# Patient Record
Sex: Female | Born: 1980 | Race: White | Hispanic: No | Marital: Married | Smoking: Former smoker
Health system: Southern US, Community
[De-identification: ages and names within clinical notes are randomized; demographics above are authoritative.]

## PROBLEM LIST (undated history)

## (undated) DIAGNOSIS — F4323 Adjustment disorder with mixed anxiety and depressed mood: Secondary | ICD-10-CM

## (undated) DIAGNOSIS — F32A Depression, unspecified: Secondary | ICD-10-CM

## (undated) DIAGNOSIS — D649 Anemia, unspecified: Secondary | ICD-10-CM

## (undated) DIAGNOSIS — E079 Disorder of thyroid, unspecified: Secondary | ICD-10-CM

## (undated) DIAGNOSIS — J45909 Unspecified asthma, uncomplicated: Secondary | ICD-10-CM

## (undated) DIAGNOSIS — E039 Hypothyroidism, unspecified: Secondary | ICD-10-CM

## (undated) DIAGNOSIS — O99019 Anemia complicating pregnancy, unspecified trimester: Secondary | ICD-10-CM

## (undated) DIAGNOSIS — F329 Major depressive disorder, single episode, unspecified: Secondary | ICD-10-CM

## (undated) DIAGNOSIS — O99283 Endocrine, nutritional and metabolic diseases complicating pregnancy, third trimester: Secondary | ICD-10-CM

## (undated) DIAGNOSIS — Z872 Personal history of diseases of the skin and subcutaneous tissue: Secondary | ICD-10-CM

## (undated) DIAGNOSIS — O411290 Chorioamnionitis, unspecified trimester, not applicable or unspecified: Secondary | ICD-10-CM

## (undated) HISTORY — DX: Anemia, unspecified: D64.9

## (undated) HISTORY — DX: Disorder of thyroid, unspecified: E07.9

## (undated) HISTORY — DX: Endocrine, nutritional and metabolic diseases complicating pregnancy, third trimester: O99.283

---
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
## 2004-03-22 ENCOUNTER — Emergency Department (HOSPITAL_COMMUNITY): Admission: EM | Admit: 2004-03-22 | Discharge: 2004-03-23 | Payer: Self-pay | Admitting: Emergency Medicine

## 2006-02-05 ENCOUNTER — Emergency Department (HOSPITAL_COMMUNITY): Admission: EM | Admit: 2006-02-05 | Discharge: 2006-02-06 | Payer: Self-pay | Admitting: Emergency Medicine

## 2007-06-25 ENCOUNTER — Other Ambulatory Visit: Admission: RE | Admit: 2007-06-25 | Discharge: 2007-06-25 | Payer: Self-pay | Admitting: Obstetrics and Gynecology

## 2012-02-14 ENCOUNTER — Emergency Department (HOSPITAL_COMMUNITY): Payer: 59

## 2012-02-14 ENCOUNTER — Emergency Department (HOSPITAL_COMMUNITY)
Admission: EM | Admit: 2012-02-14 | Discharge: 2012-02-15 | Disposition: A | Discharge status: Home / Self Care | Payer: 59 | Attending: Emergency Medicine | Admitting: Emergency Medicine

## 2012-02-14 ENCOUNTER — Encounter (HOSPITAL_COMMUNITY): Payer: Self-pay | Admitting: Emergency Medicine

## 2012-02-14 DIAGNOSIS — F17200 Nicotine dependence, unspecified, uncomplicated: Secondary | ICD-10-CM | POA: Insufficient documentation

## 2012-02-14 DIAGNOSIS — Z79899 Other long term (current) drug therapy: Secondary | ICD-10-CM | POA: Insufficient documentation

## 2012-02-14 DIAGNOSIS — Z3202 Encounter for pregnancy test, result negative: Secondary | ICD-10-CM | POA: Insufficient documentation

## 2012-02-14 DIAGNOSIS — J45909 Unspecified asthma, uncomplicated: Secondary | ICD-10-CM | POA: Insufficient documentation

## 2012-02-14 DIAGNOSIS — R109 Unspecified abdominal pain: Secondary | ICD-10-CM

## 2012-02-14 DIAGNOSIS — F329 Major depressive disorder, single episode, unspecified: Secondary | ICD-10-CM | POA: Insufficient documentation

## 2012-02-14 DIAGNOSIS — F3289 Other specified depressive episodes: Secondary | ICD-10-CM | POA: Insufficient documentation

## 2012-02-14 DIAGNOSIS — R1031 Right lower quadrant pain: Secondary | ICD-10-CM | POA: Insufficient documentation

## 2012-02-14 HISTORY — DX: Depression, unspecified: F32.A

## 2012-02-14 HISTORY — DX: Unspecified asthma, uncomplicated: J45.909

## 2012-02-14 HISTORY — DX: Major depressive disorder, single episode, unspecified: F32.9

## 2012-02-14 LAB — URINALYSIS, MICROSCOPIC ONLY
Glucose, UA: NEGATIVE mg/dL
Leukocytes, UA: NEGATIVE
Nitrite: NEGATIVE
Specific Gravity, Urine: 1.009 (ref 1.005–1.030)
pH: 6.5 (ref 5.0–8.0)

## 2012-02-14 LAB — CBC WITH DIFFERENTIAL/PLATELET
Eosinophils Relative: 1 % (ref 0–5)
HCT: 39.7 % (ref 36.0–46.0)
MCHC: 33.8 g/dL (ref 30.0–36.0)
Monocytes Absolute: 0.8 10*3/uL (ref 0.1–1.0)
Neutro Abs: 5.5 10*3/uL (ref 1.7–7.7)
RDW: 12.1 % (ref 11.5–15.5)

## 2012-02-14 LAB — BASIC METABOLIC PANEL
BUN: 18 mg/dL (ref 6–23)
Calcium: 9 mg/dL (ref 8.4–10.5)
Creatinine, Ser: 0.64 mg/dL (ref 0.50–1.10)
GFR calc Af Amer: >90 mL/min (ref >90)
GFR calc non Af Amer: >90 mL/min (ref >90)

## 2012-02-14 LAB — WET PREP, GENITAL: Yeast Wet Prep HPF POC: NONE SEEN

## 2012-02-14 LAB — LIPASE, BLOOD: Lipase: 28 U/L (ref 11–59)

## 2012-02-14 LAB — POCT PREGNANCY, URINE: Preg Test, Ur: NEGATIVE

## 2012-02-14 MED ORDER — SODIUM CHLORIDE 0.9 % IV BOLUS (SEPSIS)
500.0000 mL | Freq: Once | INTRAVENOUS | Status: AC
  Administered 2012-02-14: 500 mL via INTRAVENOUS

## 2012-02-14 MED ORDER — SODIUM CHLORIDE 0.9 % IV SOLN
INTRAVENOUS | Status: DC
  Administered 2012-02-15: via INTRAVENOUS

## 2012-02-14 NOTE — ED Provider Notes (Signed)
History     CSN: [id number]  Arrival date & time 02/14/12  2006   First MD Initiated Contact with Patient 02/14/12 2128      Chief Complaint  Patient presents with  . Abdominal Pain    (Consider location/radiation/quality/duration/timing/severity/associated sxs/prior treatment) The history is provided by the patient.   patient presents with abdominal pain that began 2:00 today. It is dull. No dysuria. She was seen in urgent care. She was told that her urine did not show anything. She was not sent with paperwork. She was tender in her right lower abdomen at the time. She still somewhat tender but is improved. No fevers. No change in appetite. No diarrhea constipation. No nausea vomiting. Patient's last period was a week ago. She had not had periods for a while for that because she was on a double shot. She denies pregnancy but also states it is a possibility.   Past Medical History  Diagnosis Date  . Asthma   . Depression     Past Surgical History  Procedure Date  . Hernia repair   . Tonsillectomy     No family history on file.  History  Substance Use Topics  . Smoking status: Current Some Day Smoker  . Smokeless tobacco: Not on file  . Alcohol Use: No    OB History    Grav Para Term Preterm Abortions TAB SAB Ect Mult Living                  Review of Systems  Constitutional: Negative for activity change and appetite change.  HENT: Negative for neck stiffness.   Eyes: Negative for pain.  Respiratory: Negative for chest tightness and shortness of breath.   Cardiovascular: Negative for chest pain and leg swelling.  Gastrointestinal: Positive for abdominal pain. Negative for nausea, vomiting and diarrhea.  Genitourinary: Negative for flank pain and vaginal discharge.  Musculoskeletal: Negative for back pain.  Skin: Negative for rash.  Neurological: Negative for weakness, numbness and headaches.  Psychiatric/Behavioral: Negative for behavioral problems.     Allergies  Review of patient's allergies indicates no known allergies.  Home Medications   Current Outpatient Rx  Name  Route  Sig  Dispense  Refill  . CETIRIZINE HCL 10 MG PO TABS   Oral   Take 10 mg by mouth daily.         [name] Kitchen ESCITALOPRAM OXALATE 20 MG PO TABS   Oral   Take 20 mg by mouth daily.         . IBUPROFEN 200 MG PO TABS   Oral   Take 800 mg by mouth every 6 (six) hours as needed. Pain         . MONTELUKAST SODIUM 10 MG PO TABS   Oral   Take 10 mg by mouth at bedtime.         Azzie Roup ACE-ETH ESTRAD-FE 1-20 MG-MCG PO TABS   Oral   Take 1 tablet by mouth daily.           BP 118/71  Pulse 98  Temp 98 F (36.7 C) (Oral)  Resp 16  Wt 150 lb (68.04 kg)  SpO2 99%  LMP 02/05/2012  Physical Exam  Nursing note and vitals reviewed. Constitutional: She is oriented to person, place, and time. She appears well-developed and well-nourished.  HENT:  Head: Normocephalic and atraumatic.  Eyes: EOM are normal. Pupils are equal, round, and reactive to light.  Neck: Normal range of motion. Neck supple.  Cardiovascular: Normal rate, regular rhythm and normal heart sounds.   No murmur heard. Pulmonary/Chest: Effort normal and breath sounds normal. No respiratory distress. She has no wheezes. She has no rales.  Abdominal: Soft. Bowel sounds are normal. She exhibits no distension. There is tenderness. There is no rebound and no guarding.       Mild lower abdominal tenderness. No rebound or guarding.  Musculoskeletal: Normal range of motion.  Neurological: She is alert and oriented to person, place, and time. No cranial nerve deficit.  Skin: Skin is warm and dry.  Psychiatric: She has a normal mood and affect. Her speech is normal.  pelvic exam showed no CMT. Mild white vaginal discharge.   ED Course  Procedures (including critical care time)  Labs Reviewed  URINALYSIS, MICROSCOPIC ONLY - Abnormal; Notable for the following:    Hgb urine dipstick TRACE  (*)     Ketones, ur TRACE (*)     Squamous Epithelial / LPF FEW (*)     All other components within normal limits  BASIC METABOLIC PANEL - Abnormal; Notable for the following:    Sodium 132 (*)     All other components within normal limits  WET PREP, GENITAL - Abnormal; Notable for the following:    WBC, Wet Prep HPF POC MANY (*)     All other components within normal limits  CBC WITH DIFFERENTIAL  LIPASE, BLOOD  POCT PREGNANCY, URINE  GC/CHLAMYDIA PROBE AMP   No results found.   No diagnosis found.    MDM  She has right lower abdominal pain that started today. Seen in urgent care and sent here to rule out appendicitis. Laboratories overall reassuring. Patient continues tender. CT scan will be done.         Juliet Rude. Rubin Payor, MD 02/14/12 [phone]

## 2012-02-14 NOTE — ED Notes (Signed)
Patient seen at urgent care at @1900 , was sent here by provider. Patient c/o lower back pain, nausea, abd tenderness. Provider expressed concern over appendicitis or ovarian cysts. Patient was given 2 small white pills and was asked to place them under her tongue (Zofran ODT?) for nausea. Patient reports still having intermittent nausea at this time.

## 2012-02-14 NOTE — ED Notes (Signed)
Pelvic is set up 

## 2012-02-14 NOTE — ED Notes (Signed)
Pt alert, arrives from Urgent Care, c/o lower abd pain, ? Appendicitis, states onset today, pt ? UTI s/s,  resp even unlabored, skin pwd

## 2012-02-15 MED ORDER — IBUPROFEN 800 MG PO TABS: 800.0000 mg | ORAL_TABLET | Freq: Three times a day (TID) | ORAL | Status: DC

## 2012-02-15 MED ORDER — IOHEXOL 300 MG/ML  SOLN
100.0000 mL | Freq: Once | INTRAMUSCULAR | Status: AC | PRN
  Administered 2012-02-15: 100 mL via INTRAVENOUS

## 2012-02-15 NOTE — ED Notes (Signed)
Discharge instructions reviewed. Rx given x1. All questions answered. 

## 2012-02-15 NOTE — ED Notes (Signed)
Patient has completed contrast, CT notified.

## 2012-02-20 LAB — HM PAP SMEAR

## 2012-08-07 ENCOUNTER — Emergency Department (HOSPITAL_COMMUNITY)
Admission: EM | Admit: 2012-08-07 | Discharge: 2012-08-08 | Disposition: A | Discharge status: Home / Self Care | Payer: 59 | Attending: Emergency Medicine | Admitting: Emergency Medicine

## 2012-08-07 ENCOUNTER — Encounter (HOSPITAL_COMMUNITY): Payer: Self-pay | Admitting: Emergency Medicine

## 2012-08-07 DIAGNOSIS — S61219A Laceration without foreign body of unspecified finger without damage to nail, initial encounter: Secondary | ICD-10-CM

## 2012-08-07 DIAGNOSIS — S61209A Unspecified open wound of unspecified finger without damage to nail, initial encounter: Secondary | ICD-10-CM | POA: Insufficient documentation

## 2012-08-07 DIAGNOSIS — F3289 Other specified depressive episodes: Secondary | ICD-10-CM | POA: Insufficient documentation

## 2012-08-07 DIAGNOSIS — W260XXA Contact with knife, initial encounter: Secondary | ICD-10-CM | POA: Insufficient documentation

## 2012-08-07 DIAGNOSIS — Z79899 Other long term (current) drug therapy: Secondary | ICD-10-CM | POA: Insufficient documentation

## 2012-08-07 DIAGNOSIS — J45909 Unspecified asthma, uncomplicated: Secondary | ICD-10-CM | POA: Insufficient documentation

## 2012-08-07 DIAGNOSIS — F329 Major depressive disorder, single episode, unspecified: Secondary | ICD-10-CM | POA: Insufficient documentation

## 2012-08-07 DIAGNOSIS — Y990 Civilian activity done for income or pay: Secondary | ICD-10-CM | POA: Insufficient documentation

## 2012-08-07 DIAGNOSIS — Z23 Encounter for immunization: Secondary | ICD-10-CM | POA: Insufficient documentation

## 2012-08-07 DIAGNOSIS — Y9389 Activity, other specified: Secondary | ICD-10-CM | POA: Insufficient documentation

## 2012-08-07 DIAGNOSIS — Y929 Unspecified place or not applicable: Secondary | ICD-10-CM | POA: Insufficient documentation

## 2012-08-07 DIAGNOSIS — W261XXA Contact with sword or dagger, initial encounter: Secondary | ICD-10-CM | POA: Insufficient documentation

## 2012-08-07 DIAGNOSIS — F17200 Nicotine dependence, unspecified, uncomplicated: Secondary | ICD-10-CM | POA: Insufficient documentation

## 2012-08-07 MED ORDER — TETANUS-DIPHTH-ACELL PERTUSSIS 5-2.5-18.5 LF-MCG/0.5 IM SUSP
0.5000 mL | Freq: Once | INTRAMUSCULAR | Status: AC
  Administered 2012-08-08: 0.5 mL via INTRAMUSCULAR
  Filled 2012-08-07: qty 0.5

## 2012-08-07 NOTE — ED Notes (Signed)
Pt c/o laceration to L middle finger, pt states she accidentally cut self with instrument at work @ 1400 today, minimal bleeding noted.

## 2012-08-07 NOTE — ED Provider Notes (Signed)
History    This chart was scribed for a non-physician practitioner, Roxy Horseman, PA-C, working with Olivia Mackie, MD by Frederik Pear, ED Scribe. This patient was seen in room WTR7/WTR7 and the patient's care was started at 2314.   CSN: [id number]  Arrival date & time 08/07/12  2257   First MD Initiated Contact with Patient 08/07/12 2314      Chief Complaint  Patient presents with  . Laceration    (Consider location/radiation/quality/duration/timing/severity/associated sxs/prior treatment) The history is provided by the patient and medical records. No language interpreter was used.    HPI Comments: Megan Lara is a 32 y.o. female who presents to the Emergency Department complaining of a laceration to the left middle finger that occurred at 1400 when she slipped while using a blade for cutting paraffin while at work. In ED, bleeding is controlled. She denies any other associated symptoms. She reports that her last tetanus was more than 5 years ago.  Past Medical History  Diagnosis Date  . Asthma   . Depression     Past Surgical History  Procedure Laterality Date  . Hernia repair    . Tonsillectomy      No family history on file.  History  Substance Use Topics  . Smoking status: Current Some Day Smoker  . Smokeless tobacco: Not on file  . Alcohol Use: Yes     Comment: occ    OB History   Grav Para Term Preterm Abortions TAB SAB Ect Mult Living                  Review of Systems A complete 10 system review of systems was obtained and all systems are negative except as noted in the HPI and PMH.  Allergies  Review of patient's allergies indicates no known allergies.  Home Medications   Current Outpatient Rx  Name  Route  Sig  Dispense  Refill  . cetirizine (ZYRTEC) 10 MG tablet   Oral   Take 10 mg by mouth daily.         [name] Kitchen escitalopram (LEXAPRO) 20 MG tablet   Oral   Take 20 mg by mouth daily.         [name] Kitchen ibuprofen (ADVIL,MOTRIN) 200 MG tablet  Oral   Take 800 mg by mouth every 6 (six) hours as needed. Pain         . ibuprofen (ADVIL,MOTRIN) 800 MG tablet   Oral   Take 1 tablet (800 mg total) by mouth 3 (three) times daily.   21 tablet   0   . montelukast (SINGULAIR) 10 MG tablet   Oral   Take 10 mg by mouth at bedtime.         . norethindrone-ethinyl estradiol (JUNEL FE,GILDESS FE,LOESTRIN FE) 1-20 MG-MCG tablet   Oral   Take 1 tablet by mouth daily.           BP 128/77  Pulse 109  Temp(Src) 98.7 F (37.1 C) (Oral)  Resp 16  Wt 135 lb (61.236 kg)  SpO2 99%  LMP 07/07/2012  Physical Exam  Nursing note and vitals reviewed. Constitutional: She is oriented to person, place, and time. She appears well-developed and well-nourished. No distress.  HENT:  Head: Normocephalic and atraumatic.  Eyes: EOM are normal. Pupils are equal, round, and reactive to light.  Neck: Normal range of motion. Neck supple. No tracheal deviation present.  Cardiovascular: Normal rate.   Pulmonary/Chest: Effort normal. No respiratory distress.  Abdominal: Soft. She  exhibits no distension.  Musculoskeletal: Normal range of motion. She exhibits no edema.  Neurological: She is alert and oriented to person, place, and time.  Skin: Skin is warm and dry. Laceration noted.  0.5 cm very shallow, hemostatic laceration to the distal aspect o the palmar surface of the left third digit. No foreign bodies. No tendon or ligament involvement.  Psychiatric: She has a normal mood and affect. Her behavior is normal.    ED Course  Procedures (including critical care time)  DIAGNOSTIC STUDIES: Oxygen Saturation is 99% on room air, normal by my interpretation.    COORDINATION OF CARE:  23:35- Discussed planned course of treatment with the patient, including updating her tetanus and repairing the laceration with Dermabond, who is agreeable at this time.  23:45- Medication Orders- TDaP (boostrix) injection 0.5 ml- once.  Labs Reviewed - No data to  display No results found. LACERATION REPAIR Performed by: Roxy Horseman Authorized by: Roxy Horseman Consent: Verbal consent obtained. Risks and benefits: risks, benefits and alternatives were discussed Consent given by: patient Patient identity confirmed: provided demographic data Prepped and Draped in normal sterile fashion Wound explored  Laceration Location: Left middle finger  Laceration Length: 0.5 cm  No Foreign Bodies seen or palpated  Anesthesia: None   Local anesthetic: None   Anesthetic total: None   Irrigation method: syringe Amount of cleaning: standard  Skin closure: Dermabond   Number of sutures: Dermabond   Technique: Dermabond   Patient tolerance: Patient tolerated the procedure well with no immediate complications.   1. Finger laceration, initial encounter       MDM  Patient with uncomplicated laceration. Repaired with Dermabond. Tetanus is updated. Patient is stable and ready for discharge.  I personally performed the services described in this documentation, which was scribed in my presence. The recorded information has been reviewed and is accurate.         Roxy Horseman, PA-C 08/08/12 0015

## 2012-08-08 NOTE — ED Provider Notes (Signed)
Medical screening examination/treatment/procedure(s) were performed by non-physician practitioner and as supervising physician I was immediately available for consultation/collaboration.  Olivia Mackie, MD 08/08/12 [phone]

## 2012-09-12 ENCOUNTER — Emergency Department (HOSPITAL_COMMUNITY)
Admission: EM | Admit: 2012-09-12 | Discharge: 2012-09-12 | Disposition: A | Discharge status: Home / Self Care | Payer: 59 | Attending: Emergency Medicine | Admitting: Emergency Medicine

## 2012-09-12 ENCOUNTER — Other Ambulatory Visit: Payer: Self-pay

## 2012-09-12 ENCOUNTER — Encounter (HOSPITAL_COMMUNITY): Payer: Self-pay | Admitting: Emergency Medicine

## 2012-09-12 ENCOUNTER — Emergency Department (HOSPITAL_COMMUNITY): Payer: 59

## 2012-09-12 DIAGNOSIS — F3289 Other specified depressive episodes: Secondary | ICD-10-CM | POA: Insufficient documentation

## 2012-09-12 DIAGNOSIS — Z79899 Other long term (current) drug therapy: Secondary | ICD-10-CM | POA: Insufficient documentation

## 2012-09-12 DIAGNOSIS — F17200 Nicotine dependence, unspecified, uncomplicated: Secondary | ICD-10-CM | POA: Insufficient documentation

## 2012-09-12 DIAGNOSIS — Z3202 Encounter for pregnancy test, result negative: Secondary | ICD-10-CM | POA: Insufficient documentation

## 2012-09-12 DIAGNOSIS — R071 Chest pain on breathing: Secondary | ICD-10-CM | POA: Insufficient documentation

## 2012-09-12 DIAGNOSIS — J45909 Unspecified asthma, uncomplicated: Secondary | ICD-10-CM | POA: Insufficient documentation

## 2012-09-12 DIAGNOSIS — F329 Major depressive disorder, single episode, unspecified: Secondary | ICD-10-CM | POA: Insufficient documentation

## 2012-09-12 DIAGNOSIS — R42 Dizziness and giddiness: Secondary | ICD-10-CM | POA: Insufficient documentation

## 2012-09-12 DIAGNOSIS — R0789 Other chest pain: Secondary | ICD-10-CM

## 2012-09-12 LAB — URINALYSIS, ROUTINE W REFLEX MICROSCOPIC
Ketones, ur: NEGATIVE mg/dL
Leukocytes, UA: NEGATIVE
Nitrite: NEGATIVE
Protein, ur: NEGATIVE mg/dL
Urobilinogen, UA: 0.2 mg/dL (ref 0.0–1.0)

## 2012-09-12 LAB — COMPREHENSIVE METABOLIC PANEL
Albumin: 4 g/dL (ref 3.5–5.2)
Alkaline Phosphatase: 59 U/L (ref 39–117)
BUN: 12 mg/dL (ref 6–23)
Chloride: 101 mEq/L (ref 96–112)
Creatinine, Ser: 0.64 mg/dL (ref 0.50–1.10)
GFR calc Af Amer: >90 mL/min (ref >90)
GFR calc non Af Amer: >90 mL/min (ref >90)
Glucose, Bld: 91 mg/dL (ref 70–99)
Potassium: 3.8 mEq/L (ref 3.5–5.1)
Total Bilirubin: 0.4 mg/dL (ref 0.3–1.2)

## 2012-09-12 LAB — CBC
HCT: 42.5 % (ref 36.0–46.0)
Hemoglobin: 14.2 g/dL (ref 12.0–15.0)
MCV: 95.7 fL (ref 78.0–100.0)
RDW: 12.6 % (ref 11.5–15.5)
WBC: 9.1 10*3/uL (ref 4.0–10.5)

## 2012-09-12 LAB — POCT PREGNANCY, URINE: Preg Test, Ur: NEGATIVE

## 2012-09-12 MED ORDER — OMEPRAZOLE 20 MG PO CPDR: 20.0000 mg | DELAYED_RELEASE_CAPSULE | Freq: Every day | ORAL | Status: DC

## 2012-09-12 MED ORDER — OXYCODONE-ACETAMINOPHEN 5-325 MG PO TABS
2.0000 | ORAL_TABLET | Freq: Once | ORAL | Status: AC
  Administered 2012-09-12: 2 via ORAL
  Filled 2012-09-12: qty 2

## 2012-09-12 MED ORDER — SODIUM CHLORIDE 0.9 % IV SOLN
Freq: Once | INTRAVENOUS | Status: AC
  Administered 2012-09-12: 17:00:00 via INTRAVENOUS

## 2012-09-12 MED ORDER — ALBUTEROL SULFATE (5 MG/ML) 0.5% IN NEBU
5.0000 mg | INHALATION_SOLUTION | Freq: Once | RESPIRATORY_TRACT | Status: AC
  Administered 2012-09-12: 5 mg via RESPIRATORY_TRACT
  Filled 2012-09-12: qty 1

## 2012-09-12 NOTE — ED Notes (Signed)
Patient was educated not to drive, operate heavy machinery, or drink alcohol while taking narcotic medication.  

## 2012-09-12 NOTE — ED Notes (Signed)
Patient presents with left chest pressure pain, dizziness, lightheadedness. Denies N/V, SOB, diaphoresis, blurred vision. Patient smokes cigarettes, no other history.

## 2012-09-12 NOTE — ED Provider Notes (Signed)
History    CSN: [id number] Arrival date & time 09/12/12  1633  First MD Initiated Contact with Patient 09/12/12 1636     Chief Complaint  Patient presents with  . Chest Pain   (Consider location/radiation/quality/duration/timing/severity/associated sxs/prior Treatment) The history is provided by the patient and medical records. No language interpreter was used.    Megan Lara is a 32 y.o. female  with a hx of asthma presents to the Emergency Department complaining of gradual, persistent, progressively worsening chest pain beginning 1 week ago.  Pain is located in the L upper chest and described as pressure, rated 6/10 and does not radiate.  Pt states she saw her PCP yesterday with a normal ECG and was dx with possible costochondritis and given anti-inflammatories. Pt has a hx of anxiety but this pain is different.  Pt states the pain has been intermittent over the last week. Associated symptoms include lightheadedness and dizziness beginning this afternoon.  Nothing makes it better and nothing makes it worse.  Pt denies fever, chills, diaphoresis, headache, neck pain, SOB, abd pain, N/V/D, weakness, dysuria, hematuria.  Pt is a a current everyday smoker at 0.5pack per day.  Pt takes symbicort and albuterol for her asthma and has tried that at the beginning of the week without relief. Pt denies Hx of CAD and does not know about a family hx.  Denies recent surgeries, travel, exogenous estrogen or immobilization.  LMP 08/19/2012   Past Medical History  Diagnosis Date  . Asthma   . Depression    Past Surgical History  Procedure Laterality Date  . Hernia repair    . Tonsillectomy     History reviewed. No pertinent family history. History  Substance Use Topics  . Smoking status: Current Every Day Smoker -- 0.50 packs/day  . Smokeless tobacco: Not on file  . Alcohol Use: Yes     Comment: occ   OB History   Grav Para Term Preterm Abortions TAB SAB Ect Mult Living                  Review of Systems  Constitutional: Negative for fever, diaphoresis, appetite change, fatigue and unexpected weight change.  HENT: Negative for mouth sores and neck stiffness.   Eyes: Negative for visual disturbance.  Respiratory: Negative for cough, chest tightness, shortness of breath and wheezing.   Cardiovascular: Positive for chest pain.  Gastrointestinal: Negative for nausea, vomiting, abdominal pain, diarrhea and constipation.  Endocrine: Negative for polydipsia, polyphagia and polyuria.  Genitourinary: Negative for dysuria, urgency, frequency and hematuria.  Musculoskeletal: Negative for back pain.  Skin: Negative for rash.  Allergic/Immunologic: Negative for immunocompromised state.  Neurological: Negative for syncope, light-headedness and headaches.  Hematological: Does not bruise/bleed easily.  Psychiatric/Behavioral: Negative for sleep disturbance. The patient is not nervous/anxious.     Allergies  Review of patient's allergies indicates no known allergies.  Home Medications   Current Outpatient Rx  Name  Route  Sig  Dispense  Refill  . albuterol (PROVENTIL HFA;VENTOLIN HFA) 108 (90 BASE) MCG/ACT inhaler   Inhalation   Inhale 2 puffs into the lungs every 6 (six) hours as needed for wheezing.         . budesonide-formoterol (SYMBICORT) 80-4.5 MCG/ACT inhaler   Inhalation   Inhale 2 puffs into the lungs 2 (two) times daily.         . cetirizine (ZYRTEC) 10 MG tablet   Oral   Take 10 mg by mouth daily.         [name] Kitchen  FLUoxetine (PROZAC) 40 MG capsule   Oral   Take 40 mg by mouth daily.         . Prenatal Vit-Fe Fumarate-FA (PRENATAL MULTIVITAMIN) TABS   Oral   Take 1 tablet by mouth daily at 12 noon.         [name] Kitchen omeprazole (PRILOSEC) 20 MG capsule   Oral   Take 1 capsule (20 mg total) by mouth daily.   30 capsule   0    BP 120/71  Pulse 84  Temp(Src) 98.6 F (37 C) (Oral)  Resp 12  SpO2 100%  LMP 08/19/2012 Physical Exam  Nursing note and  vitals reviewed. Constitutional: She is oriented to person, place, and time. She appears well-developed and well-nourished. No distress.  Awake, alert, nontoxic appearance  HENT:  Head: Normocephalic and atraumatic.  Right Ear: Tympanic membrane, external ear and ear canal normal.  Left Ear: Tympanic membrane, external ear and ear canal normal.  Nose: Nose normal. No mucosal edema or rhinorrhea.  Mouth/Throat: Uvula is midline, oropharynx is clear and moist and mucous membranes are normal. Mucous membranes are not dry. No dental abscesses or edematous. No oropharyngeal exudate, posterior oropharyngeal edema, posterior oropharyngeal erythema or tonsillar abscesses.  Eyes: Conjunctivae and EOM are normal. Pupils are equal, round, and reactive to light. No scleral icterus.  No nystagmus  Neck: Normal range of motion. Neck supple.  Cardiovascular: Normal rate, regular rhythm, normal heart sounds and intact distal pulses.   No murmur heard. Pulmonary/Chest: Effort normal and breath sounds normal. No respiratory distress. She has no wheezes. She has no rales. She exhibits no tenderness.  Coarse breath sounds throughout  Abdominal: Soft. Bowel sounds are normal. She exhibits no distension and no mass. There is no tenderness. There is no rebound and no guarding.  Musculoskeletal: Normal range of motion. She exhibits no edema.  Lymphadenopathy:    She has no cervical adenopathy.  Neurological: She is alert and oriented to person, place, and time. She exhibits normal muscle tone. Coordination normal.  Speech is clear and goal oriented Moves extremities without ataxia Ambulates without difficulty  Skin: Skin is warm and dry. No rash noted. She is not diaphoretic. No erythema.  Psychiatric: She has a normal mood and affect.    ED Course  Procedures (including critical care time) Labs Reviewed  CBC  COMPREHENSIVE METABOLIC PANEL  URINALYSIS, ROUTINE W REFLEX MICROSCOPIC  TROPONIN I  POCT  PREGNANCY, URINE   Dg Chest 2 View  09/12/2012   *RADIOLOGY REPORT*  Clinical Data: Chest pain  CHEST - 2 VIEW  Comparison: 02/06/2006  Findings: Mild hyperinflation.  Mild prominence of the lung markings raising the possibility of asthma.  Negative for pneumonia or effusion.  Lungs are clear.  IMPRESSION: Findings consistent with asthma.  No acute cardiopulmonary abnormality.   Original Report Authenticated By: Janeece Riggers, M.D.   1. Atypical chest pain   2. Chest wall pain     ECG:  Date: 09/12/2012  Rate: 78  Rhythm: normal sinus rhythm  QRS Axis: normal  Intervals: normal  ST/T Wave abnormalities: normal  Conduction Disutrbances:none  Narrative Interpretation: nonischemic ECG, no old for comparison  Old EKG Reviewed: none available   MDM  Laurence Compton presents with 1 week of atypical chest pain. Will give fluids, check labs, ECG and CXR.  Pt resting comfortably.  Pt with coarse breath sounds without wheezing, but hx of asthma will given albuterol ned as well.    6:30PM Pt given albuterol treatment  without relief of chest pain.  Breath sounds clear and equal afterwards.  Pt ambulates to the bathroom with steady gait, no nystagmus on exam.  7:26 PM  Chest pain is not likely of cardiac or pulmonary etiology d/t presentation, perc negative, VSS, no tracheal deviation, no JVD or new murmur, RRR, breath sounds equal bilaterally, EKG without acute abnormalities, negative troponin, and negative CXR. Pt has been advised start a PPI, continue taking her anti-inflammatory medications as prescribed by the PCP and return to the ED if CP becomes exertional, associated with diaphoresis or nausea, radiates to left jaw/arm, worsens or becomes concerning in any way. Pt appears reliable for follow up and is agreeable to discharge.   Case has been discussed with Dr. Estell Harpin who agrees with the above plan to discharge.    Dierdre Forth, PA-C 09/12/12 1934

## 2012-09-12 NOTE — ED Provider Notes (Signed)
Medical screening examination/treatment/procedure(s) were performed by non-physician practitioner and as supervising physician I was immediately available for consultation/collaboration.    L , MD 09/12/12 2327 

## 2013-03-15 ENCOUNTER — Other Ambulatory Visit: Payer: Self-pay | Admitting: Nurse Practitioner

## 2013-03-15 NOTE — Telephone Encounter (Signed)
Per last ov on 04/14/12 pt to discontinue birth control & try for pregnancy. No aex scheduled. Last aex was 02/20/12. lmtcb to see if pt still taking ocp's & to schedule aex

## 2013-03-16 NOTE — Telephone Encounter (Signed)
Pt states she started her birth contol back. rx sent for 1mth with no refills. aex scheduled for 04-08-13 with PGrubb

## 2013-03-16 NOTE — Telephone Encounter (Signed)
Patient is calling joy back °

## 2013-04-07 ENCOUNTER — Encounter: Payer: Self-pay | Admitting: Nurse Practitioner

## 2013-04-08 ENCOUNTER — Encounter: Payer: Self-pay | Admitting: Nurse Practitioner

## 2013-04-08 ENCOUNTER — Ambulatory Visit (INDEPENDENT_AMBULATORY_CARE_PROVIDER_SITE_OTHER): Payer: 59 | Admitting: Nurse Practitioner

## 2013-04-08 VITALS — BP 108/64 | HR 72 | Ht 64.0 in | Wt 148.0 lb

## 2013-04-08 DIAGNOSIS — Z01419 Encounter for gynecological examination (general) (routine) without abnormal findings: Secondary | ICD-10-CM

## 2013-04-08 DIAGNOSIS — L918 Other hypertrophic disorders of the skin: Secondary | ICD-10-CM

## 2013-04-08 DIAGNOSIS — Z0000 Encounter for general adult medical examination without abnormal findings: Secondary | ICD-10-CM

## 2013-04-08 DIAGNOSIS — L909 Atrophic disorder of skin, unspecified: Secondary | ICD-10-CM

## 2013-04-08 DIAGNOSIS — L919 Hypertrophic disorder of the skin, unspecified: Secondary | ICD-10-CM

## 2013-04-08 LAB — POCT URINALYSIS DIPSTICK
Bilirubin, UA: NEGATIVE
GLUCOSE UA: NEGATIVE
Leukocytes, UA: NEGATIVE
NITRITE UA: NEGATIVE
Protein, UA: NEGATIVE
UROBILINOGEN UA: NEGATIVE
pH, UA: 5

## 2013-04-08 LAB — HEMOGLOBIN, FINGERSTICK: HEMOGLOBIN, FINGERSTICK: 13.8 g/dL (ref 12.0–16.0)

## 2013-04-08 MED ORDER — NORETHIN ACE-ETH ESTRAD-FE 1-20 MG-MCG PO TABS: ORAL_TABLET | ORAL | Status: DC

## 2013-04-08 NOTE — Patient Instructions (Signed)

## 2013-04-08 NOTE — Progress Notes (Addendum)
Patient ID: Megan Lara, female   DOB: 09-18-1980, 33 y.o.   MRN: [id number] 33 y.o. G0P0 Recently Separated X 2 weeks Caucasian Fe here for annual exam. Last year tried for pregnancy for 4 months.  Then went back on OCP in September.  Now menses at  4-5 days.  Moderate to light. She is having some sleep issues and stress with recent separation.  Wants to have STD's.  Patient's last menstrual period was 03/08/2013.          Sexually active: yes  The current method of family planning is OCP (estrogen/progesterone).    Exercising: no  The patient does not participate in regular exercise at present. Smoker:  no  Health Maintenance: Pap:  02/20/12, WNL TDaP:  08/08/12 Gardasil:  completed in 2009 Labs:  HB:  13.8 Urine:  Small RBC, 1+ ketones, pH 5.0 (pt spotting)   reports that she quit smoking about 5 weeks ago. Her smoking use included Cigarettes. She has a 3.5 pack-year smoking history. She has never used smokeless tobacco. She reports that she drinks alcohol. She reports that she does not use illicit drugs.  Past Medical History  Diagnosis Date  . Asthma   . Depression     Past Surgical History  Procedure Laterality Date  . Hernia repair Right 1991  . Tonsillectomy  1997    Current Outpatient Prescriptions  Medication Sig Dispense Refill  . albuterol (PROVENTIL HFA;VENTOLIN HFA) 108 (90 BASE) MCG/ACT inhaler Inhale 2 puffs into the lungs every 6 (six) hours as needed for wheezing.      . cetirizine (ZYRTEC) 10 MG tablet Take 10 mg by mouth daily.      [name] Kitchen. FLUoxetine (PROZAC) 40 MG capsule Take 80 mg by mouth daily.       . norethindrone-ethinyl estradiol (JUNEL FE 1/20) 1-20 MG-MCG tablet Take one tablet by mouth one time daily  3 Package  3  . budesonide-formoterol (SYMBICORT) 80-4.5 MCG/ACT inhaler Inhale 2 puffs into the lungs 2 (two) times daily.       No current facility-administered medications for this visit.    Family History  Problem Relation Age of Onset  .  Adopted: Yes  . Uterine cancer Maternal Grandmother     ROS:  Pertinent items are noted in HPI.  Otherwise, a comprehensive ROS was negative.  Exam:   BP 108/64  Pulse 72  Ht 5\' 4"  (1.626 m)  Wt 148 lb (67.132 kg)  BMI 25.39 kg/m2  LMP 03/08/2013 Height: 5\' 4"  (162.6 cm)  Ht Readings from Last 3 Encounters:  04/08/13 5\' 4"  (1.626 m)    General appearance: alert, cooperative and appears stated age Head: Normocephalic, without obvious abnormality, atraumatic Neck: no adenopathy, supple, symmetrical, trachea midline and thyroid normal to inspection and palpation Lungs: clear to auscultation bilaterally Breasts: normal appearance, no masses or tenderness Heart: regular rate and rhythm Abdomen: soft, non-tender; no masses,  no organomegaly Extremities: extremities normal, atraumatic, no cyanosis or edema Skin: Skin color, texture, turgor normal. No rashes or lesions Lymph nodes: Cervical, supraclavicular, and axillary nodes normal. No abnormal inguinal nodes palpated Neurologic: Grossly normal   Pelvic: External genitalia:  no lesions skin tag 2 inches from right groin that is getting larger and wants removed.              Urethra:  normal appearing urethra with no masses, tenderness or lesions              Bartholin's and Skene's: normal  Vagina: normal appearing vagina with normal color and discharge, no lesions              Cervix: anteverted              Pap taken: yes Bimanual Exam:  Uterus:  normal size, contour, position, consistency, mobility, non-tender              Adnexa: no mass, fullness, tenderness               Rectovaginal: Confirms               Anus:  normal sphincter tone, no lesions  A:  Well Woman with normal exam  OCP for Contraception  R/O STD's  R/O UTI  P:   Pap smear as per guidelines   Follow with STD's and urine C&S  Refill Junel 1/20 for a year  Counseled on breast self exam, adequate intake of calcium and vitamin D, diet and  exercise return annually or prn  An After Visit Summary was printed and given to the patient.

## 2013-04-09 LAB — STD PANEL
HIV: NONREACTIVE
Hepatitis B Surface Ag: NEGATIVE

## 2013-04-09 LAB — URINE CULTURE
Colony Count: NO GROWTH
Organism ID, Bacteria: NO GROWTH

## 2013-04-11 NOTE — Progress Notes (Signed)
Encounter reviewed by Dr.  Silva.  

## 2013-04-12 ENCOUNTER — Telehealth: Payer: Self-pay | Admitting: Obstetrics and Gynecology

## 2013-04-12 NOTE — Telephone Encounter (Signed)
Telephoned patient/ advised of PR: $130.49/ patient has decided to wait a while for the procedure due to the cost.//ssf

## 2013-04-12 NOTE — Telephone Encounter (Signed)
OK if patient declines skin tag removal. Will close encounter.  CC - Shirlyn GoltzPatty Grubb

## 2013-04-13 LAB — IPS N GONORRHOEA AND CHLAMYDIA BY PCR

## 2013-04-13 LAB — IPS PAP TEST WITH HPV

## 2013-06-02 HISTORY — PX: GANGLION CYST EXCISION: SHX1691

## 2013-11-17 ENCOUNTER — Encounter: Payer: Self-pay | Admitting: Certified Nurse Midwife

## 2013-11-17 ENCOUNTER — Telehealth: Payer: Self-pay | Admitting: Nurse Practitioner

## 2013-11-17 ENCOUNTER — Ambulatory Visit (INDEPENDENT_AMBULATORY_CARE_PROVIDER_SITE_OTHER): Payer: 59 | Admitting: Certified Nurse Midwife

## 2013-11-17 VITALS — BP 98/64 | HR 68 | Resp 16 | Ht 64.0 in | Wt 155.0 lb

## 2013-11-17 DIAGNOSIS — N760 Acute vaginitis: Secondary | ICD-10-CM

## 2013-11-17 MED ORDER — METRONIDAZOLE 500 MG PO TABS: 500.0000 mg | ORAL_TABLET | Freq: Two times a day (BID) | ORAL | Status: DC

## 2013-11-17 NOTE — Telephone Encounter (Signed)
Pt is having vaginal discharge/ odor/ and vaginal pain. Said that she tried OTC 3 weeks ago and it seemed to help but now it is back.

## 2013-11-17 NOTE — Telephone Encounter (Signed)
Left message to call Kaitlyn at [phone]. 

## 2013-11-17 NOTE — Telephone Encounter (Signed)
Spoke with patient. Patient states that 3 weeks ago she began to have "thick dark discharge with an odor." Used an OTC homeopathic treatment for yeast and BV. Patient then began to have vaginal itching which she used Vagisil for. Symptoms then went away. Patient's cycle has been "spotting for two weeks. I messed up my birth control pills this last month." States she was having "slight abdominal pain." "I can't tell if it was with my cycle or due to my other symptoms." Patient denies pain now. Vaginal odor has returned this morning. Patient requests appointment today after 1:30pm. Appointment scheduled for today at 2:30pm with Verner Chol CNM as Lauro Franklin, FNP is out of the office. Patient agreable to date and time.   Routing to Verner Chol CNM as covering Cc: Lauro Franklin, FNP  Routing to provider for final review. Patient agreeable to disposition. Will close encounter

## 2013-11-17 NOTE — Patient Instructions (Signed)
Bacterial Vaginosis Bacterial vaginosis is a vaginal infection that occurs when the normal balance of bacteria in the vagina is disrupted. It results from an overgrowth of certain bacteria. This is the most common vaginal infection in women of childbearing age. Treatment is important to prevent complications, especially in pregnant women, as it can cause a premature delivery. CAUSES  Bacterial vaginosis is caused by an increase in harmful bacteria that are normally present in smaller amounts in the vagina. Several different kinds of bacteria can cause bacterial vaginosis. However, the reason that the condition develops is not fully understood. RISK FACTORS Certain activities or behaviors can put you at an increased risk of developing bacterial vaginosis, including:  Having a new sex partner or multiple sex partners.  Douching.  Using an intrauterine device (IUD) for contraception. Women do not get bacterial vaginosis from toilet seats, bedding, swimming pools, or contact with objects around them. SIGNS AND SYMPTOMS  Some women with bacterial vaginosis have no signs or symptoms. Common symptoms include:  Grey vaginal discharge.  A fishlike odor with discharge, especially after sexual intercourse.  Itching or burning of the vagina and vulva.  Burning or pain with urination. DIAGNOSIS  Your health care provider will take a medical history and examine the vagina for signs of bacterial vaginosis. A sample of vaginal fluid may be taken. Your health care provider will look at this sample under a microscope to check for bacteria and abnormal cells. A vaginal pH test may also be done.  TREATMENT  Bacterial vaginosis may be treated with antibiotic medicines. These may be given in the form of a pill or a vaginal cream. A second round of antibiotics may be prescribed if the condition comes back after treatment.  HOME CARE INSTRUCTIONS   Only take over-the-counter or prescription medicines as  directed by your health care provider.  If antibiotic medicine was prescribed, take it as directed. Make sure you finish it even if you start to feel better.  Do not have sex until treatment is completed.  Tell all sexual partners that you have a vaginal infection. They should see their health care provider and be treated if they have problems, such as a mild rash or itching.  Practice safe sex by using condoms and only having one sex partner. SEEK MEDICAL CARE IF:   Your symptoms are not improving after 3 days of treatment.  You have increased discharge or pain.  You have a fever. MAKE SURE YOU:   Understand these instructions.  Will watch your condition.  Will get help right away if you are not doing well or get worse. FOR MORE INFORMATION  Centers for Disease Control and Prevention, Division of STD Prevention: www.cdc.gov/std American Sexual Health Association (ASHA): www.ashastd.org  Document Released: 02/18/2005 Document Revised: 12/09/2012 Document Reviewed: 09/30/2012 ExitCare Patient Information 2015 [employer]. This information is not intended to replace advice given to you by your health care provider. Make sure you discuss any questions you have with your health care provider.  

## 2013-11-17 NOTE — Progress Notes (Signed)
32 y.o.married white female g0p0 here with complaint of vaginal symptoms of itching, burning, and increase discharge. Describes discharge as watery with odor. Onset of symptoms 4-5 days ago. Denies new personal products, except used an OTC homeopathic medicine for vaginal problems,? Name. No STD concerns. Urinary symptoms none. Contraception OCP, no problems.  Megan Lara   O:Healthy female WDWN Affect: normal, orientation x 3  Exam: Abdomen: not tender Lymph node: no enlargement or tenderness Pelvic exam: External genital: normal, no lesions or redness BUS: negative Vagina: watery grey red odorous discharge noted. Ph:5.5   ,Wet prep taken Cervix: normal, non tender Uterus: normal, non tender Adnexa:normal, non tender, no masses or fullness noted   Wet Prep results: Clue cells   A:BV Contraception OCP   P:Discussed findings of BV and etiology. Discussed Aveeno or baking soda sitz bath for comfort. Avoid moist clothes or pads for extended period of time. If working out in gym clothes, change underwear after work out. Rx: Flagyl see order with instructions  Rv prn

## 2013-11-22 NOTE — Progress Notes (Signed)
Reviewed personally.  M. Suzanne , MD.  

## 2013-12-27 ENCOUNTER — Encounter: Payer: Self-pay | Admitting: Certified Nurse Midwife

## 2013-12-27 ENCOUNTER — Ambulatory Visit (INDEPENDENT_AMBULATORY_CARE_PROVIDER_SITE_OTHER): Payer: 59 | Admitting: Certified Nurse Midwife

## 2013-12-27 VITALS — BP 110/60 | HR 72 | Temp 98.1°F | Resp 18 | Wt 152.0 lb

## 2013-12-27 DIAGNOSIS — A499 Bacterial infection, unspecified: Secondary | ICD-10-CM

## 2013-12-27 DIAGNOSIS — B9689 Other specified bacterial agents as the cause of diseases classified elsewhere: Secondary | ICD-10-CM

## 2013-12-27 DIAGNOSIS — N760 Acute vaginitis: Secondary | ICD-10-CM

## 2013-12-27 DIAGNOSIS — N390 Urinary tract infection, site not specified: Secondary | ICD-10-CM

## 2013-12-27 MED ORDER — NITROFURANTOIN MONOHYD MACRO 100 MG PO CAPS: 100.0000 mg | ORAL_CAPSULE | Freq: Two times a day (BID) | ORAL | Status: DC

## 2013-12-27 MED ORDER — METRONIDAZOLE 0.75 % VA GEL: VAGINAL | Status: DC

## 2013-12-27 NOTE — Progress Notes (Signed)
33 y.o. married white g0p0 here with complaint of UTI, with onset  4 days ago. Patient complaining of urinary frequency/urgency/ and pain with urination. Patient denies fever, chills, nausea or back pain. No new personal products. Patient feels not related to sexual activity. Also having vaginal symptoms of vaginal itching and burning and slight increase of discharge. Contraception is OCP.    O: Healthy female WDWN Affect: Normal, orientation x 3 Skin : warm and dry CVAT: negative bilateral Abdomen: positive for suprapubic tenderness  Pelvic exam: External genital area: normal, no lesions Bladder,Urethra, Urethral meatus  All tender Vagina: watery grey pink odorous vaginal discharge, normal appearance  Wet prep taken ph 5.0 Cervix: normal, non tender Uterus:normal,non tender Adnexa: normal non tender, no fullness or masses   A: UTI BV  P: Reviewed findings of UTI and need for treatment.  ZO:XWRUEAVWRx:Macrobid see order. UJW:JXBJYLab:Urine micro, culture Reviewed warning signs and symptoms of UTI and need for increase in water intake. Encouraged to limit soda, tea, and coffee RV 2 week if TOC needed for positive culture  Reviewed findings of BV and appears to be chronic looking at frequency of occurrence. Discussed treatment with Metrogel and then one applicator after next 3 periods only and then advise if of status. Patient agreeable.  RV prn

## 2013-12-27 NOTE — Patient Instructions (Signed)

## 2013-12-28 LAB — URINALYSIS, MICROSCOPIC ONLY
BACTERIA UA: NONE SEEN
CRYSTALS: NONE SEEN
Casts: NONE SEEN
SQUAMOUS EPITHELIAL / LPF: NONE SEEN

## 2013-12-28 NOTE — Progress Notes (Signed)
Reviewed personally.  M. Suzanne , MD.  

## 2014-01-01 LAB — URINE CULTURE

## 2014-01-03 ENCOUNTER — Telehealth: Payer: Self-pay | Admitting: *Deleted

## 2014-01-03 NOTE — Telephone Encounter (Signed)
Left Message To Call Back  

## 2014-01-03 NOTE — Telephone Encounter (Signed)
-----   Message from Lauro FranklinPatricia Rolen-Grubb, FNP sent at 01/02/2014  5:43 PM EST ----- Please let patient know that urine culture was + and she is on correct antibiotic.  She does need TOC in 2 weeks.

## 2014-01-03 NOTE — Telephone Encounter (Signed)
Patient notified see result note 

## 2014-01-17 ENCOUNTER — Ambulatory Visit (INDEPENDENT_AMBULATORY_CARE_PROVIDER_SITE_OTHER): Payer: 59 | Admitting: *Deleted

## 2014-01-17 VITALS — BP 116/60 | HR 72 | Resp 14 | Ht 64.0 in | Wt 152.0 lb

## 2014-01-17 DIAGNOSIS — N390 Urinary tract infection, site not specified: Secondary | ICD-10-CM

## 2014-01-17 NOTE — Progress Notes (Signed)
Patient is here for urine TOC  Patient states she is feeling better, aware that someone will call her when results are in.  Urine culture sent off to the lab.  Routed to provider for review, encounter closed.

## 2014-01-18 LAB — URINE CULTURE
Colony Count: NO GROWTH
ORGANISM ID, BACTERIA: NO GROWTH

## 2014-01-19 ENCOUNTER — Telehealth: Payer: Self-pay

## 2014-01-19 NOTE — Telephone Encounter (Signed)
Patient notified of results. See lab 

## 2014-01-19 NOTE — Telephone Encounter (Signed)
-----   Message from Verner Choleborah S Leonard, CNM sent at 01/18/2014  5:34 PM EST ----- Notify urine culture negative

## 2014-01-19 NOTE — Telephone Encounter (Signed)
Unable to leave message. Try again. 

## 2014-03-17 ENCOUNTER — Other Ambulatory Visit: Payer: Self-pay | Admitting: *Deleted

## 2014-03-17 MED ORDER — NORETHIN ACE-ETH ESTRAD-FE 1-20 MG-MCG PO TABS: ORAL_TABLET | ORAL | Status: DC

## 2014-03-17 NOTE — Telephone Encounter (Signed)
Medication refill request: Junel 1/20  Last AEX: 04/08/13 with Ms. Patty  Next AEX: 04/12/14 with Ms. Patty Last MMG (if hormonal medication request): N/A Refill authorized: #3 packs/0 rfs, please advise.

## 2014-04-12 ENCOUNTER — Ambulatory Visit (INDEPENDENT_AMBULATORY_CARE_PROVIDER_SITE_OTHER): Payer: 59 | Admitting: Nurse Practitioner

## 2014-04-12 ENCOUNTER — Encounter: Payer: Self-pay | Admitting: Nurse Practitioner

## 2014-04-12 VITALS — BP 102/60 | HR 80 | Resp 16 | Ht 64.0 in | Wt 151.0 lb

## 2014-04-12 DIAGNOSIS — Z0000 Encounter for general adult medical examination without abnormal findings: Secondary | ICD-10-CM

## 2014-04-12 DIAGNOSIS — N912 Amenorrhea, unspecified: Secondary | ICD-10-CM

## 2014-04-12 DIAGNOSIS — Z01419 Encounter for gynecological examination (general) (routine) without abnormal findings: Secondary | ICD-10-CM

## 2014-04-12 DIAGNOSIS — R319 Hematuria, unspecified: Secondary | ICD-10-CM

## 2014-04-12 LAB — POCT URINALYSIS DIPSTICK
BILIRUBIN UA: NEGATIVE
GLUCOSE UA: NEGATIVE
NITRITE UA: NEGATIVE
PH UA: 5
Protein, UA: NEGATIVE
Urobilinogen, UA: NEGATIVE

## 2014-04-12 LAB — POCT URINE PREGNANCY: Preg Test, Ur: NEGATIVE

## 2014-04-12 MED ORDER — NORETHIN ACE-ETH ESTRAD-FE 1-20 MG-MCG PO TABS: ORAL_TABLET | ORAL | Status: DC

## 2014-04-12 MED ORDER — NITROFURANTOIN MONOHYD MACRO 100 MG PO CAPS: 100.0000 mg | ORAL_CAPSULE | Freq: Two times a day (BID) | ORAL | Status: DC

## 2014-04-12 NOTE — Patient Instructions (Signed)

## 2014-04-12 NOTE — Progress Notes (Signed)
34 y.o. G0P0 Married Caucasian Fe here for annual exam.  Had a new pack of pills that was started late in 02/14/23, then no menses ? In January.  Today is last day of active pills.  To have menses by the weekend.  Back together with husband since last May 15, 2022.  grandfather died in 14-Feb-2023 as she has felt stressed.   No LMP recorded (lmp unknown).          Sexually active: Yes.    The current method of family planning is OCP (estrogen/progesterone).    Exercising: No.  The patient does not participate in regular exercise at present. Smoker:  yes  Health Maintenance: Pap: 04/08/13 Neg. HR HPV:neg TDaP:  2014 Labs: PCP UA: RBC=Large, WBC=Trace, Ketones=Small, Ph=5.0 UPT: Negative   reports that she has been smoking Cigarettes.  She has a 3.5 pack-year smoking history. She has never used smokeless tobacco. She reports that she drinks about 1.2 oz of alcohol per week. She reports that she does not use illicit drugs.  Past Medical History  Diagnosis Date  . Asthma   . Depression     Past Surgical History  Procedure Laterality Date  . Hernia repair Right 1991  . Tonsillectomy  1997  . Ganglion cyst excision Left 4/15    3 rd toe    Current Outpatient Prescriptions  Medication Sig Dispense Refill  . albuterol (PROVENTIL HFA;VENTOLIN HFA) 108 (90 BASE) MCG/ACT inhaler Inhale 2 puffs into the lungs every 6 (six) hours as needed for wheezing.    [name] Kitchen ALPRAZolam (XANAX) 0.5 MG tablet Take 0.5 mg by mouth at bedtime as needed.     . cetirizine (ZYRTEC) 10 MG tablet Take 10 mg by mouth daily.    [name] Kitchen escitalopram (LEXAPRO) 20 MG tablet Take 20 mg by mouth daily.    . norethindrone-ethinyl estradiol (JUNEL FE 1/20) 1-20 MG-MCG tablet Take one tablet by mouth one time daily 3 Package 3  . Probiotic Product (PROBIOTIC DAILY) CAPS Take by mouth daily.    . nitrofurantoin, macrocrystal-monohydrate, (MACROBID) 100 MG capsule Take 1 capsule (100 mg total) by mouth 2 (two) times daily. 14 capsule 0   No  current facility-administered medications for this visit.    Family History  Problem Relation Age of Onset  . Adopted: Yes  . Uterine cancer Maternal Grandmother     ROS:  Pertinent items are noted in HPI.  Otherwise, a comprehensive ROS was negative.  Exam:   BP 102/60 mmHg  Pulse 80  Resp 16  Ht  (1.626 m)  Wt 151 lb (68.493 kg)  BMI 25.91 kg/m2  LMP  (LMP Unknown) Height:  (162.6 cm) Ht Readings from Last 3 Encounters:  04/12/14  (1.626 m)  01/17/14  (1.626 m)  11/17/13  (1.626 m)    General appearance: alert, cooperative and appears stated age Head: Normocephalic, without obvious abnormality, atraumatic Neck: no adenopathy, supple, symmetrical, trachea midline and thyroid normal to inspection and palpation Lungs: clear to auscultation bilaterally Breasts: normal appearance, no masses or tenderness Heart: regular rate and rhythm Abdomen: soft, non-tender; no masses,  no organomegaly Extremities: extremities normal, atraumatic, no cyanosis or edema Skin: Skin color, texture, turgor normal. No rashes or lesions Lymph nodes: Cervical, supraclavicular, and axillary nodes normal. No abnormal inguinal nodes palpated Neurologic: Grossly normal   Pelvic: External genitalia:  no lesions              Urethra:  normal appearing urethra with no  masses, tenderness or lesions              Bartholin's and Skene's: normal                 Vagina: normal appearing vagina with normal color and discharge, no lesions              Cervix: anteverted              Pap taken: No. Bimanual Exam:  Uterus:  normal size, contour, position, consistency, mobility, non-tender              Adnexa: no mass, fullness, tenderness               Rectovaginal: Confirms               Anus:  normal sphincter tone, no lesions  Chaperone present: Yes  A:  Well Woman with normal exam  OCP for Contraception Currently amenorrhea with recent stressors UPT is  negative R/O UTI  P:   Reviewed health and wellness pertinent to exam  Pap smear taken today  If no menses by first of next week to CB before starting a new pack of OCP  Refill of OCP if normal menses  RX for Macrobid 100 mg BID # 14  Call with culture results  Counseled on breast self exam, use and side effects of OCP's, adequate intake of calcium and vitamin D, diet and exercise return annually or prn  An After Visit Summary was printed and given to the patient.

## 2014-04-13 LAB — URINALYSIS, MICROSCOPIC ONLY
Bacteria, UA: NONE SEEN
Casts: NONE SEEN
Crystals: NONE SEEN
SQUAMOUS EPITHELIAL / LPF: NONE SEEN

## 2014-04-14 LAB — URINE CULTURE
COLONY COUNT: NO GROWTH
ORGANISM ID, BACTERIA: NO GROWTH

## 2014-04-15 NOTE — Progress Notes (Signed)
Reviewed personally.  M. Suzanne , MD.  

## 2014-06-10 ENCOUNTER — Other Ambulatory Visit: Payer: Self-pay | Admitting: Family Medicine

## 2014-06-10 DIAGNOSIS — R899 Unspecified abnormal finding in specimens from other organs, systems and tissues: Secondary | ICD-10-CM

## 2014-06-14 ENCOUNTER — Other Ambulatory Visit: Payer: Self-pay | Admitting: Nurse Practitioner

## 2014-06-14 NOTE — Telephone Encounter (Signed)
Medication refill request: Gildess  Last AEX:  04/12/14 PG Next AEX: 04/17/15 PG Last MMG (if hormonal medication request): none Refill authorized: Junel sent 04/12/14 [facility]3packs/[facility]R [facility] [place]

## 2014-06-16 ENCOUNTER — Ambulatory Visit
Admission: RE | Admit: 2014-06-16 | Discharge: 2014-06-16 | Disposition: A | Discharge status: Home / Self Care | Payer: 59 | Source: Ambulatory Visit | Attending: Family Medicine | Admitting: Family Medicine

## 2014-06-16 DIAGNOSIS — R899 Unspecified abnormal finding in specimens from other organs, systems and tissues: Secondary | ICD-10-CM

## 2014-07-11 ENCOUNTER — Telehealth: Payer: Self-pay | Admitting: Nurse Practitioner

## 2014-07-11 ENCOUNTER — Other Ambulatory Visit: Payer: Self-pay | Admitting: Nurse Practitioner

## 2014-07-11 MED ORDER — NORETHIN ACE-ETH ESTRAD-FE 1-20 MG-MCG PO TABS: ORAL_TABLET | ORAL | Status: DC

## 2014-07-11 NOTE — Telephone Encounter (Signed)
07/11/14 #3 packs/3 rfs was sent to [facility] [place]- Denied.

## 2014-07-11 NOTE — Telephone Encounter (Signed)
Spoke with patient. Patient was seen on 02.09.2016 with Megan FranklinPatricia Rolen-Grubb, FNP and was instructed by the pharmacy that she will need refills on her OCP. Per OV note from Megan FranklinPatricia Rolen-Grubb, FNP patient was to call back to let provider know if her cycle started. "I have been having spotting at the end of the month lately. I took a pregnancy test that was negative. I was recently diagnosed with hypothyroidism. I am on Synthroid 0.6425mcg daily and have a follow up appointment scheduled for next Tuesday." Rx for Junel fe 1/20 #3 3RF sent to pharmacy on file. Patient is agreeable and verbalizes understanding.  Routing to provider for final review. Patient agreeable to disposition. Patient aware provider will review message and nurse will return call with any additional instructions or change of disposition. Will close encounter.

## 2014-07-11 NOTE — Telephone Encounter (Signed)
Left message to call Kaitlyn at [phone]. 

## 2014-07-11 NOTE — Telephone Encounter (Signed)
Pt has questions regarding her rx refill - states pharmacy told her they have no more refills for her. Pt states she saw patty recently and she always gives a year.  [facility] on [address]

## 2015-04-13 ENCOUNTER — Other Ambulatory Visit: Payer: Self-pay | Admitting: Nurse Practitioner

## 2015-04-13 NOTE — Telephone Encounter (Signed)
Medication refill request: OCP Last AEX:  04-12-14 Next AEX: 04-16-15 Last MMG (if hormonal medication request): N/A Refill authorized: please advise

## 2015-04-17 ENCOUNTER — Ambulatory Visit: Payer: 59 | Admitting: Nurse Practitioner

## 2015-07-08 ENCOUNTER — Other Ambulatory Visit: Payer: Self-pay | Admitting: Certified Nurse Midwife

## 2015-07-10 NOTE — Telephone Encounter (Signed)
Medication refill request: Blisovi 1/20  Last AEX:  04/12/2014 with PG  Next AEX: No AEX scheduled for 2017 Last MMG (if hormonal medication request): n/a Refill authorized: #84  Left Message To Call Back to schedule AEX

## 2015-08-13 ENCOUNTER — Other Ambulatory Visit: Payer: Self-pay | Admitting: Nurse Practitioner

## 2015-08-14 NOTE — Telephone Encounter (Signed)
Medication refill request: Blisovi Fe 1/20 Last AEX:  04/12/14 PG Next AEX: Scheduled for 04/17/15. Pt canceled.  Last MMG (if hormonal medication request): Never Refill authorized: 07/10/15 Qty28 0R. Please advise.

## 2015-08-14 NOTE — Telephone Encounter (Signed)
Patient may have 1 refill only and is past due for AEX have her to schedule.

## 2015-08-14 NOTE — Telephone Encounter (Signed)
Pt advised that 1 refil will be called out for her OCP's. Pt must schedule AEX to receive anymore refils. -sco

## 2015-10-10 ENCOUNTER — Ambulatory Visit
Admission: RE | Admit: 2015-10-10 | Discharge: 2015-10-10 | Disposition: A | Discharge status: Home / Self Care | Payer: 59 | Source: Ambulatory Visit | Attending: Family Medicine | Admitting: Family Medicine

## 2015-10-10 ENCOUNTER — Other Ambulatory Visit: Payer: Self-pay | Admitting: Family Medicine

## 2015-10-10 DIAGNOSIS — S0083XA Contusion of other part of head, initial encounter: Secondary | ICD-10-CM

## 2015-10-11 ENCOUNTER — Emergency Department (HOSPITAL_COMMUNITY): Payer: 59

## 2015-10-11 ENCOUNTER — Encounter (HOSPITAL_COMMUNITY): Payer: Self-pay

## 2015-10-11 ENCOUNTER — Emergency Department (HOSPITAL_COMMUNITY)
Admission: EM | Admit: 2015-10-11 | Discharge: 2015-10-11 | Disposition: A | Discharge status: Home / Self Care | Payer: 59 | Attending: Emergency Medicine | Admitting: Emergency Medicine

## 2015-10-11 DIAGNOSIS — R51 Headache: Secondary | ICD-10-CM | POA: Insufficient documentation

## 2015-10-11 DIAGNOSIS — J45909 Unspecified asthma, uncomplicated: Secondary | ICD-10-CM | POA: Diagnosis not present

## 2015-10-11 DIAGNOSIS — Y999 Unspecified external cause status: Secondary | ICD-10-CM | POA: Insufficient documentation

## 2015-10-11 DIAGNOSIS — Y9389 Activity, other specified: Secondary | ICD-10-CM | POA: Insufficient documentation

## 2015-10-11 DIAGNOSIS — F17210 Nicotine dependence, cigarettes, uncomplicated: Secondary | ICD-10-CM | POA: Diagnosis not present

## 2015-10-11 DIAGNOSIS — M795 Residual foreign body in soft tissue: Secondary | ICD-10-CM | POA: Diagnosis not present

## 2015-10-11 DIAGNOSIS — Y939 Activity, unspecified: Secondary | ICD-10-CM | POA: Insufficient documentation

## 2015-10-11 DIAGNOSIS — Y929 Unspecified place or not applicable: Secondary | ICD-10-CM | POA: Diagnosis not present

## 2015-10-11 DIAGNOSIS — W34010A Accidental discharge of airgun, initial encounter: Secondary | ICD-10-CM | POA: Diagnosis not present

## 2015-10-11 DIAGNOSIS — S0993XA Unspecified injury of face, initial encounter: Secondary | ICD-10-CM | POA: Diagnosis not present

## 2015-10-11 MED ORDER — HYDROCODONE-ACETAMINOPHEN 5-325 MG PO TABS
1.0000 | ORAL_TABLET | Freq: Once | ORAL | Status: AC
  Administered 2015-10-11: 1 via ORAL
  Filled 2015-10-11: qty 1

## 2015-10-11 NOTE — ED Notes (Signed)
Patient able to ambulate independently  
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
## 2015-10-11 NOTE — ED Notes (Signed)
PA at bedside updating pt 

## 2015-10-11 NOTE — Discharge Instructions (Signed)
Please call the ENT physician listed first thing in the morning to schedule your follow up appointment for early next week. Return to ER if area has signs of infection such as redness or drainage. You can also return to ER for new or worsening symptoms, any additional concerns.
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
## 2015-10-16 DIAGNOSIS — M795 Residual foreign body in soft tissue: Secondary | ICD-10-CM | POA: Insufficient documentation

## 2015-11-30 ENCOUNTER — Telehealth: Payer: Self-pay | Admitting: Certified Nurse Midwife
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
## 2015-11-30 NOTE — Telephone Encounter (Signed)
Patient called and said, "I have been bleeding off and on for the last month. That's not normal for me."  Last seen: 04/12/14

## 2015-12-01 ENCOUNTER — Encounter: Payer: Self-pay | Admitting: Nurse Practitioner

## 2015-12-01 ENCOUNTER — Ambulatory Visit (INDEPENDENT_AMBULATORY_CARE_PROVIDER_SITE_OTHER): Payer: 59 | Admitting: Nurse Practitioner

## 2015-12-01 VITALS — BP 120/80 | HR 78 | Resp 16 | Ht 64.0 in | Wt 147.6 lb

## 2015-12-01 DIAGNOSIS — N939 Abnormal uterine and vaginal bleeding, unspecified: Secondary | ICD-10-CM | POA: Diagnosis not present

## 2015-12-01 LAB — CBC WITH DIFFERENTIAL/PLATELET
BASOS ABS: 0 {cells}/uL (ref 0–200)
BASOS PCT: 0 %
EOS ABS: 86 {cells}/uL (ref 15–500)
EOS PCT: 1 %
HCT: 43.1 % (ref 35.0–45.0)
HEMOGLOBIN: 14.5 g/dL (ref 11.7–15.5)
LYMPHS ABS: 2580 {cells}/uL (ref 850–3900)
Lymphocytes Relative: 30 %
MCH: 32.6 pg (ref 27.0–33.0)
MCHC: 33.6 g/dL (ref 32.0–36.0)
MCV: 96.9 fL (ref 80.0–100.0)
MONOS PCT: 8 %
MPV: 9.8 fL (ref 7.5–12.5)
Monocytes Absolute: 688 cells/uL (ref 200–950)
Neutro Abs: 5246 cells/uL (ref 1500–7800)
Neutrophils Relative %: 61 %
PLATELETS: 327 10*3/uL (ref 140–400)
RBC: 4.45 MIL/uL (ref 3.80–5.10)
RDW: 12.9 % (ref 11.0–15.0)
WBC: 8.6 10*3/uL (ref 3.8–10.8)

## 2015-12-01 LAB — HCG, SERUM, QUALITATIVE: Preg, Serum: NEGATIVE

## 2015-12-01 LAB — POCT URINE PREGNANCY: PREG TEST UR: NEGATIVE

## 2015-12-01 MED ORDER — MEDROXYPROGESTERONE ACETATE 10 MG PO TABS: 10.0000 mg | ORAL_TABLET | Freq: Every day | ORAL | 0 refills | Status: DC
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
## 2015-12-01 NOTE — Progress Notes (Signed)
Encounter reviewed by Dr.  Amundson C. Silva.  

## 2015-12-01 NOTE — Patient Instructions (Signed)

## 2015-12-05 LAB — HEMOGLOBIN, FINGERSTICK: HEMOGLOBIN, FINGERSTICK: 13.8 g/dL (ref 12.0–16.0)

## 2015-12-05 LAB — IPS N GONORRHOEA AND CHLAMYDIA BY PCR

## 2016-04-10 ENCOUNTER — Ambulatory Visit (INDEPENDENT_AMBULATORY_CARE_PROVIDER_SITE_OTHER): Payer: 59 | Admitting: Certified Nurse Midwife

## 2016-04-10 ENCOUNTER — Encounter: Payer: Self-pay | Admitting: Certified Nurse Midwife

## 2016-04-10 VITALS — BP 104/70 | HR 60 | Resp 16 | Ht 64.0 in | Wt 157.0 lb

## 2016-04-10 DIAGNOSIS — N912 Amenorrhea, unspecified: Secondary | ICD-10-CM | POA: Diagnosis not present

## 2016-04-10 DIAGNOSIS — Z3201 Encounter for pregnancy test, result positive: Secondary | ICD-10-CM | POA: Diagnosis not present

## 2016-04-10 LAB — POCT URINE PREGNANCY: PREG TEST UR: POSITIVE — AB

## 2016-04-10 NOTE — Progress Notes (Signed)
36 y.o.married white female g0p0 presents with amenorrhea with + UPT on 04/10/16. LNMP 03/16/16. Planned pregnancy. Best friend with her and agrees to conversation sharing with friend and any management she may need.  Complaining of breast tenderness, fatigue, nausea. Denies spotting, bleeding or cramping. Had pink faint stain x 1 when she wiped 4-5 days ago, none since. Medications she is taking ZOX:[id number]:Wellbutrin, Lexapro,Synthroid, Zyrtec, Xanax, Patient called Dr.Walters her PCP for medication management and will wean off wellbutrin and Lexapro over 7 day period in the next week. Stopped  Xanax. She will meet with her if problems with adjustment with weaning off. Patient excited but concerned. She also works with [employer] and concerned about exposure. No other concerns today.   . Patient has not consumed alcohol since + UPT. Spouse supportive.   ROS Pertinent to HPI  O: HPI pertinent to above. Healthy WDWN female Affect: normal, orientation x 3  Last Aex:04/12/14 normal Pap smear: 04/08/13            Rubella screen: not done  A: Amenorrhea with positive UPT  3 wk 3 days [facility]  [facility]   P: Reviewed with patient importance of prenatal care during pregnancy. Given OB provider list. Reviewed nutrition importance of pregnancy and selecting from all food groups and making sure to have adequate protein intake daily. Discussed avoiding raw or exotic fish, soft cheeses due to risk of bacteria . Discussed concerns with FAS with alcohol use in pregnancy. Discussed increase of IUGR and SIDS with smoking use or second smoke, patient working to stop now. Reviewed warning signs of early pregnancy and need to advise if occurs. Also given information about [employer] exposure per Up To Date with pregnancy. Patient will speak with employer about limiting exposure. She works with this.  Discussed comfort measures for early pregnancy changes. Offered viability PUS here prior to initiating prenatal  care. Patient plans to have PUS. She will be called with insurance information and scheduled. Questions addressed at length.    Rv prn  36 minutes in time spent with patient in face to face counseling regarding pregnancy and prenatal care

## 2016-04-10 NOTE — Patient Instructions (Signed)

## 2016-04-13 NOTE — Progress Notes (Signed)
Encounter reviewed. I'm not sure, why she is on the lexapro and wellbutrin. But depending on the level of depression/anxiety, the risk of not treating is typically felt to outweigh the risk of treatment. Lexapro, does not appear to have increased risks of congenital malformations, SAB's, or pregnancy complications. Patient's on Lexapro may have a slightly increased risk of post partum hemorrhage. The risks to Wellbutrin may be a little higher. I would recommend discussing her depression with her and considering weather or not she should stay on the Lexapro (maybe see how she does off of the Wellbutrin).  Gertie ExonJill , MD

## 2016-04-15 ENCOUNTER — Telehealth: Payer: Self-pay | Admitting: Certified Nurse Midwife

## 2016-04-15 DIAGNOSIS — N912 Amenorrhea, unspecified: Secondary | ICD-10-CM | POA: Diagnosis not present

## 2016-04-15 NOTE — Telephone Encounter (Signed)
Patient said she is returning a call to Suzy.  °

## 2016-04-16 ENCOUNTER — Other Ambulatory Visit: Payer: Self-pay | Admitting: *Deleted

## 2016-04-16 DIAGNOSIS — N912 Amenorrhea, unspecified: Secondary | ICD-10-CM

## 2016-04-16 DIAGNOSIS — Z3201 Encounter for pregnancy test, result positive: Secondary | ICD-10-CM

## 2016-04-16 NOTE — Telephone Encounter (Signed)
Spoke with patient regarding benefit for ultrasound. Patient understood and agreeable. Patient ready to schedule. Patient scheduled 05/02/16 with Dr Edward JollySilva. Patient  aware of date, arrival time and cancellation policy. No Further questions. Ok to close

## 2016-04-17 ENCOUNTER — Telehealth: Payer: Self-pay | Admitting: Obstetrics and Gynecology

## 2016-04-17 NOTE — Telephone Encounter (Signed)
Patient is pregnant and is having bad night sweats. She wants to know if this is normal.

## 2016-04-17 NOTE — Telephone Encounter (Signed)
Spoke with patient. Patient was seen on 04/10/2016 for positive UPT. LMP 03/16/2016. Patient states that she is having "excessive" sweating at night. "I always sweat at night, but this is bad. Even my legs are sweating and I am keeping my room cold." Patient is drinking a lot of water for rehydration. Reports that she has hypothyroidism and is taking Synthroid 50 mcg daily. Was seen with PCP and TSH level was not checked. States her level has not been checked in over a year. Has also been weaning off Lexapro and Wellbutrin. States last night was her last dose of both medications. Patient is worried about the amount of swearing she is having at night with pregnancy. Advised will review with Ria CommentPatricia Grubb, FNP as her primary provider and return call with further recommendations. Patient is agreeable.

## 2016-04-17 NOTE — Telephone Encounter (Signed)
Spoke with patient. Advised of message as seen below from Ria CommentPatricia Grubb, FNP. Patient verbalizes understanding. Will contact her PCP tp have TSH level checked.  Routing to provider for final review. Patient agreeable to disposition. Will close encounter.

## 2016-04-17 NOTE — Telephone Encounter (Signed)
I am concerned that she is getting this much sweating - the last TSH I see in chart comes from Dr. Paulino RilyWolters from 2016.  She does need a recheck on TSH.  She needs to go to PCP and have this rechecked.  If she has OB apt soon can also get that checked there.

## 2016-04-18 DIAGNOSIS — E039 Hypothyroidism, unspecified: Secondary | ICD-10-CM | POA: Diagnosis not present

## 2016-05-02 ENCOUNTER — Ambulatory Visit (INDEPENDENT_AMBULATORY_CARE_PROVIDER_SITE_OTHER): Payer: 59 | Admitting: Obstetrics and Gynecology

## 2016-05-02 ENCOUNTER — Encounter: Payer: Self-pay | Admitting: Obstetrics and Gynecology

## 2016-05-02 ENCOUNTER — Ambulatory Visit (INDEPENDENT_AMBULATORY_CARE_PROVIDER_SITE_OTHER): Payer: 59

## 2016-05-02 VITALS — BP 118/74 | HR 84 | Ht 64.0 in | Wt 160.0 lb

## 2016-05-02 DIAGNOSIS — Z3490 Encounter for supervision of normal pregnancy, unspecified, unspecified trimester: Secondary | ICD-10-CM | POA: Diagnosis not present

## 2016-05-02 DIAGNOSIS — Z3201 Encounter for pregnancy test, result positive: Secondary | ICD-10-CM | POA: Diagnosis not present

## 2016-05-02 DIAGNOSIS — N912 Amenorrhea, unspecified: Secondary | ICD-10-CM

## 2016-05-02 NOTE — Progress Notes (Signed)
Patient ID: Megan Lara, female   DOB: March 09, 1980, 36 y.o.   MRN: [id number] GYNECOLOGY  VISIT   HPI: 36 y.o.   Married  Caucasian  female   G1P0 with Patient's last menstrual period was 03/16/2016 (exact date).   here for pelvic ultrasound for viability.    Had some anxiety and chest pain last week.  Saw PCP. Was on Lexapro, Wellbutrin, and Xanax but is now on Zoloft.  Some nausea.  No vomiting.  No weight loss. Some urinary frequency, breast tenderness, and some fatigue.   On PNV.   GYNECOLOGIC HISTORY: Patient's last menstrual period was 03/16/2016 (exact date). Contraception:  none Menopausal hormone therapy:  n/a Last mammogram:  n/a Last pap smear:   04-08-13 Neg:Neg HR HPV        OB History    Gravida Para Term Preterm AB Living   1 0           SAB TAB Ectopic Multiple Live Births                     There are no active problems to display for this patient.   Past Medical History:  Diagnosis Date  . Asthma   . Depression     Past Surgical History:  Procedure Laterality Date  . GANGLION CYST EXCISION Left 4/15   3 rd toe  . HERNIA REPAIR Right 1991  . TONSILLECTOMY  1997    Current Outpatient Prescriptions  Medication Sig Dispense Refill  . albuterol (PROVENTIL HFA;VENTOLIN HFA) 108 (90 BASE) MCG/ACT inhaler Inhale 2 puffs into the lungs every 6 (six) hours as needed for wheezing.    . cetirizine (ZYRTEC) 10 MG tablet Take 10 mg by mouth daily.    [name] Kitchen. levothyroxine (SYNTHROID, LEVOTHROID) 50 MCG tablet 1 TABLET ONCE A DAY,TAKE IN AN EMPTY STOMACH/1ST THING IN AM ORALLY 30    . levothyroxine (SYNTHROID, LEVOTHROID) 75 MCG tablet Take 75 mcg by mouth daily before breakfast. Takes every other day--alternates with 50mcg tablets    . sertraline (ZOLOFT) 50 MG tablet Take 1 tablet by mouth daily.     No current facility-administered medications for this visit.      ALLERGIES: Patient has no known allergies.  Family History  Problem Relation Age of Onset   . Adopted: Yes  . Uterine cancer Maternal Grandmother     Social History   Social History  . Marital status: Married    Spouse name: N/A  . Number of children: 0  . Years of education: N/A   Occupational History  . Not on file.   Social History Main Topics  . Smoking status: Former Smoker    Packs/day: 0.50    Years: 7.00    Types: Cigarettes    Quit date: 03/04/2013  . Smokeless tobacco: Never Used     Comment: does some vape  . Alcohol use 0.0 oz/week     Comment: 9 a week  . Drug use: No  . Sexual activity: Yes    Partners: Male    Birth control/ protection: None   Other Topics Concern  . Not on file   Social History Narrative  . No narrative on file    ROS:  Pertinent items are noted in HPI.  PHYSICAL EXAMINATION:    BP 118/74 (BP Location: Left Arm, Patient Position: Sitting, Cuff Size: Normal)   Pulse 84   Ht 5\' 4"  (1.626 m)   Wt 160 lb (72.6 kg)   LMP  03/16/2016 (Exact Date)   BMI 27.46 kg/m     General appearance: alert, cooperative and appears stated age  Pelvic ultrasound:  Viable IUP.  Size equal to dates.  Cervix closed.  Small left CL cyst.   ASSESSMENT  Early viable intrauterine pregnancy. Anxiety and depression.  Currently on Zoloft. Hypothyroid.  On Synthroid.  PLAN  Discussion of early pregnancy care.  Avoidance of exposures discussed.  Xanax use not recommended in pregnancy.  Patient states she is already aware.  We reviewed Uptodate literature about Zoloft use in pregnancy.  Potential increased risk of postpartum hemorrhage discussed.  Her current medication list reviewed.  Reading material suggested.  List of obstetricians to patient.  Our congratulations and plan for follow up in our office after delivery!   An After Visit Summary was printed and given to the patient.  ___15___ minutes face to face time of which over 50% was spent in counseling.

## 2016-05-02 NOTE — Progress Notes (Signed)
Encounter reviewed by Dr. Brook Amundson C. Silva.  

## 2016-05-04 ENCOUNTER — Encounter: Payer: Self-pay | Admitting: Obstetrics and Gynecology

## 2016-05-27 LAB — OB RESULTS CONSOLE GC/CHLAMYDIA
CHLAMYDIA, DNA PROBE: NEGATIVE
GC PROBE AMP, GENITAL: NEGATIVE

## 2016-05-27 LAB — OB RESULTS CONSOLE HIV ANTIBODY (ROUTINE TESTING): HIV: NONREACTIVE

## 2016-05-27 LAB — OB RESULTS CONSOLE ABO/RH: RH TYPE: POSITIVE

## 2016-05-27 LAB — OB RESULTS CONSOLE HEPATITIS B SURFACE ANTIGEN: Hepatitis B Surface Ag: NEGATIVE

## 2016-05-27 LAB — OB RESULTS CONSOLE RPR: RPR: NONREACTIVE

## 2016-05-27 LAB — OB RESULTS CONSOLE ANTIBODY SCREEN: Antibody Screen: NEGATIVE

## 2016-05-27 LAB — OB RESULTS CONSOLE RUBELLA ANTIBODY, IGM: RUBELLA: IMMUNE

## 2016-10-08 DIAGNOSIS — R300 Dysuria: Secondary | ICD-10-CM | POA: Diagnosis not present

## 2016-10-14 ENCOUNTER — Inpatient Hospital Stay (HOSPITAL_COMMUNITY): Admission: AD | Admit: 2016-10-14 | Payer: 59 | Source: Ambulatory Visit | Admitting: Obstetrics & Gynecology

## 2016-10-22 DIAGNOSIS — M9902 Segmental and somatic dysfunction of thoracic region: Secondary | ICD-10-CM | POA: Diagnosis not present

## 2016-10-22 DIAGNOSIS — M9903 Segmental and somatic dysfunction of lumbar region: Secondary | ICD-10-CM | POA: Diagnosis not present

## 2016-10-22 DIAGNOSIS — M9901 Segmental and somatic dysfunction of cervical region: Secondary | ICD-10-CM | POA: Diagnosis not present

## 2016-10-25 DIAGNOSIS — M9901 Segmental and somatic dysfunction of cervical region: Secondary | ICD-10-CM | POA: Diagnosis not present

## 2016-10-25 DIAGNOSIS — M9902 Segmental and somatic dysfunction of thoracic region: Secondary | ICD-10-CM | POA: Diagnosis not present

## 2016-10-25 DIAGNOSIS — M9903 Segmental and somatic dysfunction of lumbar region: Secondary | ICD-10-CM | POA: Diagnosis not present

## 2016-10-31 DIAGNOSIS — M9901 Segmental and somatic dysfunction of cervical region: Secondary | ICD-10-CM | POA: Diagnosis not present

## 2016-10-31 DIAGNOSIS — M9902 Segmental and somatic dysfunction of thoracic region: Secondary | ICD-10-CM | POA: Diagnosis not present

## 2016-10-31 DIAGNOSIS — M9903 Segmental and somatic dysfunction of lumbar region: Secondary | ICD-10-CM | POA: Diagnosis not present

## 2016-11-05 DIAGNOSIS — M9901 Segmental and somatic dysfunction of cervical region: Secondary | ICD-10-CM | POA: Diagnosis not present

## 2016-11-05 DIAGNOSIS — M9902 Segmental and somatic dysfunction of thoracic region: Secondary | ICD-10-CM | POA: Diagnosis not present

## 2016-11-05 DIAGNOSIS — M9903 Segmental and somatic dysfunction of lumbar region: Secondary | ICD-10-CM | POA: Diagnosis not present

## 2016-11-07 DIAGNOSIS — M9902 Segmental and somatic dysfunction of thoracic region: Secondary | ICD-10-CM | POA: Diagnosis not present

## 2016-11-07 DIAGNOSIS — M9903 Segmental and somatic dysfunction of lumbar region: Secondary | ICD-10-CM | POA: Diagnosis not present

## 2016-11-07 DIAGNOSIS — M9901 Segmental and somatic dysfunction of cervical region: Secondary | ICD-10-CM | POA: Diagnosis not present

## 2016-11-13 LAB — OB RESULTS CONSOLE GBS: STREP GROUP B AG: NEGATIVE

## 2016-12-03 DIAGNOSIS — Z23 Encounter for immunization: Secondary | ICD-10-CM | POA: Diagnosis not present

## 2016-12-06 ENCOUNTER — Other Ambulatory Visit: Payer: Self-pay | Admitting: Obstetrics & Gynecology

## 2016-12-09 ENCOUNTER — Telehealth (HOSPITAL_COMMUNITY): Payer: Self-pay | Admitting: *Deleted

## 2016-12-09 ENCOUNTER — Encounter (HOSPITAL_COMMUNITY): Payer: Self-pay | Admitting: *Deleted

## 2016-12-10 NOTE — Telephone Encounter (Signed)
Preadmission screen  

## 2016-12-16 ENCOUNTER — Inpatient Hospital Stay (HOSPITAL_COMMUNITY): Payer: 59 | Admitting: Anesthesiology

## 2016-12-16 ENCOUNTER — Inpatient Hospital Stay (HOSPITAL_COMMUNITY)
Admission: RE | Admit: 2016-12-16 | Discharge: 2016-12-19 | DRG: 786 | Disposition: A | Discharge status: Home / Self Care | Payer: 59 | Source: Ambulatory Visit | Attending: Obstetrics & Gynecology | Admitting: Obstetrics & Gynecology

## 2016-12-16 ENCOUNTER — Inpatient Hospital Stay (HOSPITAL_COMMUNITY): Admission: RE | Admit: 2016-12-16 | Payer: 59 | Source: Ambulatory Visit

## 2016-12-16 ENCOUNTER — Encounter (HOSPITAL_COMMUNITY): Payer: Self-pay

## 2016-12-16 ENCOUNTER — Encounter (HOSPITAL_COMMUNITY)
Admission: RE | Disposition: A | Discharge status: Home / Self Care | Payer: Self-pay | Source: Ambulatory Visit | Attending: Obstetrics & Gynecology

## 2016-12-16 DIAGNOSIS — K219 Gastro-esophageal reflux disease without esophagitis: Secondary | ICD-10-CM | POA: Diagnosis present

## 2016-12-16 DIAGNOSIS — F419 Anxiety disorder, unspecified: Secondary | ICD-10-CM | POA: Diagnosis present

## 2016-12-16 DIAGNOSIS — F329 Major depressive disorder, single episode, unspecified: Secondary | ICD-10-CM | POA: Diagnosis present

## 2016-12-16 DIAGNOSIS — D62 Acute posthemorrhagic anemia: Secondary | ICD-10-CM | POA: Diagnosis not present

## 2016-12-16 DIAGNOSIS — O411230 Chorioamnionitis, third trimester, not applicable or unspecified: Secondary | ICD-10-CM | POA: Diagnosis present

## 2016-12-16 DIAGNOSIS — O99284 Endocrine, nutritional and metabolic diseases complicating childbirth: Secondary | ICD-10-CM | POA: Diagnosis present

## 2016-12-16 DIAGNOSIS — O480 Post-term pregnancy: Principal | ICD-10-CM | POA: Diagnosis present

## 2016-12-16 DIAGNOSIS — Z87891 Personal history of nicotine dependence: Secondary | ICD-10-CM

## 2016-12-16 DIAGNOSIS — O26893 Other specified pregnancy related conditions, third trimester: Secondary | ICD-10-CM | POA: Diagnosis not present

## 2016-12-16 DIAGNOSIS — O9081 Anemia of the puerperium: Secondary | ICD-10-CM | POA: Diagnosis not present

## 2016-12-16 DIAGNOSIS — O99344 Other mental disorders complicating childbirth: Secondary | ICD-10-CM | POA: Diagnosis present

## 2016-12-16 DIAGNOSIS — Z3490 Encounter for supervision of normal pregnancy, unspecified, unspecified trimester: Secondary | ICD-10-CM | POA: Diagnosis present

## 2016-12-16 DIAGNOSIS — O9962 Diseases of the digestive system complicating childbirth: Secondary | ICD-10-CM | POA: Diagnosis present

## 2016-12-16 DIAGNOSIS — O99019 Anemia complicating pregnancy, unspecified trimester: Secondary | ICD-10-CM | POA: Diagnosis present

## 2016-12-16 DIAGNOSIS — E039 Hypothyroidism, unspecified: Secondary | ICD-10-CM | POA: Diagnosis present

## 2016-12-16 DIAGNOSIS — Z3A40 40 weeks gestation of pregnancy: Secondary | ICD-10-CM

## 2016-12-16 DIAGNOSIS — O411290 Chorioamnionitis, unspecified trimester, not applicable or unspecified: Secondary | ICD-10-CM | POA: Diagnosis not present

## 2016-12-16 DIAGNOSIS — F4323 Adjustment disorder with mixed anxiety and depressed mood: Secondary | ICD-10-CM | POA: Diagnosis present

## 2016-12-16 HISTORY — DX: Chorioamnionitis, unspecified trimester, not applicable or unspecified: O41.1290

## 2016-12-16 HISTORY — DX: Adjustment disorder with mixed anxiety and depressed mood: F43.23

## 2016-12-16 HISTORY — DX: Hypothyroidism, unspecified: E03.9

## 2016-12-16 HISTORY — DX: Anemia complicating pregnancy, unspecified trimester: O99.019

## 2016-12-16 LAB — CBC
HEMATOCRIT: 32.6 % — AB (ref 36.0–46.0)
Hemoglobin: 10.8 g/dL — ABNORMAL LOW (ref 12.0–15.0)
MCH: 30.8 pg (ref 26.0–34.0)
MCHC: 33.1 g/dL (ref 30.0–36.0)
MCV: 92.9 fL (ref 78.0–100.0)
PLATELETS: 312 10*3/uL (ref 150–400)
RBC: 3.51 MIL/uL — ABNORMAL LOW (ref 3.87–5.11)
RDW: 13.6 % (ref 11.5–15.5)
WBC: 16.6 10*3/uL — AB (ref 4.0–10.5)

## 2016-12-16 LAB — TYPE AND SCREEN
ABO/RH(D): A POS
ANTIBODY SCREEN: NEGATIVE

## 2016-12-16 LAB — ABO/RH: ABO/RH(D): A POS

## 2016-12-16 LAB — RPR: RPR Ser Ql: NONREACTIVE

## 2016-12-16 SURGERY — Surgical Case
Anesthesia: Epidural

## 2016-12-16 MED ORDER — CHLOROPROCAINE HCL (PF) 3 % IJ SOLN
INTRAMUSCULAR | Status: AC
  Filled 2016-12-16: qty 20

## 2016-12-16 MED ORDER — DEXAMETHASONE SODIUM PHOSPHATE 10 MG/ML IJ SOLN
INTRAMUSCULAR | Status: AC
  Filled 2016-12-16: qty 1

## 2016-12-16 MED ORDER — LACTATED RINGERS IV SOLN: 500.0000 mL | INTRAVENOUS | Status: DC | PRN

## 2016-12-16 MED ORDER — MORPHINE SULFATE (PF) 0.5 MG/ML IJ SOLN
INTRAMUSCULAR | Status: DC | PRN
  Administered 2016-12-16: 1 mg via INTRAVENOUS
  Administered 2016-12-16: 4 mg via EPIDURAL

## 2016-12-16 MED ORDER — HYDROMORPHONE HCL 1 MG/ML IJ SOLN: 0.2500 mg | INTRAMUSCULAR | Status: DC | PRN

## 2016-12-16 MED ORDER — SCOPOLAMINE 1 MG/3DAYS TD PT72
MEDICATED_PATCH | TRANSDERMAL | Status: DC | PRN
  Administered 2016-12-16: 1 via TRANSDERMAL

## 2016-12-16 MED ORDER — FENTANYL 2.5 MCG/ML BUPIVACAINE 1/10 % EPIDURAL INFUSION (WH - ANES)
14.0000 mL/h | INTRAMUSCULAR | Status: DC | PRN
  Administered 2016-12-16 (×2): 14 mL/h via EPIDURAL
  Filled 2016-12-16: qty 100

## 2016-12-16 MED ORDER — MORPHINE SULFATE (PF) 0.5 MG/ML IJ SOLN
INTRAMUSCULAR | Status: AC
  Filled 2016-12-16: qty 10

## 2016-12-16 MED ORDER — ACETAMINOPHEN 500 MG PO TABS
1000.0000 mg | ORAL_TABLET | Freq: Four times a day (QID) | ORAL | Status: DC | PRN
  Administered 2016-12-16: 1000 mg via ORAL
  Filled 2016-12-16: qty 2

## 2016-12-16 MED ORDER — MEPERIDINE HCL 25 MG/ML IJ SOLN
25.0000 mg | Freq: Once | INTRAMUSCULAR | Status: AC
  Administered 2016-12-16: 25 mg via INTRAVENOUS

## 2016-12-16 MED ORDER — ONDANSETRON HCL 4 MG/2ML IJ SOLN
INTRAMUSCULAR | Status: DC | PRN
  Administered 2016-12-16: 4 mg via INTRAVENOUS

## 2016-12-16 MED ORDER — ONDANSETRON HCL 4 MG/2ML IJ SOLN
INTRAMUSCULAR | Status: AC
  Filled 2016-12-16: qty 2

## 2016-12-16 MED ORDER — DEXTROSE 5 % IV SOLN
2.0000 g | Freq: Four times a day (QID) | INTRAVENOUS | Status: DC
  Administered 2016-12-16 – 2016-12-17 (×2): 2 g via INTRAVENOUS
  Filled 2016-12-16 (×4): qty 2

## 2016-12-16 MED ORDER — DEXAMETHASONE SODIUM PHOSPHATE 10 MG/ML IJ SOLN
INTRAMUSCULAR | Status: DC | PRN
  Administered 2016-12-16: 10 mg via INTRAVENOUS

## 2016-12-16 MED ORDER — EPHEDRINE 5 MG/ML INJ: 10.0000 mg | INTRAVENOUS | Status: DC | PRN

## 2016-12-16 MED ORDER — ACETAMINOPHEN 325 MG PO TABS
650.0000 mg | ORAL_TABLET | ORAL | Status: DC | PRN
  Filled 2016-12-16: qty 2

## 2016-12-16 MED ORDER — SODIUM CHLORIDE 0.9 % IR SOLN
Status: DC | PRN
  Administered 2016-12-16: 1000 mL

## 2016-12-16 MED ORDER — OXYTOCIN 10 UNIT/ML IJ SOLN
INTRAMUSCULAR | Status: AC
Start: 2016-12-16 — End: 2016-12-16
  Filled 2016-12-16: qty 4

## 2016-12-16 MED ORDER — FENTANYL 2.5 MCG/ML BUPIVACAINE 1/10 % EPIDURAL INFUSION (WH - ANES)
INTRAMUSCULAR | Status: AC
  Filled 2016-12-16: qty 100

## 2016-12-16 MED ORDER — LIDOCAINE HCL (PF) 1 % IJ SOLN
INTRAMUSCULAR | Status: DC | PRN
  Administered 2016-12-16: 2 mL
  Administered 2016-12-16: 5 mL
  Administered 2016-12-16: 3 mL

## 2016-12-16 MED ORDER — PHENYLEPHRINE 40 MCG/ML (10ML) SYRINGE FOR IV PUSH (FOR BLOOD PRESSURE SUPPORT)
PREFILLED_SYRINGE | INTRAVENOUS | Status: AC
  Filled 2016-12-16: qty 10

## 2016-12-16 MED ORDER — MEPERIDINE HCL 25 MG/ML IJ SOLN
INTRAMUSCULAR | Status: AC
  Administered 2016-12-16: 25 mg via INTRAVENOUS
  Filled 2016-12-16: qty 1

## 2016-12-16 MED ORDER — LACTATED RINGERS IV SOLN: 500.0000 mL | Freq: Once | INTRAVENOUS | Status: DC

## 2016-12-16 MED ORDER — MISOPROSTOL 25 MCG QUARTER TABLET: 25.0000 ug | ORAL_TABLET | ORAL | Status: DC | PRN

## 2016-12-16 MED ORDER — PHENYLEPHRINE 40 MCG/ML (10ML) SYRINGE FOR IV PUSH (FOR BLOOD PRESSURE SUPPORT): 80.0000 ug | PREFILLED_SYRINGE | INTRAVENOUS | Status: DC | PRN

## 2016-12-16 MED ORDER — TERBUTALINE SULFATE 1 MG/ML IJ SOLN
0.2500 mg | Freq: Once | INTRAMUSCULAR | Status: DC | PRN
Start: 2016-12-16 — End: 2016-12-17

## 2016-12-16 MED ORDER — PROMETHAZINE HCL 25 MG/ML IJ SOLN: 6.2500 mg | INTRAMUSCULAR | Status: DC | PRN

## 2016-12-16 MED ORDER — BUTORPHANOL TARTRATE 1 MG/ML IJ SOLN
1.0000 mg | INTRAMUSCULAR | Status: DC | PRN
  Administered 2016-12-16: 1 mg via INTRAVENOUS
  Filled 2016-12-16: qty 1

## 2016-12-16 MED ORDER — PHENYLEPHRINE 40 MCG/ML (10ML) SYRINGE FOR IV PUSH (FOR BLOOD PRESSURE SUPPORT)
PREFILLED_SYRINGE | INTRAVENOUS | Status: AC
  Filled 2016-12-16: qty 20

## 2016-12-16 MED ORDER — SCOPOLAMINE 1 MG/3DAYS TD PT72
MEDICATED_PATCH | TRANSDERMAL | Status: AC
  Filled 2016-12-16: qty 1

## 2016-12-16 MED ORDER — CHLOROPROCAINE HCL (PF) 3 % IJ SOLN
INTRAMUSCULAR | Status: DC | PRN
  Administered 2016-12-16: 19 mL

## 2016-12-16 MED ORDER — FAMOTIDINE IN NACL 20-0.9 MG/50ML-% IV SOLN
20.0000 mg | Freq: Two times a day (BID) | INTRAVENOUS | Status: DC
  Administered 2016-12-16: 20 mg via INTRAVENOUS
  Filled 2016-12-16: qty 50

## 2016-12-16 MED ORDER — PHENYLEPHRINE HCL 10 MG/ML IJ SOLN
INTRAMUSCULAR | Status: DC | PRN
  Administered 2016-12-16 (×2): .08 mg via INTRAVENOUS

## 2016-12-16 MED ORDER — SOD CITRATE-CITRIC ACID 500-334 MG/5ML PO SOLN
30.0000 mL | ORAL | Status: DC | PRN
  Administered 2016-12-16 (×2): 30 mL via ORAL
  Filled 2016-12-16 (×2): qty 15

## 2016-12-16 MED ORDER — OXYTOCIN 40 UNITS IN LACTATED RINGERS INFUSION - SIMPLE MED
INTRAVENOUS | Status: AC
  Administered 2016-12-16
  Filled 2016-12-16: qty 1000

## 2016-12-16 MED ORDER — OXYTOCIN 10 UNIT/ML IJ SOLN
INTRAVENOUS | Status: DC | PRN
  Administered 2016-12-16: 40 [IU] via INTRAVENOUS

## 2016-12-16 MED ORDER — DIPHENHYDRAMINE HCL 50 MG/ML IJ SOLN: 12.5000 mg | INTRAMUSCULAR | Status: DC | PRN

## 2016-12-16 MED ORDER — OXYCODONE HCL 5 MG PO TABS: 5.0000 mg | ORAL_TABLET | Freq: Once | ORAL | Status: DC | PRN

## 2016-12-16 MED ORDER — LACTATED RINGERS IV SOLN
500.0000 mL | Freq: Once | INTRAVENOUS | Status: AC
  Administered 2016-12-16 (×2): via INTRAVENOUS

## 2016-12-16 MED ORDER — LACTATED RINGERS IV SOLN
INTRAVENOUS | Status: DC | PRN
  Administered 2016-12-16: 22:00:00 via INTRAVENOUS

## 2016-12-16 MED ORDER — CEFAZOLIN SODIUM-DEXTROSE 2-3 GM-%(50ML) IV SOLR
INTRAVENOUS | Status: DC | PRN
  Administered 2016-12-16: 2 g via INTRAVENOUS

## 2016-12-16 MED ORDER — ONDANSETRON HCL 4 MG/2ML IJ SOLN: 4.0000 mg | Freq: Four times a day (QID) | INTRAMUSCULAR | Status: DC | PRN

## 2016-12-16 MED ORDER — SODIUM BICARBONATE 8.4 % IV SOLN
INTRAVENOUS | Status: DC | PRN
  Administered 2016-12-16 (×3): 5 mL via EPIDURAL

## 2016-12-16 MED ORDER — LACTATED RINGERS IV SOLN
INTRAVENOUS | Status: DC
  Administered 2016-12-16 (×3): via INTRAVENOUS

## 2016-12-16 MED ORDER — FAMOTIDINE IN NACL 20-0.9 MG/50ML-% IV SOLN
INTRAVENOUS | Status: AC
  Filled 2016-12-16: qty 50

## 2016-12-16 MED ORDER — OXYCODONE HCL 5 MG/5ML PO SOLN: 5.0000 mg | Freq: Once | ORAL | Status: DC | PRN

## 2016-12-16 SURGICAL SUPPLY — 40 items
APL SKNCLS STERI-STRIP NONHPOA (GAUZE/BANDAGES/DRESSINGS) ×1
BENZOIN TINCTURE PRP APPL 2/3 (GAUZE/BANDAGES/DRESSINGS) ×2 IMPLANT
CHLORAPREP W/TINT 26ML (MISCELLANEOUS) ×3 IMPLANT
CLAMP CORD UMBIL (MISCELLANEOUS) ×2 IMPLANT
CLOSURE WOUND 1/2 X4 (GAUZE/BANDAGES/DRESSINGS) ×1
CLOTH BEACON ORANGE TIMEOUT ST (SAFETY) ×3 IMPLANT
CONTAINER PREFILL 10% NBF 15ML (MISCELLANEOUS) IMPLANT
DRSG OPSITE POSTOP 4X10 (GAUZE/BANDAGES/DRESSINGS) ×3 IMPLANT
ELECT REM PT RETURN 9FT ADLT (ELECTROSURGICAL) ×3
ELECTRODE REM PT RTRN 9FT ADLT (ELECTROSURGICAL) ×1 IMPLANT
EXTRACTOR VACUUM KIWI (MISCELLANEOUS) IMPLANT
EXTRACTOR VACUUM M CUP 4 TUBE (SUCTIONS) IMPLANT
EXTRACTOR VACUUM M CUP 4' TUBE (SUCTIONS)
GLOVE BIO SURGEON STRL SZ7 (GLOVE) ×3 IMPLANT
GLOVE BIOGEL PI IND STRL 7.0 (GLOVE) ×2 IMPLANT
GLOVE BIOGEL PI INDICATOR 7.0 (GLOVE) ×4
GOWN STRL REUS W/TWL LRG LVL3 (GOWN DISPOSABLE) ×6 IMPLANT
KIT ABG SYR 3ML LUER SLIP (SYRINGE) IMPLANT
NDL HYPO 25X5/8 SAFETYGLIDE (NEEDLE) IMPLANT
NEEDLE HYPO 25X5/8 SAFETYGLIDE (NEEDLE) IMPLANT
NS IRRIG 1000ML POUR BTL (IV SOLUTION) ×3 IMPLANT
PACK C SECTION WH (CUSTOM PROCEDURE TRAY) ×3 IMPLANT
PAD OB MATERNITY 4.3X12.25 (PERSONAL CARE ITEMS) ×3 IMPLANT
RTRCTR C-SECT PINK 25CM LRG (MISCELLANEOUS) ×2 IMPLANT
STRIP CLOSURE SKIN 1/2X4 (GAUZE/BANDAGES/DRESSINGS) ×1 IMPLANT
SUT MON AB-0 CT1 36 (SUTURE) ×6 IMPLANT
SUT PLAIN 0 NONE (SUTURE) IMPLANT
SUT PLAIN 2 0 (SUTURE)
SUT PLAIN ABS 2-0 CT1 27XMFL (SUTURE) IMPLANT
SUT PROLENE 1 CT (SUTURE) ×2 IMPLANT
SUT PROLENE 4 0 KS NDL (SUTURE) IMPLANT
SUT PROLENE 4 0 KS NEEDLE (SUTURE) ×3 IMPLANT
SUT VIC AB 0 CT1 27 (SUTURE) ×6
SUT VIC AB 0 CT1 27XBRD ANBCTR (SUTURE) ×2 IMPLANT
SUT VIC AB 2-0 CT1 27 (SUTURE) ×3
SUT VIC AB 2-0 CT1 TAPERPNT 27 (SUTURE) ×1 IMPLANT
SUT VIC AB 4-0 KS 27 (SUTURE) ×3 IMPLANT
SUT VICRYL 0 TIES 12 18 (SUTURE) IMPLANT
TOWEL OR 17X24 6PK STRL BLUE (TOWEL DISPOSABLE) ×3 IMPLANT
TRAY FOLEY BAG SILVER LF 14FR (SET/KITS/TRAYS/PACK) IMPLANT

## 2016-12-16 NOTE — Anesthesia Preprocedure Evaluation (Signed)
Anesthesia Evaluation  Patient identified by MRN, date of birth, ID band Patient awake    Reviewed: Allergy & Precautions, Patient's Chart, lab work & pertinent test results  Airway Mallampati: II  TM Distance: >3 FB     Dental   Pulmonary asthma , former smoker,    Pulmonary exam normal        Cardiovascular negative cardio ROS Normal cardiovascular exam     Neuro/Psych negative neurological ROS     GI/Hepatic negative GI ROS, Neg liver ROS,   Endo/Other  Hypothyroidism   Renal/GU negative Renal ROS     Musculoskeletal   Abdominal   Peds  Hematology  (+) anemia ,   Anesthesia Other Findings   Reproductive/Obstetrics (+) Pregnancy                             Lab Results  Component Value Date   WBC 16.6 (H) 12/16/2016   HGB 10.8 (L) 12/16/2016   HCT 32.6 (L) 12/16/2016   MCV 92.9 12/16/2016   PLT 312 12/16/2016   Lab Results  Component Value Date   CREATININE 0.64 09/12/2012   BUN 12 09/12/2012   NA 136 09/12/2012   K 3.8 09/12/2012   CL 101 09/12/2012   CO2 25 09/12/2012    Anesthesia Physical Anesthesia Plan  ASA: II  Anesthesia Plan: Epidural   Post-op Pain Management:    Induction:   PONV Risk Score and Plan: Treatment may vary due to age or medical condition  Airway Management Planned: Natural Airway  Additional Equipment:   Intra-op Plan:   Post-operative Plan:   Informed Consent: I have reviewed the patients History and Physical, chart, labs and discussed the procedure including the risks, benefits and alternatives for the proposed anesthesia with the patient or authorized representative who has indicated his/her understanding and acceptance.     Plan Discussed with:   Anesthesia Plan Comments:         Anesthesia Quick Evaluation

## 2016-12-16 NOTE — Progress Notes (Signed)
Since water is broken, plan is to discontinue cytotec admission orders and allow pt to labor on her own.  Order for epidural upon pt request received.

## 2016-12-16 NOTE — Transfer of Care (Signed)
Immediate Anesthesia Transfer of Care Note  Patient: Megan Lara  Procedure(s) Performed: CESAREAN SECTION (N/A )  Patient Location: PACU  Anesthesia Type:Epidural  Level of Consciousness: awake and alert   Airway & Oxygen Therapy: Patient Spontanous Breathing  Post-op Assessment: Report given to RN and Post -op Vital signs reviewed and stable  Post vital signs: Reviewed and stable  Last Vitals:  Vitals:   12/16/16 1928 12/16/16 2107  BP:    Pulse:    Resp:    Temp: (!) 38.2 C 36.9 C  SpO2: 100%     Last Pain:  Vitals:   12/16/16 2107  TempSrc: Axillary  PainSc:          Complications: No apparent anesthesia complications

## 2016-12-16 NOTE — H&P (Addendum)
Megan Lara is a 36 y.o. female presenting for IOL but then upon arrival to hospital had SROM with clear fluid.  +contractions since last night, no vaginal bleeding.  +FM.  Antepartum course complicated by anxiety - stable with zoloft, chronic anemia on iron, and hypothyroid, stable.  Antepartum testing has been wnl, last growth u/s 3 wks ago and efw 6lb 12 oz, nml afi. PNCare at [facility] since 10 wks. History         OB History    Gravida Para Term Preterm AB Living   1 0           SAB TAB Ectopic Multiple Live Births                     Past Medical History:  Diagnosis Date  . Anemia   . Asthma   . Depression   . Thyroid dysfunction in pregnancy in third trimester         Past Surgical History:  Procedure Laterality Date  . GANGLION CYST EXCISION Left 4/15   3 rd toe  . HERNIA REPAIR Right 1991  . TONSILLECTOMY  1997   Family History: family history includes Uterine cancer in her maternal grandmother. She was adopted. Social History:  reports that she quit smoking about 3 years ago. Her smoking use included Cigarettes. She has a 3.50 pack-year smoking history. She has never used smokeless tobacco. She reports that she drinks alcohol. She reports that she does not use drugs.  ROS  See HPI, otherwise neg   Dilation: 4 Effacement (%): 90 Station: -2, -3 Exam by:: Dr Amado Nash Blood pressure 124/74, pulse 90, temperature 98.6 F (37 C), temperature source Oral, resp. rate 16, height  (1.626 m), weight 188 lb (85.3 kg), last menstrual period 03/16/2016.   Temp:  [98.6 F (37 C)-98.9 F (37.2 C)] 98.6 F (37 C) (10/15 1010) Pulse Rate:  [60-112] 60 (10/15 1130) Resp:  [16-18] 16 (10/15 1010) BP: (104-130)/(34-90) 130/79 (10/15 1130) Weight:  [188 lb (85.3 kg)] 188 lb (85.3 kg) (10/15 0726)  Exam Physical Exam  Prenatal labs: ABO, Rh: --/--/A POS (10/15 0981) Antibody: NEG (10/15 0724) Rubella: Immune (03/26 0000) RPR: Nonreactive  (03/26 0000)  HBsAg: Negative (03/26 0000)  HIV: Non-reactive (03/26 0000)  GBS: Negative (09/12 0000)  1 hr Glucola - normal Genetic screening - neg Anatomy US - normal  Physical Exam:  A&O x 3 HEENT - grossly wnl Lungs : ctab CV : rrr Abdo : soft, nt, gravid; Extr : tr edema, nt bilat LE Pelvic : 4/90/-3; cephalic; iupc placed  FHT: 130s, periods of normal variability then decreased variability; +accels noted and no decels; + accels with scalp stim TOCO: difficult to trace, iupc placed, irreg q 2  Assessment/Plan: iup at 40.2 wga 1. srom - admit; plan svd; follow contractions and will begin pitocin for argumentation if needed; pt now with epidural; s/p stadol x1 2. gbs neg 3. Fetal status overall reassuring - contin efm and watch closely 4. Hypothyroid - stable, contin synthroid 5. Chronic anemia - monitor closely 6. Anxiety - stable, plan contin zoloft pp and follow closely in house and as outpt  Vick Frees 12/16/2016, 11:57 AM

## 2016-12-16 NOTE — Anesthesia Pain Management Evaluation Note (Signed)
  CRNA Pain Management Visit Note  Patient: Megan Lara, 36 y.o., female  "Hello I am a member of the anesthesia team at [facility]. We have an anesthesia team available at all times to provide care throughout the hospital, including epidural management and anesthesia for C-section. I don't know your plan for the delivery whether it a natural birth, water birth, IV sedation, nitrous supplementation, doula or epidural, but we want to meet your pain goals."   1.Was your pain managed to your expectations on prior hospitalizations?   No prior hospitalizations  2.What is your expectation for pain management during this hospitalization?     Epidural and IV pain meds  3.How can we help you reach that goal? IV pain meds, epidural possibly.  Record the patient's initial score and the patient's pain goal.   Pain: 4  Pain Goal: 6 [facility] wants you to be able to say your pain was always managed very well.  , L 12/16/2016

## 2016-12-16 NOTE — Progress Notes (Signed)
MVUs 90, however, pt is making cervical change and contractions are palpating strong.  Will continue to monitor.

## 2016-12-16 NOTE — Op Note (Signed)
Cesarean Section Procedure Note   Megan Lara 12/16/2016  Procedure: Primary Low Transverse Cesarean section  Indications:  Spontaneous labor at 40.2 wks, with chorioamnionitis and arrest of dilation at 8 cm  Pre-operative Diagnosis: primary cesarean section for arrest of dilation.   Post-operative Diagnosis: Same   Surgeon: Shea Evans, MD - Primary   Assistants: Carlean Jews, CNM  Anesthesia: epidural   Procedure Details:  The patient was seen in the Labor Room and arrest of dilation discussed and C-section delivery was recommended. The risks, benefits, complications, treatment options, and expected outcomes were discussed with the patient. The patient concurred with the proposed plan, giving informed consent. identified as Megan Lara and the procedure verified as C-Section Delivery. A Time Out was held and the above information confirmed. 2 gm Ancef given.  After epidural anesthesia was noted to be adequate, the patient was draped and prepped in the usual sterile manner. A pfannenstiel incision was made and carried down through the subcutaneous tissue to the fascia. Fascial incision was made and extended transversely. The fascia was separated from the underlying rectus tissue superiorly and inferiorly. The peritoneum was identified and entered. Peritoneal incision was extended longitudinally. Alexis retractor placed. The utero-vesical peritoneal reflection was incised transversely and the bladder flap was bluntly freed from the lower uterine segment. A low transverse uterine incision was made. Copious slightly turbid amniotic fluid drained.  Baby was in direct OP position and delivered cephalic by rotation and flexion at with vigorous cry at 22.26 hours. At one minute cord clamped and cut and baby BOY handed off to NICU team in attendance. Apgar scores of 8 at one minute and 9 at five minutes. Cord ph was not sent.  Cord blood was obtained for evaluation. The placenta was  removed Intact and appeared normal. The uterine outline, tubes and ovaries appeared normal. The uterine incision was closed with running locked sutures of followed by a second imbricating layer. Hemostasis was observed. 10 cc 1% Nesacaine instilled in the peritoneal cavity, Alexis retractor removed. Peritoneal closure done with 2-0 vicryl. The fascia was then reapproximated with running sutures of 0Vicryl. The subcuticular closure was performed using 2-0plain gut. The skin was closed with 4-0Vicryl. Steristrips applied and honeycomb dressing placed.   Instrument, sponge, and needle counts were correct prior the abdominal closure and were correct at the conclusion of the case.   Findings: BOY, direct OP, delivered cephalic. Apgars 8 and 9. Normal tubes and ovaries. Placenta, cord normal.    Estimated Blood Loss: 700 mL   Total IV Fluids: 1100 ml LR   Urine Output: 100CC OF bloody urine cleared up at the end of surgery  Specimens: cord blood  Complications: no complications  Disposition: PACU - hemodynamically stable.   Maternal Condition: stable   Baby condition / location:  Couplet care / Skin to Skin  Attending Attestation: I performed the procedure.   Signed: Surgeon(s): Shea Evans, MD

## 2016-12-16 NOTE — Progress Notes (Signed)
Megan Lara is a 36 y.o. female presenting for IOL at 40.2 wks but was noted to be SROM and in early labor at admission.  Maternal fever now. Axillary is 100.5 and oral 101.4. Pt now thinks possible SROM yesterday at 1 pm, few trickles but big gush this AM when arrived.  S/p epidural, comfortable.  anxiety - stable with zoloft,  chronic anemia on iron hypothyroid, stable.  Antepartum testing has been wnl, last growth u/s 3 wks ago and efw 6lb 12 oz, nml afi.  So EFW 8 lbs today  History OB History    Gravida Para Term Preterm AB Living   1 0           SAB TAB Ectopic Multiple Live Births                 Past Medical History:  Diagnosis Date  . Anemia   . Asthma   . Depression   . Thyroid dysfunction in pregnancy in third trimester    Past Surgical History:  Procedure Laterality Date  . GANGLION CYST EXCISION Left 4/15   3 rd toe  . HERNIA REPAIR Right 1991  . TONSILLECTOMY  1997   Family History: family history includes Uterine cancer in her maternal grandmother. She was adopted. Social History:  reports that she quit smoking about 3 years ago. Her smoking use included Cigarettes. She has a 3.50 pack-year smoking history. She has never used smokeless tobacco. She reports that she drinks alcohol. She reports that she does not use drugs.  ROS  Neg   Exam Physical Exam  BP 121/78   Pulse 99   Temp (!) 101.4 F (38.6 C) (Oral)   Resp 16   Ht  (1.626 m)   Wt 188 lb (85.3 kg)   LMP 03/16/2016 (Exact Date)   BMI 32.27 kg/m   A&O x 3 HEENT - grossly wnl Lungs : ctab CV : rrr Abdo : soft, nt, gravid; efw 7lbs Extr : tr edema, nt bilat LE Pelvic : 6-7/90/-3; cephalic; iupc replaced. Caput ++. High station.   FHT: 145 (slight increased baseline), periods of normal variability then decreased variability; +accels noted and no decels TOCO: difficult to trace, new iupc placed. Palpably strong UCs.   Assessment/Plan: iup at 40.2 wga 1. srom -?? >24 hrs vs this  AM. Protracted active labor, reassess in 1 hr. Risk of c/section d/w pt. 2. Fever- possible chorioamnionitis. Start Cefoxitin, add Tylenol prn 3. GERD -Pepcid 4. Fetal status overall reassuring - contin efm and watch closely 5. Hypothyroid - stable, contin synthroid 6. Chronic anemia - monitor closely 7. Anxiety - stable, plan contin zoloft pp and follow closely in house and as outpt  , R 12/16/2016, 4:44 PM

## 2016-12-16 NOTE — Anesthesia Procedure Notes (Signed)
Epidural Patient location during procedure: OB Start time: 12/16/2016 9:55 AM End time: 12/16/2016 10:00 AM  Staffing Anesthesiologist: Marcene Duos Performed: anesthesiologist   Preanesthetic Checklist Completed: patient identified, site marked, surgical consent, pre-op evaluation, timeout performed, IV checked, risks and benefits discussed and monitors and equipment checked  Epidural Patient position: sitting Prep: site prepped and draped and DuraPrep Patient monitoring: continuous pulse ox and blood pressure Approach: midline Location: L4-L5 Injection technique: LOR air  Needle:  Needle type: Tuohy  Needle gauge: 17 G Needle length: 9 cm and 9 Needle insertion depth: 7 cm Catheter type: closed end flexible Catheter size: 19 Gauge Catheter at skin depth: 12 cm Test dose: negative  Assessment Events: blood not aspirated, injection not painful, no injection resistance, negative IV test and no paresthesia

## 2016-12-16 NOTE — Progress Notes (Signed)
Megan Lara is a 36 y.o. female presenting for IOL but then upon arrival to hospital had SROM with clear fluid.  +contractions since last night, no vaginal bleeding.  +FM.  Antepartum course complicated by anxiety - stable with zoloft, chronic anemia on iron, and hypothyroid, stable.  Antepartum testing has been wnl, last growth u/s 3 wks ago and efw 6lb 12 oz, nml afi. PNCare at [facility] since 10 wks. History OB History    Gravida Para Term Preterm AB Living   1 0           SAB TAB Ectopic Multiple Live Births                 Past Medical History:  Diagnosis Date  . Anemia   . Asthma   . Depression   . Thyroid dysfunction in pregnancy in third trimester    Past Surgical History:  Procedure Laterality Date  . GANGLION CYST EXCISION Left 4/15   3 rd toe  . HERNIA REPAIR Right 1991  . TONSILLECTOMY  1997   Family History: family history includes Uterine cancer in her maternal grandmother. She was adopted. Social History:  reports that she quit smoking about 3 years ago. Her smoking use included Cigarettes. She has a 3.50 pack-year smoking history. She has never used smokeless tobacco. She reports that she drinks alcohol. She reports that she does not use drugs.  ROS  See HPI, otherwise neg   Dilation: 4 Effacement (%): 90 Station: -2, -3 Exam by:: Dr Amado Nash Blood pressure 124/74, pulse 90, temperature 98.6 F (37 C), temperature source Oral, resp. rate 16, height  (1.626 m), weight 188 lb (85.3 kg), last menstrual period 03/16/2016.   Temp:  [98.6 F (37 C)-98.9 F (37.2 C)] 98.6 F (37 C) (10/15 1010) Pulse Rate:  [60-112] 60 (10/15 1130) Resp:  [16-18] 16 (10/15 1010) BP: (104-130)/(34-90) 130/79 (10/15 1130) Weight:  [188 lb (85.3 kg)] 188 lb (85.3 kg) (10/15 0726)  Exam Physical Exam  Prenatal labs: ABO, Rh: --/--/A POS (10/15 1610) Antibody: NEG (10/15 0724) Rubella: Immune (03/26 0000) RPR: Nonreactive (03/26 0000)  HBsAg: Negative (03/26  0000)  HIV: Non-reactive (03/26 0000)  GBS: Negative (09/12 0000)  1 hr Glucola - normal Genetic screening - neg Anatomy US - normal  Physical Exam:  A&O x 3 HEENT - grossly wnl Lungs : ctab CV : rrr Abdo : soft, nt, gravid; efw 7lbs Extr : tr edema, nt bilat LE Pelvic : 4/90/-3; cephalic; iupc placed  FHT: 130s, periods of normal variability then decreased variability; +accels noted and no decels; + accels with scalp stim TOCO: difficult to trace, iupc placed, irreg q 2  Assessment/Plan: iup at 40.2 wga 1. srom - admit; plan svd; follow contractions and will begin pitocin for argumentation if needed; pt now with epidural; s/p stadol x1 2. gbs neg 3. Fetal status overall reassuring - contin efm and watch closely 4. Hypothyroid - stable, contin synthroid 5. Chronic anemia - monitor closely 6. Anxiety - stable, plan contin zoloft pp and follow closely in house and as outpt  Vick Frees 12/16/2016, 11:57 AM

## 2016-12-16 NOTE — Progress Notes (Signed)
Abraham Margulies is a 36 y.o. G1P0 at [redacted]w[redacted]d labor admit after SROM and progressed well.   Subjective: none  Objective: BP 113/64   Pulse (!) 114   Temp 98.4 F (36.9 C) (Axillary)   Resp 18   Ht  (1.626 m)   Wt 188 lb (85.3 kg)   LMP 03/16/2016 (Exact Date)   SpO2 100%   BMI 32.27 kg/m  I/O last 3 completed shifts: In: -  Out: 250 [Urine:250] No intake/output data recorded.  FHT:  FHR: 150 bpm, variability: minimal ,  accelerations:  Present,  decelerations:  Absent UC:   regular, every 2-3 minutes SVE: 8/100%/ -1/ Arrest of dilatation Hematuria noted.    Assessment / Plan: Arrest in active phase of labor, chorioamnionitis Well controlled hypothyroidism Anxiety/ depression.   FHT category I  Recc C/section. Risks/complications of surgery reviewed incl infection, bleeding, damage to internal organs including bladder, bowels, ureters, blood vessels, other risks from anesthesia, VTE and delayed complications of any surgery, complications in future surgery reviewed. Also discussed neonatal complications incl difficult delivery, laceration, vacuum assistance, TTN etc. Pt understands and agrees, all concerns addressed.    , R 12/16/2016, 9:38 PM

## 2016-12-17 ENCOUNTER — Encounter (HOSPITAL_COMMUNITY): Payer: Self-pay

## 2016-12-17 DIAGNOSIS — O411290 Chorioamnionitis, unspecified trimester, not applicable or unspecified: Secondary | ICD-10-CM

## 2016-12-17 DIAGNOSIS — F4323 Adjustment disorder with mixed anxiety and depressed mood: Secondary | ICD-10-CM

## 2016-12-17 DIAGNOSIS — E039 Hypothyroidism, unspecified: Secondary | ICD-10-CM

## 2016-12-17 DIAGNOSIS — O99019 Anemia complicating pregnancy, unspecified trimester: Secondary | ICD-10-CM

## 2016-12-17 HISTORY — DX: Hypothyroidism, unspecified: E03.9

## 2016-12-17 HISTORY — DX: Adjustment disorder with mixed anxiety and depressed mood: F43.23

## 2016-12-17 HISTORY — DX: Chorioamnionitis, unspecified trimester, not applicable or unspecified: O41.1290

## 2016-12-17 HISTORY — DX: Anemia complicating pregnancy, unspecified trimester: O99.019

## 2016-12-17 LAB — CBC
HEMATOCRIT: 29.2 % — AB (ref 36.0–46.0)
Hemoglobin: 9.8 g/dL — ABNORMAL LOW (ref 12.0–15.0)
MCH: 31.3 pg (ref 26.0–34.0)
MCHC: 33.6 g/dL (ref 30.0–36.0)
MCV: 93.3 fL (ref 78.0–100.0)
PLATELETS: 280 10*3/uL (ref 150–400)
RBC: 3.13 MIL/uL — AB (ref 3.87–5.11)
RDW: 13.8 % (ref 11.5–15.5)
WBC: 28.2 10*3/uL — AB (ref 4.0–10.5)

## 2016-12-17 MED ORDER — ALBUTEROL SULFATE (2.5 MG/3ML) 0.083% IN NEBU: 3.0000 mL | INHALATION_SOLUTION | Freq: Four times a day (QID) | RESPIRATORY_TRACT | Status: DC | PRN

## 2016-12-17 MED ORDER — LEVOTHYROXINE SODIUM 75 MCG PO TABS
75.0000 ug | ORAL_TABLET | Freq: Every day | ORAL | Status: DC
  Filled 2016-12-17: qty 1

## 2016-12-17 MED ORDER — DIPHENHYDRAMINE HCL 25 MG PO CAPS: 25.0000 mg | ORAL_CAPSULE | Freq: Four times a day (QID) | ORAL | Status: DC | PRN

## 2016-12-17 MED ORDER — LACTATED RINGERS IV BOLUS (SEPSIS)
300.0000 mL | Freq: Once | INTRAVENOUS | Status: AC
  Administered 2016-12-17: 300 mL via INTRAVENOUS

## 2016-12-17 MED ORDER — LEVOTHYROXINE SODIUM 50 MCG PO TABS
50.0000 ug | ORAL_TABLET | ORAL | Status: DC
  Administered 2016-12-18: 50 ug via ORAL
  Filled 2016-12-17 (×2): qty 1

## 2016-12-17 MED ORDER — SIMETHICONE 80 MG PO CHEW
80.0000 mg | CHEWABLE_TABLET | ORAL | Status: DC
  Administered 2016-12-18 (×2): 80 mg via ORAL
  Filled 2016-12-17 (×2): qty 1

## 2016-12-17 MED ORDER — SERTRALINE HCL 50 MG PO TABS
50.0000 mg | ORAL_TABLET | Freq: Every day | ORAL | Status: DC
  Filled 2016-12-17: qty 1

## 2016-12-17 MED ORDER — MENTHOL 3 MG MT LOZG: 1.0000 | LOZENGE | OROMUCOSAL | Status: DC | PRN

## 2016-12-17 MED ORDER — ZOLPIDEM TARTRATE 5 MG PO TABS: 5.0000 mg | ORAL_TABLET | Freq: Every evening | ORAL | Status: DC | PRN

## 2016-12-17 MED ORDER — SIMETHICONE 80 MG PO CHEW
80.0000 mg | CHEWABLE_TABLET | Freq: Three times a day (TID) | ORAL | Status: DC
  Administered 2016-12-17 – 2016-12-19 (×6): 80 mg via ORAL
  Filled 2016-12-17 (×6): qty 1

## 2016-12-17 MED ORDER — SIMETHICONE 80 MG PO CHEW: 80.0000 mg | CHEWABLE_TABLET | ORAL | Status: DC | PRN

## 2016-12-17 MED ORDER — COCONUT OIL OIL
1.0000 "application " | TOPICAL_OIL | Status: DC | PRN
  Administered 2016-12-17: 1 via TOPICAL
  Filled 2016-12-17: qty 120

## 2016-12-17 MED ORDER — OXYCODONE-ACETAMINOPHEN 5-325 MG PO TABS
2.0000 | ORAL_TABLET | ORAL | Status: DC | PRN
  Administered 2016-12-18 – 2016-12-19 (×2): 2 via ORAL
  Filled 2016-12-17 (×2): qty 2

## 2016-12-17 MED ORDER — TETANUS-DIPHTH-ACELL PERTUSSIS 5-2.5-18.5 LF-MCG/0.5 IM SUSP
0.5000 mL | Freq: Once | INTRAMUSCULAR | Status: DC
Start: 2016-12-17 — End: 2016-12-18

## 2016-12-17 MED ORDER — DEXTROSE 5 % IV SOLN
2.0000 g | Freq: Four times a day (QID) | INTRAVENOUS | Status: AC
  Administered 2016-12-17 – 2016-12-18 (×5): 2 g via INTRAVENOUS
  Filled 2016-12-17 (×5): qty 2

## 2016-12-17 MED ORDER — ACETAMINOPHEN 325 MG PO TABS
650.0000 mg | ORAL_TABLET | ORAL | Status: DC | PRN
  Administered 2016-12-17: 650 mg via ORAL
  Filled 2016-12-17: qty 2

## 2016-12-17 MED ORDER — PNEUMOCOCCAL VAC POLYVALENT 25 MCG/0.5ML IJ INJ
0.5000 mL | INJECTION | INTRAMUSCULAR | Status: DC
  Filled 2016-12-17: qty 0.5

## 2016-12-17 MED ORDER — WITCH HAZEL-GLYCERIN EX PADS: 1.0000 "application " | MEDICATED_PAD | CUTANEOUS | Status: DC | PRN

## 2016-12-17 MED ORDER — CETIRIZINE HCL 10 MG PO TABS: 10.0000 mg | ORAL_TABLET | Freq: Every day | ORAL | Status: DC

## 2016-12-17 MED ORDER — OXYCODONE-ACETAMINOPHEN 5-325 MG PO TABS
1.0000 | ORAL_TABLET | ORAL | Status: DC | PRN
  Administered 2016-12-17 – 2016-12-19 (×4): 1 via ORAL
  Filled 2016-12-17 (×4): qty 1

## 2016-12-17 MED ORDER — NALBUPHINE HCL 10 MG/ML IJ SOLN
5.0000 mg | INTRAMUSCULAR | Status: DC | PRN
  Administered 2016-12-17 (×2): 5 mg via INTRAVENOUS
  Filled 2016-12-17 (×2): qty 1

## 2016-12-17 MED ORDER — LEVOTHYROXINE SODIUM 50 MCG PO TABS
50.0000 ug | ORAL_TABLET | Freq: Every day | ORAL | Status: DC
  Filled 2016-12-17: qty 1

## 2016-12-17 MED ORDER — SERTRALINE HCL 50 MG PO TABS
50.0000 mg | ORAL_TABLET | Freq: Every day | ORAL | Status: DC
  Administered 2016-12-17 – 2016-12-18 (×2): 50 mg via ORAL
  Filled 2016-12-17 (×3): qty 1

## 2016-12-17 MED ORDER — DIBUCAINE 1 % RE OINT: 1.0000 "application " | TOPICAL_OINTMENT | RECTAL | Status: DC | PRN

## 2016-12-17 MED ORDER — OXYTOCIN 40 UNITS IN LACTATED RINGERS INFUSION - SIMPLE MED: 2.5000 [IU]/h | INTRAVENOUS | Status: AC

## 2016-12-17 MED ORDER — LEVOTHYROXINE SODIUM 75 MCG PO TABS
75.0000 ug | ORAL_TABLET | ORAL | Status: DC
  Administered 2016-12-17 – 2016-12-19 (×2): 75 ug via ORAL
  Filled 2016-12-17 (×2): qty 1

## 2016-12-17 MED ORDER — LACTATED RINGERS IV SOLN
INTRAVENOUS | Status: DC
  Administered 2016-12-17 (×2): via INTRAVENOUS

## 2016-12-17 MED ORDER — SENNOSIDES-DOCUSATE SODIUM 8.6-50 MG PO TABS
2.0000 | ORAL_TABLET | ORAL | Status: DC
  Administered 2016-12-18 (×2): 2 via ORAL
  Filled 2016-12-17 (×2): qty 2

## 2016-12-17 MED ORDER — ONDANSETRON HCL 4 MG/2ML IJ SOLN
4.0000 mg | Freq: Four times a day (QID) | INTRAMUSCULAR | Status: DC
  Administered 2016-12-17: 4 mg via INTRAVENOUS

## 2016-12-17 MED ORDER — CEFAZOLIN SODIUM-DEXTROSE 2-4 GM/100ML-% IV SOLN: 2.0000 g | INTRAVENOUS | Status: DC

## 2016-12-17 MED ORDER — LORATADINE 10 MG PO TABS: 10.0000 mg | ORAL_TABLET | Freq: Every day | ORAL | Status: DC

## 2016-12-17 MED ORDER — FAMOTIDINE 20 MG PO TABS: 20.0000 mg | ORAL_TABLET | Freq: Two times a day (BID) | ORAL | Status: DC | PRN

## 2016-12-17 MED ORDER — PRENATAL MULTIVITAMIN CH
1.0000 | ORAL_TABLET | Freq: Every day | ORAL | Status: DC
  Administered 2016-12-17 – 2016-12-19 (×3): 1 via ORAL
  Filled 2016-12-17 (×3): qty 1

## 2016-12-17 MED ORDER — IBUPROFEN 600 MG PO TABS
600.0000 mg | ORAL_TABLET | Freq: Four times a day (QID) | ORAL | Status: DC
  Administered 2016-12-17 – 2016-12-19 (×10): 600 mg via ORAL
  Filled 2016-12-17 (×10): qty 1

## 2016-12-17 MED ORDER — NON FORMULARY: 10.0000 mg | Freq: Every day | Status: DC

## 2016-12-17 NOTE — Addendum Note (Signed)
Addendum  created 12/17/16 1029 by Lowella Curb, MD   Anesthesia Event edited, Anesthesia Intra Meds edited

## 2016-12-17 NOTE — Progress Notes (Signed)
Patient ID: Megan Lara, female   DOB: 10-Jul-1980, 36 y.o.   MRN: [id number] Pt stable, VS reviewed. Labs reviewed. Fever resolved, continue Cefoxitin 48 hrs postpartum for chorioamnionitis with C-section . Repeat CBC tomorrow since WBC at 28. Continue Zoloft for anxiety/depression disorder, continue Synthroid.  Baby stable, tachypnea per nursery, delay circumcision.   V. MD

## 2016-12-17 NOTE — Anesthesia Postprocedure Evaluation (Signed)
Anesthesia Post Note  Patient: Megan Lara  Procedure(s) Performed: CESAREAN SECTION (N/A )     Patient location during evaluation: Mother Baby Anesthesia Type: Epidural Level of consciousness: awake and alert and oriented Pain management: pain level controlled Vital Signs Assessment: post-procedure vital signs reviewed and stable Respiratory status: spontaneous breathing and nonlabored ventilation Cardiovascular status: stable Postop Assessment: no headache, patient able to bend at knees, no backache, no apparent nausea or vomiting, epidural receding and adequate PO intake Anesthetic complications: no    Last Vitals:  Vitals:   12/17/16 0528 12/17/16 0531  BP: 125/70 97/61  Pulse: 98 96  Resp: 18 20  Temp:    SpO2:      Last Pain:  Vitals:   12/17/16 0546  TempSrc:   PainSc: 5    Pain Goal:                 Laban Emperor

## 2016-12-17 NOTE — Progress Notes (Signed)
MOB was referred for history of depression/anxiety. * Referral screened out by Clinical Social Worker because none of the following criteria appear to apply: ~ History of anxiety/depression during this pregnancy, or of post-partum depression.MOB currently taking Zoloft; no concerns noted in OB record. ~ Diagnosis of anxiety and/or depression within last 3 years OR * MOB's symptoms currently being treated with medication and/or therapy.  Please contact the Clinical Social Worker if needs arise, or if MOB requests.   Boyd-Gilyard, MSW, LCSW Clinical Social Work [phone] 

## 2016-12-17 NOTE — Lactation Note (Signed)
This note was copied from a baby's chart. Lactation Consultation Note  Patient Name: Megan Lara [id number] Date: 12/17/2016 Reason for consult: Initial assessment (LC encouraged mom/ call for feeding cues/ feeding assessment)  Baby is 13 hours, as LC entered the room the midwife finishing up her assessment. Mom getting ready to eat her lunch.  Baby sleeping in bedside crib.  Baby has been to the breast x4 - 10 -30 mins , and mom reports swallows when latched.  Voids and stools QS for age ( 5 voids , 3 stools ) and per dad the last stool was changing colors with some brown )  LC discussed with mom and dad feeding behaviors of a newborn, also often having nights and days switched around,  importance of STS feedings and in between. LC encouraged mom to call on the nurses light for a feeding assessment with feeding cues.  Mother informed of post-discharge support and given phone number to the lactation department, including services for phone call assistance; out-patient appointments; and breastfeeding support group. List of other breastfeeding resources in the community given in the handout. Encouraged mother to call for problems or concerns related to breastfeeding.  Maternal Data    Feeding Feeding Type:  (last fed at 0930 ) Length of feed: 15 min (per mom )  LATCH Score Latch: Repeated attempts needed to sustain latch, nipple held in mouth throughout feeding, stimulation needed to elicit sucking reflex.  Audible Swallowing: A few with stimulation  Type of Nipple: Everted at rest and after stimulation (short shaft with firm tissue)  Comfort (Breast/Nipple): Soft / non-tender  Hold (Positioning): Assistance needed to correctly position infant at breast and maintain latch.  LATCH Score: 7  Interventions Interventions: Breast feeding basics reviewed  Lactation Tools Discussed/Used     Consult Status Consult Status: Follow-up Date: 12/18/16 Follow-up type:  In-patient    [name]  12/17/2016, 11:51 AM

## 2016-12-17 NOTE — Progress Notes (Signed)
POSTOPERATIVE DAY # 1 S/P Primary LTCS for arrest of dilation and chorioamnionitis    S:         Reports feeling okay, "feels like I'm in a fog"             Tolerating po intake / + nausea and vomiting this morning, attempting to eat now / no flatus / no BM / reports itching from anesthesia  Denies dizziness, SOB, or CP             Bleeding is light             Pain controlled withMotrin and Tylenol             Foley in place, OOB with assistance   Newborn breast feeding - needs assistance with positioning and latching / Circumcision - yes, planning   O:  VS: BP 97/61   Pulse 96   Temp 99 F (37.2 C) (Oral)   Resp 20   Ht  (1.626 m)   Wt 85.3 kg (188 lb)   LMP 03/16/2016 (Exact Date)   SpO2 97%   Breastfeeding? Unknown   BMI 32.27 kg/m    LABS:               Recent Labs  12/16/16 0724 12/17/16 0500  WBC 16.6* 28.2*  HGB 10.8* 9.8*  PLT 312 280               Bloodtype: --/--/A POS, A POS (10/15 0724)  Rubella: Immune (03/26 0000)                                             I&O: Intake/Output      10/15 0701 - 10/16 0700 10/16 0701 - 10/17 0700   P.O. 480    I.V. (mL/kg) 1100 (12.9)    Total Intake(mL/kg) 1580 (18.5)    Urine (mL/kg/hr) 2075 (1)    Blood 700    Total Output 2775     Net -1195                       Physical Exam:             Alert and Oriented X3  Lungs: Clear and unlabored  Heart: regular rate and rhythm / no murmurs  Abdomen: soft, non-tender, non-distended, hypoactive bowel sounds, erythema noted where adhesive from drape was placed             Fundus: firm, non-tender, U-1             Dressing: honeycomb dsgw with steri-strips c/d/i              Incision:  approximated with sutures / no erythema / no ecchymosis / no drainage  Perineum: intact  Lochia: appropriate, no clots   Extremities: +1 BLE edema, no calf pain or tenderness, SCDs on  A/P:     POD # 1 S/P Primary LTCS for arrest of dilation and chorioamnionitis   ABL Anemia  compounding maternal IDA            Leukocytosis r/t Chorioamnionitis    - Continue Cefoxitin for 48 hours after c/s    - Repeat CBC in AM  Hx. Of anxiety   - CSW consult for anxiety   - On Zoloft  daily  Hx. Of Hypothyroidism    -  On Synthroid daily Q48hrs   - On Synthroid  daily Q48 hrs  Seasonal allergies   - Zyrtec  daily at bedtime - pt. Brought her own supply and declined Claritin alternative here   - order placed for pharmacy to dispense   Borderline oliguria / dark amber urine   - LR bolus x 1   - Push PO fluids            Routine postoperative care             See lactation today    Continue current care   Consult for plan of care: Dr. Corky Crafts, MSN, CNM [facility]

## 2016-12-17 NOTE — Addendum Note (Signed)
Addendum  created 12/17/16 0750 by Elgie Congo, CRNA   Charge Capture section accepted, Sign clinical note

## 2016-12-17 NOTE — Lactation Note (Addendum)
This note was copied from a baby's chart. Lactation Consultation Note  Patient Name: Megan Lara'X Date: 12/17/2016 Reason for consult: Follow-up assessment;Difficult latch;Other (Comment);Primapara;1st time breastfeeding;Infant weight loss;Nipple pain/trauma (excesssive edema )  Baby is 20 hours old , and mom has excessive areola edema causing a difficult latch.  LC noted a short lingual frenulum, and baby only extends tongue very short distance over the gum line.  Was able to spoon feed fairly well.  Noted both nipples to be pinky red with areolas bruised, mom ok with latching.  @ this consult attempted to latch 1st and abby was unable to stay latched - on and off .  LC showed mom how to hand express, pre-pump, and reverse pressure, latched with depth with #24 NS.  Baby at 1st was on and off and settled down and fed 10 mins, few swallows noted. While mom was feeding on the right, LC set up the DEBP and had mom pumped the other breast to enhance let down.  After baby finished on the right , spoon fed baby.5 ml and  Then latched on the left breast with the #24 NS and baby fed for  15 mins with multiple swallows and fell asleep.  After [facility] showed mom how to release suction, LC laid baby on moms chest and baby fell asleep.   LC reviewed the [facility] plan with mom and dad: also wrote it on her board Shells between feedings except when sleeping  Prior to latch - breast massage , hand express, pre- pump , and apply #24 NS , latch  Feed for 10 -20 mins or as long as baby desires . Offer 2nd breast , and post pump both breast for 15 - , save milk for next feeding.     Maternal Data Has patient been taught Hand Expression?: Yes Does the patient have breastfeeding experience prior to this delivery?: No  Feeding Feeding Type: Breast Fed (left breast / football ) Length of feed: 15 min (multiple swallows >8 )  LATCH Score Latch: Grasps breast easily, tongue down, lips flanged,  rhythmical sucking.  Audible Swallowing: Spontaneous and intermittent  Type of Nipple: Everted at rest and after stimulation (areola edema, NS for latch )  Comfort (Breast/Nipple): Soft / non-tender  Hold (Positioning): Assistance needed to correctly position infant at breast and maintain latch.  LATCH Score: 9  Interventions Interventions: Breast feeding basics reviewed;Assisted with latch;Skin to skin;Breast massage;Hand express;Pre-pump if needed;Reverse pressure;Breast compression;Adjust position;Support pillows;Position options;Shells;Hand pump;DEBP;Coconut oil  Lactation Tools Discussed/Used Tools: Shells;Pump;Flanges;Coconut oil;Nipple Shields Nipple shield size: 24;Other (comment) (LC sized and a good fit ) Flange Size: 24;Other (comment) (sized and fit well ) Shell Type: Inverted Breast pump type: Double-Electric Breast Pump Pump Review: Setup, frequency, and cleaning;Milk Storage Initiated by:: Megan Lara  Date initiated:: 12/17/16   Consult Status Consult Status: Follow-up Date: 12/18/16 Follow-up type: In-patient    Megan Lara  12/17/2016, 6:50 PM

## 2016-12-17 NOTE — Anesthesia Postprocedure Evaluation (Signed)
Anesthesia Post Note  Patient: [name]  Procedure(s) Performed: CESAREAN SECTION (N/A )     Patient location during evaluation: PACU Anesthesia Type: MAC Level of consciousness: awake and alert Pain management: pain level controlled Vital Signs Assessment: post-procedure vital signs reviewed and stable Respiratory status: spontaneous breathing, nonlabored ventilation and respiratory function stable Cardiovascular status: stable and blood pressure returned to baseline Postop Assessment: no apparent nausea or vomiting Anesthetic complications: no    Last Vitals:  Vitals:   12/16/16 2345 12/17/16 0003  BP: (!) 136/99 117/70  Pulse: (!) 111 (!) 110  Resp: 18 (!) 21  Temp:  37.8 C  SpO2: 99% 99%    Last Pain:  Vitals:   12/17/16 0003  TempSrc:   PainSc: 1    Pain Goal:                 Megan Lara

## 2016-12-18 LAB — CBC
HEMATOCRIT: 25.8 % — AB (ref 36.0–46.0)
HEMOGLOBIN: 8.7 g/dL — AB (ref 12.0–15.0)
MCH: 31.4 pg (ref 26.0–34.0)
MCHC: 33.7 g/dL (ref 30.0–36.0)
MCV: 93.1 fL (ref 78.0–100.0)
Platelets: 280 10*3/uL (ref 150–400)
RBC: 2.77 MIL/uL — AB (ref 3.87–5.11)
RDW: 13.8 % (ref 11.5–15.5)
WBC: 16.5 10*3/uL — ABNORMAL HIGH (ref 4.0–10.5)

## 2016-12-18 NOTE — Lactation Note (Addendum)
This note was copied from a baby's chart. Lactation Consultation Note  Patient Name: Megan Lara WUJWJ'XToday's Date: 12/18/2016 Reason for consult: Nipple pain/trauma  Mom's nipples are reddened, but Mom has no complaints (she was even comfortable with the aforementioned pumping). Mom's nipples were flushed w/saline to assist in removing blood residue & then the Comfort Gels were reapplied. Basic first aid for nipples discussed.  Mom feeling overwhelmed w/breastfeeding. Mom knows lactation is available on 3rd shift, also. I told Mom that we would do what we could to simplify things.   Dad inquired about pumping frequency; pumping parameters to promote a good milk supply discussed.   Mom is hypothyroid & is on Synthroid (L1) and Zoloft (L2).   [name],  [facility] 12/18/2016, 9:02 PM

## 2016-12-18 NOTE — Progress Notes (Signed)
POSTOPERATIVE DAY # 2 S/P CS with chorioamnionitis  S:         Reports feeling ok - really tired today             Tolerating po intake / no nausea / no vomiting / some flatus / no BM             Bleeding is light             Pain controlled with motrin and oxycodone             Up ad lib / ambulatory/ voiding QS  Newborn breas feeding  / Circumcision done  O:  VS: BP (!) 111/58   Pulse 86   Temp 99 F (37.2 C)   Resp 16   Ht 5\' 4"  (1.626 m)   Wt 85.3 kg (188 lb)   LMP 03/16/2016 (Exact Date)   SpO2 98%   Breastfeeding? Unknown   BMI 32.27 kg/m    LABS:               Recent Labs  12/17/16 0500 12/18/16 0522  WBC 28.2* 16.5*  HGB 9.8* 8.7*  PLT 280 280               Bloodtype: --/--/A POS, A POS (10/15 0724)  Rubella: Immune (03/26 0000)               tdap and flu vaccines given 2018                                                      I&O: net + 204               Physical Exam:             Alert and Oriented X3  Lungs: Clear and unlabored  Heart: regular rate and rhythm / no mumurs  Abdomen: soft, non-tender, mildly distended, hypoactive BS             Fundus: firm, non-tender, Ueven             Dressing intact              Incision:  approximated with suture / no erythema / no ecchymosis / no drainage  Perineum: intact  Lochia: light  Extremities: 2+ pedal and ankle edema, no calf pain or tenderness  A:        POD # 2 S/P CS            Chorioamnionitis - afebrile > 24hr / last dose abx late this PM            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

## 2016-12-18 NOTE — Lactation Note (Signed)
This note was copied from a baby's chart. Lactation Consultation Note  Patient Name: Boy Laurence Comptonlaina Casique OZDGU'YToday's Date: 12/18/2016    F/u visit attempted, but Mom has multiple visitors in room. Mom has my # to call when ready for consult.  Mom noted to have only bottle-fed today.   [name],  [facility] 12/18/2016, 5:03 PM

## 2016-12-18 NOTE — Lactation Note (Signed)
This note was copied from a baby's chart. Lactation Consultation Note  Patient Name: Megan Lara Phagan WUJWJ'XToday's Date: 12/18/2016 Reason for consult: Nipple pain/trauma  I had received call from RN that Mom's nipples were bleeding. When I entered room, Mom was pumping & the EBM was blood-tinged. Mom has not put baby to breast since yesterday. Mom was using size 24 flanges, which appeared tight. I gave her size 27 flanges instead & lubricated the flanges with coconut oil. Care was taken so that the nipple surfaces were not rubbing against the inside of the flange tunnel during pumping.   On the tip of Mom's L nipple, there is a very small area that was abraded. On Mom's R nipple, there was a very small area (about 7 o'clock) that is abraded. Comfort Gels provided w/instructions for use.   [name],  [facility] 12/18/2016, 7:44 PM

## 2016-12-19 MED ORDER — HYDROCHLOROTHIAZIDE 12.5 MG PO CAPS
12.5000 mg | ORAL_CAPSULE | Freq: Every day | ORAL | Status: DC
  Administered 2016-12-19: 12.5 mg via ORAL
  Filled 2016-12-19 (×2): qty 1

## 2016-12-19 MED ORDER — OXYCODONE-ACETAMINOPHEN 5-325 MG PO TABS: 1.0000 | ORAL_TABLET | ORAL | 0 refills | Status: DC | PRN

## 2016-12-19 MED ORDER — HYDROCHLOROTHIAZIDE 12.5 MG PO CAPS: 12.5000 mg | ORAL_CAPSULE | Freq: Every day | ORAL | 0 refills | Status: DC

## 2016-12-19 MED ORDER — IBUPROFEN 600 MG PO TABS
600.0000 mg | ORAL_TABLET | Freq: Four times a day (QID) | ORAL | 0 refills | Status: DC
Start: 2016-12-19 — End: 2018-05-21

## 2016-12-19 NOTE — Progress Notes (Signed)
POSTOPERATIVE DAY # 3 S/P CS with chorioamnioinitis  S:         Reports feeling sore and really swollen today             Tolerating po intake / no nausea / no vomiting / + flatus / no BM             Bleeding is light             Pain controlled with motrin and oxycodone             Up ad lib / ambulatory/ voiding QS  Newborn breast feeding / unsure newborn DC today - pending labs and chest x-ray  O:  VS: BP 119/68 (BP Location: Left Arm)   Pulse 96   Temp 98.2 F (36.8 C) (Oral)   Resp 18   Ht 5\' 4"  (1.626 m)   Wt 85.3 kg (188 lb)   LMP 03/16/2016 (Exact Date)   SpO2 98%   Breastfeeding? Unknown   BMI 32.27 kg/m                   Physical Exam:             Alert and Oriented X3  Lungs: Clear and unlabored  Heart: regular rate and rhythm / no mumurs  Abdomen: soft, non-tender, mildly distended             Fundus: firm, non-tender, U-1             Dressing intact              Incision:  approximated with suture / no erythema / no ecchymosis / no drainage  Perineum: intact  Lochia: light  Extremities: 3+ edema, no calf pain or tenderness, negative Homans  A:        POD # 3 S/P CS            Chorioamnionitis            Iron deficiency of pregnancy with compounded ABL anemia            Dependent edema  P:        Routine postoperative care              Start low dose HCTZ x 5 days - increase water to avoid dizziness              consider DC this pm   Marlinda MikeBAILEY, TANYA CNM, MSN, [facility] 12/19/2016, 8:50 AM

## 2016-12-19 NOTE — Lactation Note (Addendum)
This note was copied from a baby's chart. Lactation Consultation Note;  Mother pumping when I arrived in room. Mother advised to change flange to #27, #24 tight and rubbing sides of flange.  mother denies discomfort when pumping. Nipples are slightly pink. Mother pumped 33 ml.   Assist mother with application of #24 nipple shield. Reviewed proper application of shield.   When placing infant onto breast, observed that infant has a short anterior frenula that extends almost to tip of his tongue.  Infant has lateral movement and can elevate his tongue.    Assist mother with latching infant and positioning infant in football hold on the left breast. Infant latched on with wide open mouth. Mother taught breast compression. Infant sustained latch for 25 mins. Observed ebm in the nipple shield and on areola.  Infant still cuing when released the breast and self latched on the bare breast. Infant sustained latch for several mins . Observed mothers nipple as well rounded when infant released the breast.   Mother to change infants diaper and then offer the alternate breast. Mother plans to bottle feed ebm after changing  diaper. Discussed paced bottle feeding.   Advised mother to continue to cue base feed and feed infant at least 8-12 times in 24 hours.  Advised mother to continue to post pump and supplement infant with ebm as needed.  Mother receptive to plan of care. Mother denies having any concerns.    Patient Name: Megan Lara Reason for consult: Follow-up assessment   Maternal Data    Feeding Feeding Type: Breast Fed Length of feed: 25 min  LATCH Score Latch: Grasps breast easily, tongue down, lips flanged, rhythmical sucking.  Audible Swallowing: Spontaneous and intermittent  Type of Nipple: Everted at rest and after stimulation  Comfort (Breast/Nipple): Filling, red/small blisters or bruises, mild/mod discomfort  Hold (Positioning): Assistance  needed to correctly position infant at breast and maintain latch.  LATCH Score: 8  Interventions    Lactation Tools Discussed/Used Tools: Nipple Shields Nipple shield size: 24 Flange Size: 24 Breast pump type: Double-Electric Breast Pump   Consult Status Consult Status: Follow-up Date: 12/20/16 Follow-up type: Out-patient    Megan Lara,  Megan Lara, 1:33 PM

## 2016-12-19 NOTE — Discharge Summary (Signed)
OB Discharge Summary  Patient Name: Megan Lara DOB: 06-12-1980 MRN: [id number]  Date of admission: 12/16/2016  Admitting diagnosis:Labor onset Intrauterine pregnancy: 6395w2d      Date of discharge: 12/19/2016    Discharge diagnosis: Term Pregnancy Delivered, Anemia and POD 3 s/p CS with chorioamnionitis      Prenatal history: G1P1000   EDC : 12/14/2016, by Other Basis  [facility]  Primary provider : Mody Prenatal course complicated by postdates  Prenatal Labs: ABO, Rh: --/--/A POS, A POS (10/15 0724) Antibody: NEG (10/15 0724) Rubella: Immune (03/26 0000)  RPR: Non Reactive (10/15 0724)  HBsAg: Negative (03/26 0000)  HIV: Non-reactive (03/26 0000)  GBS: Negative (09/12 0000)                                    Hospital course:  Onset of Labor With Unplanned C/S  36 y.o. yo G1P1000 at 6995w2d was admitted in Latent Labor on 12/16/2016. Patient had a labor course significant for Arrest of dilation at 8cm with chorioamnionitis. Membrane Rupture Time/Date: 6:00 AM ,12/16/2016   The patient went for cesarean section due to Arrest of Dilation despite pitocin augmentation, and delivered a Viable infant,12/16/2016. Details of operation can be found in separate operative note.   Patient had an uncomplicated postpartum course with 48 hrs of antibiotic therapy for chorioamnionitis.  She is ambulating,tolerating a regular diet, passing flatus, and urinating well.  Patient is discharged home in stable condition 12/19/16.  Delivering PROVIDER: MODY, VAISHALI                                                            Complications: None  Newborn Data: Live born female  Birth Weight: 8 lb 13.8 oz (4020 g) APGAR: 8, 9  Newborn Delivery   Birth date/time:  12/16/2016 22:26:00 Delivery type:  C-Section, Low Transverse  C-section categorization:  Primary     Baby Feeding: Breast Disposition:home with mother  Post partum  procedures:none  Labs: Lab Results  Component Value Date   WBC 16.5 (H) 12/18/2016   HGB 8.7 (L) 12/18/2016   HCT 25.8 (L) 12/18/2016   MCV 93.1 12/18/2016   PLT 280 12/18/2016   CMP Latest Ref Rng & Units 09/12/2012  Glucose 70 - 99 mg/dL 91  BUN 6 - 23 mg/dL 12  Creatinine 4.090.50 - 8.111.10 mg/dL 9.140.64  Sodium 782135 - 956145 mEq/L 136  Potassium 3.5 - 5.1 mEq/L 3.8  Chloride 96 - 112 mEq/L 101  CO2 19 - 32 mEq/L 25  Calcium 8.4 - 10.5 mg/dL 9.6  Total Protein 6.0 - 8.3 g/dL 7.6  Total Bilirubin 0.3 - 1.2 mg/dL 0.4  Alkaline Phos 39 - 117 U/L 59  AST 0 - 37 U/L 17  ALT 0 - 35 U/L 15      Physical Exam @ time of discharge:  Vitals:   12/17/16 1600 12/18/16 0642 12/18/16 1803 12/19/16 0637  BP:  (!) 111/58 130/62 119/68  Pulse:  86 (!) 102 96  Resp: 16 16 17 18   Temp: 98.1 F (36.7 C) 99 F (37.2 C) 98.7 F (37.1 C) 98.2 F (36.8 C)  TempSrc: Oral  Oral  Oral  SpO2: 98%     Weight:      Height:        General: alert, cooperative and no distress Lochia: appropriate Uterine Fundus: firm Perineum: intact Incision: Healing well with no significant drainage Extremities: DVT Evaluation: No evidence of DVT seen on physical exam.   Discharge instructions:  "Baby and Me Booklet" and [facility] Booklet  Discharge Medications:  Allergies as of 12/19/2016   No Known Allergies     Medication List    TAKE these medications   acetaminophen 325 MG tablet Commonly known as:  TYLENOL Take 650 mg by mouth every 6 (six) hours as needed for mild pain or headache.   albuterol 108 (90 Base) MCG/ACT inhaler Commonly known as:  PROVENTIL HFA;VENTOLIN HFA Inhale 2 puffs into the lungs every 6 (six) hours as needed for wheezing.   cetirizine 10 MG tablet Commonly known as:  ZYRTEC Take 10 mg by mouth daily as needed for allergies.   famotidine 20 MG tablet Commonly known as:  PEPCID Take 20 mg by mouth 2 (two) times daily as needed for heartburn or indigestion.   FERRAPLUS 90  90-1 MG Tabs Take 1 tablet by mouth daily.   hydrochlorothiazide 12.5 MG capsule Commonly known as:  MICROZIDE Take 1 capsule (12.5 mg total) by mouth daily.   ibuprofen 600 MG tablet Commonly known as:  ADVIL,MOTRIN Take 1 tablet (600 mg total) by mouth every 6 (six) hours.   levothyroxine 75 MCG tablet Commonly known as:  SYNTHROID, LEVOTHROID Take 75 mcg by mouth daily before breakfast. Takes every other day--alternates with tablets   levothyroxine 50 MCG tablet Commonly known as:  SYNTHROID, LEVOTHROID 1 TABLET ONCE A DAY,TAKE IN AN EMPTY STOMACH/1ST THING IN AM ORALLY 30   oxyCODONE-acetaminophen 5-325 MG tablet Commonly known as:  PERCOCET/ROXICET Take 1 tablet by mouth every 4 (four) hours as needed (pain scale 4-7).   prenatal multivitamin Tabs tablet Take 1 tablet by mouth daily at 12 noon.   sertraline 50 MG tablet Commonly known as:  ZOLOFT Take 1 tablet by mouth daily.            Discharge Care Instructions        Start     Ordered   12/19/16 0000  Discharge wound care:    Comments:  Keep incision clean and dry   12/19/16 1324      Diet: routine diet  Activity: Advance as tolerated. Pelvic rest x 6 weeks.   Follow up:6 weeks    Signed: Marlinda Mike CNM, MSN, [facility] 12/19/2016, 1:28 PM

## 2016-12-20 ENCOUNTER — Ambulatory Visit: Payer: Self-pay

## 2016-12-20 NOTE — Lactation Note (Addendum)
This note was copied from a baby's chart. Lactation Consultation Note  Patient Name: Megan Lara ZDGUY'Q Date: 12/20/2016 Reason for consult: Follow-up assessment;Difficult latch;Nipple pain/trauma;1st time breastfeeding;Term   Follow up with first time mom of 70 hour old infant. Infant with 4 BF for 10-60 minutes with # 24 NS, EBM x 4 10-30 cc via bottle, formula x 1 via bottle 25 cc, 3 voids and 3 stools in last 24 hours. Infant weight 8 lb 6.4 oz with 5% weight loss since birth.   Mom reports infant does not always latch well and mom reports infant is often sleepy at breast. Mom is engorged this morning, she has used ice some and pumped before today. She is getting small amounts of colostrum that she is feeding infant. Engorgement treatment Handout given and explained to mom. Enc mom to ice breasts before feeding for 10-20 minutes, and feed infant at the breast for as long as he wants and follow with pumping for 15-20 minutes. Reviewed that resolution of engorgement is important to protect milk supply. Mom voiced understanding. Mom has a Medela PIS at home for use. Enc mom to take pump parts home with her.  Mom is engorged this morning, ice packs were given and applied. Mom iced for 10 minutes and then latched infant to the left breast. Mom used good pillow support. Mom applied # 24 NS independently.  Infant latched very easily with the # 24 NS and fed with frequent swallows. Infant did tire easily and did require awakening techniques to stay awake. Mom also massaged breast with feeding and it did help with keeping infant awake. Assisted mom with positioning and keeping latch deep. Infant was still feeding when LC left room. We discussed pre pumping the breasts as needed to latch infant and to soften one breast before offering the second.   Mom with positional stripe to left breast, she is using comfort gels to nipples post BF. Enc mom to ice breasts every 2-3 hours for 10-20 minutes, feed  infant at the breast keeping him awake as needed, pump for 15-20 minutes to soften breasts. Enc mom to supplement with EBM or Formula as needed. Enc mom to use Ibuprofen as needed to assist with inflammation.   Offered mom OP appt, she wants to call back and schedule. Reviewed Lactation Brochure, mom aware of OP services, BF Support Groups and LC phone #. Reviewed I/O, signs of dehydration in the infant and breast milk handling and storage. Mom reports she has no further questions/concerns at this time. Enc mom to call with questions/concerns.      Maternal Data Formula Feeding for Exclusion: No Has patient been taught Hand Expression?: Yes Does the patient have breastfeeding experience prior to this delivery?: No  Feeding Feeding Type: Breast Fed Length of feed: 15 min  LATCH Score Latch: Repeated attempts needed to sustain latch, nipple held in mouth throughout feeding, stimulation needed to elicit sucking reflex.  Audible Swallowing: Spontaneous and intermittent  Type of Nipple: Flat  Comfort (Breast/Nipple): Engorged, cracked, bleeding, large blisters, severe discomfort  Hold (Positioning): Assistance needed to correctly position infant at breast and maintain latch.  LATCH Score: 5  Interventions Interventions: Breast feeding basics reviewed;Support pillows;Assisted with latch;Position options;Skin to skin;Expressed milk;Coconut oil;Comfort gels;Pre-pump if needed;Reverse pressure;Breast compression;DEBP;Ice  Lactation Tools Discussed/Used Tools: Comfort gels;Pump Nipple shield size: 24 Flange Size: 24 Breast pump type: Double-Electric Breast Pump WIC Program: No Pump Review: Setup, frequency, and cleaning;Milk Storage Initiated by:: Reviewed and encouraged every 2-3  hours after ice application   Consult Status Consult Status: Complete Follow-up type: Call as needed    Ed BlalockSharon S  12/20/2016, 10:02 AM

## 2016-12-24 DIAGNOSIS — L7682 Other postprocedural complications of skin and subcutaneous tissue: Secondary | ICD-10-CM | POA: Diagnosis not present

## 2016-12-31 DIAGNOSIS — L7682 Other postprocedural complications of skin and subcutaneous tissue: Secondary | ICD-10-CM | POA: Diagnosis not present

## 2017-01-13 ENCOUNTER — Ambulatory Visit (INDEPENDENT_AMBULATORY_CARE_PROVIDER_SITE_OTHER): Payer: 59 | Admitting: Orthopaedic Surgery

## 2017-01-13 ENCOUNTER — Encounter (INDEPENDENT_AMBULATORY_CARE_PROVIDER_SITE_OTHER): Payer: Self-pay | Admitting: Orthopaedic Surgery

## 2017-01-13 DIAGNOSIS — G5602 Carpal tunnel syndrome, left upper limb: Secondary | ICD-10-CM | POA: Diagnosis not present

## 2017-01-13 DIAGNOSIS — G5601 Carpal tunnel syndrome, right upper limb: Secondary | ICD-10-CM | POA: Diagnosis not present

## 2017-01-13 NOTE — Progress Notes (Signed)
Office Visit Note   Patient: Megan Lara           Date of Birth: December 02, 1980           MRN: [id number] Visit Date: 01/13/2017              Requested by: Megan PalmerWolters, Sharon, MD [address] [place], [place] [address] PCP: Megan PalmerWolters, Sharon, MD   Assessment & Plan: Visit Diagnoses:  1. Carpal tunnel syndrome, left upper limb   2. Carpal tunnel syndrome, right upper limb     Plan: Given the severity of the numbness and tingling in both of her hands and based on my clinical exam I do feel that nerve conduction studies are necessary of the bilateral upper extremities to assess for carpal tunnel syndrome.  She has tried a splint at night and still uses it.  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  We will see her back once these studies have been obtained.  Questions and concerns were answered and addressed.  Follow-Up Instructions: Return in about 3 weeks (around 02/03/2017).   Orders:  No orders of the defined types were placed in this encounter.  No orders of the defined types were placed in this encounter.     Procedures: No procedures performed   Clinical Data: No additional findings.   Subjective: No chief complaint on file. The patient is a very pleasant 36 year old who is 4 weeks into having her first child.  She has had numbness and tingling in both of her hands that is gotten severe.  This was going on during pregnancy and actually on her right dominant side was going on before her pregnancy.  She points to the first 3 fingers as a source of her numbness and tingling.  It does wake her up at night and she is tried night splints as well.  She continues to use these.  Her numbness and tingling worsened during her pregnancy as well and it persists past her pregnancy.  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  She denies any neck  pain  HPI  Review of Systems She currently denies any headache, chest pain, shortness of breath, fever, chills, nausea, line.  Objective: Vital Signs: There were no vitals taken for this visit.  Physical Exam She is alert and oriented x3 and in no acute distress Ortho Exam Examination both hands shows no muscle atrophy.  She does have significant numbness in both hands especially with compression over the transverse carpal ligament.  I do not feel that there was any strength deficits in her hands but the numbness does seem consistent in the median nerve distribution bilaterally. Specialty Comments:  No specialty comments available.  Imaging: No results found.   PMFS History: Patient Active Problem List   Diagnosis Date Noted  . Carpal tunnel syndrome, left upper limb 01/13/2017  . Carpal tunnel syndrome, right upper limb 01/13/2017  . Cesarean delivery delivered: indication arrest of dilation; chorioamnionitis 12/17/2016  . Postpartum care following cesarean delivery (10/15) 12/17/2016  . Chorioamnionitis 12/17/2016  . Chorioamnionitis, delivered, current hospitalization 12/17/2016  . Anemia affecting pregnancy, IDA of pregnancy 12/17/2016  . Hypothyroidism 12/17/2016  . Adjustment disorder with mixed anxiety and depressed mood 12/17/2016   Past Medical History:  Diagnosis Date  . Adjustment disorder with mixed anxiety and depressed mood 12/17/2016  . Anemia   . Anemia affecting pregnancy, antepartum  12/17/2016  . Asthma   . Chorioamnionitis, delivered, current hospitalization 12/17/2016  . Depression   . Hypothyroidism 12/17/2016  . Thyroid dysfunction in pregnancy in third trimester     Family History  Adopted: Yes  Problem Relation Age of Onset  . Uterine cancer Maternal Grandmother     Past Surgical History:  Procedure Laterality Date  . GANGLION CYST EXCISION Left 4/15   3 rd toe  . HERNIA REPAIR Right 1991  . TONSILLECTOMY  1997   Social History    Occupational History  . Not on file  Tobacco Use  . Smoking status: Former Smoker    Packs/day: 0.50    Years: 7.00    Pack years: 3.50    Types: Cigarettes    Last attempt to quit: 03/04/2013    Years since quitting: 3.8  . Smokeless tobacco: Never Used  . Tobacco comment: does some vape  Substance and Sexual Activity  . Alcohol use: Yes    Alcohol/week: 0.0 oz    Comment: 9 a week  . Drug use: No  . Sexual activity: Yes    Partners: Male    Birth control/protection: None

## 2017-01-14 ENCOUNTER — Other Ambulatory Visit (INDEPENDENT_AMBULATORY_CARE_PROVIDER_SITE_OTHER): Payer: Self-pay

## 2017-01-14 DIAGNOSIS — M79641 Pain in right hand: Secondary | ICD-10-CM

## 2017-01-14 DIAGNOSIS — R200 Anesthesia of skin: Secondary | ICD-10-CM

## 2017-01-14 DIAGNOSIS — M79642 Pain in left hand: Principal | ICD-10-CM

## 2017-01-30 ENCOUNTER — Ambulatory Visit (INDEPENDENT_AMBULATORY_CARE_PROVIDER_SITE_OTHER): Payer: 59 | Admitting: Physical Medicine and Rehabilitation

## 2017-01-30 ENCOUNTER — Encounter (INDEPENDENT_AMBULATORY_CARE_PROVIDER_SITE_OTHER): Payer: Self-pay | Admitting: Physical Medicine and Rehabilitation

## 2017-01-30 DIAGNOSIS — R202 Paresthesia of skin: Secondary | ICD-10-CM | POA: Diagnosis not present

## 2017-01-30 NOTE — Progress Notes (Signed)
Megan Lara - 36 y.o. female MRN [id number]  Date of birth: June 17, 1980  Office Visit Note: Visit Date: 01/30/2017 PCP: Mila PalmerWolters, Sharon, MD Referred by: Mila PalmerWolters, Sharon, MD  Subjective: Chief Complaint  Patient presents with  . Right Hand - Numbness, Pain  . Left Hand - Numbness, Pain   HPI: Megan Lara is a 36 year old right-hand dominant female who comes in today at the request of Dr. Magnus IvanBlackman for electrodiagnostic studies of both hands.  She reports worsening numbness and tingling in the radial 3 digits of actually the left hand worse than the right at this point.  She is on maternity leave after having a cesarean section in October.  Since that time she had worsening numbness and tingling particularly in the left hand.  She was get worsening nocturnal complaints.  She reports the right hand actually started first and was present before the pregnancy.  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  She reports that since she has been wearing braces at night pretty consistently the symptoms have decreased some.  She has not had prior electrodiagnostic studies.  She denies any frank radicular symptoms.  She has had no specific trauma.    ROS Otherwise per HPI.  Assessment & Plan: Visit Diagnoses:  1. Paresthesia of skin     Plan: No additional findings.  mpression: The above electrodiagnostic study is ABNORMAL and reveals evidence of:  1.  A moderate right median nerve entrapment at the wrist (carpal tunnel syndrome) affecting sensory and motor components.   2.  A mild left median nerve entrapment at the wrist (carpal tunnel syndrome) affecting sensory components.  There is no significant electrodiagnostic evidence of any other focal nerve entrapment, brachial plexopathy or generalized peripheral neuropathy.   Recommendations: 1.  Follow-up with referring physician. 2.  Continue current management of symptoms. 3.  Continue  use of resting splint at night-time and as needed during the day. 4.  Suggest surgical evaluation.    Meds & Orders: No orders of the defined types were placed in this encounter.   Orders Placed This Encounter  Procedures  . NCV with EMG (electromyography)    Follow-up: Return for Dr. Magnus IvanBlackman as scheduled.   Procedures: No procedures performed  EMG & NCV Findings: Evaluation of the left median motor nerve showed prolonged distal onset latency (4.3 ms).  The right median motor nerve showed prolonged distal onset latency (5.6 ms), reduced amplitude (4.5 mV), and decreased conduction velocity (Elbow-Wrist, 45 m/s).  The left median (across palm) sensory and the right median (across palm) sensory nerves showed prolonged distal peak latency (Wrist, L4.5, R4.2 ms).  All remaining nerves (as indicated in the following tables) were within normal limits.  Left vs. Right side comparison data for the median motor nerve indicates abnormal L-R latency difference (1.3 ms).  All remaining left vs. right side differences were within normal limits.    All examined muscles (as indicated in the following table) showed no evidence of electrical instability.    Impression: The above electrodiagnostic study is ABNORMAL and reveals evidence of:  1.  A moderate right median nerve entrapment at the wrist (carpal tunnel syndrome) affecting sensory and motor components.   2.  A mild left median nerve entrapment at the wrist (carpal tunnel syndrome) affecting sensory components.  There is no significant electrodiagnostic evidence of any other focal nerve entrapment, brachial plexopathy or generalized peripheral neuropathy.   Recommendations: 1.  Follow-up with referring physician. 2.  Continue current management of symptoms. 3.  Continue use of resting splint at night-time and as needed during the day. 4.  Suggest surgical evaluation.      Nerve Conduction Studies Anti Sensory Summary Table   Stim  Site NR Peak (ms) Norm Peak (ms) P-T Amp (V) Norm P-T Amp Site1 Site2 Delta-P (ms) Dist (cm) Vel (m/s) Norm Vel (m/s)  Left Median Acr Palm Anti Sensory (2nd Digit)  33.1C  Wrist    *4.5 <3.6 18.2 >10 Wrist Palm 2.8 0.0    Palm    1.7 <2.0 27.9         Right Median Acr Palm Anti Sensory (2nd Digit)  32.9C  Wrist    *4.2 <3.6 19.1 >10 Wrist Palm 2.2 0.0    Palm    2.0 <2.0 22.7         Left Radial Anti Sensory (Base 1st Digit)  33.2C  Wrist    1.8 <3.1 46.4  Wrist Base 1st Digit 1.8 0.0    Right Radial Anti Sensory (Base 1st Digit)  33.2C  Wrist    1.9 <3.1 38.3  Wrist Base 1st Digit 1.9 0.0    Left Ulnar Anti Sensory (5th Digit)  33.4C  Wrist    2.9 <3.7 31.3 >15.0 Wrist 5th Digit 2.9 14.0 48 >38  Right Ulnar Anti Sensory (5th Digit)  33.2C  Wrist    3.0 <3.7 21.0 >15.0 Wrist 5th Digit 3.0 14.0 47 >38   Motor Summary Table   Stim Site NR Onset (ms) Norm Onset (ms) O-P Amp (mV) Norm O-P Amp Site1 Site2 Delta-0 (ms) Dist (cm) Vel (m/s) Norm Vel (m/s)  Left Median Motor (Abd Poll Brev)  33.2C  Wrist    *4.3 <4.2 7.2 >5 Elbow Wrist 3.9 20.5 53 >50  Elbow    8.2  6.9         Right Median Motor (Abd Poll Brev)  33.2C  Wrist    *5.6 <4.2 *4.5 >5 Elbow Wrist 4.6 20.5 *45 >50  Elbow    10.2  4.1         Left Ulnar Motor (Abd Dig Min)  33.1C  Wrist    2.5 <4.2 6.7 >3 B Elbow Wrist 2.8 19.5 70 >53  B Elbow    5.3  7.1  A Elbow B Elbow 1.2 9.5 79 >53  A Elbow    6.5  7.2         Right Ulnar Motor (Abd Dig Min)  33.3C  Wrist    2.7 <4.2 7.5 >3 B Elbow Wrist 2.8 19.5 70 >53  B Elbow    5.5  7.8  A Elbow B Elbow 1.1 9.0 82 >53  A Elbow    6.6  7.9          EMG   Side Muscle Nerve Root Ins Act Fibs Psw Amp Dur Poly Recrt Int Dennie Bible Comment  Right Abd Poll Brev Median C8-T1 Nml Nml Nml Nml Nml 0 Nml Nml   Right 1stDorInt Ulnar C8-T1 Nml Nml Nml Nml Nml 0 Nml Nml   Right PronatorTeres Median C6-7 Nml Nml Nml Nml Nml 0 Nml Nml   Right Biceps Musculocut C5-6 Nml Nml Nml Nml Nml 0 Nml Nml    Right Deltoid Axillary C5-6 Nml Nml Nml Nml Nml 0 Nml Nml     Nerve Conduction Studies Anti Sensory Left/Right Comparison   Stim Site L Lat (ms) R Lat (ms) L-R Lat (  ms) L Amp (V) R Amp (V) L-R Amp (%) Site1 Site2 L Vel (m/s) R Vel (m/s) L-R Vel (m/s)  Median Acr Palm Anti Sensory (2nd Digit)  33.1C  Wrist *4.5 *4.2 0.3 18.2 19.1 4.7 Wrist Palm     Palm 1.7 2.0 0.3 27.9 22.7 18.6       Radial Anti Sensory (Base 1st Digit)  33.2C  Wrist 1.8 1.9 0.1 46.4 38.3 17.5 Wrist Base 1st Digit     Ulnar Anti Sensory (5th Digit)  33.4C  Wrist 2.9 3.0 0.1 31.3 21.0 32.9 Wrist 5th Digit 48 47 1   Motor Left/Right Comparison   Stim Site L Lat (ms) R Lat (ms) L-R Lat (ms) L Amp (mV) R Amp (mV) L-R Amp (%) Site1 Site2 L Vel (m/s) R Vel (m/s) L-R Vel (m/s)  Median Motor (Abd Poll Brev)  33.2C  Wrist *4.3 *5.6 *1.3 7.2 *4.5 37.5 Elbow Wrist 53 *45 8  Elbow 8.2 10.2 2.0 6.9 4.1 40.6       Ulnar Motor (Abd Dig Min)  33.1C  Wrist 2.5 2.7 0.2 6.7 7.5 10.7 B Elbow Wrist 70 70 0  B Elbow 5.3 5.5 0.2 7.1 7.8 9.0 A Elbow B Elbow 79 82 3  A Elbow 6.5 6.6 0.1 7.2 7.9 8.9          Waveforms:                     Clinical History: No specialty comments available.  She reports that she quit smoking about 3 years ago. Her smoking use included cigarettes. She has a 3.50 pack-year smoking history. she has never used smokeless tobacco. No results for input(s): HGBA1C, LABURIC in the last 8760 hours.  Objective:  VS:  HT:    WT:   BMI:     BP:   HR: bpm  TEMP: ( )  RESP:  Physical Exam  Musculoskeletal:  Inspection reveals no atrophy of the bilateral APB or FDI or hand intrinsics. There is no swelling, color changes, allodynia or dystrophic changes. There is 5 out of 5 strength in the bilateral wrist extension, finger abduction and long finger flexion.  There is some impaired sensation in the median nerve distribution on the right compared to left.  She has a positive Tinel's on the left wrist  compared to right.Gaylord Shih.    Ortho Exam Imaging: No results found.  Past Medical/Family/Surgical/Social History: Medications & Allergies reviewed per EMR Patient Active Problem List   Diagnosis Date Noted  . Carpal tunnel syndrome, left upper limb 01/13/2017  . Carpal tunnel syndrome, right upper limb 01/13/2017  . Cesarean delivery delivered: indication arrest of dilation; chorioamnionitis 12/17/2016  . Postpartum care following cesarean delivery (10/15) 12/17/2016  . Chorioamnionitis 12/17/2016  . Chorioamnionitis, delivered, current hospitalization 12/17/2016  . Anemia affecting pregnancy, IDA of pregnancy 12/17/2016  . Hypothyroidism 12/17/2016  . Adjustment disorder with mixed anxiety and depressed mood 12/17/2016   Past Medical History:  Diagnosis Date  . Adjustment disorder with mixed anxiety and depressed mood 12/17/2016  . Anemia   . Anemia affecting pregnancy, antepartum 12/17/2016  . Asthma   . Chorioamnionitis, delivered, current hospitalization 12/17/2016  . Depression   . Hypothyroidism 12/17/2016  . Thyroid dysfunction in pregnancy in third trimester    Family History  Adopted: Yes  Problem Relation Age of Onset  . Uterine cancer Maternal Grandmother    Past Surgical History:  Procedure Laterality Date  . CESAREAN SECTION N/A 12/16/2016  Procedure: CESAREAN SECTION;  Surgeon: Shea Evans, MD;  Location: [facility] BIRTHING SUITES;  Service: Obstetrics;  Laterality: N/A;  . GANGLION CYST EXCISION Left 4/15   3 rd toe  . HERNIA REPAIR Right 1991  . TONSILLECTOMY  1997   Social History   Occupational History  . Not on file  Tobacco Use  . Smoking status: Former Smoker    Packs/day: 0.50    Years: 7.00    Pack years: 3.50    Types: Cigarettes    Last attempt to quit: 03/04/2013    Years since quitting: 3.9  . Smokeless tobacco: Never Used  . Tobacco comment: does some vape  Substance and Sexual Activity  . Alcohol use: Yes    Alcohol/week: 0.0 oz     Comment: 9 a week  . Drug use: No  . Sexual activity: Yes    Partners: Male    Birth control/protection: None

## 2017-01-30 NOTE — Progress Notes (Deleted)
Numbness, tingling, and pain in fingers. Left hand is worse; second and third fingers are worse. Feels like symptoms have decreased since wearing splints at night. Symptoms were getting worse at night. Right hand dominant.

## 2017-01-31 NOTE — Procedures (Signed)
EMG & NCV Findings: Evaluation of the left median motor nerve showed prolonged distal onset latency (4.3 ms).  The right median motor nerve showed prolonged distal onset latency (5.6 ms), reduced amplitude (4.5 mV), and decreased conduction velocity (Elbow-Wrist, 45 m/s).  The left median (across palm) sensory and the right median (across palm) sensory nerves showed prolonged distal peak latency (Wrist, L4.5, R4.2 ms).  All remaining nerves (as indicated in the following tables) were within normal limits.  Left vs. Right side comparison data for the median motor nerve indicates abnormal L-R latency difference (1.3 ms).  All remaining left vs. right side differences were within normal limits.    All examined muscles (as indicated in the following table) showed no evidence of electrical instability.    Impression: The above electrodiagnostic study is ABNORMAL and reveals evidence of:  1.  A moderate right median nerve entrapment at the wrist (carpal tunnel syndrome) affecting sensory and motor components.   2.  A mild left median nerve entrapment at the wrist (carpal tunnel syndrome) affecting sensory components.  There is no significant electrodiagnostic evidence of any other focal nerve entrapment, brachial plexopathy or generalized peripheral neuropathy.   Recommendations: 1.  Follow-up with referring physician. 2.  Continue current management of symptoms. 3.  Continue use of resting splint at night-time and as needed during the day. 4.  Suggest surgical evaluation.      Nerve Conduction Studies Anti Sensory Summary Table   Stim Site NR Peak (ms) Norm Peak (ms) P-T Amp (V) Norm P-T Amp Site1 Site2 Delta-P (ms) Dist (cm) Vel (m/s) Norm Vel (m/s)  Left Median Acr Palm Anti Sensory (2nd Digit)  33.1C  Wrist    *4.5 <3.6 18.2 >10 Wrist Palm 2.8 0.0    Palm    1.7 <2.0 27.9         Right Median Acr Palm Anti Sensory (2nd Digit)  32.9C  Wrist    *4.2 <3.6 19.1 >10 Wrist Palm 2.2 0.0      Palm    2.0 <2.0 22.7         Left Radial Anti Sensory (Base 1st Digit)  33.2C  Wrist    1.8 <3.1 46.4  Wrist Base 1st Digit 1.8 0.0    Right Radial Anti Sensory (Base 1st Digit)  33.2C  Wrist    1.9 <3.1 38.3  Wrist Base 1st Digit 1.9 0.0    Left Ulnar Anti Sensory (5th Digit)  33.4C  Wrist    2.9 <3.7 31.3 >15.0 Wrist 5th Digit 2.9 14.0 48 >38  Right Ulnar Anti Sensory (5th Digit)  33.2C  Wrist    3.0 <3.7 21.0 >15.0 Wrist 5th Digit 3.0 14.0 47 >38   Motor Summary Table   Stim Site NR Onset (ms) Norm Onset (ms) O-P Amp (mV) Norm O-P Amp Site1 Site2 Delta-0 (ms) Dist (cm) Vel (m/s) Norm Vel (m/s)  Left Median Motor (Abd Poll Brev)  33.2C  Wrist    *4.3 <4.2 7.2 >5 Elbow Wrist 3.9 20.5 53 >50  Elbow    8.2  6.9         Right Median Motor (Abd Poll Brev)  33.2C  Wrist    *5.6 <4.2 *4.5 >5 Elbow Wrist 4.6 20.5 *45 >50  Elbow    10.2  4.1         Left Ulnar Motor (Abd Dig Min)  33.1C  Wrist    2.5 <4.2 6.7 >3 B Elbow Wrist 2.8 19.5 70 >53  B Elbow    5.3  7.1  A Elbow B Elbow 1.2 9.5 79 >53  A Elbow    6.5  7.2         Right Ulnar Motor (Abd Dig Min)  33.3C  Wrist    2.7 <4.2 7.5 >3 B Elbow Wrist 2.8 19.5 70 >53  B Elbow    5.5  7.8  A Elbow B Elbow 1.1 9.0 82 >53  A Elbow    6.6  7.9          EMG   Side Muscle Nerve Root Ins Act Fibs Psw Amp Dur Poly Recrt Int Dennie BiblePat Comment  Right Abd Poll Brev Median C8-T1 Nml Nml Nml Nml Nml 0 Nml Nml   Right 1stDorInt Ulnar C8-T1 Nml Nml Nml Nml Nml 0 Nml Nml   Right PronatorTeres Median C6-7 Nml Nml Nml Nml Nml 0 Nml Nml   Right Biceps Musculocut C5-6 Nml Nml Nml Nml Nml 0 Nml Nml   Right Deltoid Axillary C5-6 Nml Nml Nml Nml Nml 0 Nml Nml     Nerve Conduction Studies Anti Sensory Left/Right Comparison   Stim Site L Lat (ms) R Lat (ms) L-R Lat (ms) L Amp (V) R Amp (V) L-R Amp (%) Site1 Site2 L Vel (m/s) R Vel (m/s) L-R Vel (m/s)  Median Acr Palm Anti Sensory (2nd Digit)  33.1C  Wrist *4.5 *4.2 0.3 18.2 19.1 4.7 Wrist Palm      Palm 1.7 2.0 0.3 27.9 22.7 18.6       Radial Anti Sensory (Base 1st Digit)  33.2C  Wrist 1.8 1.9 0.1 46.4 38.3 17.5 Wrist Base 1st Digit     Ulnar Anti Sensory (5th Digit)  33.4C  Wrist 2.9 3.0 0.1 31.3 21.0 32.9 Wrist 5th Digit 48 47 1   Motor Left/Right Comparison   Stim Site L Lat (ms) R Lat (ms) L-R Lat (ms) L Amp (mV) R Amp (mV) L-R Amp (%) Site1 Site2 L Vel (m/s) R Vel (m/s) L-R Vel (m/s)  Median Motor (Abd Poll Brev)  33.2C  Wrist *4.3 *5.6 *1.3 7.2 *4.5 37.5 Elbow Wrist 53 *45 8  Elbow 8.2 10.2 2.0 6.9 4.1 40.6       Ulnar Motor (Abd Dig Min)  33.1C  Wrist 2.5 2.7 0.2 6.7 7.5 10.7 B Elbow Wrist 70 70 0  B Elbow 5.3 5.5 0.2 7.1 7.8 9.0 A Elbow B Elbow 79 82 3  A Elbow 6.5 6.6 0.1 7.2 7.9 8.9          Waveforms:

## 2017-02-03 ENCOUNTER — Encounter (INDEPENDENT_AMBULATORY_CARE_PROVIDER_SITE_OTHER): Payer: Self-pay | Admitting: Orthopaedic Surgery

## 2017-02-03 ENCOUNTER — Ambulatory Visit (INDEPENDENT_AMBULATORY_CARE_PROVIDER_SITE_OTHER): Payer: 59 | Admitting: Physician Assistant

## 2017-02-03 DIAGNOSIS — G5603 Carpal tunnel syndrome, bilateral upper limbs: Secondary | ICD-10-CM | POA: Diagnosis not present

## 2017-02-03 MED ORDER — METHYLPREDNISOLONE ACETATE 40 MG/ML IJ SUSP
40.0000 mg | INTRAMUSCULAR | Status: AC | PRN
  Administered 2017-02-03: 40 mg

## 2017-02-03 MED ORDER — LIDOCAINE HCL 1 % IJ SOLN
1.0000 mL | INTRAMUSCULAR | Status: AC | PRN
  Administered 2017-02-03: 1 mL

## 2017-02-03 NOTE — Progress Notes (Signed)
Megan Lara returns today status post upper extremities nerve conduction studies.  She underwent nerve conduction studies of the upper extremities by Dr. Alvester Lara on 01/13/2017.  This showed moderate carpal tunnel syndrome on the right and mild carpal tunnel syndrome on the left.  She states both hands bother her.  She is having waking numbness tingling in the hands despite wearing carpal tunnel splints.  She is also 7 weeks postpartum and is breast-feeding.  Review of systems: Please see HPI otherwise negative  Physical exam: Bilateral hands no muscle atrophy.  Subjective numbness in the median distribution of both hands.  Impression: Bilateral carpal tunnel syndrome right moderate left mild  Plan: She will continue to use her night splints.  Discussed right carpal tunnel release with her versus cortisone injection.  She like to try cortisone injection on the right.  Did discuss with her avoid breast-feeding for the next 8-12 hours.  We will see her back in 8 weeks to check her progress lack of.  If she continues to have numbness tingling symptoms particularly on the right would recommend right carpal tunnel release.

## 2017-02-03 NOTE — Progress Notes (Signed)
   Procedure Note  Patient: Megan Lara             Date of Birth: 10/20/1980           MRN: [id number]             Visit Date: 02/03/2017  Procedures: Visit Diagnoses: Carpal tunnel syndrome, bilateral  Hand/UE Inj: R carpal tunnel for carpal tunnel syndrome on 02/03/2017 5:40 PM Indications: therapeutic Details: 25 G needle, volar approach Medications: 1 mL lidocaine 1 %; 40 mg methylPREDNISolone acetate 40 MG/ML Consent was given by the patient. Patient was prepped and draped in the usual sterile fashion.

## 2017-02-05 DIAGNOSIS — E039 Hypothyroidism, unspecified: Secondary | ICD-10-CM | POA: Diagnosis not present

## 2017-03-31 ENCOUNTER — Encounter (INDEPENDENT_AMBULATORY_CARE_PROVIDER_SITE_OTHER): Payer: Self-pay | Admitting: Orthopaedic Surgery

## 2017-03-31 ENCOUNTER — Ambulatory Visit (INDEPENDENT_AMBULATORY_CARE_PROVIDER_SITE_OTHER): Payer: 59 | Admitting: Orthopaedic Surgery

## 2017-03-31 DIAGNOSIS — G5603 Carpal tunnel syndrome, bilateral upper limbs: Secondary | ICD-10-CM

## 2017-03-31 NOTE — Progress Notes (Signed)
The patient comes in for follow-up after having a steroid injection in transverse carpal ligament on the right side due to moderate carpal tunnel syndrome confirmed with nerve conduction studies.  We decided to go the conservative route for the fact that this had come on after a recent pregnancy.  She said since the injection she is doing great and does not have any numbness and tingling anymore and is doing much better overall to the point that she is not using a splint at night.  On exam today her exam is basically normal with excellent pinch and grip strength and no numbness and tingling bilaterally.  We a long discussion about her going back to a night splint if she starts developing numbness and tingling again in at least considering one more injection down the road only if needed.  All questions concerns were answered and addressed.  She will follow-up as needed.

## 2017-05-12 DIAGNOSIS — E039 Hypothyroidism, unspecified: Secondary | ICD-10-CM | POA: Diagnosis not present

## 2017-05-12 DIAGNOSIS — M542 Cervicalgia: Secondary | ICD-10-CM | POA: Diagnosis not present

## 2017-07-15 DIAGNOSIS — R070 Pain in throat: Secondary | ICD-10-CM | POA: Diagnosis not present

## 2017-07-15 DIAGNOSIS — J0100 Acute maxillary sinusitis, unspecified: Secondary | ICD-10-CM | POA: Diagnosis not present

## 2017-07-15 DIAGNOSIS — R062 Wheezing: Secondary | ICD-10-CM | POA: Diagnosis not present

## 2017-07-15 DIAGNOSIS — F419 Anxiety disorder, unspecified: Secondary | ICD-10-CM | POA: Insufficient documentation

## 2017-07-15 DIAGNOSIS — F32A Depression, unspecified: Secondary | ICD-10-CM | POA: Insufficient documentation

## 2017-07-20 DIAGNOSIS — J45909 Unspecified asthma, uncomplicated: Secondary | ICD-10-CM | POA: Insufficient documentation

## 2017-09-20 ENCOUNTER — Ambulatory Visit (HOSPITAL_COMMUNITY)
Admission: EM | Admit: 2017-09-20 | Discharge: 2017-09-20 | Disposition: A | Discharge status: Home / Self Care | Payer: 59 | Attending: Family Medicine | Admitting: Family Medicine

## 2017-09-20 ENCOUNTER — Encounter (HOSPITAL_COMMUNITY): Payer: Self-pay | Admitting: *Deleted

## 2017-09-20 DIAGNOSIS — Z23 Encounter for immunization: Secondary | ICD-10-CM | POA: Diagnosis not present

## 2017-09-20 DIAGNOSIS — W260XXA Contact with knife, initial encounter: Secondary | ICD-10-CM | POA: Diagnosis not present

## 2017-09-20 DIAGNOSIS — S61412A Laceration without foreign body of left hand, initial encounter: Secondary | ICD-10-CM | POA: Diagnosis not present

## 2017-09-20 MED ORDER — TETANUS-DIPHTH-ACELL PERTUSSIS 5-2.5-18.5 LF-MCG/0.5 IM SUSP
INTRAMUSCULAR | Status: AC
  Filled 2017-09-20: qty 0.5

## 2017-09-20 MED ORDER — TETANUS-DIPHTH-ACELL PERTUSSIS 5-2.5-18.5 LF-MCG/0.5 IM SUSP
0.5000 mL | Freq: Once | INTRAMUSCULAR | Status: AC
  Administered 2017-09-20: 0.5 mL via INTRAMUSCULAR

## 2017-09-20 MED ORDER — LIDOCAINE HCL (PF) 2 % IJ SOLN
INTRAMUSCULAR | Status: AC
  Filled 2017-09-20: qty 2

## 2017-09-20 NOTE — Discharge Instructions (Addendum)
Tetanus updated.  2 sutures placed.  You can remove current dressing in 24 hours.  Afterwards, you can clean gently with soap and water.  Do not soak wound in water.  Ibuprofen for pain.  Monitor for spreading redness, increased warmth, fever, follow-up for reevaluation.  Otherwise follow-up here with PCP for suture removal in 7 days.

## 2017-09-20 NOTE — ED Triage Notes (Signed)
Pt states she cut her hand with a pocket knife at about 5pm today.

## 2017-09-20 NOTE — ED Provider Notes (Signed)
[facility]    CSN: [id number] Arrival date & time: 09/20/17  1748     History   Chief Complaint Chief Complaint  Patient presents with  . Extremity Laceration    left hand    HPI Megan Lara is a 37 y.o. female.   37 year old female comes in for laceration of the left palm around the first MCP area.  Bleeding controlled.  Patient states she was trying to fix that washer, when a box cutter caused a laceration.  She has numbness tingling that is normal for her carpal tunnel, no changes from baseline.  She is able to move all her fingers without difficulty.  Unknown last tetanus.     Past Medical History:  Diagnosis Date  . Adjustment disorder with mixed anxiety and depressed mood 12/17/2016  . Anemia   . Anemia affecting pregnancy, antepartum 12/17/2016  . Asthma   . Chorioamnionitis, delivered, current hospitalization 12/17/2016  . Depression   . Hypothyroidism 12/17/2016  . Thyroid dysfunction in pregnancy in third trimester     Patient Active Problem List   Diagnosis Date Noted  . Carpal tunnel syndrome, left upper limb 01/13/2017  . Carpal tunnel syndrome, right upper limb 01/13/2017  . Cesarean delivery delivered: indication arrest of dilation; chorioamnionitis 12/17/2016  . Postpartum care following cesarean delivery (10/15) 12/17/2016  . Chorioamnionitis 12/17/2016  . Chorioamnionitis, delivered, current hospitalization 12/17/2016  . Anemia affecting pregnancy, IDA of pregnancy 12/17/2016  . Hypothyroidism 12/17/2016  . Adjustment disorder with mixed anxiety and depressed mood 12/17/2016    Past Surgical History:  Procedure Laterality Date  . CESAREAN SECTION N/A 12/16/2016   Procedure: CESAREAN SECTION;  Surgeon: Shea Evans, MD;  Location: [facility] BIRTHING SUITES;  Service: Obstetrics;  Laterality: N/A;  . GANGLION CYST EXCISION Left 4/15   3 rd toe  . HERNIA REPAIR Right 1991  . TONSILLECTOMY  1997    OB History    Gravida  1   Para  1   Term  1   Preterm      AB      Living        SAB      TAB      Ectopic      Multiple  0   Live Births               Home Medications    Prior to Admission medications   Medication Sig Start Date End Date Taking? Authorizing Provider  busPIRone (BUSPAR) 5 MG tablet Take 5 mg by mouth 2 (two) times daily.   Yes Emergency, Nurse, RN  cetirizine (ZYRTEC) 10 MG tablet Take 10 mg by mouth daily as needed for allergies.    Yes [provider]  ibuprofen (ADVIL,MOTRIN) 600 MG tablet Take 1 tablet (600 mg total) by mouth every 6 (six) hours. 12/19/16  Yes Marlinda Mike, CNM  levothyroxine (SYNTHROID, LEVOTHROID) 50 MCG tablet 1 TABLET ONCE A DAY,TAKE IN AN EMPTY STOMACH/1ST THING IN AM ORALLY 30 10/05/15  Yes [provider]  levothyroxine (SYNTHROID, LEVOTHROID) 75 MCG tablet Take 75 mcg by mouth daily before breakfast. Takes every other day--alternates with tablets   Yes [provider]  sertraline (ZOLOFT) 50 MG tablet Take 1 tablet by mouth daily. 04/23/16  Yes [provider]  acetaminophen (TYLENOL) 325 MG tablet Take 650 mg by mouth every 6 (six) hours as needed for mild pain or headache.    [provider]  albuterol (PROVENTIL HFA;VENTOLIN HFA)  108 (90 BASE) MCG/ACT inhaler Inhale 2 puffs into the lungs every 6 (six) hours as needed for wheezing.    [provider]  famotidine (PEPCID) 20 MG tablet Take 20 mg by mouth 2 (two) times daily as needed for heartburn or indigestion.    [provider]    Family History Family History  Adopted: Yes  Problem Relation Age of Onset  . Uterine cancer Maternal Grandmother     Social History Social History   Tobacco Use  . Smoking status: Former Smoker    Packs/day: 0.50    Years: 7.00    Pack years: 3.50    Types: Cigarettes    Last attempt to quit: 03/04/2013    Years since quitting: 4.5  . Smokeless tobacco: Never Used  . Tobacco comment: does  some vape  Substance Use Topics  . Alcohol use: Yes    Alcohol/week: 0.0 oz    Comment: 9 a week  . Drug use: No     Allergies   Patient has no known allergies.   Review of Systems Review of Systems  Reason unable to perform ROS: See HPI as above.     Physical Exam Triage Vital Signs ED Triage Vitals  Enc Vitals Group     BP 09/20/17 1843 115/66     Pulse Rate 09/20/17 1843 70     Resp 09/20/17 1843 18     Temp 09/20/17 1843 98.1 F (36.7 C)     Temp Source 09/20/17 1843 Oral     SpO2 09/20/17 1843 100 %     Weight 09/20/17 1845 160 lb (72.6 kg)     Height --      Head Circumference --      Peak Flow --      Pain Score 09/20/17 1845 5     Pain Loc --      Pain Edu? --      Excl. in GC? --    No data found.  Updated Vital Signs BP 115/66   Pulse 70   Temp 98.1 F (36.7 C) (Oral)   Resp 18   Wt 160 lb (72.6 kg)   LMP 08/08/2017   SpO2 100%   BMI 27.46 kg/m   Physical Exam  Constitutional: She is oriented to person, place, and time. She appears well-developed and well-nourished. No distress.  HENT:  Head: Normocephalic and atraumatic.  Eyes: Pupils are equal, round, and reactive to light. Conjunctivae are normal.  Musculoskeletal:  1 cm oblique laceration to the left palm mid MCP area.  Bleeding controlled.  Tenderness to palpation around the area.  Full range of motion of fingers.  Strength deferred.  Sensation intact.  Radial pulse 2+ and equal bilaterally.  Cap refill less than 2 seconds.  Neurological: She is alert and oriented to person, place, and time.  Skin: She is not diaphoretic.     UC Treatments / Results  Labs (all labs ordered are listed, but only abnormal results are displayed) Labs Reviewed - No data to display  EKG None  Radiology No results found.  Procedures Laceration Repair Date/Time: 09/20/2017 7:34 PM Performed by: Belinda FisherYu, Amy V, PA-C Authorized by: Doreene ElandEniola, Kehinde T, MD   Consent:    Consent obtained:  Verbal    Consent given by:  Patient   Risks discussed:  Infection, pain, poor cosmetic result and poor wound healing   Alternatives discussed:  No treatment Anesthesia (see MAR for exact dosages):    Anesthesia method:  Local infiltration   Local anesthetic:  Lidocaine 2% w/o epi Laceration details:    Location:  Hand   Hand location:  L palm   Length (cm):  1   Depth (mm):  2 Repair type:    Repair type:  Simple Pre-procedure details:    Preparation:  Patient was prepped and draped in usual sterile fashion Exploration:    Hemostasis achieved with:  Direct pressure   Wound exploration: wound explored through full range of motion and entire depth of wound probed and visualized   Treatment:    Area cleansed with:  Hibiclens   Amount of cleaning:  Standard   Irrigation solution:  Sterile saline   Irrigation method:  Syringe and tap   Visualized foreign bodies/material removed: no   Mucous membrane repair:    Wound mucous membrane closure material used: prolene. Skin repair:    Repair method:  Sutures   Suture size:  6-0   Suture material:  Prolene   Suture technique:  Simple interrupted   Number of sutures:  2 Approximation:    Approximation:  Close Post-procedure details:    Dressing:  Non-adherent dressing   Patient tolerance of procedure:  Tolerated well, no immediate complications   (including critical care time)  Medications Ordered in UC Medications  Tdap (BOOSTRIX) injection 0.5 mL (0.5 mLs Intramuscular Given 09/20/17 1939)    Initial Impression / Assessment and Plan / UC Course  I have reviewed the triage vital signs and the nursing notes.  Pertinent labs & imaging results that were available during my care of the patient were reviewed by me and considered in my medical decision making (see chart for details).    Patient tolerated procedure well.  Tdap updated.  2 sutures applied.  Wound care instructions given.  Return precautions given.  Otherwise follow-up in 7 days  for suture removal.  Patient expresses understanding and agrees to plan.  Final Clinical Impressions(s) / UC Diagnoses   Final diagnoses:  Laceration of left hand without foreign body, initial encounter    ED Prescriptions    None        Belinda Fisher, PA-C 09/20/17 1949

## 2017-09-30 DIAGNOSIS — Z789 Other specified health status: Secondary | ICD-10-CM | POA: Diagnosis not present

## 2017-09-30 DIAGNOSIS — Z3201 Encounter for pregnancy test, result positive: Secondary | ICD-10-CM | POA: Diagnosis not present

## 2017-10-03 DIAGNOSIS — Z3201 Encounter for pregnancy test, result positive: Secondary | ICD-10-CM | POA: Diagnosis not present

## 2017-10-24 DIAGNOSIS — Z118 Encounter for screening for other infectious and parasitic diseases: Secondary | ICD-10-CM | POA: Diagnosis not present

## 2017-10-24 DIAGNOSIS — O09521 Supervision of elderly multigravida, first trimester: Secondary | ICD-10-CM | POA: Diagnosis not present

## 2017-10-24 LAB — OB RESULTS CONSOLE ANTIBODY SCREEN: ANTIBODY SCREEN: NEGATIVE

## 2017-10-24 LAB — OB RESULTS CONSOLE RPR: RPR: NONREACTIVE

## 2017-10-24 LAB — OB RESULTS CONSOLE HIV ANTIBODY (ROUTINE TESTING): HIV: NONREACTIVE

## 2017-10-24 LAB — OB RESULTS CONSOLE GC/CHLAMYDIA
Chlamydia: NEGATIVE
GC PROBE AMP, GENITAL: NEGATIVE

## 2017-10-24 LAB — OB RESULTS CONSOLE ABO/RH: RH Type: POSITIVE

## 2017-10-24 LAB — OB RESULTS CONSOLE HEPATITIS B SURFACE ANTIGEN: Hepatitis B Surface Ag: NEGATIVE

## 2017-10-24 LAB — OB RESULTS CONSOLE RUBELLA ANTIBODY, IGM: RUBELLA: IMMUNE

## 2017-11-24 DIAGNOSIS — R070 Pain in throat: Secondary | ICD-10-CM | POA: Diagnosis not present

## 2017-11-24 DIAGNOSIS — R05 Cough: Secondary | ICD-10-CM | POA: Diagnosis not present

## 2017-11-24 DIAGNOSIS — R0981 Nasal congestion: Secondary | ICD-10-CM | POA: Diagnosis not present

## 2017-11-27 DIAGNOSIS — J45909 Unspecified asthma, uncomplicated: Secondary | ICD-10-CM | POA: Diagnosis not present

## 2017-12-23 DIAGNOSIS — Z23 Encounter for immunization: Secondary | ICD-10-CM | POA: Diagnosis not present

## 2017-12-23 DIAGNOSIS — E039 Hypothyroidism, unspecified: Secondary | ICD-10-CM | POA: Diagnosis not present

## 2017-12-23 DIAGNOSIS — O09522 Supervision of elderly multigravida, second trimester: Secondary | ICD-10-CM | POA: Diagnosis not present

## 2018-04-28 LAB — OB RESULTS CONSOLE GBS: GBS: NEGATIVE

## 2018-05-20 ENCOUNTER — Other Ambulatory Visit: Payer: Self-pay | Admitting: Obstetrics & Gynecology

## 2018-05-21 ENCOUNTER — Encounter (HOSPITAL_COMMUNITY): Payer: Self-pay | Admitting: *Deleted

## 2018-05-22 ENCOUNTER — Encounter (HOSPITAL_COMMUNITY)
Admission: RE | Admit: 2018-05-22 | Discharge: 2018-05-22 | Disposition: A | Discharge status: Home / Self Care | Payer: 59 | Source: Ambulatory Visit | Attending: Obstetrics & Gynecology | Admitting: Obstetrics & Gynecology

## 2018-05-22 ENCOUNTER — Other Ambulatory Visit: Payer: Self-pay

## 2018-05-22 DIAGNOSIS — Z01812 Encounter for preprocedural laboratory examination: Secondary | ICD-10-CM

## 2018-05-22 HISTORY — DX: Personal history of diseases of the skin and subcutaneous tissue: Z87.2

## 2018-05-22 LAB — ABO/RH: ABO/RH(D): A POS

## 2018-05-22 LAB — TYPE AND SCREEN
ABO/RH(D): A POS
ANTIBODY SCREEN: NEGATIVE

## 2018-05-22 LAB — CBC
HCT: 35.9 % — ABNORMAL LOW (ref 36.0–46.0)
Hemoglobin: 11.2 g/dL — ABNORMAL LOW (ref 12.0–15.0)
MCH: 31.1 pg (ref 26.0–34.0)
MCHC: 31.2 g/dL (ref 30.0–36.0)
MCV: 99.7 fL (ref 80.0–100.0)
NRBC: 0 % (ref 0.0–0.2)
Platelets: 248 10*3/uL (ref 150–400)
RBC: 3.6 MIL/uL — ABNORMAL LOW (ref 3.87–5.11)
RDW: 13.2 % (ref 11.5–15.5)
WBC: 11.9 10*3/uL — AB (ref 4.0–10.5)

## 2018-05-22 NOTE — Patient Instructions (Signed)
Megan Lara  05/22/2018   Your procedure is scheduled on:  05/25/2018  Arrive at 0800 at Entrance C on [employer] at [facility] and [employer]. You are invited to use the FREE valet parking or use the Visitor's parking deck.  Pick up the phone at the desk and dial [phone].  Call this number if you have problems the morning of surgery: [phone]  Remember:   Do not eat food:(After Midnight) Desps de medianoche.  Do not drink clear liquids: (After Midnight) Desps de medianoche.  Take these medicines the morning of surgery with A SIP OF WATER:  Bring your inhaler.  Take zoloft, buspar synthroid and prevacid as prescribed   Do not wear jewelry, make-up or nail polish.  Do not wear lotions, powders, or perfumes. Do not wear deodorant.  Do not shave 48 hours prior to surgery.  Do not bring valuables to the hospital.  [facility] is not   responsible for any belongings or valuables brought to the hospital.  Contacts, dentures or bridgework may not be worn into surgery.  Leave suitcase in the car. After surgery it may be brought to your room.  For patients admitted to the hospital, checkout time is 11:00 AM the day of              discharge.      Please read over the following fact sheets that you were given:     Preparing for Surgery

## 2018-05-23 LAB — RPR: RPR Ser Ql: NONREACTIVE

## 2018-05-24 NOTE — H&P (Signed)
Megan Lara is a 38 y.o. female presenting for repeat C-section at wants tubal ligation. She is 40.3 wks, with no start of labor, was hoping for TOLAC but didn't go into labor and now ready for repeat C-section, she desires permanent sterilization as well.  AMA, Asthma, Anxiety/ depression, Hypothyroidism on Synthroid. Prior smoker.  Pregnancy overall uncomplicated, took Synthroid, Zoloft. Normal growth sono in 3rd trim. AFI was 27 cm at 36 wks but reduced at 20 cm at 40 wks. NST wkly from 36 wks for hypothyroidism - reactive.   OB History    Gravida  2   Para  1   Term  1   Preterm      AB      Living        SAB      TAB      Ectopic      Multiple  0   Live Births             Past Medical History:  Diagnosis Date  . Adjustment disorder with mixed anxiety and depressed mood 12/17/2016  . Anemia   . Anemia affecting pregnancy, antepartum 12/17/2016  . Asthma   . Chorioamnionitis, delivered, current hospitalization 12/17/2016  . Depression   . Hx of cellulitis of skin with lymphangitis    from last CS  . Hypothyroidism 12/17/2016  . Thyroid dysfunction in pregnancy in third trimester    Past Surgical History:  Procedure Laterality Date  . CESAREAN SECTION N/A 12/16/2016   Procedure: CESAREAN SECTION;  Surgeon: Shea Evans, MD;  Location: [facility] BIRTHING SUITES;  Service: Obstetrics;  Laterality: N/A;  . GANGLION CYST EXCISION Left 4/15   3 rd toe  . HERNIA REPAIR Right 1991  . TONSILLECTOMY  1997   Family History: family history includes Uterine cancer in her maternal grandmother. She was adopted. Social History:  reports that she quit smoking about 5 years ago. Her smoking use included cigarettes. She has a 3.50 pack-year smoking history. She has never used smokeless tobacco. She reports current alcohol use. She reports that she does not use drugs.     Maternal Diabetes: No Genetic Screening: Normal Maternal Ultrasounds/Referrals: Normal Fetal  Ultrasounds or other Referrals:  None Maternal Substance Abuse:  No Significant Maternal Medications:  Meds include: Zoloft Syntroid Significant Maternal Lab Results:  Lab values include: Group B Strep negative Other Comments:  None  ROS neg  History   Last menstrual period 08/08/2017, unknown if currently breastfeeding. Exam Physical Exam  BP 127/89   Pulse 92   Temp 98.9 F (37.2 C)   Resp 18   Ht 5\' 4"  (1.626 m)   Wt 78 kg   LMP 08/08/2017   SpO2 100%   BMI 29.52 kg/m   A&O x 3, no acute distress. Pleasant HEENT neg, no thyromegaly Lungs CTA bilat CV RRR, S1S2 normal Abdo soft, non tender, non acute Extr no edema/ tenderness Pelvic deferred FHT  130s Toco  none  Prenatal labs: ABO, Rh: --/--/A POS, A POS Performed at [facility], [address], [place], [place] [address]  [phone]) Antibody: NEG (03/20 1037) Rubella: Immune (08/23 0000) RPR: Non Reactive (03/20 1050)  HBsAg: Negative (08/23 0000)  HIV: Non-reactive (08/23 0000)  GBS: Negative (02/25 0000)   Assessment/Plan: 38 yo G2P1001, 40.3 wks, here for repeat C-section since no onset of labor. Wants permanent sterilization with tubal ligation/ salpingectomy.  Risks/complications of surgery reviewed incl infection, bleeding, damage to internal organs  including bladder, bowels, ureters, blood vessels, other risks from anesthesia, VTE and delayed complications of any surgery, complications in future surgery reviewed. Also discussed neonatal complications incl difficult delivery, laceration, vacuum assistance, TTN etc. Pt understands and agrees, all concerns addressed.     Robley Fries 05/24/2018, 9:49 PM

## 2018-05-25 ENCOUNTER — Inpatient Hospital Stay (HOSPITAL_COMMUNITY)
Admission: RE | Admit: 2018-05-25 | Discharge: 2018-05-27 | DRG: 784 | Disposition: A | Discharge status: Home / Self Care | Payer: 59 | Attending: Obstetrics & Gynecology | Admitting: Obstetrics & Gynecology

## 2018-05-25 ENCOUNTER — Inpatient Hospital Stay (HOSPITAL_COMMUNITY): Payer: 59

## 2018-05-25 ENCOUNTER — Other Ambulatory Visit: Payer: Self-pay

## 2018-05-25 ENCOUNTER — Encounter (HOSPITAL_COMMUNITY)
Admission: RE | Disposition: A | Discharge status: Home / Self Care | Payer: Self-pay | Source: Home / Self Care | Attending: Obstetrics & Gynecology

## 2018-05-25 ENCOUNTER — Encounter (HOSPITAL_COMMUNITY): Payer: Self-pay | Admitting: *Deleted

## 2018-05-25 DIAGNOSIS — O34211 Maternal care for low transverse scar from previous cesarean delivery: Secondary | ICD-10-CM | POA: Diagnosis present

## 2018-05-25 DIAGNOSIS — O99284 Endocrine, nutritional and metabolic diseases complicating childbirth: Secondary | ICD-10-CM | POA: Diagnosis present

## 2018-05-25 DIAGNOSIS — Z98891 History of uterine scar from previous surgery: Secondary | ICD-10-CM

## 2018-05-25 DIAGNOSIS — D62 Acute posthemorrhagic anemia: Secondary | ICD-10-CM | POA: Diagnosis not present

## 2018-05-25 DIAGNOSIS — Z302 Encounter for sterilization: Secondary | ICD-10-CM

## 2018-05-25 DIAGNOSIS — O9081 Anemia of the puerperium: Secondary | ICD-10-CM | POA: Diagnosis not present

## 2018-05-25 DIAGNOSIS — J45909 Unspecified asthma, uncomplicated: Secondary | ICD-10-CM | POA: Diagnosis present

## 2018-05-25 DIAGNOSIS — E039 Hypothyroidism, unspecified: Secondary | ICD-10-CM | POA: Diagnosis present

## 2018-05-25 DIAGNOSIS — F419 Anxiety disorder, unspecified: Secondary | ICD-10-CM | POA: Diagnosis present

## 2018-05-25 DIAGNOSIS — F329 Major depressive disorder, single episode, unspecified: Secondary | ICD-10-CM | POA: Diagnosis present

## 2018-05-25 DIAGNOSIS — Z3A40 40 weeks gestation of pregnancy: Secondary | ICD-10-CM | POA: Diagnosis not present

## 2018-05-25 DIAGNOSIS — Z87891 Personal history of nicotine dependence: Secondary | ICD-10-CM

## 2018-05-25 DIAGNOSIS — O99344 Other mental disorders complicating childbirth: Secondary | ICD-10-CM | POA: Diagnosis present

## 2018-05-25 DIAGNOSIS — O9952 Diseases of the respiratory system complicating childbirth: Secondary | ICD-10-CM | POA: Diagnosis present

## 2018-05-25 SURGERY — Surgical Case
Anesthesia: Spinal | Laterality: Bilateral

## 2018-05-25 MED ORDER — SERTRALINE HCL 100 MG PO TABS
100.0000 mg | ORAL_TABLET | Freq: Every day | ORAL | Status: DC
  Administered 2018-05-25 – 2018-05-26 (×2): 100 mg via ORAL
  Filled 2018-05-25 (×2): qty 1

## 2018-05-25 MED ORDER — LORATADINE 10 MG PO TABS
10.0000 mg | ORAL_TABLET | Freq: Every day | ORAL | Status: DC
  Administered 2018-05-25 – 2018-05-27 (×3): 10 mg via ORAL
  Filled 2018-05-25 (×3): qty 1

## 2018-05-25 MED ORDER — DIPHENHYDRAMINE HCL 25 MG PO CAPS: 25.0000 mg | ORAL_CAPSULE | Freq: Four times a day (QID) | ORAL | Status: DC | PRN

## 2018-05-25 MED ORDER — KETOROLAC TROMETHAMINE 30 MG/ML IJ SOLN
INTRAMUSCULAR | Status: AC
  Filled 2018-05-25: qty 1

## 2018-05-25 MED ORDER — LACTATED RINGERS IV SOLN
INTRAVENOUS | Status: DC
  Administered 2018-05-25: 16:00:00 via INTRAVENOUS

## 2018-05-25 MED ORDER — FENTANYL CITRATE (PF) 100 MCG/2ML IJ SOLN
INTRAMUSCULAR | Status: AC
  Filled 2018-05-25: qty 2

## 2018-05-25 MED ORDER — FENTANYL CITRATE (PF) 100 MCG/2ML IJ SOLN
INTRAMUSCULAR | Status: DC | PRN
  Administered 2018-05-25: 15 ug via INTRATHECAL

## 2018-05-25 MED ORDER — ACETAMINOPHEN 10 MG/ML IV SOLN: 1000.0000 mg | Freq: Once | INTRAVENOUS | Status: DC | PRN

## 2018-05-25 MED ORDER — CEFAZOLIN SODIUM-DEXTROSE 2-4 GM/100ML-% IV SOLN
INTRAVENOUS | Status: AC
  Filled 2018-05-25: qty 100

## 2018-05-25 MED ORDER — ONDANSETRON HCL 4 MG/2ML IJ SOLN
INTRAMUSCULAR | Status: AC
  Filled 2018-05-25: qty 2

## 2018-05-25 MED ORDER — LACTATED RINGERS IV SOLN
INTRAVENOUS | Status: DC | PRN
  Administered 2018-05-25: 10:00:00 via INTRAVENOUS

## 2018-05-25 MED ORDER — LACTATED RINGERS IV SOLN
INTRAVENOUS | Status: DC
  Administered 2018-05-25 (×3): via INTRAVENOUS

## 2018-05-25 MED ORDER — BUSPIRONE HCL 5 MG PO TABS
5.0000 mg | ORAL_TABLET | Freq: Two times a day (BID) | ORAL | Status: DC
  Administered 2018-05-25 – 2018-05-27 (×4): 5 mg via ORAL
  Filled 2018-05-25 (×4): qty 1

## 2018-05-25 MED ORDER — SODIUM CHLORIDE 0.9% FLUSH: 3.0000 mL | INTRAVENOUS | Status: DC | PRN

## 2018-05-25 MED ORDER — DIPHENHYDRAMINE HCL 25 MG PO CAPS
25.0000 mg | ORAL_CAPSULE | ORAL | Status: DC | PRN
  Administered 2018-05-25: 25 mg via ORAL
  Filled 2018-05-25: qty 1

## 2018-05-25 MED ORDER — OXYTOCIN 40 UNITS IN NORMAL SALINE INFUSION - SIMPLE MED: 2.5000 [IU]/h | INTRAVENOUS | Status: AC

## 2018-05-25 MED ORDER — NALBUPHINE HCL 10 MG/ML IJ SOLN: 5.0000 mg | INTRAMUSCULAR | Status: DC | PRN

## 2018-05-25 MED ORDER — ONDANSETRON HCL 4 MG/2ML IJ SOLN
INTRAMUSCULAR | Status: DC | PRN
  Administered 2018-05-25: 4 mg via INTRAVENOUS

## 2018-05-25 MED ORDER — LEVOTHYROXINE SODIUM 50 MCG PO TABS
50.0000 ug | ORAL_TABLET | ORAL | Status: DC
  Administered 2018-05-27: 50 ug via ORAL
  Filled 2018-05-25: qty 1

## 2018-05-25 MED ORDER — BUPIVACAINE IN DEXTROSE 0.75-8.25 % IT SOLN
INTRATHECAL | Status: DC | PRN
  Administered 2018-05-25: 1.8 mg via INTRATHECAL

## 2018-05-25 MED ORDER — DIBUCAINE 1 % RE OINT: 1.0000 "application " | TOPICAL_OINTMENT | RECTAL | Status: DC | PRN

## 2018-05-25 MED ORDER — SCOPOLAMINE 1 MG/3DAYS TD PT72
1.0000 | MEDICATED_PATCH | Freq: Once | TRANSDERMAL | Status: DC
  Administered 2018-05-25: 1.5 mg via TRANSDERMAL

## 2018-05-25 MED ORDER — PANTOPRAZOLE SODIUM 20 MG PO TBEC
20.0000 mg | DELAYED_RELEASE_TABLET | Freq: Every day | ORAL | Status: DC
  Administered 2018-05-25 – 2018-05-27 (×3): 20 mg via ORAL
  Filled 2018-05-25 (×3): qty 1

## 2018-05-25 MED ORDER — COCONUT OIL OIL: 1.0000 "application " | TOPICAL_OIL | Status: DC | PRN

## 2018-05-25 MED ORDER — ACETAMINOPHEN 325 MG PO TABS
650.0000 mg | ORAL_TABLET | ORAL | Status: DC | PRN
  Administered 2018-05-25 (×2): 650 mg via ORAL
  Filled 2018-05-25 (×2): qty 2

## 2018-05-25 MED ORDER — SENNOSIDES-DOCUSATE SODIUM 8.6-50 MG PO TABS
2.0000 | ORAL_TABLET | ORAL | Status: DC
  Administered 2018-05-25 – 2018-05-26 (×2): 2 via ORAL
  Filled 2018-05-25 (×2): qty 2

## 2018-05-25 MED ORDER — NALBUPHINE HCL 10 MG/ML IJ SOLN: 5.0000 mg | Freq: Once | INTRAMUSCULAR | Status: DC | PRN

## 2018-05-25 MED ORDER — WITCH HAZEL-GLYCERIN EX PADS: 1.0000 "application " | MEDICATED_PAD | CUTANEOUS | Status: DC | PRN

## 2018-05-25 MED ORDER — ALBUTEROL SULFATE (2.5 MG/3ML) 0.083% IN NEBU: 2.5000 mg | INHALATION_SOLUTION | Freq: Four times a day (QID) | RESPIRATORY_TRACT | Status: DC | PRN

## 2018-05-25 MED ORDER — NALOXONE HCL 0.4 MG/ML IJ SOLN: 0.4000 mg | INTRAMUSCULAR | Status: DC | PRN

## 2018-05-25 MED ORDER — SCOPOLAMINE 1 MG/3DAYS TD PT72
MEDICATED_PATCH | TRANSDERMAL | Status: AC
  Filled 2018-05-25: qty 1

## 2018-05-25 MED ORDER — NALOXONE HCL 4 MG/10ML IJ SOLN
1.0000 ug/kg/h | INTRAVENOUS | Status: DC | PRN
  Filled 2018-05-25: qty 5

## 2018-05-25 MED ORDER — PHENYLEPHRINE HCL-NACL 20-0.9 MG/250ML-% IV SOLN
INTRAVENOUS | Status: AC
  Filled 2018-05-25: qty 250

## 2018-05-25 MED ORDER — SODIUM CHLORIDE 0.9 % IV SOLN
INTRAVENOUS | Status: DC | PRN
  Administered 2018-05-25: 60 ug/min via INTRAVENOUS

## 2018-05-25 MED ORDER — SIMETHICONE 80 MG PO CHEW
80.0000 mg | CHEWABLE_TABLET | Freq: Three times a day (TID) | ORAL | Status: DC
  Administered 2018-05-25 – 2018-05-27 (×4): 80 mg via ORAL
  Filled 2018-05-25 (×4): qty 1

## 2018-05-25 MED ORDER — MENTHOL 3 MG MT LOZG: 1.0000 | LOZENGE | OROMUCOSAL | Status: DC | PRN

## 2018-05-25 MED ORDER — OXYTOCIN 40 UNITS IN NORMAL SALINE INFUSION - SIMPLE MED
INTRAVENOUS | Status: AC
  Filled 2018-05-25: qty 1000

## 2018-05-25 MED ORDER — ONDANSETRON HCL 4 MG/2ML IJ SOLN: 4.0000 mg | Freq: Three times a day (TID) | INTRAMUSCULAR | Status: DC | PRN

## 2018-05-25 MED ORDER — KETOROLAC TROMETHAMINE 30 MG/ML IJ SOLN
30.0000 mg | Freq: Once | INTRAMUSCULAR | Status: AC | PRN
  Administered 2018-05-25: 30 mg via INTRAVENOUS

## 2018-05-25 MED ORDER — SIMETHICONE 80 MG PO CHEW
80.0000 mg | CHEWABLE_TABLET | ORAL | Status: DC
  Administered 2018-05-25 – 2018-05-26 (×2): 80 mg via ORAL
  Filled 2018-05-25 (×2): qty 1

## 2018-05-25 MED ORDER — IBUPROFEN 800 MG PO TABS
800.0000 mg | ORAL_TABLET | Freq: Three times a day (TID) | ORAL | Status: DC
  Administered 2018-05-25 – 2018-05-27 (×6): 800 mg via ORAL
  Filled 2018-05-25 (×6): qty 1

## 2018-05-25 MED ORDER — SIMETHICONE 80 MG PO CHEW: 80.0000 mg | CHEWABLE_TABLET | ORAL | Status: DC | PRN

## 2018-05-25 MED ORDER — PRENATAL MULTIVITAMIN CH
1.0000 | ORAL_TABLET | Freq: Every day | ORAL | Status: DC
  Administered 2018-05-26: 1 via ORAL
  Filled 2018-05-25: qty 1

## 2018-05-25 MED ORDER — DIPHENHYDRAMINE HCL 50 MG/ML IJ SOLN
12.5000 mg | INTRAMUSCULAR | Status: DC | PRN
  Administered 2018-05-25: 12.5 mg via INTRAVENOUS
  Filled 2018-05-25: qty 1

## 2018-05-25 MED ORDER — TETANUS-DIPHTH-ACELL PERTUSSIS 5-2.5-18.5 LF-MCG/0.5 IM SUSP: 0.5000 mL | Freq: Once | INTRAMUSCULAR | Status: DC

## 2018-05-25 MED ORDER — MORPHINE SULFATE (PF) 0.5 MG/ML IJ SOLN
INTRAMUSCULAR | Status: AC
  Filled 2018-05-25: qty 10

## 2018-05-25 MED ORDER — LEVOTHYROXINE SODIUM 75 MCG PO TABS
75.0000 ug | ORAL_TABLET | ORAL | Status: DC
  Administered 2018-05-26: 75 ug via ORAL
  Filled 2018-05-25: qty 1

## 2018-05-25 MED ORDER — ZOLPIDEM TARTRATE 5 MG PO TABS: 5.0000 mg | ORAL_TABLET | Freq: Every evening | ORAL | Status: DC | PRN

## 2018-05-25 MED ORDER — MORPHINE SULFATE (PF) 0.5 MG/ML IJ SOLN
INTRAMUSCULAR | Status: DC | PRN
  Administered 2018-05-25: .15 mg via INTRATHECAL

## 2018-05-25 MED ORDER — OXYCODONE HCL 5 MG PO TABS
5.0000 mg | ORAL_TABLET | ORAL | Status: DC | PRN
  Administered 2018-05-25: 10 mg via ORAL
  Administered 2018-05-26: 5 mg via ORAL
  Administered 2018-05-26 (×2): 10 mg via ORAL
  Administered 2018-05-26: 5 mg via ORAL
  Administered 2018-05-27 (×2): 10 mg via ORAL
  Filled 2018-05-25: qty 1
  Filled 2018-05-25 (×4): qty 2
  Filled 2018-05-25: qty 1
  Filled 2018-05-25: qty 2

## 2018-05-25 MED ORDER — CEFAZOLIN SODIUM-DEXTROSE 2-4 GM/100ML-% IV SOLN
2.0000 g | INTRAVENOUS | Status: AC
  Administered 2018-05-25: 2 g via INTRAVENOUS

## 2018-05-25 MED ORDER — OXYTOCIN 10 UNIT/ML IJ SOLN
INTRAVENOUS | Status: DC | PRN
  Administered 2018-05-25: 40 [IU] via INTRAVENOUS

## 2018-05-25 SURGICAL SUPPLY — 38 items
APL SKNCLS STERI-STRIP NONHPOA (GAUZE/BANDAGES/DRESSINGS) ×1
BENZOIN TINCTURE PRP APPL 2/3 (GAUZE/BANDAGES/DRESSINGS) ×2 IMPLANT
CHLORAPREP W/TINT 26ML (MISCELLANEOUS) ×3 IMPLANT
CLAMP CORD UMBIL (MISCELLANEOUS) IMPLANT
CLOSURE WOUND 1/2 X4 (GAUZE/BANDAGES/DRESSINGS) ×1
CLOTH BEACON ORANGE TIMEOUT ST (SAFETY) ×3 IMPLANT
DRSG OPSITE POSTOP 4X10 (GAUZE/BANDAGES/DRESSINGS) ×3 IMPLANT
ELECT REM PT RETURN 9FT ADLT (ELECTROSURGICAL) ×3
ELECTRODE REM PT RTRN 9FT ADLT (ELECTROSURGICAL) ×1 IMPLANT
EXTRACTOR VACUUM KIWI (MISCELLANEOUS) IMPLANT
EXTRACTOR VACUUM M CUP 4 TUBE (SUCTIONS) IMPLANT
EXTRACTOR VACUUM M CUP 4' TUBE (SUCTIONS)
GLOVE BIO SURGEON STRL SZ7 (GLOVE) ×3 IMPLANT
GLOVE BIOGEL PI IND STRL 7.0 (GLOVE) ×3 IMPLANT
GLOVE BIOGEL PI INDICATOR 7.0 (GLOVE) ×6
GOWN STRL REUS W/TWL LRG LVL3 (GOWN DISPOSABLE) ×9 IMPLANT
KIT ABG SYR 3ML LUER SLIP (SYRINGE) IMPLANT
NDL HYPO 25X5/8 SAFETYGLIDE (NEEDLE) IMPLANT
NEEDLE HYPO 25X5/8 SAFETYGLIDE (NEEDLE) IMPLANT
NS IRRIG 1000ML POUR BTL (IV SOLUTION) ×3 IMPLANT
PACK C SECTION WH (CUSTOM PROCEDURE TRAY) ×3 IMPLANT
PAD ABD DERMACEA PRESS 5X9 (GAUZE/BANDAGES/DRESSINGS) ×2 IMPLANT
PAD OB MATERNITY 4.3X12.25 (PERSONAL CARE ITEMS) ×3 IMPLANT
RTRCTR C-SECT PINK 25CM LRG (MISCELLANEOUS) IMPLANT
STRIP CLOSURE SKIN 1/2X4 (GAUZE/BANDAGES/DRESSINGS) ×1 IMPLANT
SUT MNCRL 0 VIOLET CTX 36 (SUTURE) ×2 IMPLANT
SUT MONOCRYL 0 CTX 36 (SUTURE) ×4
SUT PLAIN 0 NONE (SUTURE) IMPLANT
SUT PLAIN 2 0 (SUTURE)
SUT PLAIN ABS 2-0 CT1 27XMFL (SUTURE) IMPLANT
SUT VIC AB 0 CT1 27 (SUTURE) ×6
SUT VIC AB 0 CT1 27XBRD ANBCTR (SUTURE) ×2 IMPLANT
SUT VIC AB 2-0 CT1 27 (SUTURE) ×3
SUT VIC AB 2-0 CT1 TAPERPNT 27 (SUTURE) ×1 IMPLANT
SUT VIC AB 4-0 KS 27 (SUTURE) ×3 IMPLANT
TOWEL OR 17X24 6PK STRL BLUE (TOWEL DISPOSABLE) ×3 IMPLANT
TRAY FOLEY W/BAG SLVR 14FR LF (SET/KITS/TRAYS/PACK) IMPLANT
WATER STERILE IRR 1000ML POUR (IV SOLUTION) ×3 IMPLANT

## 2018-05-25 NOTE — Progress Notes (Signed)
Pt OOB to bathroom. Ambulating without difficulty. Foley was d/c'd per order. Peri care performed. Pt tolerating PO fluids. IV saline locked also per order. OB assessment WNL. Carmelina Dane, RN

## 2018-05-25 NOTE — Anesthesia Preprocedure Evaluation (Signed)
Anesthesia Evaluation  Patient identified by MRN, date of birth, ID band Patient awake    Reviewed: Allergy & Precautions, NPO status , Patient's Chart, lab work & pertinent test results  Airway Mallampati: II  TM Distance: >3 FB Neck ROM: Full    Dental no notable dental hx.    Pulmonary asthma , former smoker,    Pulmonary exam normal breath sounds clear to auscultation       Cardiovascular negative cardio ROS Normal cardiovascular exam Rhythm:Regular Rate:Normal     Neuro/Psych PSYCHIATRIC DISORDERS Depression negative neurological ROS     GI/Hepatic negative GI ROS, Neg liver ROS,   Endo/Other  Hypothyroidism   Renal/GU negative Renal ROS  negative genitourinary   Musculoskeletal negative musculoskeletal ROS (+)   Abdominal   Peds  Hematology negative hematology ROS (+)   Anesthesia Other Findings Repeat C/S  Reproductive/Obstetrics (+) Pregnancy                             Anesthesia Physical Anesthesia Plan  ASA: II  Anesthesia Plan: Spinal   Post-op Pain Management:    Induction:   PONV Risk Score and Plan: Treatment may vary due to age or medical condition  Airway Management Planned: Natural Airway  Additional Equipment:   Intra-op Plan:   Post-operative Plan:   Informed Consent: I have reviewed the patients History and Physical, chart, labs and discussed the procedure including the risks, benefits and alternatives for the proposed anesthesia with the patient or authorized representative who has indicated his/her understanding and acceptance.     Dental advisory given  Plan Discussed with: CRNA  Anesthesia Plan Comments:         Anesthesia Quick Evaluation

## 2018-05-25 NOTE — Progress Notes (Addendum)
MOB was referred for history of depression/anxiety. * Referral screened out by Clinical Social Worker because none of the following criteria appear to apply: ~ History of anxiety/depression during this pregnancy, or of post-partum depression following prior delivery. ~ Diagnosis of anxiety and/or depression within last 3 years OR * MOB's symptoms currently being treated with medication and/or therapy. Per chart review and PNC records-pt MOB on 100mg of Zoloft and 5mg Buspar.  Please contact the Clinical Social Worker if needs arise, by MOB request, or if MOB scores greater than 9/yes to question 10 on Edinburgh Postpartum Depression Screen.     S. , MSW, LCSW-[facility] [place]  [phone] 

## 2018-05-25 NOTE — Anesthesia Postprocedure Evaluation (Signed)
Anesthesia Post Note  Patient: Megan Lara  Procedure(s) Performed: Repeat CESAREAN SECTION WITH BILATERAL TUBAL LIGATION (Bilateral )     Patient location during evaluation: PACU Anesthesia Type: Spinal Level of consciousness: oriented and awake and alert Pain management: pain level controlled Vital Signs Assessment: post-procedure vital signs reviewed and stable Respiratory status: spontaneous breathing, respiratory function stable and patient connected to nasal cannula oxygen Cardiovascular status: blood pressure returned to baseline and stable Postop Assessment: no headache, no backache and no apparent nausea or vomiting Anesthetic complications: no    Last Vitals:  Vitals:   05/25/18 1130 05/25/18 1140  BP: 109/61   Pulse: 84   Resp: 19   Temp:  (!) 36.2 C  SpO2: 100%     Last Pain:  Vitals:   05/25/18 1140  TempSrc: Oral   Pain Goal:    LLE Motor Response: Purposeful movement (05/25/18 1130)   RLE Motor Response: Purposeful movement (05/25/18 1130)       Epidural/Spinal Function Cutaneous sensation: Tingles (05/25/18 1130), Patient able to flex knees: Yes (05/25/18 1130), Patient able to lift hips off bed: No (05/25/18 1130), Back pain beyond tenderness at insertion site: No (05/25/18 1130), Progressively worsening motor and/or sensory loss: No (05/25/18 1130), Bowel and/or bladder incontinence post epidural: No (05/25/18 1130)   L 

## 2018-05-25 NOTE — Transfer of Care (Signed)
Immediate Anesthesia Transfer of Care Note  Patient: Megan Lara  Procedure(s) Performed: Repeat CESAREAN SECTION WITH BILATERAL TUBAL LIGATION (Bilateral )  Patient Location: PACU  Anesthesia Type:Spinal  Level of Consciousness: awake, alert  and oriented  Airway & Oxygen Therapy: Patient Spontanous Breathing  Post-op Assessment: Report given to RN and Post -op Vital signs reviewed and stable  Post vital signs: Reviewed and stable  Last Vitals:  Vitals Value Taken Time  BP 110/91 05/25/2018 10:31 AM  Temp    Pulse 93 05/25/2018 10:34 AM  Resp 19 05/25/2018 10:34 AM  SpO2 100 % 05/25/2018 10:34 AM  Vitals shown include unvalidated device data.  Last Pain:  Vitals:   05/25/18 1030  TempSrc: (P) Oral         Complications: No apparent anesthesia complications

## 2018-05-25 NOTE — Op Note (Signed)
Cesarean Section Procedure Note   Megan Lara  05/25/2018  Indications: Scheduled Proceedure/Maternal Request, 40.3 weeks. Desiring permanent sterilization   Procedure: Repeat Low transverse C-section and bilateral tubal sterilization by salpingectomy  Pre-operative Diagnosis: Previous Cesarean Section, Desires Sterilization.   Post-operative Diagnosis: Same   Surgeon: Shea Evans, MD - Primary   Assistants: Marlinda Mike, CNM  Anesthesia: spinal   Procedure Details:  The patient was seen in the Holding Room. The risks, benefits, complications, treatment options, and expected outcomes were discussed with the patient. The patient concurred with the proposed plan, giving informed consent. identified as [name] and the procedure verified as C-Section Delivery. A Time Out was held and the above information confirmed.  After induction of anesthesia, the patient was draped and prepped in the usual sterile manner, foley was draining urine well.  A pfannenstiel incision was made and carried down through the subcutaneous tissue to the fascia. Fascial incision was made and extended transversely. The fascia was separated from the underlying rectus tissue superiorly and inferiorly. The peritoneum was identified and entered. Peritoneal incision was extended longitudinally. Alexis-O retractor placed. The utero-vesical peritoneal reflection was incised transversely and the bladder flap was bluntly freed from the lower uterine segment. A low transverse uterine incision was made. Delivered from cephalic presentation was a Female infant with vigorous cry. Apgar scores of 9 at one minute and 9 at five minutes. Delayed cord clamping done at 1 minute and baby handed to NICU team in attendance. Cord ph was not sent. Cord blood was obtained for evaluation. The placenta was removed Intact and appeared normal. The uterine outline, tubes and ovaries appeared normal}. The uterine incision was  closed with running locked sutures of . A second imbricating layer sutured.   Hemostasis was observed.  Sterilization done by bilateral salpingectomy using 2-0 Plain gut ties, created window in mesosalpinx and tied around the tube's proximal end and around tubo-ovarian area. Tubes sent to pathology. Hemostasis excellent.  Alexis retractor removed. Peritoneal closure done with 2-0 Vicryl.  The fascia was then reapproximated with running sutures of 0Vicryl. The subcuticular closure was performed using 2-0plain gut. The skin was closed with 4-0Vicryl.   Instrument, sponge, and needle counts were correct prior the abdominal closure and were correct at the conclusion of the case.    Findings: Clear amniotic fluid. Kerr hysterotomy for cephalic delivery of female infant. Vigorous cry. Normal cord, placenta, tubes and ovaries    Estimated Blood Loss: 166 mL   Total IV Fluids: 2100 ml LR  Urine Output: 200CC OF clear urine  Specimens: bilateral fallopian tubes  Complications: no complications  Disposition: PACU - hemodynamically stable.   Maternal Condition: stable   Baby condition / location:  Couplet care / Skin to Skin  Attending Attestation: I performed the procedure.   Signed: Surgeon(s): Shea Evans, MD

## 2018-05-25 NOTE — Anesthesia Procedure Notes (Signed)
Spinal  Patient location during procedure: OR Start time: 05/25/2018 9:00 AM End time: 05/25/2018 9:10 AM Staffing Anesthesiologist: Elmer Picker, MD Performed: anesthesiologist  Preanesthetic Checklist Completed: patient identified, surgical consent, pre-op evaluation, timeout performed, IV checked, risks and benefits discussed and monitors and equipment checked Spinal Block Patient position: sitting Prep: site prepped and draped and DuraPrep Patient monitoring: cardiac monitor, continuous pulse ox and blood pressure Approach: midline Location: L3-4 Injection technique: single-shot Needle Needle type: Pencan  Needle gauge: 24 G Needle length: 9 cm Assessment Sensory level: T6 Additional Notes Functioning IV was confirmed and monitors were applied. Sterile prep and drape, including hand hygiene and sterile gloves were used. The patient was positioned and the spine was prepped. The skin was anesthetized with lidocaine.  Free flow of clear CSF was obtained prior to injecting local anesthetic into the CSF.  The spinal needle aspirated freely following injection.  The needle was carefully withdrawn.  The patient tolerated the procedure well.

## 2018-05-26 LAB — CBC
HEMATOCRIT: 31.4 % — AB (ref 36.0–46.0)
Hemoglobin: 10.1 g/dL — ABNORMAL LOW (ref 12.0–15.0)
MCH: 31.5 pg (ref 26.0–34.0)
MCHC: 32.2 g/dL (ref 30.0–36.0)
MCV: 97.8 fL (ref 80.0–100.0)
NRBC: 0 % (ref 0.0–0.2)
Platelets: 221 10*3/uL (ref 150–400)
RBC: 3.21 MIL/uL — ABNORMAL LOW (ref 3.87–5.11)
RDW: 13.2 % (ref 11.5–15.5)
WBC: 12.3 10*3/uL — AB (ref 4.0–10.5)

## 2018-05-26 LAB — BIRTH TISSUE RECOVERY COLLECTION (PLACENTA DONATION)

## 2018-05-26 NOTE — Progress Notes (Signed)
Patient ID: Megan Lara, female   DOB: 09-20-1980, 38 y.o.   MRN: [id number] Subjective: POD# 1 Live born female  Birth Weight: 8 lb 6.2 oz (3805 g) APGAR: 8, 8  Newborn Delivery   Birth date/time:  05/25/2018 09:36:00 Delivery type:  C-Section, Low Transverse Trial of labor:  Yes C-section categorization:  Repeat    Baby name: Leah Delivering provider: MODY, VAISHALI   Feeding: breast  Pain control at delivery: Spinal   Reports feeling well, sleepy.  Patient reports tolerating PO.   Breast symptoms: none Pain controlled with PO meds Denies HA/SOB/C/P/N/V/dizziness. Flatus present. She reports vaginal bleeding as normal, without clots.  She is ambulating, urinating without difficulty.     Objective:   VS:    Vitals:   05/25/18 1700 05/25/18 1920 05/25/18 2326 05/26/18 0334  BP:  (!) 104/58 111/75 115/75  Pulse:  65 63 68  Resp:  16 18 16   Temp:  98.6 F (37 C) 99.3 F (37.4 C) 99.2 F (37.3 C)  TempSrc:  Oral Oral Oral  SpO2: 98% 100% 98% 99%  Weight:      Height:          Intake/Output Summary (Last 24 hours) at 05/26/2018 0934 Last data filed at 05/26/2018 1194 Gross per 24 hour  Intake 3141.25 ml  Output 1766 ml  Net 1375.25 ml        Recent Labs    05/26/18 0532  WBC 12.3*  HGB 10.1*  HCT 31.4*  PLT 221     Blood type: --/--/A POS (03/23 0820)  Rubella: Immune (08/23 0000)  Vaccines: TDaP UTD         Flu    UTD   Physical Exam:  General: alert, cooperative and no distress CV: Regular rate and rhythm Resp: clear Abdomen: soft, nontender, normal bowel sounds Incision: pressure dressing removed, intact and serous and 1/2 of HC dressing drainage present Uterine Fundus: firm, below umbilicus, nontender Lochia: minimal Ext: no edema, redness or tenderness in the calves or thighs      Assessment/Plan: 38 y.o.   POD# 1. [id number]                  Principal Problem:   Postpartum care following cesarean delivery (3/23) Active  Problems:   Status post repeat low transverse cesarean section and BTL   Doing well, stable.     Dressing change after shower          Advance diet as tolerated Encourage rest when baby rests Breastfeeding support Encourage to ambulate Routine post-op care  Neta Mends, CNM, MSN 05/26/2018, 9:34 AM

## 2018-05-26 NOTE — Lactation Note (Addendum)
This note was copied from a baby's chart. Lactation Consultation Note  Patient Name: Megan Lara [id number] Date: 05/26/2018   g2p2 csection delivery. Maternal request to see lactation. Mom reports her first baby was tongue tied and never breastfed well.  Mom reports she pumped for three months.  Did not have tongue clipped/just stretched itself. Mom with hx anxiety and depression.  Mom reports she would like to make sure she is latched on correctly.  Offered to observe a feeding.  Infant in crib cuing sucking on her hands.  Mom reports she is quiet now so does not want to feed her.  Mom reports just ate not to long ago.reviewed hunger cues with mom and he reports she will feed her later.  Urged her to call lactation as needed.  Maternal Data    Feeding Feeding Type: Breast Fed  [facility] Score                   Interventions    Lactation Tools Discussed/Used     Consult Status       Michaelle Copas 05/26/2018, 12:13 AM

## 2018-05-27 MED ORDER — FUSION PLUS PO CAPS: 1.0000 | ORAL_CAPSULE | Freq: Every day | ORAL | 3 refills | Status: DC

## 2018-05-27 MED ORDER — IBUPROFEN 800 MG PO TABS: 800.0000 mg | ORAL_TABLET | Freq: Three times a day (TID) | ORAL | 0 refills | Status: DC

## 2018-05-27 MED ORDER — OXYCODONE HCL 5 MG PO TABS: 5.0000 mg | ORAL_TABLET | ORAL | 0 refills | Status: DC | PRN

## 2018-05-27 NOTE — Progress Notes (Signed)
POSTOPERATIVE DAY # 2 S/P Repeat LTCS and bilateral tubal sterilization by salpingectomy, baby girl "Megan Lara"   S:         Reports feeling better today; desires early d/c home today   Reports some anxiety about incision due to hx of cellulitis with last c/s             Tolerating po intake / no nausea / no vomiting / + flatus / no BM  Denies dizziness, SOB, or CP             Bleeding is light             Pain controlled with Motrin, Tylenol, Oxycodone IR             Up ad lib / ambulatory/ voiding QS  Newborn breast feeding - going well per patient    O:  VS: BP 123/75 (BP Location: Right Arm)   Pulse 75   Temp 98 F (36.7 C) (Oral)   Resp 18   Ht 5\' 4"  (1.626 m)   Wt 78 kg   LMP 08/08/2017   SpO2 100%   Breastfeeding Unknown   BMI 29.52 kg/m    LABS:               Recent Labs    05/26/18 0532  WBC 12.3*  HGB 10.1*  PLT 221               Bloodtype: --/--/A POS (03/23 0820)  Rubella: Immune (08/23 0000)                                             I&O: Intake/Output      03/24 0701 - 03/25 0700 03/25 0701 - 03/26 0700   P.O.     I.V. (mL/kg)     Total Intake(mL/kg)     Urine (mL/kg/hr)     Blood     Total Output     Net                       Physical Exam:             Alert and Oriented X3  Lungs: Clear and unlabored  Heart: regular rate and rhythm / no murmurs  Abdomen: soft, non-tender, non-distended, abdominal binder on removed for assessment; active bowel sounds in all quadrants              Fundus: firm, non-tender, U-2             Dressing: honeycomb with steristrips c/d/i              Incision:  approximated with sutures / no erythema / no ecchymosis / no drainage  Perineum: intact  Lochia: scant, no clots   Extremities: no edema, no calf pain or tenderness,   A:        POD # 2 S/P Repeat LTCS            bilateral tubal sterilization by salpingectomy  ABL Anemia  Hx. Of Hypothyroidism - on Synthroid 50mg  and 75mg  every other day  Hx. Of Anxiety and  Depression - on Zoloft 100mg  and Buspar 5mg  BID  P:        Routine postoperative care              Discharge home today  [facility] discharge book given, instructions and warning s/s reviewed   PPD/anxiety warning s/s reviewed - will plan to f/u with PCP   Infection s/s discussed   F/u with Dr. Juliene Pina in 6 weeks    Carlean Jews, MSN, CNM [facility]

## 2018-05-27 NOTE — Discharge Summary (Signed)
Obstetric Discharge Summary   Patient Name: Megan Lara DOB: 1980-11-05 MRN: [id number]  Date of Admission: 05/25/2018 Date of Discharge: 05/27/2018 Date of Delivery: 05/25/2018 Gestational Age at Delivery: [redacted]w[redacted]d  Primary OB: [facility] - Dr. Juliene Pina  Antepartum complications:  - Prior LTCS wants tubal ligation  - AMA, Asthma, Anxiety/ depression on Zoloft and Buspar, Hypothyroidism on Synthroid. Prior smoker.  Prenatal Labs:  ABO, Rh: --/--/A POS, A POS Performed at [facility], [address], [place], [place] [address]  [phone]) Antibody: NEG (03/20 1037) Rubella: Immune (08/23 0000) RPR: Non Reactive (03/20 1050)  HBsAg: Negative (08/23 0000)  HIV: Non-reactive (08/23 0000)  GBS: Negative (02/25 0000)  Admitting Diagnosis: Repeat LTCS with permanent sterilization at 40+3 weeks   Secondary Diagnoses: Patient Active Problem List   Diagnosis Date Noted  . Status post repeat low transverse cesarean section and BTL 05/25/2018  . Postpartum care following cesarean delivery (3/23) 05/25/2018  . Carpal tunnel syndrome, left upper limb 01/13/2017  . Carpal tunnel syndrome, right upper limb 01/13/2017  . Hypothyroidism 12/17/2016  . Adjustment disorder with mixed anxiety and depressed mood 12/17/2016   Date of Delivery: 05/25/2018 Delivered By: Dr. Juliene Pina Delivery Type: repeat cesarean section, low transverse incision  Newborn Data: Live born female  Birth Weight: 8 lb 6.2 oz (3805 g) APGAR: 8, 8  Newborn Delivery   Birth date/time:  05/25/2018 09:36:00 Delivery type:  C-Section, Low Transverse Trial of labor:  Yes C-section categorization:  Repeat        Hospital/Postpartum Course  (Cesarean Section):  Pt. Admitted for repeat LTCS and desired permanent sterilization. See notes for further details. Patient had an uncomplicated postpartum course.  By time of discharge on POD#2 , her pain was controlled on oral pain medications; she had appropriate  lochia and was ambulating, voiding without difficulty, tolerating regular diet and passing flatus.   She was deemed stable for discharge to home.     Labs: CBC Latest Ref Rng & Units 05/26/2018 05/22/2018 12/18/2016  WBC 4.0 - 10.5 K/uL 12.3(H) 11.9(H) 16.5(H)  Hemoglobin 12.0 - 15.0 g/dL 10.1(L) 11.2(L) 8.7(L)  Hematocrit 36.0 - 46.0 % 31.4(L) 35.9(L) 25.8(L)  Platelets 150 - 400 K/uL 221 248 280   A POS  Physical exam:  BP 123/75 (BP Location: Right Arm)   Pulse 75   Temp 98 F (36.7 C) (Oral)   Resp 18   Ht 5\' 4"  (1.626 m)   Wt 78 kg   LMP 08/08/2017   SpO2 100%   Breastfeeding Unknown   BMI 29.52 kg/m  General: alert and no distress Pulm: normal respiratory effort Lochia: appropriate Abdomen: soft, NT Uterine Fundus: firm, below umbilicus Perineum: healing well, no significant erythema, no significant edema Incision: c/d/i, healing well, no significant drainage, no dehiscence, no significant erythema Extremities: No evidence of DVT seen on physical exam. No lower extremity edema.   Disposition: stable, discharge to home Baby Feeding: breast milk  Baby Disposition: home with mom  Contraception: s/p bilateral salpingectomy  Rh Immune globulin given: N/A Rubella vaccine given: N/A Tdap vaccine given in AP or PP setting: UTD Flu vaccine given in AP or PP setting: UTD   Plan:  Megan Lara was discharged to home in good condition. Follow-up appointment at [facility] in 6 weeks.  Discharge Instructions: Per After Visit Summary. Refer to After Visit Summary and [facility] discharge booklet  Activity: Advance as tolerated. Pelvic rest for 6 weeks.   Diet: Regular,  Heart Healthy Discharge Medications: Allergies as of 05/27/2018   No Known Allergies     Medication List    TAKE these medications   acetaminophen 325 MG tablet Commonly known as:  TYLENOL Take 650 mg by mouth every 6 (six) hours as needed for mild pain or headache.   albuterol  108 (90 Base) MCG/ACT inhaler Commonly known as:  PROVENTIL HFA;VENTOLIN HFA Inhale 2 puffs into the lungs every 6 (six) hours as needed for wheezing.   busPIRone 5 MG tablet Commonly known as:  BUSPAR Take 5 mg by mouth 2 (two) times daily.   cetirizine 10 MG tablet Commonly known as:  ZYRTEC Take 10 mg by mouth at bedtime.   Fusion Plus Caps Take 1 capsule by mouth at bedtime.   ibuprofen 800 MG tablet Commonly known as:  ADVIL,MOTRIN Take 1 tablet (800 mg total) by mouth every 8 (eight) hours.   lansoprazole 15 MG capsule Commonly known as:  PREVACID Take 15 mg by mouth at bedtime.   levothyroxine 75 MCG tablet Commonly known as:  SYNTHROID, LEVOTHROID Take 75 mcg by mouth every other day.   levothyroxine 50 MCG tablet Commonly known as:  SYNTHROID, LEVOTHROID Take 50 mcg by mouth every other day.   multivitamin-prenatal 27-0.8 MG Tabs tablet Take 1 tablet by mouth at bedtime.   oxyCODONE 5 MG immediate release tablet Commonly known as:  Oxy IR/ROXICODONE Take 1-2 tablets (5-10 mg total) by mouth every 4 (four) hours as needed for moderate pain.   sertraline 100 MG tablet Commonly known as:  ZOLOFT Take 100 mg by mouth at bedtime.      Outpatient follow up:  Follow-up Information    Shea Evans, MD. Schedule an appointment as soon as possible for a visit in 6 week(s).   Specialty:  Obstetrics and Gynecology Why:  Postpartum visit  Contact information: [address] [place] [address] [phone]           Signed:  Carlean Jews, MSN, CNM [facility]

## 2018-05-28 ENCOUNTER — Inpatient Hospital Stay (HOSPITAL_COMMUNITY): Payer: 59

## 2018-05-28 ENCOUNTER — Observation Stay (HOSPITAL_COMMUNITY)
Admission: AD | Admit: 2018-05-28 | Discharge: 2018-05-29 | Disposition: A | Discharge status: Home / Self Care | Payer: 59 | Attending: Obstetrics | Admitting: Obstetrics

## 2018-05-28 ENCOUNTER — Other Ambulatory Visit: Payer: Self-pay

## 2018-05-28 ENCOUNTER — Encounter (HOSPITAL_COMMUNITY): Payer: Self-pay

## 2018-05-28 DIAGNOSIS — X58XXXA Exposure to other specified factors, initial encounter: Secondary | ICD-10-CM | POA: Diagnosis not present

## 2018-05-28 DIAGNOSIS — F329 Major depressive disorder, single episode, unspecified: Secondary | ICD-10-CM | POA: Diagnosis not present

## 2018-05-28 DIAGNOSIS — S301XXA Contusion of abdominal wall, initial encounter: Secondary | ICD-10-CM | POA: Diagnosis not present

## 2018-05-28 DIAGNOSIS — R109 Unspecified abdominal pain: Secondary | ICD-10-CM

## 2018-05-28 DIAGNOSIS — N200 Calculus of kidney: Secondary | ICD-10-CM | POA: Insufficient documentation

## 2018-05-28 DIAGNOSIS — Z79899 Other long term (current) drug therapy: Secondary | ICD-10-CM | POA: Insufficient documentation

## 2018-05-28 DIAGNOSIS — Z79890 Hormone replacement therapy (postmenopausal): Secondary | ICD-10-CM | POA: Diagnosis not present

## 2018-05-28 DIAGNOSIS — E039 Hypothyroidism, unspecified: Secondary | ICD-10-CM | POA: Insufficient documentation

## 2018-05-28 DIAGNOSIS — R58 Hemorrhage, not elsewhere classified: Secondary | ICD-10-CM

## 2018-05-28 DIAGNOSIS — Z87891 Personal history of nicotine dependence: Secondary | ICD-10-CM | POA: Diagnosis not present

## 2018-05-28 DIAGNOSIS — F419 Anxiety disorder, unspecified: Secondary | ICD-10-CM | POA: Diagnosis not present

## 2018-05-28 DIAGNOSIS — O9081 Anemia of the puerperium: Secondary | ICD-10-CM | POA: Insufficient documentation

## 2018-05-28 DIAGNOSIS — Z791 Long term (current) use of non-steroidal anti-inflammatories (NSAID): Secondary | ICD-10-CM | POA: Insufficient documentation

## 2018-05-28 DIAGNOSIS — O9989 Other specified diseases and conditions complicating pregnancy, childbirth and the puerperium: Principal | ICD-10-CM | POA: Insufficient documentation

## 2018-05-28 HISTORY — PX: IR ANGIOGRAM PELVIS SELECTIVE OR SUPRASELECTIVE: IMG661

## 2018-05-28 HISTORY — PX: IR EMBO ART  VEN HEMORR LYMPH EXTRAV  INC GUIDE ROADMAPPING: IMG5450

## 2018-05-28 HISTORY — PX: IR ANGIOGRAM SELECTIVE EACH ADDITIONAL VESSEL: IMG667

## 2018-05-28 HISTORY — PX: IR US GUIDE VASC ACCESS RIGHT: IMG2390

## 2018-05-28 LAB — COMPREHENSIVE METABOLIC PANEL
ALBUMIN: 2.4 g/dL — AB (ref 3.5–5.0)
ALT: 15 U/L (ref 0–44)
ALT: 14 U/L (ref 0–44)
AST: 22 U/L (ref 15–41)
AST: 19 U/L (ref 15–41)
Albumin: 2.3 g/dL — ABNORMAL LOW (ref 3.5–5.0)
Alkaline Phosphatase: 94 U/L (ref 38–126)
Alkaline Phosphatase: 83 U/L (ref 38–126)
Anion gap: 10 (ref 5–15)
Anion gap: 8 (ref 5–15)
BILIRUBIN TOTAL: 0.6 mg/dL (ref 0.3–1.2)
BUN: 7 mg/dL (ref 6–20)
BUN: 7 mg/dL (ref 6–20)
CALCIUM: 8.2 mg/dL — AB (ref 8.9–10.3)
CO2: 22 mmol/L (ref 22–32)
CO2: 24 mmol/L (ref 22–32)
Calcium: 8.5 mg/dL — ABNORMAL LOW (ref 8.9–10.3)
Chloride: 105 mmol/L (ref 98–111)
Chloride: 106 mmol/L (ref 98–111)
Creatinine, Ser: 0.56 mg/dL (ref 0.44–1.00)
Creatinine, Ser: 0.52 mg/dL (ref 0.44–1.00)
GFR calc Af Amer: >60 mL/min (ref >60)
GFR calc Af Amer: >60 mL/min (ref >60)
Glucose, Bld: 112 mg/dL — ABNORMAL HIGH (ref 70–99)
Glucose, Bld: 119 mg/dL — ABNORMAL HIGH (ref 70–99)
Potassium: 3.6 mmol/L (ref 3.5–5.1)
Potassium: 3.7 mmol/L (ref 3.5–5.1)
Sodium: 137 mmol/L (ref 135–145)
Sodium: 138 mmol/L (ref 135–145)
TOTAL PROTEIN: 5.3 g/dL — AB (ref 6.5–8.1)
Total Bilirubin: 0.5 mg/dL (ref 0.3–1.2)
Total Protein: 5.2 g/dL — ABNORMAL LOW (ref 6.5–8.1)

## 2018-05-28 LAB — PROTIME-INR
INR: 1 (ref 0.8–1.2)
Prothrombin Time: 12.8 seconds (ref 11.4–15.2)

## 2018-05-28 LAB — CBC WITH DIFFERENTIAL/PLATELET
Abs Immature Granulocytes: 0.2 10*3/uL — ABNORMAL HIGH (ref 0.00–0.07)
Basophils Absolute: 0 10*3/uL (ref 0.0–0.1)
Basophils Relative: 0 %
EOS ABS: 0.3 10*3/uL (ref 0.0–0.5)
Eosinophils Relative: 2 %
HCT: 30.3 % — ABNORMAL LOW (ref 36.0–46.0)
Hemoglobin: 9.7 g/dL — ABNORMAL LOW (ref 12.0–15.0)
IMMATURE GRANULOCYTES: 1 %
LYMPHS ABS: 2 10*3/uL (ref 0.7–4.0)
Lymphocytes Relative: 13 %
MCH: 31.5 pg (ref 26.0–34.0)
MCHC: 32 g/dL (ref 30.0–36.0)
MCV: 98.4 fL (ref 80.0–100.0)
Monocytes Absolute: 0.8 10*3/uL (ref 0.1–1.0)
Monocytes Relative: 6 %
NEUTROS PCT: 78 %
NRBC: 0 % (ref 0.0–0.2)
Neutro Abs: 11.4 10*3/uL — ABNORMAL HIGH (ref 1.7–7.7)
Platelets: 262 10*3/uL (ref 150–400)
RBC: 3.08 MIL/uL — ABNORMAL LOW (ref 3.87–5.11)
RDW: 13.2 % (ref 11.5–15.5)
WBC: 14.7 10*3/uL — ABNORMAL HIGH (ref 4.0–10.5)

## 2018-05-28 LAB — TYPE AND SCREEN
ABO/RH(D): A POS
ABO/RH(D): A POS
Antibody Screen: NEGATIVE
Antibody Screen: NEGATIVE

## 2018-05-28 LAB — CBC
HCT: 25.4 % — ABNORMAL LOW (ref 36.0–46.0)
HCT: 24.2 % — ABNORMAL LOW (ref 36.0–46.0)
Hemoglobin: 8.5 g/dL — ABNORMAL LOW (ref 12.0–15.0)
Hemoglobin: 7.9 g/dL — ABNORMAL LOW (ref 12.0–15.0)
MCH: 32.6 pg (ref 26.0–34.0)
MCH: 32.1 pg (ref 26.0–34.0)
MCHC: 33.5 g/dL (ref 30.0–36.0)
MCHC: 32.6 g/dL (ref 30.0–36.0)
MCV: 97.3 fL (ref 80.0–100.0)
MCV: 98.4 fL (ref 80.0–100.0)
NRBC: 0 % (ref 0.0–0.2)
NRBC: 0 % (ref 0.0–0.2)
PLATELETS: 215 10*3/uL (ref 150–400)
Platelets: 257 10*3/uL (ref 150–400)
RBC: 2.61 MIL/uL — ABNORMAL LOW (ref 3.87–5.11)
RBC: 2.46 MIL/uL — AB (ref 3.87–5.11)
RDW: 13.4 % (ref 11.5–15.5)
RDW: 13.2 % (ref 11.5–15.5)
WBC: 14.6 10*3/uL — ABNORMAL HIGH (ref 4.0–10.5)
WBC: 7.4 10*3/uL (ref 4.0–10.5)

## 2018-05-28 LAB — SAVE SMEAR(SSMR), FOR PROVIDER SLIDE REVIEW

## 2018-05-28 LAB — FIBRINOGEN: FIBRINOGEN: 588 mg/dL — AB (ref 210–475)

## 2018-05-28 LAB — APTT: APTT: 31 s (ref 24–36)

## 2018-05-28 MED ORDER — HYDROMORPHONE HCL 1 MG/ML IJ SOLN: 2.0000 mg | Freq: Once | INTRAMUSCULAR | Status: DC

## 2018-05-28 MED ORDER — MIDAZOLAM HCL 2 MG/2ML IJ SOLN
INTRAMUSCULAR | Status: AC | PRN
  Administered 2018-05-28: 1 mg via INTRAVENOUS
  Administered 2018-05-28 (×2): 0.5 mg via INTRAVENOUS

## 2018-05-28 MED ORDER — SERTRALINE HCL 50 MG PO TABS
100.0000 mg | ORAL_TABLET | Freq: Every day | ORAL | Status: DC
  Administered 2018-05-28 – 2018-05-29 (×2): 100 mg via ORAL
  Filled 2018-05-28 (×2): qty 2

## 2018-05-28 MED ORDER — HYDROMORPHONE HCL 1 MG/ML IJ SOLN
1.0000 mg | Freq: Once | INTRAMUSCULAR | Status: AC
  Administered 2018-05-28: 1 mg via INTRAVENOUS
  Filled 2018-05-28: qty 1

## 2018-05-28 MED ORDER — IOHEXOL 300 MG/ML  SOLN
250.0000 mL | Freq: Once | INTRAMUSCULAR | Status: AC | PRN
  Administered 2018-05-28: 75 mL via INTRAVENOUS

## 2018-05-28 MED ORDER — KETOROLAC TROMETHAMINE 30 MG/ML IJ SOLN
30.0000 mg | Freq: Once | INTRAMUSCULAR | Status: AC
  Administered 2018-05-28: 30 mg via INTRAVENOUS
  Filled 2018-05-28: qty 1

## 2018-05-28 MED ORDER — LEVOTHYROXINE SODIUM 50 MCG PO TABS
50.0000 ug | ORAL_TABLET | ORAL | Status: DC
  Administered 2018-05-29: 50 ug via ORAL
  Filled 2018-05-28 (×2): qty 1

## 2018-05-28 MED ORDER — LIDOCAINE HCL (PF) 1 % IJ SOLN
INTRAMUSCULAR | Status: AC | PRN
  Administered 2018-05-28: 10 mL

## 2018-05-28 MED ORDER — ONDANSETRON HCL 4 MG PO TABS: 4.0000 mg | ORAL_TABLET | Freq: Four times a day (QID) | ORAL | Status: DC | PRN

## 2018-05-28 MED ORDER — LEVOTHYROXINE SODIUM 50 MCG PO TABS: 50.0000 ug | ORAL_TABLET | Freq: Every day | ORAL | Status: DC

## 2018-05-28 MED ORDER — GUAIFENESIN 100 MG/5ML PO SOLN: 15.0000 mL | ORAL | Status: DC | PRN

## 2018-05-28 MED ORDER — FENTANYL CITRATE (PF) 100 MCG/2ML IJ SOLN
INTRAMUSCULAR | Status: AC | PRN
  Administered 2018-05-28: 25 ug via INTRAVENOUS
  Administered 2018-05-28: 50 ug via INTRAVENOUS

## 2018-05-28 MED ORDER — SODIUM CHLORIDE 0.9 % IV SOLN
510.0000 mg | Freq: Once | INTRAVENOUS | Status: AC
  Administered 2018-05-28: 510 mg via INTRAVENOUS
  Filled 2018-05-28: qty 17

## 2018-05-28 MED ORDER — IOHEXOL 300 MG/ML  SOLN
100.0000 mL | Freq: Once | INTRAMUSCULAR | Status: AC | PRN
  Administered 2018-05-28: 100 mL via INTRAVENOUS

## 2018-05-28 MED ORDER — SODIUM CHLORIDE 0.9 % IV SOLN: INTRAVENOUS | Status: DC

## 2018-05-28 MED ORDER — MIDAZOLAM HCL 2 MG/2ML IJ SOLN
INTRAMUSCULAR | Status: AC
  Filled 2018-05-28: qty 2

## 2018-05-28 MED ORDER — OXYCODONE-ACETAMINOPHEN 5-325 MG PO TABS
1.0000 | ORAL_TABLET | Freq: Four times a day (QID) | ORAL | Status: DC | PRN
  Administered 2018-05-28 – 2018-05-29 (×3): 2 via ORAL
  Filled 2018-05-28 (×3): qty 2

## 2018-05-28 MED ORDER — HYDROMORPHONE HCL 1 MG/ML IJ SOLN
1.0000 mg | INTRAMUSCULAR | Status: DC | PRN
  Administered 2018-05-29: 1 mg via INTRAVENOUS
  Filled 2018-05-28: qty 1

## 2018-05-28 MED ORDER — HYDROMORPHONE HCL 1 MG/ML IJ SOLN
2.0000 mg | Freq: Once | INTRAMUSCULAR | Status: AC
  Administered 2018-05-28: 2 mg via INTRAVENOUS
  Filled 2018-05-28: qty 2

## 2018-05-28 MED ORDER — LEVOTHYROXINE SODIUM 75 MCG PO TABS: 75.0000 ug | ORAL_TABLET | ORAL | Status: DC

## 2018-05-28 MED ORDER — FENTANYL CITRATE (PF) 100 MCG/2ML IJ SOLN
INTRAMUSCULAR | Status: AC
  Filled 2018-05-28: qty 2

## 2018-05-28 MED ORDER — MENTHOL 3 MG MT LOZG
1.0000 | LOZENGE | OROMUCOSAL | Status: DC | PRN
  Filled 2018-05-28: qty 9

## 2018-05-28 MED ORDER — ONDANSETRON HCL 4 MG/2ML IJ SOLN: 4.0000 mg | Freq: Four times a day (QID) | INTRAMUSCULAR | Status: DC | PRN

## 2018-05-28 MED ORDER — HYDROMORPHONE HCL 2 MG PO TABS
1.0000 mg | ORAL_TABLET | ORAL | Status: DC | PRN
  Administered 2018-05-29 (×2): 1 mg via ORAL
  Filled 2018-05-28 (×4): qty 1

## 2018-05-28 MED ORDER — BUSPIRONE HCL 5 MG PO TABS
5.0000 mg | ORAL_TABLET | Freq: Two times a day (BID) | ORAL | Status: DC
  Administered 2018-05-28 – 2018-05-29 (×2): 5 mg via ORAL
  Filled 2018-05-28 (×3): qty 1

## 2018-05-28 MED ORDER — LIDOCAINE HCL 1 % IJ SOLN
INTRAMUSCULAR | Status: AC
  Filled 2018-05-28: qty 20

## 2018-05-28 NOTE — Consult Note (Signed)
Chief Complaint: Patient was seen in consultation today for  Chief Complaint  Patient presents with  . Abdominal Pain   at the request of * No referring provider recorded for this case *  Referring Physician(s): * No referring provider recorded for this case *  Supervising Physician: Jolaine Click  Patient Status: [facility] - In-pt  History of Present Illness: Megan Lara is a 38 y.o. female who is 3 days post C-section who presented in the middle of the night with increasing abdominal pain.  She was on seen at the maternal admissions unit of [facility] and underwent a CT scan demonstrating a large rectus abdominis hematoma with some extension into the anterior retroperitoneal space.  She is not on anticoagulation.  She denies syncope.  Her blood pressure in the ED department was 110/60 with a pulse of 100.  The C-section was uneventful.  Past Medical History:  Diagnosis Date  . Adjustment disorder with mixed anxiety and depressed mood 12/17/2016  . Anemia   . Anemia affecting pregnancy, antepartum 12/17/2016  . Asthma   . Chorioamnionitis, delivered, current hospitalization 12/17/2016  . Depression   . Hx of cellulitis of skin with lymphangitis    from last CS  . Hypothyroidism 12/17/2016  . Thyroid dysfunction in pregnancy in third trimester     Past Surgical History:  Procedure Laterality Date  . CESAREAN SECTION N/A 12/16/2016   Procedure: CESAREAN SECTION;  Surgeon: Shea Evans, MD;  Location: [facility] BIRTHING SUITES;  Service: Obstetrics;  Laterality: N/A;  . GANGLION CYST EXCISION Left 4/15   3 rd toe  . HERNIA REPAIR Right 1991  . TONSILLECTOMY  1997    Allergies: Patient has no known allergies.  Medications: Prior to Admission medications   Medication Sig Start Date End Date Taking? Authorizing Provider  acetaminophen (TYLENOL) 325 MG tablet Take 650 mg by mouth every 6 (six) hours as needed for mild pain or headache.   Yes [provider]  busPIRone (BUSPAR) 5 MG tablet Take 5 mg by mouth 2 (two) times daily.   Yes Emergency, Nurse, RN  cetirizine (ZYRTEC) 10 MG tablet Take 10 mg by mouth at bedtime.    Yes [provider]  ibuprofen (ADVIL,MOTRIN) 800 MG tablet Take 1 tablet (800 mg total) by mouth every 8 (eight) hours. 05/27/18  Yes Sigmon, Meredith C, CNM  Iron-FA-B Cmp-C-Biot-Probiotic (FUSION PLUS) CAPS Take 1 capsule by mouth at bedtime. 05/27/18  Yes Sigmon, Scarlette Slice, CNM  lansoprazole (PREVACID) 15 MG capsule Take 15 mg by mouth at bedtime.   Yes [provider]  levothyroxine (SYNTHROID, LEVOTHROID) 50 MCG tablet Take 50 mcg by mouth every other day.  10/05/15  Yes [provider]  oxyCODONE (OXY IR/ROXICODONE) 5 MG immediate release tablet Take 1-2 tablets (5-10 mg total) by mouth every 4 (four) hours as needed for moderate pain. 05/27/18  Yes Sigmon, Scarlette Slice, CNM  sertraline (ZOLOFT) 100 MG tablet Take 100 mg by mouth at bedtime.   Yes [provider]  albuterol (PROVENTIL HFA;VENTOLIN HFA) 108 (90 BASE) MCG/ACT inhaler Inhale 2 puffs into the lungs every 6 (six) hours as needed for wheezing.    [provider]  levothyroxine (SYNTHROID, LEVOTHROID) 75 MCG tablet Take 75 mcg by mouth every other day.     [provider]  Prenatal Vit-Fe Fumarate-FA (MULTIVITAMIN-PRENATAL) 27-0.8 MG TABS tablet Take 1 tablet by mouth at bedtime.    [provider]     Family History  Adopted: Yes  Problem Relation Age of Onset  . Uterine cancer Maternal Grandmother     Social History   Socioeconomic History  . Marital status: Married    Spouse name: Not on file  . Number of children: 0  . Years of education: Not on file  . Highest education level: Not on file  Occupational History  . Not on file  Social Needs  . Financial resource strain: Not hard at all  . Food insecurity:    Worry: Never true    Inability: Never true  . Transportation needs:    Medical:  No    Non-medical: Not on file  Tobacco Use  . Smoking status: Former Smoker    Packs/day: 0.50    Years: 7.00    Pack years: 3.50    Types: Cigarettes    Last attempt to quit: 03/04/2013    Years since quitting: 5.2  . Smokeless tobacco: Never Used  . Tobacco comment: does some vape  Substance and Sexual Activity  . Alcohol use: Not Currently    Alcohol/week: 0.0 standard drinks    Comment: 9 a week  . Drug use: No  . Sexual activity: Not Currently    Partners: Male    Birth control/protection: None  Lifestyle  . Physical activity:    Days per week: Not on file    Minutes per session: Not on file  . Stress: Only a little  Relationships  . Social connections:    Talks on phone: Not on file    Gets together: Not on file    Attends religious service: Not on file    Active member of club or organization: Not on file    Attends meetings of clubs or organizations: Not on file    Relationship status: Not on file  Other Topics Concern  . Not on file  Social History Narrative  . Not on file     Review of Systems: A 12 point ROS discussed and pertinent positives are indicated in the HPI above.  All other systems are negative.  Review of Systems  Vital Signs: BP 122/76 (BP Location: Left Arm)   Pulse 99   Temp (!) 97.4 F (36.3 C) (Axillary)   Resp 20   Ht 5\' 4"  (1.626 m)   SpO2 98%   BMI 29.52 kg/m   Physical Exam Constitutional:      Appearance: She is well-developed.  Cardiovascular:     Rate and Rhythm: Tachycardia present.  Pulmonary:     Effort: Pulmonary effort is normal.  Abdominal:     General: Abdomen is protuberant. There is distension.     Tenderness: There is abdominal tenderness.  Skin:    General: Skin is warm and dry.     Coloration: Skin is pale.  Neurological:     General: No focal deficit present.     Mental Status: She is alert and oriented to person, place, and time.     Imaging: Ct Abdomen Pelvis W Contrast  Result Date: 05/28/2018  CLINICAL DATA:  Acute generalized abdominal pain. Cesarean and tubal ligation 05/24/2017 EXAM: CT ABDOMEN AND PELVIS WITH CONTRAST TECHNIQUE: Multidetector CT imaging of the abdomen and pelvis was performed using the standard protocol following bolus administration of intravenous contrast. CONTRAST:  OMNIPAQUE IOHEXOL 300 MG/ML  SOLN COMPARISON:  02/15/2012 FINDINGS: Lower chest:  No contributory findings. Hepatobiliary: No focal liver abnormality.No evidence of biliary obstruction or stone. Pancreas: Unremarkable. Spleen: Unremarkable. Adrenals/Urinary Tract: Negative adrenals. No  hydronephrosis or ureteral stone. 5 mm right upper pole renal calculus, incidental in this setting and present since at least 2013. Unremarkable bladder. Stomach/Bowel: No obstruction. No evidence of bowel inflammation. Generalized stool retention Vascular/Lymphatic: No acute vascular abnormality. No mass or adenopathy. Reproductive:Recently gravid uterus. Gas in the endometrial cavity after recent cesarean section. There is a sizable hematoma in the lower rectus abdominus musculature and in the pre vesicular extraperitoneal space, 13 x 9 by 17 cm. Wispy contrast in the lower left rectus abdominus, likely inferior epigastric branch. Other: No ascites or pneumoperitoneum. Musculoskeletal: No acute abnormalities. Critical Value/emergent results were called by telephone at the time of interpretation on 05/28/2018 at 5:26 am to Midwife [facility] , who verbally acknowledged these results. IMPRESSION: 1. Large rectus sheath and prevesicular hematoma (13 x 9 x 17 cm) with active hemorrhage in the lower left rectus sheath. 2. Right nephrolithiasis. Electronically Signed   By: Marnee Spring M.D.   On: 05/28/2018 05:27    Labs:  CBC: Recent Labs    05/22/18 1050 05/26/18 0532 05/28/18 0252 05/28/18 0552  WBC 11.9* 12.3* 14.7* 14.6*  HGB 11.2* 10.1* 9.7* 8.5*  HCT 35.9* 31.4* 30.3* 25.4*  PLT 248 221 262 257    COAGS:  Recent Labs    05/28/18 0552  INR 1.0  APTT 31    BMP: Recent Labs    05/28/18 0252  NA 137  K 3.6  CL 105  CO2 22  GLUCOSE 112*  BUN 7  CALCIUM 8.5*  CREATININE 0.56  GFRNONAA >60  GFRAA >60    LIVER FUNCTION TESTS: Recent Labs    05/28/18 0252  BILITOT 0.6  AST 22  ALT 15  ALKPHOS 94  PROT 5.2*  ALBUMIN 2.4*    TUMOR MARKERS: No results for input(s): AFPTM, CEA, CA199, CHROMGRNA in the last 8760 hours.  Assessment and Plan:  Ms. Lame underwent a C-section 3 days ago and now has a large rectus abdominis muscle hematoma with some retroperitoneal extension and active extravasation on the CT scan.  She is somewhat hypotensive relative to what her expected blood pressure would be and she does have some tachycardia.  Her hemoglobin has dropped 1.5 points.  Typically, rectus abdominis muscle hematomas should tamponade.  There is some retroperitoneal extension which is worrisome.  Catheter directed embolization would be difficult given the anatomy.  The risks regarding vessel thrombosis were explained to the patient.  Recommendations are close clinical watching over the next hour, hoping that her blood pressure and pulse will stabilize.  If she remains labile or becomes hypotensive, embolization will be recommended.  Thank you for this interesting consult.  I greatly enjoyed meeting Deniqua Marketia Tenbroeck and look forward to participating in their care.  A copy of this report was sent to the requesting provider on this date.  Electronically Signed: Art A , MD 05/28/2018, 7:03 AM   I spent a total of 40 Minutes  in face to face in clinical consultation, greater than 50% of which was counseling/coordinating care for catheter directed embolization.

## 2018-05-28 NOTE — Procedures (Signed)
Pre-procedure Diagnosis: Rectus sheath hematoma Post-procedure Diagnosis: Same  Post pelvic arteriogram and percutaneous coil embolization of 3 separate muscular branches of the medial apsect of the left inferior epigastric artery.    Complications: None Immediate  EBL: None  Keep right leg straight for 4 hrs (until 1400).    Signed: Simonne Come Pager: [phone] 05/28/2018, 10:06 AM

## 2018-05-28 NOTE — Progress Notes (Signed)
This note also relates to the following rows which could not be included: ECG Heart Rate - Cannot attach notes to unvalidated device data  Verdell Face with lactation to come see patient.

## 2018-05-28 NOTE — H&P (Signed)
Chief complaint: Abdominal pain  History of present illness:38 year old G2, P2 3 days postpartum from repeat cesarean section who presents with persistent acute onset abdominal pain. old G2, P2 3 days postpartum from repeat cesarean section who presents with persistent acute onset abdominal pain.  Patient had early discharge from her cesarean section.  Patient notes last night she tried to get up from sitting in a recliner chair at 10:30 PM felt a pop in her mid right upper abdomen and had subsequent pain and burning in her mid abdomen that started tracking into her pelvis.  Given the degree of pain patient presented to MAU  Prior to this her pain had overall been well controlled.  She is breast-feeding.  She reports minimal vaginal bleeding.  Patient notes prenatal course is significant for anxiety and depression for which she has been on Zoloft and BuSpar.  Otherwise her pregnancy history has been uncomplicated.  Past Medical History:  Diagnosis Date  . Adjustment disorder with mixed anxiety and depressed mood 12/17/2016  . Anemia   . Anemia affecting pregnancy, antepartum 12/17/2016  . Asthma   . Chorioamnionitis, delivered, current hospitalization 12/17/2016  . Depression   . Hx of cellulitis of skin with lymphangitis    from last CS  . Hypothyroidism 12/17/2016  . Thyroid dysfunction in pregnancy in third trimester     Past Surgical History:  Procedure Laterality Date  . CESAREAN SECTION N/A 12/16/2016   Procedure: CESAREAN SECTION;  Surgeon: Shea Evans, MD;  Location: [facility] BIRTHING SUITES;  Service: Obstetrics;  Laterality: N/A;  . GANGLION CYST EXCISION Left 4/15   3 rd toe  . HERNIA REPAIR Right 1991  . TONSILLECTOMY  1997     Objective: Vitals:   05/28/18 0652 05/28/18 0655 05/28/18 0701 05/28/18 0730  BP: 121/67  122/76 121/81  Pulse: (!) 102  99 (!) 106  Resp:      Temp:      TempSrc:      SpO2:  99% 98% 99%  Height:       General: Anxious, uncomfortable with movements Skin: Warm and dry but pale Abdomen: Marketed tenderness present. GU: Deferred Lower extremity: No  edema  CBC Latest Ref Rng & Units 05/28/2018 05/28/2018 05/26/2018  WBC 4.0 - 10.5 K/uL 14.6(H) 14.7(H) 12.3(H)  Hemoglobin 12.0 - 15.0 g/dL 7.3(Z) 3.2(D) 10.1(L)  Hematocrit 36.0 - 46.0 % 25.4(L) 30.3(L) 31.4(L)  Platelets 150 - 400 K/uL 257 262 221    CMP     Component Value Date/Time   NA 138 05/28/2018 0729   K 3.7 05/28/2018 0729   CL 106 05/28/2018 0729   CO2 24 05/28/2018 0729   GLUCOSE 119 (H) 05/28/2018 0729   BUN 7 05/28/2018 0729   CREATININE 0.52 05/28/2018 0729   CALCIUM 8.2 (L) 05/28/2018 0729   PROT 5.3 (L) 05/28/2018 0729   ALBUMIN 2.3 (L) 05/28/2018 0729   AST 19 05/28/2018 0729   ALT 14 05/28/2018 0729   ALKPHOS 83 05/28/2018 0729   BILITOT 0.5 05/28/2018 0729   GFRNONAA >60 05/28/2018 0729   GFRAA >60 05/28/2018 0729   INR 1.0, fibrinogen 588  CT scan: Rectus sheath hematoma 13 x 9 x 17 cm with active hemorrhage in the left lower rectus sheath likely stemming from the inferior epigastric artery.  Assessment and plan: 38 year old G2, P2 3 days postop from repeat cesarean section with new active bleeding from the inferior epigastric artery causing a large rectus sheath hematoma. G2, P2 3 days postop from repeat cesarean section with new active bleeding from the inferior epigastric artery causing a large rectus sheath hematoma.  Multiple discussions were had with the patient and the care team to include radiology, vascular interventional radiology, patient's primary OB/GYN and options included expectant  management with serial CBCs and intervention with hemodynamic instability (with hopes of self tamponade)  versus VIR attempt at arteriogram and embolization versus repeat ex lap for evacuation of hematoma and identification and suturing of bleeding vessel.  Risks and benefits of each discussed including failed VIR procedure, thrombosis with VIR procedure, other complications of VIR procedure; risks of exploratory laparotomy include unable to identify the bleeding vessel, introduction of infection.  Benefits of exploratory laparotomy include removal of painful hematoma.  Patient understands she will likely drop her  hemoglobin further in any event and accepts blood transfusion if needed.  IV fluids begun, will place Foley in VIR.  Will follow serial CBCs.  Shared decision making led to decision to proceed with attempt at embolization via VIR . await results of the IR procedure.  2-hour spent on the unit with the patient and care team developing a plan.  Lendon Colonel 05/28/2018 8:31 AM

## 2018-05-28 NOTE — Progress Notes (Signed)
Patient ID: Megan Lara, female   DOB: [birth year], 38 y.o.   MRN: [id number]   Risks and benefits of pelvic arteriogram with possible embolization were discussed with the patient including, but not limited to bleeding, infection, vascular injury or contrast induced renal failure.  This interventional procedure involves the use of X-rays and because of the nature of the planned procedure, it is possible that we will have prolonged use of X-ray fluoroscopy.  Potential radiation risks to you include (but are not limited to) the following: - A slightly elevated risk for cancer  several years later in life. This risk is typically less than 0.5% percent. This risk is low in comparison to the normal incidence of human cancer, which is 33% for women and 50% for men according to the [employer]. - Radiation induced injury can include skin redness, resembling a rash, tissue breakdown / ulcers and hair loss (which can be temporary or permanent).   The likelihood of either of these occurring depends on the difficulty of the procedure and whether you are sensitive to radiation due to previous procedures, disease, or genetic conditions.   IF your procedure requires a prolonged use of radiation, you will be notified and given written instructions for further action.  It is your responsibility to monitor the irradiated area for the 2 weeks following the procedure and to notify your physician if you are concerned that you have suffered a radiation induced injury.    All of the patient's questions were answered, patient is agreeable to proceed.  Consent signed and in chart.

## 2018-05-28 NOTE — MAU Note (Signed)
Tech offered pt bedpan to urinate, pt declined.  RN made aware.

## 2018-05-28 NOTE — MAU Note (Signed)
Pt here with c/o abdominal pain at incision site. Had C/S on Monday, was discharged today.

## 2018-05-28 NOTE — MAU Provider Note (Addendum)
History     CSN: [id number]  Arrival date and time: 05/28/18 0137   First Provider Initiated Contact with Patient 05/28/18 0215      Chief Complaint  Patient presents with  . Abdominal Pain   Megan Lara is a 38 y.o. G2P2001 at 3 days PP s/p Repeat C/S with BTL who presents for Abdominal Pain.  She states that around 11pm she stood up and felt a "pop."  She states she started to experience a burning sensation that has been constant.  She reports taking ibuprofen and 2 oxycodone at midnight without any relief of her symptoms.  She states she has been using the binder, prior to the onset of pain, but she states it now hurts to have it on.  She rates the pain at a 10/10.  Patient reports that she is breastfeeding.  She denies bowel movements, but endorses flatus.       OB History    Gravida  2   Para  2   Term  2   Preterm      AB      Living  1     SAB      TAB      Ectopic      Multiple  0   Live Births  1           Past Medical History:  Diagnosis Date  . Adjustment disorder with mixed anxiety and depressed mood 12/17/2016  . Anemia   . Anemia affecting pregnancy, antepartum 12/17/2016  . Asthma   . Chorioamnionitis, delivered, current hospitalization 12/17/2016  . Depression   . Hx of cellulitis of skin with lymphangitis    from last CS  . Hypothyroidism 12/17/2016  . Thyroid dysfunction in pregnancy in third trimester     Past Surgical History:  Procedure Laterality Date  . CESAREAN SECTION N/A 12/16/2016   Procedure: CESAREAN SECTION;  Surgeon: Shea EvansMody, Vaishali, MD;  [facility];  Service: Obstetrics;  Laterality: N/A;  . GANGLION CYST EXCISION Left 4/15   3 rd toe  . HERNIA REPAIR Right 1991  . TONSILLECTOMY  1997    Family History  Adopted: Yes  Problem Relation Age of Onset  . Uterine cancer Maternal Grandmother     Social History   Tobacco Use  . Smoking status: Former Smoker    Packs/day: 0.50     Years: 7.00    Pack years: 3.50    Types: Cigarettes    Last attempt to quit: 03/04/2013    Years since quitting: 5.2  . Smokeless tobacco: Never Used  . Tobacco comment: does some vape  Substance Use Topics  . Alcohol use: Not Currently    Alcohol/week: 0.0 standard drinks    Comment: 9 a week  . Drug use: No    Allergies: No Known Allergies  Medications Prior to Admission  Medication Sig Dispense Refill Last Dose  . acetaminophen (TYLENOL) 325 MG tablet Take 650 mg by mouth every 6 (six) hours as needed for mild pain or headache.   Past Week at Unknown time  . busPIRone (BUSPAR) 5 MG tablet Take 5 mg by mouth 2 (two) times daily.   05/27/2018 at Unknown time  . cetirizine (ZYRTEC) 10 MG tablet Take 10 mg by mouth at bedtime.    05/27/2018 at Unknown time  . ibuprofen (ADVIL,MOTRIN) 800 MG tablet Take 1 tablet (800 mg total) by mouth every 8 (eight) hours. 30 tablet 0 05/27/2018 at Unknown  time  . Iron-FA-B Cmp-C-Biot-Probiotic (FUSION PLUS) CAPS Take 1 capsule by mouth at bedtime. 30 capsule 3 Past Week at Unknown time  . lansoprazole (PREVACID) 15 MG capsule Take 15 mg by mouth at bedtime.   05/27/2018 at Unknown time  . levothyroxine (SYNTHROID, LEVOTHROID) 50 MCG tablet Take 50 mcg by mouth every other day.    05/27/2018 at Unknown time  . oxyCODONE (OXY IR/ROXICODONE) 5 MG immediate release tablet Take 1-2 tablets (5-10 mg total) by mouth every 4 (four) hours as needed for moderate pain. 30 tablet 0 05/28/2018 at Unknown time  . sertraline (ZOLOFT) 100 MG tablet Take 100 mg by mouth at bedtime.   05/27/2018 at Unknown time  . albuterol (PROVENTIL HFA;VENTOLIN HFA) 108 (90 BASE) MCG/ACT inhaler Inhale 2 puffs into the lungs every 6 (six) hours as needed for wheezing.   More than a month at Unknown time  . levothyroxine (SYNTHROID, LEVOTHROID) 75 MCG tablet Take 75 mcg by mouth every other day.    09/20/2017 at Unknown time  . Prenatal Vit-Fe Fumarate-FA (MULTIVITAMIN-PRENATAL) 27-0.8 MG TABS  tablet Take 1 tablet by mouth at bedtime.   05/21/2018 at Unknown time    Review of Systems  Constitutional: Negative for chills and fever.  Gastrointestinal: Positive for abdominal pain and nausea. Negative for vomiting.  Genitourinary: Negative for dysuria.  Musculoskeletal: Positive for back pain.  Neurological: Negative for dizziness, light-headedness and headaches.   Physical Exam   Blood pressure 112/62, pulse 98, temperature 98.6 F (37 C), temperature source Oral, resp. rate 20, height 5\' 4"  (1.626 m), SpO2 100 %, unknown if currently breastfeeding.  Physical Exam  Constitutional: She is oriented to person, place, and time. She appears well-developed and well-nourished. She appears distressed.  HENT:  Head: Normocephalic and atraumatic.  Eyes: Conjunctivae are normal.  Neck: Normal range of motion.  Cardiovascular: Normal rate.  Respiratory: Effort normal.  GI: She exhibits distension and mass (Area of notable distention in RUQ-Appears circular, but no distinctive mass palpated). There is abdominal tenderness in the right upper quadrant, right lower quadrant and suprapubic area. There is guarding.  Musculoskeletal: Normal range of motion.  Neurological: She is alert and oriented to person, place, and time.  Skin: Skin is dry.  Psychiatric: She has a normal mood and affect. Her behavior is normal.    MAU Course  Procedures   Hemoglobin & Hematocrit  CBC Latest Ref Rng & Units 05/28/2018 05/28/2018 05/26/2018  WBC 4.0 - 10.5 K/uL 14.6(H) 14.7(H) 12.3(H)  Hemoglobin 12.0 - 15.0 g/dL 0.8(Q) 7.6(P) 10.1(L)  Hematocrit 36.0 - 46.0 % 25.4(L) 30.3(L) 31.4(L)  Platelets 150 - 400 K/uL 257 262 221    Results for orders placed or performed during the hospital encounter of 05/28/18 (from the past 24 hour(s))  CBC with Differential/Platelet     Status: Abnormal   Collection Time: 05/28/18  2:52 AM  Result Value Ref Range   WBC 14.7 (H) 4.0 - 10.5 K/uL   RBC 3.08 (L) 3.87 - 5.11  MIL/uL   Hemoglobin 9.7 (L) 12.0 - 15.0 g/dL   HCT 95.0 (L) 93.2 - 67.1 %   MCV 98.4 80.0 - 100.0 fL   MCH 31.5 26.0 - 34.0 pg   MCHC 32.0 30.0 - 36.0 g/dL   RDW 24.5 80.9 - 98.3 %   Platelets 262 150 - 400 K/uL   nRBC 0.0 0.0 - 0.2 %   Neutrophils Relative % 78 %   Neutro Abs 11.4 (H) 1.7 - 7.7  K/uL   Lymphocytes Relative 13 %   Lymphs Abs 2.0 0.7 - 4.0 K/uL   Monocytes Relative 6 %   Monocytes Absolute 0.8 0.1 - 1.0 K/uL   Eosinophils Relative 2 %   Eosinophils Absolute 0.3 0.0 - 0.5 K/uL   Basophils Relative 0 %   Basophils Absolute 0.0 0.0 - 0.1 K/uL   Immature Granulocytes 1 %   Abs Immature Granulocytes 0.20 (H) 0.00 - 0.07 K/uL  Comprehensive metabolic panel     Status: Abnormal   Collection Time: 05/28/18  2:52 AM  Result Value Ref Range   Sodium 137 135 - 145 mmol/L   Potassium 3.6 3.5 - 5.1 mmol/L   Chloride 105 98 - 111 mmol/L   CO2 22 22 - 32 mmol/L   Glucose, Bld 112 (H) 70 - 99 mg/dL   BUN 7 6 - 20 mg/dL   Creatinine, Ser 0.45 0.44 - 1.00 mg/dL   Calcium 8.5 (L) 8.9 - 10.3 mg/dL   Total Protein 5.2 (L) 6.5 - 8.1 g/dL   Albumin 2.4 (L) 3.5 - 5.0 g/dL   AST 22 15 - 41 U/L   ALT 15 0 - 44 U/L   Alkaline Phosphatase 94 38 - 126 U/L   Total Bilirubin 0.6 0.3 - 1.2 mg/dL   GFR calc non Af Amer >60 >60 mL/min   GFR calc Af Amer >60 >60 mL/min   Anion gap 10 5 - 15  CBC     Status: Abnormal   Collection Time: 05/28/18  5:52 AM  Result Value Ref Range   WBC 14.6 (H) 4.0 - 10.5 K/uL   RBC 2.61 (L) 3.87 - 5.11 MIL/uL   Hemoglobin 8.5 (L) 12.0 - 15.0 g/dL   HCT 40.9 (L) 81.1 - 91.4 %   MCV 97.3 80.0 - 100.0 fL   MCH 32.6 26.0 - 34.0 pg   MCHC 33.5 30.0 - 36.0 g/dL   RDW 78.2 95.6 - 21.3 %   Platelets 257 150 - 400 K/uL   nRBC 0.0 0.0 - 0.2 %  Protime-INR     Status: None   Collection Time: 05/28/18  5:52 AM  Result Value Ref Range   Prothrombin Time 12.8 11.4 - 15.2 seconds   INR 1.0 0.8 - 1.2  APTT     Status: None   Collection Time: 05/28/18  5:52 AM   Result Value Ref Range   aPTT 31 24 - 36 seconds  Fibrinogen     Status: Abnormal   Collection Time: 05/28/18  5:52 AM  Result Value Ref Range   Fibrinogen 588 (H) 210 - 475 mg/dL  Save Smear     Status: None   Collection Time: 05/28/18  5:52 AM  Result Value Ref Range   Smear Review SMEAR STAINED AND AVAILABLE FOR REVIEW    Ct Abdomen Pelvis W Contrast  Result Date: 05/28/2018 CLINICAL DATA:  Acute generalized abdominal pain. Cesarean and tubal ligation 05/24/2017 EXAM: CT ABDOMEN AND PELVIS WITH CONTRAST TECHNIQUE: Multidetector CT imaging of the abdomen and pelvis was performed using the standard protocol following bolus administration of intravenous contrast. CONTRAST:  OMNIPAQUE IOHEXOL 300 MG/ML  SOLN COMPARISON:  02/15/2012 FINDINGS: Lower chest:  No contributory findings. Hepatobiliary: No focal liver abnormality.No evidence of biliary obstruction or stone. Pancreas: Unremarkable. Spleen: Unremarkable. Adrenals/Urinary Tract: Negative adrenals. No hydronephrosis or ureteral stone. 5 mm right upper pole renal calculus, incidental in this setting and present since at least 2013. Unremarkable bladder. Stomach/Bowel: No obstruction.  No evidence of bowel inflammation. Generalized stool retention Vascular/Lymphatic: No acute vascular abnormality. No mass or adenopathy. Reproductive:Recently gravid uterus. Gas in the endometrial cavity after recent cesarean section. There is a sizable hematoma in the lower rectus abdominus musculature and in the pre vesicular extraperitoneal space, 13 x 9 by 17 cm. Wispy contrast in the lower left rectus abdominus, likely inferior epigastric branch. Other: No ascites or pneumoperitoneum. Musculoskeletal: No acute abnormalities. Critical Value/emergent results were called by telephone at the time of interpretation on 05/28/2018 at 5:26 am to Midwife [facility] , who verbally acknowledged these results. IMPRESSION: 1. Large rectus sheath and prevesicular  hematoma (13 x 9 x 17 cm) with active hemorrhage in the lower left rectus sheath. 2. Right nephrolithiasis. Electronically Signed   By: Marnee Spring M.D.   On: 05/28/2018 05:27    MDM PE Labs: CBC w/Diff, CMP, UA Pain medication CT Scan Assessment and Plan  38 year old female S/P Repeat C/S and BTL Abdominal Pain  -Exam findings discussed -Start IV -Give Dilaudid 1mg  IV -Abdominal/Pelvic CT with contrast -CBC with Diff/CMP -Will await results  Follow Up (3:49 AM) -Nurse reports increased area if distention in patient's abdomen. -Provider in room to assess and area noted in original assessment, with no apparent changes. -Patient reports pain continues to be 10/10. -Nurse reports CT ready for patient. -Will send immediately.  Follow Up (4:36 AM) -Returns from CT-results pending -Will give Toradol 30mg  IV now for pain. -Discussed pain medication and awaiting of CT results. -Patient and husband without questions or concerns.   Follow Up (5:26 AM) -Dr. Odis Luster calls and reports results for CT Scan stating: *Large Bilateral Hematoma that goes into the peritoneal space *Active bleeding noted -Dr. Ernestina Penna contacted and informed of patient status, results, and advised: *Repeat CBC and will reassess after. *Requests Coagulation (DIC) Work Up -Lab notified and at bedside to complete at 0544  Follow Up (6:42 AM)  -CBC results return and decrease in Hgb noted -Fibrogen returns elevated -Dr. Ernestina Penna contacted, already aware of results, and is on site. -Care assumed  Cherre Robins MSN, CNM 05/28/2018, 2:15 AM

## 2018-05-28 NOTE — Progress Notes (Signed)
S:  Pt returned from IR. Pain is okay. She received Versed, Fentanyl for IR procedure. Laying flat in bed for 4 hrs post-procedure. Procedure was successful with identifying and embolizing active bleeding from branches of left inferior epigastric artery.   Objective: Vital signs in last 24 hours: Temp:  [97.4 F (36.3 C)-98.6 F (37 C)] 98.5 F (36.9 C) (03/26 1517) Pulse Rate:  [87-112] 104 (03/26 1517) Resp:  [15-25] 16 (03/26 1517) BP: (101-162)/(53-110) 123/73 (03/26 1517) SpO2:  [93 %-100 %] 100 % (03/26 1517) Weight change:  Last BM Date: 05/24/18 Physical exam:  A&O x 3, minimal distress if lays still. Pleasant HEENT neg, no thyromegaly Lungs CTA bilat CV mild tachycardia  Abdo distended with large abdominal wall hematoma Extr no edema/ tenderness   Intake/Output from previous day: No intake/output data recorded. Intake/Output this shift: Total I/O In: -  Out: 700 [Urine:700]   Lab Results: CBC Latest Ref Rng & Units 05/28/2018 05/28/2018 05/28/2018  WBC 4.0 - 10.5 K/uL 7.4 14.6(H) 14.7(H)  Hemoglobin 12.0 - 15.0 g/dL 7.9(L) 8.5(L) 9.7(L)  Hematocrit 36.0 - 46.0 % 24.2(L) 25.4(L) 30.3(L)  Platelets 150 - 400 K/uL 215 257 262   CMP Latest Ref Rng & Units 05/28/2018 05/28/2018  Glucose 70 - 99 mg/dL 119(H) 112(H)  BUN 6 - 20 mg/dL 7 7  Creatinine 0.44 - 1.00 mg/dL 0.52 0.56  Sodium 135 - 145 mmol/L 138 137  Potassium 3.5 - 5.1 mmol/L 3.7 3.6  Chloride 98 - 111 mmol/L 106 105  CO2 22 - 32 mmol/L 24 22  Calcium 8.9 - 10.3 mg/dL 8.2(L) 8.5(L)  Total Protein 6.5 - 8.1 g/dL 5.3(L) 5.2(L)  Total Bilirubin 0.3 - 1.2 mg/dL 0.5 0.6  Alkaline Phos 38 - 126 U/L 83 94  AST 15 - 41 U/L 19 22  ALT 0 - 44 U/L 14 15    Medications: Dilaudid for IV use as needed, then PO Percocet once able to eat.                         Continuing Zoloft, Buspar, Levothyroxine   Assessment/Plan: Readmission LOS 0 days, post-op RC/s and TL(salpingectomy) on 3/23, POD #3, was discharged on  3/25 AM.  Readmission for acute abdominal pain after feeling a "pop" when trying to get out of a recliner, with diagnosis if large subfascial hematoma 17x13x9 cm with active bleeding on CT, with anemia but hemodynamically stable Now s/p IR guided embolization of active bleeding from branches of left Inferior epigastric artery. Follow CBC closely, IV iron x1 dose, If stable defer blood transfusion  Ok to move after 4 hrs from IR procedure after groin pressure dressing removed. PO pain meds after able to eat and d/c foley after able to ambulate.  Resume home meds for anxiety/ depression, hypothyroidism, asthma/ Allergy  Pt counseled on what IR recommended - allow clot to dissolve with time, expect pain for some time / attempt drain after 5-6 days with risk of infection only if pain not better/ reexploration with possible risk of infection, poor would healing. Etc,  Will assess CBC in AM again and if remains stable consider D/c home tomorrow  Elveria Royals 05/28/2018, 4:52 PM   Studies/Results: Ct Abdomen Pelvis W Contrast  Result Date: 05/28/2018 CLINICAL DATA:  Acute generalized abdominal pain. Cesarean and tubal ligation 05/24/2017 EXAM: CT ABDOMEN AND PELVIS WITH CONTRAST TECHNIQUE: Multidetector CT imaging of the abdomen and pelvis was performed using the standard protocol following  bolus administration of intravenous contrast. CONTRAST:  169m OMNIPAQUE IOHEXOL 300 MG/ML  SOLN COMPARISON:  02/15/2012 FINDINGS: Lower chest:  No contributory findings. Hepatobiliary: No focal liver abnormality.No evidence of biliary obstruction or stone. Pancreas: Unremarkable. Spleen: Unremarkable. Adrenals/Urinary Tract: Negative adrenals. No hydronephrosis or ureteral stone. 5 mm right upper pole renal calculus, incidental in this setting and present since at least 2013. Unremarkable bladder. Stomach/Bowel: No obstruction. No evidence of bowel inflammation. Generalized stool retention Vascular/Lymphatic: No  acute vascular abnormality. No mass or adenopathy. Reproductive:Recently gravid uterus. Gas in the endometrial cavity after recent cesarean section. There is a sizable hematoma in the lower rectus abdominus musculature and in the pre vesicular extraperitoneal space, 13 x 9 by 17 cm. Wispy contrast in the lower left rectus abdominus, likely inferior epigastric branch. Other: No ascites or pneumoperitoneum. Musculoskeletal: No acute abnormalities. Critical Value/emergent results were called by telephone at the time of interpretation on 05/28/2018 at 5:26 am to MW[place], who verbally acknowledged these results. IMPRESSION: 1. Large rectus sheath and prevesicular hematoma (13 x 9 x 17 cm) with active hemorrhage in the lower left rectus sheath. 2. Right nephrolithiasis. Electronically Signed   By: JMonte FantasiaM.D.   On: 05/28/2018 05:27   Ir Angiogram Pelvis Selective Or Supraselective  Result Date: 05/28/2018 INDICATION: History of recent C-section, now with enlarging rectus sheath hematoma and active extravasation on preceding CT scan of the abdomen and pelvis. Please perform pelvic arteriogram potential embolization. EXAM: 1. ULTRASOUND GUIDANCE FOR ARTERIAL ACCESS. 2. FLUSH PELVIC ARTERIOGRAM 3. SELECTIVE LEFT INFERIOR EPIGASTRIC ARTERIOGRAM. 4. SUB SELECTIVE LEFT RECTUS MUSCULAR BRANCH OF THE LEFT INFERIOR EPIGASTRIC ARTERY AND PERCUTANEOUS COIL EMBOLIZATION X3 MEDICATIONS: None ANESTHESIA/SEDATION: Moderate (conscious) sedation was employed during this procedure. A total of Versed 2 mg and Fentanyl 75 mcg was administered intravenously. Moderate Sedation Time: 48 minutes. The patient's level of consciousness and vital signs were monitored continuously by radiology nursing throughout the procedure under my direct supervision. CONTRAST:  750mOMNIPAQUE IOHEXOL 300 MG/ML  SOLN FLUOROSCOPY TIME:  9 minutes, 54 seconds COMPLICATIONS: None immediate. PROCEDURE: Informed consent was obtained from the  patient following explanation of the procedure, risks, benefits and alternatives. The patient understands, agrees and consents for the procedure. All questions were addressed. A time out was performed prior to the initiation of the procedure. Maximal barrier sterile technique utilized including caps, mask, sterile gowns, sterile gloves, large sterile drape, hand hygiene, and Betadine prep. The right femoral head was marked fluoroscopically. Under sterile conditions and local anesthesia, the right common femoral artery access was performed with a micropuncture needle. Under direct ultrasound guidance, the right common femoral was accessed with a micropuncture kit. An ultrasound image was saved for documentation purposes. This allowed for placement of a 5-French vascular sheath. A limited arteriogram was performed through the side arm of the sheath confirming appropriate access within the right common femoral artery. Next, a Omni Flush catheter was advanced to the level of the aortic bifurcation where was back bled and flushed. Omni Flush catheter was then utilized to perform a flush pelvic arteriogram. With the use of a stiff Glidewire, the Omni Flush catheter was advanced into the contralateral left common femoral artery. Next, over a Bentson wire, the Omni Flush catheter was exchanged for a C2 catheter which was utilized to select the origin of the left inferior epigastric artery and a selective left inferior epigastric arteriogram was performed. With the use of a fathom 14 microwire, a regular Renegade microcatheter was utilized  to select a mid rectus muscular branch of the left inferior epigastric artery and a sub selective arteriogram was performed. Next, the vessel was percutaneous coil embolized with overlapping a total of three 2 mm diameter soft interlock coils. Next, the microcatheter was advanced to select a more superior rectus muscular branch of the left inferior epigastric artery and a selective  arteriogram was performed. The vessel with than percutaneously coil embolized with a 3 mm diameter soft interlock coil. Microcatheter was retracted inferiorly and utilized to select an inferior rectus muscular branch of the left inferior epigastric artery and a selective arteriogram was performed. The vessel was then percutaneously coil embolized with a 3 mm diameter soft interlock coil. The microcatheter was retracted into the more proximal aspect of the left inferior epigastric artery and a completion left inferior epigastric arteriogram was performed. The microcatheter and the C2 catheter were both removed and a right external iliac arteriogram was performed via the side arm of the access sheath in various obliquities allowing adequate visualization of the ipsilateral right inferior epigastric artery and its muscular branches. Images were reviewed and the procedure was terminated. All wires, catheters and sheaths were removed from the patient. Hemostasis was achieved at the right groin access site with deployment of an ExoSeal closure device and manual compression. A dressing was placed. The patient tolerated the procedure well and remained hemodynamically stable throughout the procedure. 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 Selective left inferior epigastric arteriogram demonstrates an ill-defined area of active extravasation predominantly supplied via a mid rectus muscular branch arising from the medial aspect of the left inferior epigastric artery. Sub selective arteriogram confirms this finding, and as such, this vessel was percutaneously coil embolized to near it's origin. Sub selective arteriograms of adjacent superior and inferior rectus muscular branches demonstrates potential collateral supply to the ill-defined area of active extravasation and as such both of these  vessels were also percutaneously coil embolized. Completion arteriogram demonstrates a technically excellent result with lack of any significant arterial supply to the expected location of the enlarging rectus sheath hematoma. Note, a portion of the embolization coil from the most inferior left rectus branch extends into the left inferior epigastric artery however does not resulting in a hemodynamically significant stenosis and is of doubtful clinical significance. Right common iliac arteriogram adequately demonstrates the origin of the right inferior epigastric artery as well as its muscular branches and is negative for a definitive area of contrast extravasation or vessel dissection. IMPRESSION: 1. Technically successful percutaneous coil embolization of three separate rectus muscular branches arising from the left inferior epigastric artery for active extravasation and enlarging/dissection rectus sheath hematoma. 2. No definitive areas of right-sided active extravasation or vessel irregularity with special attention paid to the right inferior epigastric artery and its muscular branches. PLAN: - The patient is to lie flat with right leg straight for 4 hours (until 2 p.m.). - Continued conservative management. Above discussed with Dr. Pamala Hurry at the time of procedure completion. Electronically Signed   By: Sandi Mariscal M.D.   On: 05/28/2018 11:24   Ir Angiogram Selective Each Additional Vessel  Result Date: 05/28/2018 INDICATION: History of recent C-section, now with enlarging rectus sheath hematoma and active extravasation on preceding CT scan of the abdomen and pelvis. Please perform pelvic arteriogram potential embolization. EXAM: 1. ULTRASOUND GUIDANCE FOR ARTERIAL ACCESS. 2. FLUSH PELVIC ARTERIOGRAM 3. SELECTIVE LEFT INFERIOR EPIGASTRIC ARTERIOGRAM. 4. SUB SELECTIVE  LEFT RECTUS MUSCULAR BRANCH OF THE LEFT INFERIOR EPIGASTRIC ARTERY AND PERCUTANEOUS COIL EMBOLIZATION X3 MEDICATIONS: None  ANESTHESIA/SEDATION: Moderate (conscious) sedation was employed during this procedure. A total of Versed 2 mg and Fentanyl 75 mcg was administered intravenously. Moderate Sedation Time: 48 minutes. The patient's level of consciousness and vital signs were monitored continuously by radiology nursing throughout the procedure under my direct supervision. CONTRAST:  44m OMNIPAQUE IOHEXOL 300 MG/ML  SOLN FLUOROSCOPY TIME:  9 minutes, 54 seconds COMPLICATIONS: None immediate. PROCEDURE: Informed consent was obtained from the patient following explanation of the procedure, risks, benefits and alternatives. The patient understands, agrees and consents for the procedure. All questions were addressed. A time out was performed prior to the initiation of the procedure. Maximal barrier sterile technique utilized including caps, mask, sterile gowns, sterile gloves, large sterile drape, hand hygiene, and Betadine prep. The right femoral head was marked fluoroscopically. Under sterile conditions and local anesthesia, the right common femoral artery access was performed with a micropuncture needle. Under direct ultrasound guidance, the right common femoral was accessed with a micropuncture kit. An ultrasound image was saved for documentation purposes. This allowed for placement of a 5-French vascular sheath. A limited arteriogram was performed through the side arm of the sheath confirming appropriate access within the right common femoral artery. Next, a Omni Flush catheter was advanced to the level of the aortic bifurcation where was back bled and flushed. Omni Flush catheter was then utilized to perform a flush pelvic arteriogram. With the use of a stiff Glidewire, the Omni Flush catheter was advanced into the contralateral left common femoral artery. Next, over a Bentson wire, the Omni Flush catheter was exchanged for a C2 catheter which was utilized to select the origin of the left inferior epigastric artery and a selective  left inferior epigastric arteriogram was performed. With the use of a fathom 14 microwire, a regular Renegade microcatheter was utilized to select a mid rectus muscular branch of the left inferior epigastric artery and a sub selective arteriogram was performed. Next, the vessel was percutaneous coil embolized with overlapping a total of three 2 mm diameter soft interlock coils. Next, the microcatheter was advanced to select a more superior rectus muscular branch of the left inferior epigastric artery and a selective arteriogram was performed. The vessel with than percutaneously coil embolized with a 3 mm diameter soft interlock coil. Microcatheter was retracted inferiorly and utilized to select an inferior rectus muscular branch of the left inferior epigastric artery and a selective arteriogram was performed. The vessel was then percutaneously coil embolized with a 3 mm diameter soft interlock coil. The microcatheter was retracted into the more proximal aspect of the left inferior epigastric artery and a completion left inferior epigastric arteriogram was performed. The microcatheter and the C2 catheter were both removed and a right external iliac arteriogram was performed via the side arm of the access sheath in various obliquities allowing adequate visualization of the ipsilateral right inferior epigastric artery and its muscular branches. Images were reviewed and the procedure was terminated. All wires, catheters and sheaths were removed from the patient. Hemostasis was achieved at the right groin access site with deployment of an ExoSeal closure device and manual compression. A dressing was placed. The patient tolerated the procedure well and remained hemodynamically stable throughout the procedure. 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 Selective left inferior epigastric  arteriogram demonstrates an ill-defined area of active  extravasation predominantly supplied via a mid rectus muscular branch arising from the medial aspect of the left inferior epigastric artery. Sub selective arteriogram confirms this finding, and as such, this vessel was percutaneously coil embolized to near it's origin. Sub selective arteriograms of adjacent superior and inferior rectus muscular branches demonstrates potential collateral supply to the ill-defined area of active extravasation and as such both of these vessels were also percutaneously coil embolized. Completion arteriogram demonstrates a technically excellent result with lack of any significant arterial supply to the expected location of the enlarging rectus sheath hematoma. Note, a portion of the embolization coil from the most inferior left rectus branch extends into the left inferior epigastric artery however does not resulting in a hemodynamically significant stenosis and is of doubtful clinical significance. Right common iliac arteriogram adequately demonstrates the origin of the right inferior epigastric artery as well as its muscular branches and is negative for a definitive area of contrast extravasation or vessel dissection. IMPRESSION: 1. Technically successful percutaneous coil embolization of three separate rectus muscular branches arising from the left inferior epigastric artery for active extravasation and enlarging/dissection rectus sheath hematoma. 2. No definitive areas of right-sided active extravasation or vessel irregularity with special attention paid to the right inferior epigastric artery and its muscular branches. PLAN: - The patient is to lie flat with right leg straight for 4 hours (until 2 p.m.). - Continued conservative management. Above discussed with Dr. Pamala Hurry at the time of procedure completion. Electronically Signed   By: Sandi Mariscal M.D.   On: 05/28/2018 11:24   Ir Angiogram Selective Each Additional  Vessel  Result Date: 05/28/2018 INDICATION: History of recent C-section, now with enlarging rectus sheath hematoma and active extravasation on preceding CT scan of the abdomen and pelvis. Please perform pelvic arteriogram potential embolization. EXAM: 1. ULTRASOUND GUIDANCE FOR ARTERIAL ACCESS. 2. FLUSH PELVIC ARTERIOGRAM 3. SELECTIVE LEFT INFERIOR EPIGASTRIC ARTERIOGRAM. 4. SUB SELECTIVE LEFT RECTUS MUSCULAR BRANCH OF THE LEFT INFERIOR EPIGASTRIC ARTERY AND PERCUTANEOUS COIL EMBOLIZATION X3 MEDICATIONS: None ANESTHESIA/SEDATION: Moderate (conscious) sedation was employed during this procedure. A total of Versed 2 mg and Fentanyl 75 mcg was administered intravenously. Moderate Sedation Time: 48 minutes. The patient's level of consciousness and vital signs were monitored continuously by radiology nursing throughout the procedure under my direct supervision. CONTRAST:  71m OMNIPAQUE IOHEXOL 300 MG/ML  SOLN FLUOROSCOPY TIME:  9 minutes, 54 seconds COMPLICATIONS: None immediate. PROCEDURE: Informed consent was obtained from the patient following explanation of the procedure, risks, benefits and alternatives. The patient understands, agrees and consents for the procedure. All questions were addressed. A time out was performed prior to the initiation of the procedure. Maximal barrier sterile technique utilized including caps, mask, sterile gowns, sterile gloves, large sterile drape, hand hygiene, and Betadine prep. The right femoral head was marked fluoroscopically. Under sterile conditions and local anesthesia, the right common femoral artery access was performed with a micropuncture needle. Under direct ultrasound guidance, the right common femoral was accessed with a micropuncture kit. An ultrasound image was saved for documentation purposes. This allowed for placement of a 5-French vascular sheath. A limited arteriogram was performed through the side arm of the sheath confirming appropriate access within the right  common femoral artery. Next, a Omni Flush catheter was advanced to the level of the aortic bifurcation where was back bled and flushed. Omni Flush catheter was then utilized to perform a flush pelvic arteriogram. With the use of a stiff Glidewire, the Omni Flush catheter was advanced into the contralateral  left common femoral artery. Next, over a Bentson wire, the Omni Flush catheter was exchanged for a C2 catheter which was utilized to select the origin of the left inferior epigastric artery and a selective left inferior epigastric arteriogram was performed. With the use of a fathom 14 microwire, a regular Renegade microcatheter was utilized to select a mid rectus muscular branch of the left inferior epigastric artery and a sub selective arteriogram was performed. Next, the vessel was percutaneous coil embolized with overlapping a total of three 2 mm diameter soft interlock coils. Next, the microcatheter was advanced to select a more superior rectus muscular branch of the left inferior epigastric artery and a selective arteriogram was performed. The vessel with than percutaneously coil embolized with a 3 mm diameter soft interlock coil. Microcatheter was retracted inferiorly and utilized to select an inferior rectus muscular branch of the left inferior epigastric artery and a selective arteriogram was performed. The vessel was then percutaneously coil embolized with a 3 mm diameter soft interlock coil. The microcatheter was retracted into the more proximal aspect of the left inferior epigastric artery and a completion left inferior epigastric arteriogram was performed. The microcatheter and the C2 catheter were both removed and a right external iliac arteriogram was performed via the side arm of the access sheath in various obliquities allowing adequate visualization of the ipsilateral right inferior epigastric artery and its muscular branches. Images were reviewed and the procedure was terminated. All wires,  catheters and sheaths were removed from the patient. Hemostasis was achieved at the right groin access site with deployment of an ExoSeal closure device and manual compression. A dressing was placed. The patient tolerated the procedure well and remained hemodynamically stable throughout the procedure. 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 Selective left inferior epigastric arteriogram demonstrates an ill-defined area of active extravasation predominantly supplied via a mid rectus muscular branch arising from the medial aspect of the left inferior epigastric artery. Sub selective arteriogram confirms this finding, and as such, this vessel was percutaneously coil embolized to near it's origin. Sub selective arteriograms of adjacent superior and inferior rectus muscular branches demonstrates potential collateral supply to the ill-defined area of active extravasation and as such both of these vessels were also percutaneously coil embolized. Completion arteriogram demonstrates a technically excellent result with lack of any significant arterial supply to the expected location of the enlarging rectus sheath hematoma. Note, a portion of the embolization coil from the most inferior left rectus branch extends into the left inferior epigastric artery however does not resulting in a hemodynamically significant stenosis and is of doubtful clinical significance. Right common iliac arteriogram adequately demonstrates the origin of the right inferior epigastric artery as well as its muscular branches and is negative for a definitive area of contrast extravasation or vessel dissection. IMPRESSION: 1. Technically successful percutaneous coil embolization of three separate rectus muscular branches arising from the left inferior epigastric artery for active extravasation and enlarging/dissection  rectus sheath hematoma. 2. No definitive areas of right-sided active extravasation or vessel irregularity with special attention paid to the right inferior epigastric artery and its muscular branches. PLAN: - The patient is to lie flat with right leg straight for 4 hours (until 2 p.m.). - Continued conservative management. Above discussed with Dr. Pamala Hurry at the time of procedure completion. Electronically Signed   By: Sandi Mariscal M.D.   On: 05/28/2018 11:24   Ir Angiogram Selective Each  Additional Vessel  Result Date: 05/28/2018 INDICATION: History of recent C-section, now with enlarging rectus sheath hematoma and active extravasation on preceding CT scan of the abdomen and pelvis. Please perform pelvic arteriogram potential embolization. EXAM: 1. ULTRASOUND GUIDANCE FOR ARTERIAL ACCESS. 2. FLUSH PELVIC ARTERIOGRAM 3. SELECTIVE LEFT INFERIOR EPIGASTRIC ARTERIOGRAM. 4. SUB SELECTIVE LEFT RECTUS MUSCULAR BRANCH OF THE LEFT INFERIOR EPIGASTRIC ARTERY AND PERCUTANEOUS COIL EMBOLIZATION X3 MEDICATIONS: None ANESTHESIA/SEDATION: Moderate (conscious) sedation was employed during this procedure. A total of Versed 2 mg and Fentanyl 75 mcg was administered intravenously. Moderate Sedation Time: 48 minutes. The patient's level of consciousness and vital signs were monitored continuously by radiology nursing throughout the procedure under my direct supervision. CONTRAST:  82m OMNIPAQUE IOHEXOL 300 MG/ML  SOLN FLUOROSCOPY TIME:  9 minutes, 54 seconds COMPLICATIONS: None immediate. PROCEDURE: Informed consent was obtained from the patient following explanation of the procedure, risks, benefits and alternatives. The patient understands, agrees and consents for the procedure. All questions were addressed. A time out was performed prior to the initiation of the procedure. Maximal barrier sterile technique utilized including caps, mask, sterile gowns, sterile gloves, large sterile drape, hand hygiene, and Betadine prep. The  right femoral head was marked fluoroscopically. Under sterile conditions and local anesthesia, the right common femoral artery access was performed with a micropuncture needle. Under direct ultrasound guidance, the right common femoral was accessed with a micropuncture kit. An ultrasound image was saved for documentation purposes. This allowed for placement of a 5-French vascular sheath. A limited arteriogram was performed through the side arm of the sheath confirming appropriate access within the right common femoral artery. Next, a Omni Flush catheter was advanced to the level of the aortic bifurcation where was back bled and flushed. Omni Flush catheter was then utilized to perform a flush pelvic arteriogram. With the use of a stiff Glidewire, the Omni Flush catheter was advanced into the contralateral left common femoral artery. Next, over a Bentson wire, the Omni Flush catheter was exchanged for a C2 catheter which was utilized to select the origin of the left inferior epigastric artery and a selective left inferior epigastric arteriogram was performed. With the use of a fathom 14 microwire, a regular Renegade microcatheter was utilized to select a mid rectus muscular branch of the left inferior epigastric artery and a sub selective arteriogram was performed. Next, the vessel was percutaneous coil embolized with overlapping a total of three 2 mm diameter soft interlock coils. Next, the microcatheter was advanced to select a more superior rectus muscular branch of the left inferior epigastric artery and a selective arteriogram was performed. The vessel with than percutaneously coil embolized with a 3 mm diameter soft interlock coil. Microcatheter was retracted inferiorly and utilized to select an inferior rectus muscular branch of the left inferior epigastric artery and a selective arteriogram was performed. The vessel was then percutaneously coil embolized with a 3 mm diameter soft interlock coil. The  microcatheter was retracted into the more proximal aspect of the left inferior epigastric artery and a completion left inferior epigastric arteriogram was performed. The microcatheter and the C2 catheter were both removed and a right external iliac arteriogram was performed via the side arm of the access sheath in various obliquities allowing adequate visualization of the ipsilateral right inferior epigastric artery and its muscular branches. Images were reviewed and the procedure was terminated. All wires, catheters and sheaths were removed from the patient. Hemostasis was achieved at the right groin access site with deployment of an ExoSeal  closure device and manual compression. A dressing was placed. The patient tolerated the procedure well and remained hemodynamically stable throughout the procedure. 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 Selective left inferior epigastric arteriogram demonstrates an ill-defined area of active extravasation predominantly supplied via a mid rectus muscular branch arising from the medial aspect of the left inferior epigastric artery. Sub selective arteriogram confirms this finding, and as such, this vessel was percutaneously coil embolized to near it's origin. Sub selective arteriograms of adjacent superior and inferior rectus muscular branches demonstrates potential collateral supply to the ill-defined area of active extravasation and as such both of these vessels were also percutaneously coil embolized. Completion arteriogram demonstrates a technically excellent result with lack of any significant arterial supply to the expected location of the enlarging rectus sheath hematoma. Note, a portion of the embolization coil from the most inferior left rectus branch extends into the left inferior epigastric artery however does not resulting in a  hemodynamically significant stenosis and is of doubtful clinical significance. Right common iliac arteriogram adequately demonstrates the origin of the right inferior epigastric artery as well as its muscular branches and is negative for a definitive area of contrast extravasation or vessel dissection. IMPRESSION: 1. Technically successful percutaneous coil embolization of three separate rectus muscular branches arising from the left inferior epigastric artery for active extravasation and enlarging/dissection rectus sheath hematoma. 2. No definitive areas of right-sided active extravasation or vessel irregularity with special attention paid to the right inferior epigastric artery and its muscular branches. PLAN: - The patient is to lie flat with right leg straight for 4 hours (until 2 p.m.). - Continued conservative management. Above discussed with Dr. Pamala Hurry at the time of procedure completion. Electronically Signed   By: Sandi Mariscal M.D.   On: 05/28/2018 11:24   Ir US Guide Vasc Access Right  Result Date: 05/28/2018 INDICATION: History of recent C-section, now with enlarging rectus sheath hematoma and active extravasation on preceding CT scan of the abdomen and pelvis. Please perform pelvic arteriogram potential embolization. EXAM: 1. ULTRASOUND GUIDANCE FOR ARTERIAL ACCESS. 2. FLUSH PELVIC ARTERIOGRAM 3. SELECTIVE LEFT INFERIOR EPIGASTRIC ARTERIOGRAM. 4. SUB SELECTIVE LEFT RECTUS MUSCULAR BRANCH OF THE LEFT INFERIOR EPIGASTRIC ARTERY AND PERCUTANEOUS COIL EMBOLIZATION X3 MEDICATIONS: None ANESTHESIA/SEDATION: Moderate (conscious) sedation was employed during this procedure. A total of Versed 2 mg and Fentanyl 75 mcg was administered intravenously. Moderate Sedation Time: 48 minutes. The patient's level of consciousness and vital signs were monitored continuously by radiology nursing throughout the procedure under my direct supervision. CONTRAST:  75m OMNIPAQUE IOHEXOL 300 MG/ML  SOLN FLUOROSCOPY TIME:  9  minutes, 54 seconds COMPLICATIONS: None immediate. PROCEDURE: Informed consent was obtained from the patient following explanation of the procedure, risks, benefits and alternatives. The patient understands, agrees and consents for the procedure. All questions were addressed. A time out was performed prior to the initiation of the procedure. Maximal barrier sterile technique utilized including caps, mask, sterile gowns, sterile gloves, large sterile drape, hand hygiene, and Betadine prep. The right femoral head was marked fluoroscopically. Under sterile conditions and local anesthesia, the right common femoral artery access was performed with a micropuncture needle. Under direct ultrasound guidance, the right common femoral was accessed with a micropuncture kit. An ultrasound image was saved for documentation purposes. This allowed for placement of a 5-French vascular sheath. A limited arteriogram was performed through the side arm of the sheath confirming appropriate access  within the right common femoral artery. Next, a Omni Flush catheter was advanced to the level of the aortic bifurcation where was back bled and flushed. Omni Flush catheter was then utilized to perform a flush pelvic arteriogram. With the use of a stiff Glidewire, the Omni Flush catheter was advanced into the contralateral left common femoral artery. Next, over a Bentson wire, the Omni Flush catheter was exchanged for a C2 catheter which was utilized to select the origin of the left inferior epigastric artery and a selective left inferior epigastric arteriogram was performed. With the use of a fathom 14 microwire, a regular Renegade microcatheter was utilized to select a mid rectus muscular branch of the left inferior epigastric artery and a sub selective arteriogram was performed. Next, the vessel was percutaneous coil embolized with overlapping a total of three 2 mm diameter soft interlock coils. Next, the microcatheter was advanced to select  a more superior rectus muscular branch of the left inferior epigastric artery and a selective arteriogram was performed. The vessel with than percutaneously coil embolized with a 3 mm diameter soft interlock coil. Microcatheter was retracted inferiorly and utilized to select an inferior rectus muscular branch of the left inferior epigastric artery and a selective arteriogram was performed. The vessel was then percutaneously coil embolized with a 3 mm diameter soft interlock coil. The microcatheter was retracted into the more proximal aspect of the left inferior epigastric artery and a completion left inferior epigastric arteriogram was performed. The microcatheter and the C2 catheter were both removed and a right external iliac arteriogram was performed via the side arm of the access sheath in various obliquities allowing adequate visualization of the ipsilateral right inferior epigastric artery and its muscular branches. Images were reviewed and the procedure was terminated. All wires, catheters and sheaths were removed from the patient. Hemostasis was achieved at the right groin access site with deployment of an ExoSeal closure device and manual compression. A dressing was placed. The patient tolerated the procedure well and remained hemodynamically stable throughout the procedure. 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 Selective left inferior epigastric arteriogram demonstrates an ill-defined area of active extravasation predominantly supplied via a mid rectus muscular branch arising from the medial aspect of the left inferior epigastric artery. Sub selective arteriogram confirms this finding, and as such, this vessel was percutaneously coil embolized to near it's origin. Sub selective arteriograms of adjacent superior and inferior rectus muscular branches demonstrates potential  collateral supply to the ill-defined area of active extravasation and as such both of these vessels were also percutaneously coil embolized. Completion arteriogram demonstrates a technically excellent result with lack of any significant arterial supply to the expected location of the enlarging rectus sheath hematoma. Note, a portion of the embolization coil from the most inferior left rectus branch extends into the left inferior epigastric artery however does not resulting in a hemodynamically significant stenosis and is of doubtful clinical significance. Right common iliac arteriogram adequately demonstrates the origin of the right inferior epigastric artery as well as its muscular branches and is negative for a definitive area of contrast extravasation or vessel dissection. IMPRESSION: 1. Technically successful percutaneous coil embolization of three separate rectus muscular branches arising from the left inferior epigastric artery for active extravasation and enlarging/dissection rectus sheath hematoma. 2. No definitive areas of right-sided active extravasation or vessel irregularity with special attention paid to the right inferior epigastric artery and its muscular  branches. PLAN: - The patient is to lie flat with right leg straight for 4 hours (until 2 p.m.). - Continued conservative management. Above discussed with Dr. Pamala Hurry at the time of procedure completion. Electronically Signed   By: Sandi Mariscal M.D.   On: 05/28/2018 11:24   [place] Guide Roadmapping  Result Date: 05/28/2018 INDICATION: History of recent C-section, now with enlarging rectus sheath hematoma and active extravasation on preceding CT scan of the abdomen and pelvis. Please perform pelvic arteriogram potential embolization. EXAM: 1. ULTRASOUND GUIDANCE FOR ARTERIAL ACCESS. 2. FLUSH PELVIC ARTERIOGRAM 3. SELECTIVE LEFT INFERIOR EPIGASTRIC ARTERIOGRAM. 4. SUB SELECTIVE LEFT RECTUS MUSCULAR BRANCH OF THE  LEFT INFERIOR EPIGASTRIC ARTERY AND PERCUTANEOUS COIL EMBOLIZATION X3 MEDICATIONS: None ANESTHESIA/SEDATION: Moderate (conscious) sedation was employed during this procedure. A total of Versed 2 mg and Fentanyl 75 mcg was administered intravenously. Moderate Sedation Time: 48 minutes. The patient's level of consciousness and vital signs were monitored continuously by radiology nursing throughout the procedure under my direct supervision. CONTRAST:  38m OMNIPAQUE IOHEXOL 300 MG/ML  SOLN FLUOROSCOPY TIME:  9 minutes, 54 seconds COMPLICATIONS: None immediate. PROCEDURE: Informed consent was obtained from the patient following explanation of the procedure, risks, benefits and alternatives. The patient understands, agrees and consents for the procedure. All questions were addressed. A time out was performed prior to the initiation of the procedure. Maximal barrier sterile technique utilized including caps, mask, sterile gowns, sterile gloves, large sterile drape, hand hygiene, and Betadine prep. The right femoral head was marked fluoroscopically. Under sterile conditions and local anesthesia, the right common femoral artery access was performed with a micropuncture needle. Under direct ultrasound guidance, the right common femoral was accessed with a micropuncture kit. An ultrasound image was saved for documentation purposes. This allowed for placement of a 5-French vascular sheath. A limited arteriogram was performed through the side arm of the sheath confirming appropriate access within the right common femoral artery. Next, a Omni Flush catheter was advanced to the level of the aortic bifurcation where was back bled and flushed. Omni Flush catheter was then utilized to perform a flush pelvic arteriogram. With the use of a stiff Glidewire, the Omni Flush catheter was advanced into the contralateral left common femoral artery. Next, over a Bentson wire, the Omni Flush catheter was exchanged for a C2 catheter which was  utilized to select the origin of the left inferior epigastric artery and a selective left inferior epigastric arteriogram was performed. With the use of a fathom 14 microwire, a regular Renegade microcatheter was utilized to select a mid rectus muscular branch of the left inferior epigastric artery and a sub selective arteriogram was performed. Next, the vessel was percutaneous coil embolized with overlapping a total of three 2 mm diameter soft interlock coils. Next, the microcatheter was advanced to select a more superior rectus muscular branch of the left inferior epigastric artery and a selective arteriogram was performed. The vessel with than percutaneously coil embolized with a 3 mm diameter soft interlock coil. Microcatheter was retracted inferiorly and utilized to select an inferior rectus muscular branch of the left inferior epigastric artery and a selective arteriogram was performed. The vessel was then percutaneously coil embolized with a 3 mm diameter soft interlock coil. The microcatheter was retracted into the more proximal aspect of the left inferior epigastric artery and a completion left inferior epigastric arteriogram was performed. The microcatheter and the C2 catheter were both removed and a right external iliac  arteriogram was performed via the side arm of the access sheath in various obliquities allowing adequate visualization of the ipsilateral right inferior epigastric artery and its muscular branches. Images were reviewed and the procedure was terminated. All wires, catheters and sheaths were removed from the patient. Hemostasis was achieved at the right groin access site with deployment of an ExoSeal closure device and manual compression. A dressing was placed. The patient tolerated the procedure well and remained hemodynamically stable throughout the procedure. FINDINGS: Flush pelvic arteriogram demonstrates conventional branching pattern with expected hypertrophy of the bilateral uterine  arteries given patient's post gravid state. Selective left inferior epigastric arteriogram demonstrates an ill-defined area of active extravasation predominantly supplied via a mid rectus muscular branch arising from the medial aspect of the left inferior epigastric artery. Sub selective arteriogram confirms this finding, and as such, this vessel was percutaneously coil embolized to near it's origin. Sub selective arteriograms of adjacent superior and inferior rectus muscular branches demonstrates potential collateral supply to the ill-defined area of active extravasation and as such both of these vessels were also percutaneously coil embolized. Completion arteriogram demonstrates a technically excellent result with lack of any significant arterial supply to the expected location of the enlarging rectus sheath hematoma. Note, a portion of the embolization coil from the most inferior left rectus branch extends into the left inferior epigastric artery however does not resulting in a hemodynamically significant stenosis and is of doubtful clinical significance. Right common iliac arteriogram adequately demonstrates the origin of the right inferior epigastric artery as well as its muscular branches and is negative for a definitive area of contrast extravasation or vessel dissection. IMPRESSION: 1. Technically successful percutaneous coil embolization of three separate rectus muscular branches arising from the left inferior epigastric artery for active extravasation and enlarging/dissection rectus sheath hematoma. 2. No definitive areas of right-sided active extravasation or vessel irregularity with special attention paid to the right inferior epigastric artery and its muscular branches. PLAN: - The patient is to lie flat with right leg straight for 4 hours (until 2 p.m.). - Continued conservative management. Above discussed with Dr. Pamala Hurry at the time of procedure completion. Electronically Signed   By: Sandi Mariscal  M.D.   On: 05/28/2018 11:24   Patient ID: Megan Lara, female   DOB: 1981-02-11, 38 y.o.   MRN: [id number]

## 2018-05-28 NOTE — Lactation Note (Signed)
Lactation Consultation Note  Patient Name: Megan Lara [id number] Date: 05/28/2018 Reason for consult: Follow-up assessment   Readmit for hematoma..  Mother was discharged yesterday and was breastfeeding 3 day old infant. Baby is not with mother at this time but FOB states he will be bring baby to the hospital today.  Mother states she had some cracks/bleeding with her nipples prior to original discharge. She states cracks/ bleeding became more prominent while using hand pump in [facility]. When lactation entered room mother was pumping with DEBP using #27 size flanges. Noted bilateral cracks/bleeding and exudate on nipples. Refitted mother to #30 flange which mother stated felt more comfortable. She had a procedure earlier today and only pumped with manual pump x 1 today. Mother had questions regarding safety of medication during procedure. Per Dwain Sarna Medications and Mother's Milk -Versed L2  and Fentanyl L2 one time dose is safe for breastfeeding mothers.   Recommend mother pump again with DEBP in 2 hours and then q 3 hours. Provided mother with comfort gels and suggest talking to her OB/GYN regarding APNO. Will be sure that lactation to see mother latch tonight. First child had tongue tie but parents state that is not the case with this child.     Maternal Data    Feeding    LATCH Score                   Interventions Interventions: DEBP;Comfort gels;Coconut oil  Lactation Tools Discussed/Used Pump Review: Setup, frequency, and cleaning;Milk Storage Initiated by:: Donzetta Sprung RN Date initiated:: 05/28/18   Consult Status Consult Status: Follow-up Date: 05/28/18 Follow-up type: In-patient    Dahlia Byes [facility] 05/28/2018, 3:21 PM

## 2018-05-29 ENCOUNTER — Encounter (HOSPITAL_COMMUNITY): Payer: Self-pay | Admitting: Obstetrics & Gynecology

## 2018-05-29 LAB — CBC
HCT: 22.4 % — ABNORMAL LOW (ref 36.0–46.0)
Hemoglobin: 7.3 g/dL — ABNORMAL LOW (ref 12.0–15.0)
MCH: 32.4 pg (ref 26.0–34.0)
MCHC: 32.6 g/dL (ref 30.0–36.0)
MCV: 99.6 fL (ref 80.0–100.0)
PLATELETS: 241 10*3/uL (ref 150–400)
RBC: 2.25 MIL/uL — ABNORMAL LOW (ref 3.87–5.11)
RDW: 13.3 % (ref 11.5–15.5)
WBC: 8.2 10*3/uL (ref 4.0–10.5)
nRBC: 0 % (ref 0.0–0.2)

## 2018-05-29 MED ORDER — OXYCODONE HCL 5 MG PO TABS
5.0000 mg | ORAL_TABLET | ORAL | Status: DC | PRN
  Administered 2018-05-29: 5 mg via ORAL
  Filled 2018-05-29: qty 1

## 2018-05-29 MED ORDER — IBUPROFEN 600 MG PO TABS
600.0000 mg | ORAL_TABLET | Freq: Four times a day (QID) | ORAL | Status: DC | PRN
  Administered 2018-05-29: 600 mg via ORAL
  Filled 2018-05-29: qty 1

## 2018-05-29 NOTE — Lactation Note (Addendum)
Lactation Consultant Note: Patient Name: Megan Lara [id number] Date: 05/29/2018 Reason for consult: Follow up assessment. Readmit for hematoma and mom exclusively breastfeeding 4 day old female infant. LC observed trauma on both breast mom's  nipple tissue had nipple stripe on left breast, redness with abrasions.  Mom has been using hand pump with wrong flange size and was fitted with 30 mm breast flange. Mom is using DEBP while in [facility] and latching infant to breast. Mom latched infant on left breast using football hold, LC asked mom to wait until infant mouth is wide with nose to breast, audible swallowing heard and observed. Per mom, she felt a tug with latch but not pain, and felt infant had deeper latch. Latch score of 8 Infant was still breastfeeding after LC left room had been breastfeeding for 15 minutes. See flow sheet for latch score.  Mom has small lumps under armpit  LC ask mom to discussed it with her physician. Mom breast are becoming much fuller.   Mom is currently pumping 60 ml with DEBP. Mom breast are becoming much fuller and LC discussed frequent feeding nursing every 2 to 3 hours wake baby as need, breastfeed with cues. Per mom, she has been wearing comfort gels and applies comfort gels to help alleviate breast soreness and trauma. Mom has been given comfort gels and coconut oil by other LC yesterday.  Reviewed engorgement prevention and treatment.  Mom will cal [facility] services, if she has any more questions, concerns or need assistance with latching infant to breast.

## 2018-05-29 NOTE — Progress Notes (Signed)
Patient ID: Megan Lara, female   DOB: [birth year], 38 y.o.   MRN: [id number] Subjective: Percocet gave HA, PO Dilaudid didn't help, IV Dilaudid helped. Pt took oxycodone IR after c-section and didn't get HA from it. Will try that and assess if pain control better, also adding Motrin since active bleeding controlled by IR  Objective: BP 114/68 (BP Location: Left Arm)   Pulse (!) 113   Temp 98.8 F (37.1 C) (Oral)   Resp 16   Ht 5\' 4"  (1.626 m)   SpO2 96%   BMI 29.52 kg/m   Vital signs in last 24 hours: Temp:  [97.4 F (36.3 C)-99.5 F (37.5 C)] 98.8 F (37.1 C) (03/27 1235) Pulse Rate:  [87-114] 113 (03/27 1235) Resp:  [15-25] 16 (03/27 1235) BP: (105-162)/(62-110) 114/68 (03/27 1235) SpO2:  [96 %-100 %] 96 % (03/27 1235) Weight change:  Last BM Date: 05/24/18  Intake/Output from previous day: 03/26 0701 - 03/27 0700 In: 480 [P.O.:480] Out: 3150 [Urine:3150] Intake/Output this shift: Total I/O In: 120 [P.O.:120] Out: 975 [Urine:975]  General appearance: alert and cooperative Resp: clear to auscultation bilaterally Breasts: normal appearance, no masses or tenderness Cardio: regular rate and rhythm GI: soft, non-tender; bowel sounds normal; no masses,  no organomegaly Skin: Skin color, texture, turgor normal. No rashes or lesions LNs- bilateral single axillary LN in each axilla   Lab Results: CBC Latest Ref Rng & Units 05/29/2018 05/28/2018 05/28/2018  WBC 4.0 - 10.5 K/uL 8.2 7.4 14.6(H)  Hemoglobin 12.0 - 15.0 g/dL 7.3(L) 7.9(L) 8.5(L)  Hematocrit 36.0 - 46.0 % 22.4(L) 24.2(L) 25.4(L)  Platelets 150 - 400 K/uL 241 215 257   BMET Recent Labs    05/28/18 0252 05/28/18 0729  NA 137 138  K 3.6 3.7  CL 105 106  CO2 22 24  GLUCOSE 112* 119*  BUN 7 7  CREATININE 0.56 0.52  CALCIUM 8.5* 8.2*    Assessment/Plan: HD#2, Rectus sheath hematoma from left inferior epigastric artery bleed on POD # 2 night, readmission CT reports 17x13x9 cm abdominal wall  hematoma under rectus sheath.  S/p IR guided Lt inferior epigastric artery branches blockage with coil. Groin site appears wnl, no hematoma. F/up CT in 5-6 days to assess if clot liquifed and if she is a candidate for IR guided drainage.  C-section incision closed, will remove protective dressing Pain control- percocet not tolerated (headache), switch to Oxycodone IR and Motrin and assess if pain controlled well before discharge  ABL- s/p IV iron 3/26, will take PO iron after bowel function return to nl   Hypothyroid- Synthroid Anxiety/Depression- Buspar, Zoloft   Postpartum- breast feeding, lactation consult, nipples pain, chafing- All purpose nipple cream ordered at [facility] compounding pharmacy Breasts- bilateral engorgement, bilateral axillary single LNs noted. Breasts are normal, nipple cracked.  PLAN- if pain controlled well with oral agents, okay to discharge tonight or tomorrow as needed            Office RN to f/up with pt re-pain management and plan repeat CT in 5-6 days to assess if she is candidate for IR guided drainage of hematoma   Warning s/s reviewed incl fever/ chills/ worse pain/ n/v / wound drainage    Robley Fries 05/29/2018, 1:52 PM

## 2018-05-29 NOTE — Lactation Note (Signed)
Lactation Consultation Note  Patient Name: Megan Lara RXYVO'P Date: 05/29/2018  Baby girl Herschberger breastfeeding on arrival.  Infant positioned well and mom reports comfort.  Mom reports breast softening during the feeding. Mom reports lumps under each arm.  Urged mom to discuss with OB.  Urged mom to feel prior to breastfeeding and if they softened during the feed the tissue was connected and she could massage during feed.Urged mom to also try some ice on them if they were extremely uncomfortable. Mom reports nipples are feeling better.  Mom showed left nipple.  Minimal damage now on left but redness remained.  Urged mom to hand express prior to breastfeeding and after breastfeeding and rub expressed mothers milk on nipples and air dry.Moms hgb has dropped to 7.3 gms/dl today.  Discussed staying hydrated and possibly doing some pumping with DEBP if mom feeling up to past breastfeeding(post pumping).  Infant falling asleep at breast.  Showed mom how to do some massage and hand expression while feeding to help soften breast and get baby more milk and keep her awake.  Urged mom to call lactation as needed.   Maternal Data    Feeding    LATCH Score                   Interventions    Lactation Tools Discussed/Used     Consult Status       Megan Lara 05/29/2018, 9:48 AM

## 2018-05-29 NOTE — Progress Notes (Addendum)
Patient ID: Megan Lara, female   DOB: Apr 08, 1980, 38 y.o.   MRN: [id number]   IR round note via phone  -- new regulations  3/26 procedure:  pelvic arteriogram and percutaneous coil embolization of 3 separate muscular branches of the medial apsect of the left inferior epigastric artery.    Pt resting well per RN abd pain sl better today Stable hg No transfusions  Groin site clen and dry NT no bleeding No hematoma er RN  2+ pulses Rt foot  Call if need [place]

## 2018-05-29 NOTE — Progress Notes (Signed)
Patient ID: Megan Lara, female   DOB: 24-Apr-1980, 38 y.o.   MRN: [id number]   Hgb at arrival to MAU 9.7 now at 7.3

## 2018-05-29 NOTE — Discharge Summary (Signed)
Physician Discharge Summary   Patient ID: Megan Lara [id number] 38 y.o. [birth year]  Admit date: 05/28/2018  Discharge date and time: 05/29/2018    Admitting Physician: Noland Fordyce, MD   Discharge Physician: Shea Evans, MD  Admission Diagnoses:  Large Rectus sheath hematoma, left inferior epigastric artery bleeding   Discharge Diagnoses: Same, Interventional radiology guided left inferior epigastric branches active bleeding controlled with coils, stable patient  Admission Condition: poor due to pain and emerging hematoma  Discharged Condition: good, bleeding controlled, large hematoma but pain controlled with Motrin/ Oxycodone IR, stable hemoglobin, ambulating, tolerating regular diet, no nausea/ vomiting.   Indication for Admission/ Hospital course: Patient presented on the night after discharge on post-op day 2 of repeat c-section and tubal ligation with c/o sudden "pop in abdomen" followed by pain and burning after getting out of her recliner at home. She had not used much analgesic in the hospital post-op and was doing well and was discharged on POD #2. She reported pain in lower abdomen and swelling acutely and presented back to maternity triage.  She had labs and abdominal CT that noted a large 17x13x9 cm abdominal wall subfascial hematoma. Dr Ernestina Penna, attending MD, called Interventional radiologist to assess if she was a candidate for IR guided control of bleeding and after counseling, it was planned with incoming team in the morning who agreed to proceed.  Procedure to embolize active bleeding branches of left inferior epigastric artery went well. Patient was kept supine with groin pressure dressing for 4 hrs as advised, then ambulated a bit, so foley removed and pain management planned. She had serial CBCs, vitals, I/O, exam all stable. She didn't tolerate Percocet or oral Dilaudid due to headache but did well with Oxycodone-immediate release and Motrin well. So she  was discharged on hospital day 2. Plan for follow up with IR for possible drainage of abdominal wall hematoma if appeared liquifed after 5-6 days based on pain control   Consults: Interventional Radiology  Disposition: Discharge disposition: 01-Home or Self Care       Patient Instructions:  Allergies as of 05/29/2018   No Known Allergies     Medication List    TAKE these medications   acetaminophen 325 MG tablet Commonly known as:  TYLENOL Take 650 mg by mouth every 6 (six) hours as needed for mild pain or headache.   albuterol 108 (90 Base) MCG/ACT inhaler Commonly known as:  PROVENTIL HFA;VENTOLIN HFA Inhale 2 puffs into the lungs every 6 (six) hours as needed for wheezing.   busPIRone 5 MG tablet Commonly known as:  BUSPAR Take 5 mg by mouth 2 (two) times daily.   cetirizine 10 MG tablet Commonly known as:  ZYRTEC Take 10 mg by mouth at bedtime.   Fusion Plus Caps Take 1 capsule by mouth at bedtime.   ibuprofen 800 MG tablet Commonly known as:  ADVIL,MOTRIN Take 1 tablet (800 mg total) by mouth every 8 (eight) hours.   lansoprazole 15 MG capsule Commonly known as:  PREVACID Take 15 mg by mouth at bedtime.   levothyroxine 75 MCG tablet Commonly known as:  SYNTHROID, LEVOTHROID Take 75 mcg by mouth every other day.   levothyroxine 50 MCG tablet Commonly known as:  SYNTHROID, LEVOTHROID Take 50 mcg by mouth every other day.   multivitamin-prenatal 27-0.8 MG Tabs tablet Take 1 tablet by mouth at bedtime.   oxyCODONE 5 MG immediate release tablet Commonly known as:  Oxy IR/ROXICODONE Take 1-2 tablets (5-10 mg total) by mouth  every 4 (four) hours as needed for moderate pain.   sertraline 100 MG tablet Commonly known as:  ZOLOFT Take 100 mg by mouth at bedtime.      Activity: activity as tolerated, continue Postpartum restrictions  Diet: regular diet Wound Care: none needed  Follow-up with 5-6 days with IR if needed, office will call patient to assess  if drainage of hematoma is needed.   SignedRobley Fries 05/29/2018 5:09 PM

## 2018-11-26 ENCOUNTER — Ambulatory Visit
Admission: EM | Admit: 2018-11-26 | Discharge: 2018-11-26 | Disposition: A | Discharge status: Home / Self Care | Payer: 59 | Attending: Physician Assistant | Admitting: Physician Assistant

## 2018-11-26 DIAGNOSIS — J029 Acute pharyngitis, unspecified: Secondary | ICD-10-CM | POA: Insufficient documentation

## 2018-11-26 LAB — POCT RAPID STREP A (OFFICE): Rapid Strep A Screen: NEGATIVE

## 2018-11-26 MED ORDER — IPRATROPIUM BROMIDE 0.06 % NA SOLN: 2.0000 | Freq: Four times a day (QID) | NASAL | 0 refills | Status: DC

## 2018-11-26 MED ORDER — LIDOCAINE VISCOUS HCL 2 % MT SOLN: OROMUCOSAL | 0 refills | Status: DC

## 2018-11-26 NOTE — ED Triage Notes (Signed)
Pt states woke up today around 11am with nasal congestion, sore throat, and upper body aches.

## 2018-11-26 NOTE — ED Provider Notes (Signed)
[facility]    CSN: [id number] Arrival date & time: 11/26/18  1427      History   Chief Complaint Chief Complaint  Patient presents with  . Sore Throat    HPI Megan Lara is a 38 y.o. female.   38 year old female comes in for 1 day history of URI symptoms.  States she woke up with nasal congestion, rhinorrhea, sore throat, body aches.  Denies cough.  Denies fever, chills.  Denies abdominal pain, nausea, vomiting, diarrhea.  Denies shortness of breath, loss of taste or smell.  Denies swelling to throat, tripoding, drooling, trismus.  She denies obvious is sick/COVID contact.  She had COVID testing prior to arrival.  However, given significant sore throat, came in for further evaluation. History of tonsillectomy     Past Medical History:  Diagnosis Date  . Adjustment disorder with mixed anxiety and depressed mood 12/17/2016  . Anemia   . Anemia affecting pregnancy, antepartum 12/17/2016  . Asthma   . Chorioamnionitis, delivered, current hospitalization 12/17/2016  . Depression   . Hx of cellulitis of skin with lymphangitis    from last CS  . Hypothyroidism 12/17/2016  . Thyroid dysfunction in pregnancy in third trimester     Patient Active Problem List   Diagnosis Date Noted  . Hematoma of rectus sheath 05/28/2018  . Status post repeat low transverse cesarean section and BTL 05/25/2018  . Postpartum care following cesarean delivery (3/23) 05/25/2018  . Carpal tunnel syndrome, left upper limb 01/13/2017  . Carpal tunnel syndrome, right upper limb 01/13/2017  . Hypothyroidism 12/17/2016  . Adjustment disorder with mixed anxiety and depressed mood 12/17/2016    Past Surgical History:  Procedure Laterality Date  . CESAREAN SECTION N/A 12/16/2016   Procedure: CESAREAN SECTION;  Surgeon: Azucena Fallen, MD;  Location: [place];  Service: Obstetrics;  Laterality: N/A;  . CESAREAN SECTION WITH BILATERAL TUBAL LIGATION Bilateral 05/25/2018    Procedure: Repeat CESAREAN SECTION WITH BILATERAL TUBAL LIGATION;  Surgeon: Azucena Fallen, MD;  Location: [facility];  Service: Obstetrics;  Laterality: Bilateral;  EDD: 05/22/18  . GANGLION CYST EXCISION Left 4/15   3 rd toe  . HERNIA REPAIR Right 1991  . IR ANGIOGRAM PELVIS SELECTIVE OR SUPRASELECTIVE  05/28/2018  . IR ANGIOGRAM SELECTIVE EACH ADDITIONAL VESSEL  05/28/2018  . IR ANGIOGRAM SELECTIVE EACH ADDITIONAL VESSEL  05/28/2018  . IR ANGIOGRAM SELECTIVE EACH ADDITIONAL VESSEL  05/28/2018  . IR EMBO ART  VEN HEMORR LYMPH EXTRAV  INC GUIDE ROADMAPPING  05/28/2018  . IR US GUIDE VASC ACCESS RIGHT  05/28/2018  . TONSILLECTOMY  1997    OB History    Gravida  2   Para  2   Term  2   Preterm      AB      Living  1     SAB      TAB      Ectopic      Multiple  0   Live Births  1            Home Medications    Prior to Admission medications   Medication Sig Start Date End Date Taking? Authorizing Provider  acetaminophen (TYLENOL) 325 MG tablet Take 650 mg by mouth every 6 (six) hours as needed for mild pain or headache.    [provider]  albuterol (PROVENTIL HFA;VENTOLIN HFA) 108 (90 BASE) MCG/ACT inhaler Inhale 2 puffs into the lungs every 6 (six) hours as needed for  wheezing.    [provider]  busPIRone (BUSPAR) 5 MG tablet Take 5 mg by mouth 2 (two) times daily.    Emergency, Nurse, RN  cetirizine (ZYRTEC) 10 MG tablet Take 10 mg by mouth at bedtime.     [provider]  ibuprofen (ADVIL,MOTRIN) 800 MG tablet Take 1 tablet (800 mg total) by mouth every 8 (eight) hours. 05/27/18   Sigmon, Scarlette Slice, CNM  ipratropium (ATROVENT) 0.06 % nasal spray Place 2 sprays into both nostrils 4 (four) times daily. 11/26/18   Cathie Hoops,  V, PA-C  lansoprazole (PREVACID) 15 MG capsule Take 15 mg by mouth at bedtime.    [provider]  levothyroxine (SYNTHROID, LEVOTHROID) 50 MCG tablet Take 50 mcg by mouth every other day.  10/05/15   [provider]  levothyroxine (SYNTHROID, LEVOTHROID) 75 MCG tablet Take 75 mcg by mouth every other day.     [provider]  lidocaine (XYLOCAINE) 2 % solution 5-15 mL to gurgle as needed. 11/26/18   Cathie Hoops,  V, PA-C  sertraline (ZOLOFT) 100 MG tablet Take 100 mg by mouth at bedtime.    [provider]    Family History Family History  Adopted: Yes  Problem Relation Age of Onset  . Uterine cancer Maternal Grandmother     Social History Social History   Tobacco Use  . Smoking status: Former Smoker    Packs/day: 0.50    Years: 7.00    Pack years: 3.50    Types: Cigarettes    Quit date: 03/04/2013    Years since quitting: 5.7  . Smokeless tobacco: Never Used  . Tobacco comment: does some vape  Substance Use Topics  . Alcohol use: Not Currently    Alcohol/week: 0.0 standard drinks    Comment: 9 a week  . Drug use: No     Allergies   Patient has no known allergies.   Review of Systems Review of Systems  Reason unable to perform ROS: See HPI as above.     Physical Exam Triage Vital Signs ED Triage Vitals  Enc Vitals Group     BP 11/26/18 1438 125/77     Pulse Rate 11/26/18 1438 87     Resp 11/26/18 1438 18     Temp 11/26/18 1438 98.7 F (37.1 C)     Temp Source 11/26/18 1438 Oral     SpO2 11/26/18 1438 99 %     Weight --      Height --      Head Circumference --      Peak Flow --      Pain Score 11/26/18 1439 0     Pain Loc --      Pain Edu? --      Excl. in GC? --    No data found.  Updated Vital Signs BP 125/77 (BP Location: Left Arm)   Pulse 87   Temp 98.7 F (37.1 C) (Oral)   Resp 18   SpO2 99%   Physical Exam Constitutional:      General: She is not in acute distress.    Appearance: Normal appearance. She is not ill-appearing, toxic-appearing or diaphoretic.  HENT:     Head: Normocephalic and atraumatic.     Mouth/Throat:     Mouth: Mucous membranes are moist.     Pharynx: Oropharynx is clear. Uvula midline. Posterior  oropharyngeal erythema present. No oropharyngeal exudate.  Neck:     Musculoskeletal: Normal range of motion and neck supple.  Cardiovascular:     Rate and Rhythm: Normal rate and regular rhythm.     Heart sounds: Normal heart sounds. No murmur. No friction rub. No gallop.   Pulmonary:     Effort: Pulmonary effort is normal. No accessory muscle usage, prolonged expiration, respiratory distress or retractions.     Comments: Lungs clear to auscultation without adventitious lung sounds. Skin:    General: Skin is warm and dry.  Neurological:     General: No focal deficit present.     Mental Status: She is alert and oriented to person, place, and time.      UC Treatments / Results  Labs (all labs ordered are listed, but only abnormal results are displayed) Labs Reviewed  POCT RAPID STREP A (OFFICE) - Normal  CULTURE, GROUP A STREP [facility])    EKG   Radiology No results found.  Procedures Procedures (including critical care time)  Medications Ordered in UC Medications - No data to display  Initial Impression / Assessment and Plan / UC Course  I have reviewed the triage vital signs and the nursing notes.  Pertinent labs & imaging results that were available during my care of the patient were reviewed by me and considered in my medical decision making (see chart for details).    Rapid strep negative.  Patient to remain quarantined until COVID test returns.  Symptomatic treatment discussed.  Push fluids.  Return precautions given.  Patient expresses understanding and agrees to plan.  Final Clinical Impressions(s) / UC Diagnoses   Final diagnoses:  Sore throat    ED Prescriptions    Medication Sig Dispense Auth. Provider   lidocaine (XYLOCAINE) 2 % solution 5-15 mL to gurgle as needed. 150 mL ,  V, PA-C   ipratropium (ATROVENT) 0.06 % nasal spray Place 2 sprays into both nostrils 4 (four) times daily. 15 mL Belinda Fisher,  V, PA-C     PDMP not reviewed this encounter.    Belinda Fisher,  V, PA-C 11/26/18 1511

## 2018-11-26 NOTE — Discharge Instructions (Addendum)
Rapid strep negative. Continue to quarantine until COVID testing returns. Start lidocaine for sore throat, do not eat or drink for the next 40 mins after use as it can stunt your gag reflex. Atrovent for nasal congestion/drainage. You can use over the counter nasal saline rinse such as neti pot for nasal congestion. Monitor for any worsening of symptoms, swelling of the throat, trouble breathing, trouble swallowing, leaning forward to breath, drooling, go to the emergency department for further evaluation needed.  For sore throat/cough try using a honey-based tea. Use 3 teaspoons of honey with juice squeezed from half lemon. Place shaved pieces of ginger into 1/2-1 cup of water and warm over stove top. Then mix the ingredients and repeat every 4 hours as needed.

## 2018-11-29 LAB — CULTURE, GROUP A STREP (THRC)

## 2019-03-23 ENCOUNTER — Inpatient Hospital Stay (HOSPITAL_COMMUNITY)
Admission: EM | Admit: 2019-03-23 | Discharge: 2019-03-28 | DRG: 871 | Disposition: A | Discharge status: Home / Self Care | Payer: 59 | Attending: Internal Medicine | Admitting: Internal Medicine

## 2019-03-23 ENCOUNTER — Emergency Department (HOSPITAL_COMMUNITY): Payer: 59

## 2019-03-23 ENCOUNTER — Inpatient Hospital Stay (HOSPITAL_COMMUNITY): Payer: 59

## 2019-03-23 ENCOUNTER — Other Ambulatory Visit: Payer: Self-pay

## 2019-03-23 ENCOUNTER — Encounter (HOSPITAL_COMMUNITY): Payer: Self-pay | Admitting: Emergency Medicine

## 2019-03-23 DIAGNOSIS — E871 Hypo-osmolality and hyponatremia: Secondary | ICD-10-CM

## 2019-03-23 DIAGNOSIS — E872 Acidosis: Secondary | ICD-10-CM | POA: Diagnosis present

## 2019-03-23 DIAGNOSIS — F329 Major depressive disorder, single episode, unspecified: Secondary | ICD-10-CM | POA: Diagnosis present

## 2019-03-23 DIAGNOSIS — J45909 Unspecified asthma, uncomplicated: Secondary | ICD-10-CM | POA: Diagnosis present

## 2019-03-23 DIAGNOSIS — R509 Fever, unspecified: Secondary | ICD-10-CM | POA: Diagnosis present

## 2019-03-23 DIAGNOSIS — A419 Sepsis, unspecified organism: Secondary | ICD-10-CM | POA: Diagnosis not present

## 2019-03-23 DIAGNOSIS — F419 Anxiety disorder, unspecified: Secondary | ICD-10-CM | POA: Diagnosis present

## 2019-03-23 DIAGNOSIS — G030 Nonpyogenic meningitis: Secondary | ICD-10-CM | POA: Diagnosis not present

## 2019-03-23 DIAGNOSIS — Z20822 Contact with and (suspected) exposure to covid-19: Secondary | ICD-10-CM | POA: Diagnosis present

## 2019-03-23 DIAGNOSIS — E039 Hypothyroidism, unspecified: Secondary | ICD-10-CM | POA: Diagnosis present

## 2019-03-23 DIAGNOSIS — G039 Meningitis, unspecified: Secondary | ICD-10-CM | POA: Diagnosis not present

## 2019-03-23 DIAGNOSIS — Z79899 Other long term (current) drug therapy: Secondary | ICD-10-CM | POA: Diagnosis not present

## 2019-03-23 DIAGNOSIS — E869 Volume depletion, unspecified: Secondary | ICD-10-CM | POA: Diagnosis not present

## 2019-03-23 DIAGNOSIS — Z79890 Hormone replacement therapy (postmenopausal): Secondary | ICD-10-CM

## 2019-03-23 DIAGNOSIS — Z87891 Personal history of nicotine dependence: Secondary | ICD-10-CM | POA: Diagnosis not present

## 2019-03-23 DIAGNOSIS — E876 Hypokalemia: Secondary | ICD-10-CM

## 2019-03-23 LAB — INFLUENZA PANEL BY PCR (TYPE A & B)
Influenza A By PCR: NEGATIVE
Influenza B By PCR: NEGATIVE

## 2019-03-23 LAB — URINALYSIS, ROUTINE W REFLEX MICROSCOPIC
Bilirubin Urine: NEGATIVE
Glucose, UA: NEGATIVE mg/dL
Ketones, ur: 5 mg/dL — AB
Leukocytes,Ua: NEGATIVE
Nitrite: NEGATIVE
Protein, ur: NEGATIVE mg/dL
Specific Gravity, Urine: 1.01 (ref 1.005–1.030)
pH: 6 (ref 5.0–8.0)

## 2019-03-23 LAB — PROTIME-INR
INR: 1.1 (ref 0.8–1.2)
Prothrombin Time: 14.3 seconds (ref 11.4–15.2)

## 2019-03-23 LAB — CSF CELL COUNT WITH DIFFERENTIAL
Eosinophils, CSF: 0 % (ref 0–1)
Eosinophils, CSF: 0 % (ref 0–1)
Lymphs, CSF: 1 % — ABNORMAL LOW (ref 40–80)
Lymphs, CSF: 7 % — ABNORMAL LOW (ref 40–80)
Monocyte-Macrophage-Spinal Fluid: 3 % — ABNORMAL LOW (ref 15–45)
Monocyte-Macrophage-Spinal Fluid: 8 % — ABNORMAL LOW (ref 15–45)
RBC Count, CSF: 34875 /mm3 — ABNORMAL HIGH
RBC Count, CSF: 8600 /mm3 — ABNORMAL HIGH
Segmented Neutrophils-CSF: 96 % — ABNORMAL HIGH (ref 0–6)
Segmented Neutrophils-CSF: 85 % — ABNORMAL HIGH (ref 0–6)
Tube #: 1
Tube #: 4
WBC, CSF: 140 /mm3 (ref 0–5)
WBC, CSF: 46 /mm3 (ref 0–5)

## 2019-03-23 LAB — CBC WITH DIFFERENTIAL/PLATELET
Abs Immature Granulocytes: 0 10*3/uL (ref 0.00–0.07)
Basophils Absolute: 0 10*3/uL (ref 0.0–0.1)
Basophils Relative: 0 %
Eosinophils Absolute: 0 10*3/uL (ref 0.0–0.5)
Eosinophils Relative: 0 %
HCT: 37.8 % (ref 36.0–46.0)
Hemoglobin: 12.5 g/dL (ref 12.0–15.0)
Lymphocytes Relative: 8 %
Lymphs Abs: 1.3 10*3/uL (ref 0.7–4.0)
MCH: 31.6 pg (ref 26.0–34.0)
MCHC: 33.1 g/dL (ref 30.0–36.0)
MCV: 95.5 fL (ref 80.0–100.0)
Monocytes Absolute: 0.7 10*3/uL (ref 0.1–1.0)
Monocytes Relative: 4 %
Neutro Abs: 14.6 10*3/uL — ABNORMAL HIGH (ref 1.7–7.7)
Neutrophils Relative %: 88 %
Platelets: 276 10*3/uL (ref 150–400)
RBC: 3.96 MIL/uL (ref 3.87–5.11)
RDW: 12.6 % (ref 11.5–15.5)
WBC: 16.6 10*3/uL — ABNORMAL HIGH (ref 4.0–10.5)
nRBC: 0 % (ref 0.0–0.2)
nRBC: 0 /100 WBC

## 2019-03-23 LAB — CBC
HCT: 38.2 % (ref 36.0–46.0)
Hemoglobin: 12.1 g/dL (ref 12.0–15.0)
MCH: 31.8 pg (ref 26.0–34.0)
MCHC: 31.7 g/dL (ref 30.0–36.0)
MCV: 100.3 fL — ABNORMAL HIGH (ref 80.0–100.0)
Platelets: 298 10*3/uL (ref 150–400)
RBC: 3.81 MIL/uL — ABNORMAL LOW (ref 3.87–5.11)
RDW: 13.2 % (ref 11.5–15.5)
WBC: 17.9 10*3/uL — ABNORMAL HIGH (ref 4.0–10.5)
nRBC: 0 % (ref 0.0–0.2)

## 2019-03-23 LAB — MONONUCLEOSIS SCREEN: Mono Screen: NEGATIVE

## 2019-03-23 LAB — COMPREHENSIVE METABOLIC PANEL
ALT: 15 U/L (ref 0–44)
AST: 19 U/L (ref 15–41)
Albumin: 3.6 g/dL (ref 3.5–5.0)
Alkaline Phosphatase: 52 U/L (ref 38–126)
Anion gap: 11 (ref 5–15)
BUN: 7 mg/dL (ref 6–20)
CO2: 21 mmol/L — ABNORMAL LOW (ref 22–32)
Calcium: 8.8 mg/dL — ABNORMAL LOW (ref 8.9–10.3)
Chloride: 102 mmol/L (ref 98–111)
Creatinine, Ser: 0.77 mg/dL (ref 0.44–1.00)
GFR calc Af Amer: >60 mL/min (ref >60)
GFR calc non Af Amer: >60 mL/min (ref >60)
Glucose, Bld: 126 mg/dL — ABNORMAL HIGH (ref 70–99)
Potassium: 3.4 mmol/L — ABNORMAL LOW (ref 3.5–5.1)
Sodium: 134 mmol/L — ABNORMAL LOW (ref 135–145)
Total Bilirubin: 1.2 mg/dL (ref 0.3–1.2)
Total Protein: 6.5 g/dL (ref 6.5–8.1)

## 2019-03-23 LAB — APTT: aPTT: 29 seconds (ref 24–36)

## 2019-03-23 LAB — CREATININE, SERUM
Creatinine, Ser: 0.69 mg/dL (ref 0.44–1.00)
GFR calc Af Amer: >60 mL/min (ref >60)
GFR calc non Af Amer: >60 mL/min (ref >60)

## 2019-03-23 LAB — PHOSPHORUS: Phosphorus: 2.9 mg/dL (ref 2.5–4.6)

## 2019-03-23 LAB — POC SARS CORONAVIRUS 2 AG -  ED: SARS Coronavirus 2 Ag: NEGATIVE

## 2019-03-23 LAB — RESPIRATORY PANEL BY RT PCR (FLU A&B, COVID)
Influenza A by PCR: NEGATIVE
Influenza B by PCR: NEGATIVE
SARS Coronavirus 2 by RT PCR: NEGATIVE

## 2019-03-23 LAB — GLUCOSE, CSF: Glucose, CSF: 73 mg/dL — ABNORMAL HIGH (ref 40–70)

## 2019-03-23 LAB — MAGNESIUM: Magnesium: 1.8 mg/dL (ref 1.7–2.4)

## 2019-03-23 LAB — PREGNANCY, URINE: Preg Test, Ur: NEGATIVE

## 2019-03-23 LAB — TSH: TSH: 0.851 u[IU]/mL (ref 0.350–4.500)

## 2019-03-23 LAB — LACTIC ACID, PLASMA
Lactic Acid, Venous: 0.8 mmol/L (ref 0.5–1.9)
Lactic Acid, Venous: 1.9 mmol/L (ref 0.5–1.9)

## 2019-03-23 LAB — PROTEIN, CSF: Total  Protein, CSF: 47 mg/dL — ABNORMAL HIGH (ref 15–45)

## 2019-03-23 MED ORDER — VANCOMYCIN HCL IN DEXTROSE 1-5 GM/200ML-% IV SOLN: 1000.0000 mg | Freq: Two times a day (BID) | INTRAVENOUS | Status: DC

## 2019-03-23 MED ORDER — SODIUM CHLORIDE 0.9 % IV SOLN
2.0000 g | Freq: Two times a day (BID) | INTRAVENOUS | Status: DC
  Administered 2019-03-24 – 2019-03-27 (×7): 2 g via INTRAVENOUS
  Filled 2019-03-23: qty 20
  Filled 2019-03-23 (×7): qty 2

## 2019-03-23 MED ORDER — VANCOMYCIN HCL 1750 MG/350ML IV SOLN
1750.0000 mg | Freq: Once | INTRAVENOUS | Status: AC
  Administered 2019-03-23: 1750 mg via INTRAVENOUS
  Filled 2019-03-23: qty 350

## 2019-03-23 MED ORDER — LEVOTHYROXINE SODIUM 50 MCG PO TABS: 50.0000 ug | ORAL_TABLET | ORAL | Status: DC

## 2019-03-23 MED ORDER — LEVOTHYROXINE SODIUM 75 MCG PO TABS
75.0000 ug | ORAL_TABLET | ORAL | Status: DC
  Administered 2019-03-23: 75 ug via ORAL
  Filled 2019-03-23: qty 1

## 2019-03-23 MED ORDER — LEVOTHYROXINE SODIUM 50 MCG PO TABS
50.0000 ug | ORAL_TABLET | ORAL | Status: DC
  Administered 2019-03-23: 50 ug via ORAL
  Filled 2019-03-23: qty 1

## 2019-03-23 MED ORDER — SODIUM CHLORIDE 0.9 % IV BOLUS
1000.0000 mL | Freq: Once | INTRAVENOUS | Status: AC
  Administered 2019-03-23: 1000 mL via INTRAVENOUS

## 2019-03-23 MED ORDER — ACETAMINOPHEN 650 MG RE SUPP: 650.0000 mg | Freq: Four times a day (QID) | RECTAL | Status: DC | PRN

## 2019-03-23 MED ORDER — LEVOTHYROXINE SODIUM 50 MCG PO TABS
50.0000 ug | ORAL_TABLET | ORAL | Status: DC
  Administered 2019-03-25 – 2019-03-27 (×2): 50 ug via ORAL
  Filled 2019-03-23 (×3): qty 1

## 2019-03-23 MED ORDER — DEXTROSE 5 % IV SOLN: 10.0000 mg/kg | Freq: Three times a day (TID) | INTRAVENOUS | Status: DC

## 2019-03-23 MED ORDER — LEVOTHYROXINE SODIUM 75 MCG PO TABS
75.0000 ug | ORAL_TABLET | ORAL | Status: DC
  Administered 2019-03-24 – 2019-03-28 (×3): 75 ug via ORAL
  Filled 2019-03-23 (×5): qty 1

## 2019-03-23 MED ORDER — SERTRALINE HCL 100 MG PO TABS
100.0000 mg | ORAL_TABLET | Freq: Every day | ORAL | Status: DC
  Administered 2019-03-23 – 2019-03-27 (×5): 100 mg via ORAL
  Filled 2019-03-23 (×5): qty 1

## 2019-03-23 MED ORDER — VANCOMYCIN HCL IN DEXTROSE 1-5 GM/200ML-% IV SOLN: 1000.0000 mg | Freq: Once | INTRAVENOUS | Status: DC

## 2019-03-23 MED ORDER — POTASSIUM CHLORIDE IN NACL 20-0.9 MEQ/L-% IV SOLN
INTRAVENOUS | Status: DC
  Filled 2019-03-23 (×3): qty 1000

## 2019-03-23 MED ORDER — VANCOMYCIN HCL IN DEXTROSE 1-5 GM/200ML-% IV SOLN
1000.0000 mg | Freq: Two times a day (BID) | INTRAVENOUS | Status: DC
  Administered 2019-03-24: 1000 mg via INTRAVENOUS
  Filled 2019-03-23 (×2): qty 200

## 2019-03-23 MED ORDER — LORATADINE 10 MG PO TABS
10.0000 mg | ORAL_TABLET | Freq: Every day | ORAL | Status: DC
  Administered 2019-03-23 – 2019-03-28 (×6): 10 mg via ORAL
  Filled 2019-03-23 (×6): qty 1

## 2019-03-23 MED ORDER — ALBUTEROL SULFATE (2.5 MG/3ML) 0.083% IN NEBU
2.5000 mg | INHALATION_SOLUTION | Freq: Four times a day (QID) | RESPIRATORY_TRACT | Status: DC | PRN
  Administered 2019-03-24: 2.5 mg via RESPIRATORY_TRACT
  Filled 2019-03-23: qty 3

## 2019-03-23 MED ORDER — SODIUM CHLORIDE 0.9 % IV BOLUS
1000.0000 mL | Freq: Once | INTRAVENOUS | Status: AC
  Administered 2019-03-23: 14:00:00 1000 mL via INTRAVENOUS

## 2019-03-23 MED ORDER — ONDANSETRON HCL 4 MG/2ML IJ SOLN
4.0000 mg | Freq: Once | INTRAMUSCULAR | Status: AC
  Administered 2019-03-23: 4 mg via INTRAVENOUS
  Filled 2019-03-23: qty 2

## 2019-03-23 MED ORDER — DEXTROSE 5 % IV SOLN
10.0000 mg/kg | Freq: Three times a day (TID) | INTRAVENOUS | Status: DC
  Filled 2019-03-23 (×2): qty 15.6

## 2019-03-23 MED ORDER — ACETAMINOPHEN 325 MG PO TABS
650.0000 mg | ORAL_TABLET | Freq: Four times a day (QID) | ORAL | Status: AC | PRN
  Administered 2019-03-23 – 2019-03-24 (×2): 650 mg via ORAL
  Filled 2019-03-23 (×2): qty 2

## 2019-03-23 MED ORDER — DEXTROSE 5 % IV SOLN
10.0000 mg/kg | Freq: Three times a day (TID) | INTRAVENOUS | Status: DC
  Administered 2019-03-23 – 2019-03-26 (×8): 780 mg via INTRAVENOUS
  Filled 2019-03-23 (×10): qty 15.6

## 2019-03-23 MED ORDER — ACETAMINOPHEN 325 MG PO TABS
650.0000 mg | ORAL_TABLET | Freq: Once | ORAL | Status: AC
  Administered 2019-03-23: 650 mg via ORAL

## 2019-03-23 MED ORDER — DEXAMETHASONE SODIUM PHOSPHATE 10 MG/ML IJ SOLN
10.0000 mg | Freq: Once | INTRAMUSCULAR | Status: AC
  Administered 2019-03-23: 10 mg via INTRAVENOUS
  Filled 2019-03-23: qty 1

## 2019-03-23 MED ORDER — ENOXAPARIN SODIUM 40 MG/0.4ML ~~LOC~~ SOLN
40.0000 mg | SUBCUTANEOUS | Status: DC
  Administered 2019-03-23 – 2019-03-27 (×5): 40 mg via SUBCUTANEOUS
  Filled 2019-03-23 (×5): qty 0.4

## 2019-03-23 MED ORDER — IOHEXOL 350 MG/ML SOLN
75.0000 mL | Freq: Once | INTRAVENOUS | Status: AC | PRN
  Administered 2019-03-23: 75 mL via INTRAVENOUS

## 2019-03-23 MED ORDER — ALBUTEROL SULFATE HFA 108 (90 BASE) MCG/ACT IN AERS
2.0000 | INHALATION_SPRAY | Freq: Four times a day (QID) | RESPIRATORY_TRACT | Status: DC | PRN
  Filled 2019-03-23: qty 6.7

## 2019-03-23 MED ORDER — SODIUM CHLORIDE 0.9 % IV SOLN
2.0000 g | Freq: Once | INTRAVENOUS | Status: AC
  Administered 2019-03-23: 2 g via INTRAVENOUS
  Filled 2019-03-23: qty 20

## 2019-03-23 MED ORDER — BUSPIRONE HCL 5 MG PO TABS
5.0000 mg | ORAL_TABLET | Freq: Two times a day (BID) | ORAL | Status: DC
  Administered 2019-03-23 – 2019-03-28 (×10): 5 mg via ORAL
  Filled 2019-03-23 (×10): qty 1

## 2019-03-23 NOTE — ED Notes (Signed)
Temp retaken 99.3 Pt reporting some relief in HA pain. Rating 6/10 now

## 2019-03-23 NOTE — ED Notes (Signed)
Labs and second set of blood cultures drawn, labeled with 2 pt identifiers, and sent to lab All meds given per [facility]. Name/DOB verified with pt

## 2019-03-23 NOTE — H&P (Signed)
History and Physical  Megan Lara MVH:[id number] DOB: 02/04/1981 DOA: 03/23/2019  Referring physician: ER provider PCP: Megan Palmer, MD  Outpatient Specialists:    Patient coming from: Home  Chief Complaint: Fever, headache and neck pain  HPI:  Patient is a 39 year old Caucasian female with past medical history significant for asthma, hypothyroidism, depression, anemia and chorioamnionitis.  Patient lives at home with 56-year-old as well as a 44-month-old.  Patient presents with 1 day history of fever, headache, neck pain, nausea and vomiting, myalgia, weakness, nausea and vomiting.  There is associated photophobia.  No chest pain, no shortness of breath, no URI symptoms and no urinary symptoms reported.  On presentation to the hospital, T-max was 103.2, with significant tachycardia that peaked at 134 bpm, leukocytosis of 16.6.  Patient has undergone spinal tap and final analysis are pending.  Covid is negative, influenza is negative.  CT the head is nonrevealing as well as the chest x-ray.  Systolic blood pressure on presentation was as low as 90 mmHg.  Patient has been admitted for possible meningitis.  ER provider has discussed with infectious disease team.  Patient has received IV vancomycin, Rocephin and Decadron.  Hospitalist team has been asked to admit patient for further assessment and management.  ED Course: As documented above, T-max of 103.2 F, current heart rate is 118 beats per minutes, current blood pressure is 102/71 mmHg.  Patient has undergone spinal tap and final analysis is pending.  Patient has received IV vancomycin, Rocephin and Decadron.  Pertinent labs: As documented above.  EKG: Independently reviewed.  Sinus tachycardia.  Imaging: independently reviewed.   Review of Systems:  Negative for  visual changes, sore throat, rash, chest pain, SOB, dysuria, bleeding.  Past Medical History:  Diagnosis Date  . Adjustment disorder with mixed anxiety and  depressed mood 12/17/2016  . Anemia   . Anemia affecting pregnancy, antepartum 12/17/2016  . Asthma   . Chorioamnionitis, delivered, current hospitalization 12/17/2016  . Depression   . Hx of cellulitis of skin with lymphangitis    from last CS  . Hypothyroidism 12/17/2016  . Thyroid dysfunction in pregnancy in third trimester     Past Surgical History:  Procedure Laterality Date  . CESAREAN SECTION N/A 12/16/2016   Procedure: CESAREAN SECTION;  Surgeon: Shea Evans, MD;  Location: [facility] BIRTHING SUITES;  Service: Obstetrics;  Laterality: N/A;  . CESAREAN SECTION WITH BILATERAL TUBAL LIGATION Bilateral 05/25/2018   Procedure: Repeat CESAREAN SECTION WITH BILATERAL TUBAL LIGATION;  Surgeon: Shea Evans, MD;  Location: [facility] LD ORS;  Service: Obstetrics;  Laterality: Bilateral;  EDD: 05/22/18  . GANGLION CYST EXCISION Left 4/15   3 rd toe  . HERNIA REPAIR Right 1991  . IR ANGIOGRAM PELVIS SELECTIVE OR SUPRASELECTIVE  05/28/2018  . IR ANGIOGRAM SELECTIVE EACH ADDITIONAL VESSEL  05/28/2018  . IR ANGIOGRAM SELECTIVE EACH ADDITIONAL VESSEL  05/28/2018  . IR ANGIOGRAM SELECTIVE EACH ADDITIONAL VESSEL  05/28/2018  . IR EMBO ART  VEN HEMORR LYMPH EXTRAV  INC GUIDE ROADMAPPING  05/28/2018  . IR US GUIDE VASC ACCESS RIGHT  05/28/2018  . TONSILLECTOMY  1997     reports that she quit smoking about 6 years ago. Her smoking use included cigarettes. She has a 3.50 pack-year smoking history. She has never used smokeless tobacco. She reports previous alcohol use. She reports that she does not use drugs.  No Known Allergies  Family History  Adopted: Yes  Problem Relation Age of Onset  . Uterine cancer Maternal Grandmother  Prior to Admission medications   Medication Sig Start Date End Date Taking? Authorizing Provider  acetaminophen (TYLENOL) 500 MG tablet Take 1,000 mg by mouth every 6 (six) hours as needed for mild pain, moderate pain or headache.    Yes [provider]  albuterol  (PROVENTIL HFA;VENTOLIN HFA) 108 (90 BASE) MCG/ACT inhaler Inhale 2 puffs into the lungs every 6 (six) hours as needed for wheezing.   Yes [provider]  busPIRone (BUSPAR) 5 MG tablet Take 5 mg by mouth 2 (two) times daily.   Yes Emergency, Nurse, RN  cetirizine (ZYRTEC) 10 MG tablet Take 10 mg by mouth at bedtime.    Yes [provider]  ibuprofen (ADVIL) 200 MG tablet Take 800 mg by mouth every 6 (six) hours as needed for mild pain or moderate pain.   Yes [provider]  levothyroxine (SYNTHROID, LEVOTHROID) 50 MCG tablet Take 50 mcg by mouth every other day.  10/05/15  Yes [provider]  levothyroxine (SYNTHROID, LEVOTHROID) 75 MCG tablet Take 75 mcg by mouth every other day.    Yes [provider]  sertraline (ZOLOFT) 100 MG tablet Take 100 mg by mouth at bedtime.   Yes [provider]  ibuprofen (ADVIL,MOTRIN) 800 MG tablet Take 1 tablet (800 mg total) by mouth every 8 (eight) hours. Patient not taking: Reported on 03/23/2019 05/27/18   Karena Addison, CNM  ipratropium (ATROVENT) 0.06 % nasal spray Place 2 sprays into both nostrils 4 (four) times daily. Patient not taking: Reported on 03/23/2019 11/26/18   Belinda Fisher, PA-C  lidocaine (XYLOCAINE) 2 % solution 5-15 mL to gurgle as needed. Patient not taking: Reported on 03/23/2019 11/26/18   Belinda Fisher, PA-C    Physical Exam: Vitals:   03/23/19 1817 03/23/19 1845 03/23/19 1912 03/23/19 1919  BP: (!) 105/53 (!) 109/54 (!) 90/43 102/71  Pulse:      Resp:      Temp: (!) 102.9 F (39.4 C)     TempSrc: Oral     SpO2:      Weight:      Height:         Constitutional:  . Acutely ill looking.   Eyes:  . No pallor. No jaundice.  ENMT:  . external ears, nose appear normal Neck:  . No obvious nerve stiffness.    [name] Kitchen Respiratory:  . CTA bilaterally, no w/r/r.  . Respiratory effort normal. No retractions or accessory muscle use Cardiovascular:  . S1S2, tachycardia . No LE  extremity edema   Abdomen:  . Abdomen is soft and non tender. Organs are difficult to assess. Neurologic:  . Awake and alert. . Moves all limbs.  Wt Readings from Last 3 Encounters:  03/23/19 78 kg  05/25/18 78 kg  05/21/18 78 kg    I have personally reviewed following labs and imaging studies  Labs on Admission:  CBC: Recent Labs  Lab 03/23/19 1437  WBC 16.6*  NEUTROABS 14.6*  HGB 12.5  HCT 37.8  MCV 95.5  PLT 276   Basic Metabolic Panel: Recent Labs  Lab 03/23/19 1437  NA 134*  K 3.4*  CL 102  CO2 21*  GLUCOSE 126*  BUN 7  CREATININE 0.77  CALCIUM 8.8*   Liver Function Tests: Recent Labs  Lab 03/23/19 1437  AST 19  ALT 15  ALKPHOS 52  BILITOT 1.2  PROT 6.5  ALBUMIN 3.6   No results for input(s): LIPASE, AMYLASE in the last 168 hours. No  results for input(s): AMMONIA in the last 168 hours. Coagulation Profile: Recent Labs  Lab 03/23/19 1702  INR 1.1   Cardiac Enzymes: No results for input(s): CKTOTAL, CKMB, CKMBINDEX, TROPONINI in the last 168 hours. BNP (last 3 results) No results for input(s): PROBNP in the last 8760 hours. HbA1C: No results for input(s): HGBA1C in the last 72 hours. CBG: No results for input(s): GLUCAP in the last 168 hours. Lipid Profile: No results for input(s): CHOL, HDL, LDLCALC, TRIG, CHOLHDL, LDLDIRECT in the last 72 hours. Thyroid Function Tests: No results for input(s): TSH, T4TOTAL, FREET4, T3FREE, THYROIDAB in the last 72 hours. Anemia Panel: No results for input(s): VITAMINB12, FOLATE, FERRITIN, TIBC, IRON, RETICCTPCT in the last 72 hours. Urine analysis:    Component Value Date/Time   COLORURINE YELLOW 03/23/2019 1526   APPEARANCEUR HAZY (A) 03/23/2019 1526   LABSPEC 1.010 03/23/2019 1526   PHURINE 6.0 03/23/2019 1526   GLUCOSEU NEGATIVE 03/23/2019 1526   HGBUR SMALL (A) 03/23/2019 1526   BILIRUBINUR NEGATIVE 03/23/2019 1526   BILIRUBINUR N 04/12/2014 1437   KETONESUR 5 (A) 03/23/2019 1526    PROTEINUR NEGATIVE 03/23/2019 1526   UROBILINOGEN negative 04/12/2014 1437   UROBILINOGEN 0.2 09/12/2012 1714   NITRITE NEGATIVE 03/23/2019 [place] 03/23/2019 1526   Sepsis Labs: @LABRCNTIP (procalcitonin:4,lacticidven:4) ) Recent Results (from the past 240 hour(s))  Respiratory Panel by RT PCR (Flu A&B, Covid) - Nasopharyngeal Swab     Status: None   Collection Time: 03/23/19  5:43 PM   Specimen: Nasopharyngeal Swab  Result Value Ref Range Status   SARS Coronavirus 2 by RT PCR NEGATIVE NEGATIVE Final    Comment: (NOTE) SARS-CoV-2 target nucleic acids are NOT DETECTED. The SARS-CoV-2 RNA is generally detectable in upper respiratoy specimens during the acute phase of infection. The lowest concentration of SARS-CoV-2 viral copies this assay can detect is 131 copies/mL. A negative result does not preclude SARS-Cov-2 infection and should not be used as the sole basis for treatment or other patient management decisions. A negative result may occur with  improper specimen collection/handling, submission of specimen other than nasopharyngeal swab, presence of viral mutation(s) within the areas targeted by this assay, and inadequate number of viral copies (<131 copies/mL). A negative result must be combined with clinical observations, patient history, and epidemiological information. The expected result is Negative. Fact Sheet for Patients:  PinkCheek.be Fact Sheet for Healthcare Providers:  GravelBags.it This test is not yet ap proved or cleared by the [place] FDA and  has been authorized for detection and/or diagnosis of SARS-CoV-2 by FDA under an Emergency Use Authorization (EUA). This EUA will remain  in effect (meaning this test can be used) for the duration of the COVID-19 declaration under Section 564(b)(1) of the Act, 21 U.S.C. section 360bbb-3(b)(1), unless the authorization is terminated  or revoked sooner.    Influenza A by PCR NEGATIVE NEGATIVE Final   Influenza B by PCR NEGATIVE NEGATIVE Final    Comment: (NOTE) The Xpert Xpress SARS-CoV-2/FLU/RSV assay is intended as an aid in  the diagnosis of influenza from Nasopharyngeal swab specimens and  should not be used as a sole basis for treatment. Nasal washings and  aspirates are unacceptable for Xpert Xpress SARS-CoV-2/FLU/RSV  testing. Fact Sheet for Patients: PinkCheek.be Fact Sheet for Healthcare Providers: GravelBags.it This test is not yet approved or cleared by the [place] FDA and  has been authorized for detection and/or diagnosis of SARS-CoV-2 by  FDA under an Emergency Use Authorization (EUA).  This EUA will remain  in effect (meaning this test can be used) for the duration of the  Covid-19 declaration under Section 564(b)(1) of the Act, 21  U.S.C. section 360bbb-3(b)(1), unless the authorization is  terminated or revoked. Performed at [facility], [address], [place], [place] [address]   CSF culture with Stat gram stain     Status: None (Preliminary result)   Collection Time: 03/23/19  6:06 PM   Specimen: CSF; Cerebrospinal Fluid  Result Value Ref Range Status   Specimen Description CSF  Final   Special Requests NONE  Final   Gram Stain   Final    WBC PRESENT, PREDOMINANTLY PMN NO ORGANISMS SEEN CYTOSPIN SMEAR Performed at [facility], [address], [place], [place] [address]    Culture PENDING  Incomplete   Report Status PENDING  Incomplete      Radiological Exams on Admission: CT Head Wo Contrast  Result Date: 03/23/2019 CLINICAL DATA:  Headache. EXAM: CT HEAD WITHOUT CONTRAST TECHNIQUE: Contiguous axial images were obtained from the base of the skull through the vertex without intravenous contrast. COMPARISON:  October 11, 2015 FINDINGS: Brain: No evidence of acute infarction, hemorrhage, hydrocephalus,  extra-axial collection or mass lesion/mass effect. Vascular: No hyperdense vessel or unexpected calcification. Skull: Normal. Negative for fracture or focal lesion. Sinuses/Orbits: No acute finding. Other: The metallic density foreign body seen within the subcutaneous tissues adjacent to the anterior aspect of the frontal sinus on the left on the prior study is no longer present. IMPRESSION: No acute intracranial pathology. Electronically Signed   By: Aram Candela M.D.   On: 03/23/2019 17:35   DG Chest Portable 1 View  Result Date: 03/23/2019 CLINICAL DATA:  Fever and short of breath EXAM: PORTABLE CHEST 1 VIEW COMPARISON:  09/12/2012 FINDINGS: The heart size and mediastinal contours are within normal limits. Both lungs are clear. The visualized skeletal structures are unremarkable. IMPRESSION: No active disease. Electronically Signed   By: Marlan Palau M.D.   On: 03/23/2019 14:06    EKG: Independently reviewed.   Active Problems:   Meningitis   Assessment/Plan Meningitis/sepsis syndrome/SIRS: -Admit patient for further assessment and management -ER provider has contacted infectious disease team -Continue IV vancomycin and Rocephin -Patient has received IV Decadron -Low threshold to add IV acyclovir -Follow final culture results -Furtther management will depend on hospital course  Volume depletion: Cautious hydration Continue to monitor closely.  Mild hyponatremia: Likely secondary to volume depletion. Repeat BMP after hydration.  Hypokalemia: Monitor and replete. Check magnesium level.  DVT prophylaxis: Subcutaneous Lovenox Code Status: Full code Family Communication:  Disposition Plan: Home eventually Consults called: ER provider has contacted infectious disease team on call Admission status: Inpatient  Severity of illness: Patient is bordering on being critically ill.  Patient has sepsis syndrome/sepsis likely related to meningitis.  Patient will be admitted and  managed on an inpatient basis, as patient is at risk of deterioration.  Time spent: 65 minutes  Berton Mount, MD  [facility] Pager #: [phone] 7PM-7AM contact night coverage as above  03/23/2019, 8:00 PM

## 2019-03-23 NOTE — ED Notes (Signed)
Pt back from CT without incidence 

## 2019-03-23 NOTE — ED Notes (Signed)
Pt is puking

## 2019-03-23 NOTE — ED Notes (Addendum)
Date and time results received: 01/19/212005  Test: WBC count in CSF Critical Value: 140  Name of Provider Notified: Berton Mount  Orders Received? Or Actions Taken?: no new orders at this time

## 2019-03-23 NOTE — ED Provider Notes (Signed)
Patient seen with PA-C Aberman.  Patient with a 3-day history of generalized headache with fever.  Denies thunderclap onset.  Associated body aches, neck pain, weakness and myalgia.  Temperature up to 104.  Denies any cough, runny nose or sore throat.  Has had several episodes of vomiting and nausea.  Patient appears ill but nontoxic. Physical Exam  BP (!) 118/92   Pulse (!) 118   Temp (!) 102 F (38.9 C) (Oral)   Resp (!) 24   Ht 5\' 4"  (1.626 m)   Wt 78 kg   SpO2 100%   BMI 29.52 kg/m   Physical Exam   Ill-appearing but nontoxic.  No meningismus, 5/5 strength throughout, tachycardic to the 120s  ED Course/Procedures   Clinical Course as of Mar 22 2124  Tue Mar 23, 2019  1536 Given her elevated WBC and negative COVID test will call code sepsis.   WBC(!): 16.6 [CA]  1723 Spoke to CT tech to push patient to the top of the list for scan so we can do the lumbar puncture.    [CA]  Mar 25, 2019 Spoke with Dr. [id number] who agrees to evaluate patient and admit for further treatment. He recommends a consult to ID to ensure treatment is correct.    [CA]  1837 Spoke to Dr. Dartha Lodge who recommends holding acyclovir given patient does not display signs of encephalitis.   [CA]    Clinical Course User Index [CA] Luciana Axe, PA-C    .Lumbar Puncture  Date/Time: 03/23/2019 6:09 PM Performed by: 03/25/2019, MD Authorized by: Glynn Octave, MD   Consent:    Consent obtained:  Written   Consent given by:  Patient   Risks discussed:  Nerve damage, infection, pain, repeat procedure, bleeding and headache Pre-procedure details:    Procedure purpose:  Diagnostic   Preparation: Patient was prepped and draped in usual sterile fashion   Sedation:    Sedation type:  Anxiolysis Anesthesia (see MAR for exact dosages):    Anesthesia method:  Local infiltration   Local anesthetic:  Lidocaine 1% w/o epi Procedure details:    Lumbar space:  L4-L5 interspace   Patient position:  L lateral  decubitus   Needle gauge:  22   Needle type:  Spinal needle - Quincke tip   Ultrasound guidance: no     Number of attempts:  2   Fluid appearance:  Blood-tinged then clearing and cloudy   Tubes of fluid:  3   Total volume (ml):  4 Post-procedure:    Puncture site:  Adhesive bandage applied   Patient tolerance of procedure:  Tolerated well, no immediate complications    MDM  Patient with headache and fever and tachycardia.  She has no meningismus.  No source identified.  Initial Covid test is negative. Patient given broad-spectrum antibiotics.  Lumbar puncture as above after discussion of risks and benefits.  LP was traumatic but was some clearing of the fluid which still remains xanthochromic.  Discussed with neurosurgery PA-C Costella who agrees with CTA which was performed and negative.  Low suspicion for subarachnoid hemorrhage.  Her story is more concerning for infection and possible meningitis.  Is possible.  Xanthochromia may be due to her infection itself. He recommends treatment for meningitis and further evaluation with if she is not improving with angiogram or MRA.  Antibiotics will be continued and patient admitted to medical service.  CRITICAL CARE Performed by: Glynn Octave Total critical care time: 35 minutes Critical care time was exclusive of  separately billable procedures and treating other patients. Critical care was necessary to treat or prevent imminent or life-threatening deterioration. Critical care was time spent personally by me on the following activities: development of treatment plan with patient and/or surrogate as well as nursing, discussions with consultants, evaluation of patient's response to treatment, examination of patient, obtaining history from patient or surrogate, ordering and performing treatments and interventions, ordering and review of laboratory studies, ordering and review of radiographic studies, pulse oximetry and re-evaluation of  patient's condition.     Ezequiel Essex, MD 03/23/19 2131

## 2019-03-23 NOTE — ED Notes (Signed)
Assumed care of pt. Pt resting on cart. Alert, breathing easy, non-labored. purwick placed. Pt denies any other needs at this time. Will continue to monitor

## 2019-03-23 NOTE — ED Notes (Signed)
Pt transported to RM. 1T98 in NAD with all belongings via cart transport. Pt alert, speaking in full sentences. Breathing easy, non-labored. VSS on monitors.

## 2019-03-23 NOTE — ED Triage Notes (Signed)
Pt reports fever to 103.3 at home, headache since Sunday. Denies cough, dysuria. Pt awake, alert, oriented x4. Ambulatory in triage.

## 2019-03-23 NOTE — ED Provider Notes (Addendum)
[facility] Provider Note   CSN: [id number] Arrival date & time: 03/23/19  1236     History Chief Complaint  Patient presents with  . Fever  . Headache    Megan Lara is a 39 y.o. female with a past medical history significant for adjustment disorder with mixed anxiety and depressed mood, anemia, asthma, and hypothyroidism who presents to the ED due to gradual onset and consistent headache since Sunday evening.  Headache is located in the bilateral frontal region which she rates as an 8/10.  Headache is associated with neck pain, weakness, myalgias, and fever x1 day.  T-max 104 this morning.  Patient has tried Tylenol and ibuprofen with no relief.  Patient admits to chest tightness and mild shortness of breath.  She admits to having a history of asthma.  Patient denies abdominal pain and vomiting, but admits to intermittent nausea and non-bloody diarrhea.  Patient denies Covid exposures and sick contacts. Patient denies IVDU and history of HIV. Patient denies chest pain, urinary symptoms, and vaginal symptoms.     Past Medical History:  Diagnosis Date  . Adjustment disorder with mixed anxiety and depressed mood 12/17/2016  . Anemia   . Anemia affecting pregnancy, antepartum 12/17/2016  . Asthma   . Chorioamnionitis, delivered, current hospitalization 12/17/2016  . Depression   . Hx of cellulitis of skin with lymphangitis    from last CS  . Hypothyroidism 12/17/2016  . Thyroid dysfunction in pregnancy in third trimester     Patient Active Problem List   Diagnosis Date Noted  . 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 Hematoma of rectus sheath 05/28/2018  . Status post repeat low transverse cesarean section and BTL 05/25/2018  . Postpartum care following cesarean delivery (3/23) 05/25/2018  . Carpal tunnel syndrome, left upper limb 01/13/2017  . Carpal tunnel syndrome, right upper limb 01/13/2017  . Hypothyroidism 12/17/2016  . Adjustment  disorder with mixed anxiety and depressed mood 12/17/2016    Past Surgical History:  Procedure Laterality Date  . CESAREAN SECTION N/A 12/16/2016   Procedure: CESAREAN SECTION;  Surgeon: Shea EvansMody, Vaishali, MD;  [facility];  Service: Obstetrics;  Laterality: N/A;  . CESAREAN SECTION WITH BILATERAL TUBAL LIGATION Bilateral 05/25/2018   Procedure: Repeat CESAREAN SECTION WITH BILATERAL TUBAL LIGATION;  Surgeon: Shea EvansMody, Vaishali, MD;  Location: [facility] LD ORS;  Service: Obstetrics;  Laterality: Bilateral;  EDD: 05/22/18  . GANGLION CYST EXCISION Left 4/15   3 rd toe  . HERNIA REPAIR Right 1991  . IR ANGIOGRAM PELVIS SELECTIVE OR SUPRASELECTIVE  05/28/2018  . IR ANGIOGRAM SELECTIVE EACH ADDITIONAL VESSEL  05/28/2018  . IR ANGIOGRAM SELECTIVE EACH ADDITIONAL VESSEL  05/28/2018  . IR ANGIOGRAM SELECTIVE EACH ADDITIONAL VESSEL  05/28/2018  . IR EMBO ART  VEN HEMORR LYMPH EXTRAV  INC GUIDE ROADMAPPING  05/28/2018  . IR US GUIDE VASC ACCESS RIGHT  05/28/2018  . TONSILLECTOMY  1997     OB History    Gravida  2   Para  2   Term  2   Preterm      AB      Living  1     SAB      TAB      Ectopic      Multiple  0   Live Births  1           Family History  Adopted: Yes  Problem Relation Age of Onset  . Uterine cancer Maternal Grandmother  Social History   Tobacco Use  . Smoking status: Former Smoker    Packs/day: 0.50    Years: 7.00    Pack years: 3.50    Types: Cigarettes    Quit date: 03/04/2013    Years since quitting: 6.0  . Smokeless tobacco: Never Used  . Tobacco comment: does some vape  Substance Use Topics  . Alcohol use: Not Currently    Alcohol/week: 0.0 standard drinks    Comment: 9 a week  . Drug use: No    Home Medications Prior to Admission medications   Medication Sig Start Date End Date Taking? Authorizing Provider  acetaminophen (TYLENOL) 500 MG tablet Take 1,000 mg by mouth every 6 (six) hours as needed for mild pain, moderate pain or  headache.    Yes [provider]  albuterol (PROVENTIL HFA;VENTOLIN HFA) 108 (90 BASE) MCG/ACT inhaler Inhale 2 puffs into the lungs every 6 (six) hours as needed for wheezing.   Yes [provider]  busPIRone (BUSPAR) 5 MG tablet Take 5 mg by mouth 2 (two) times daily.   Yes Emergency, Nurse, RN  cetirizine (ZYRTEC) 10 MG tablet Take 10 mg by mouth at bedtime.    Yes [provider]  ibuprofen (ADVIL) 200 MG tablet Take 800 mg by mouth every 6 (six) hours as needed for mild pain or moderate pain.   Yes [provider]  levothyroxine (SYNTHROID, LEVOTHROID) 50 MCG tablet Take 50 mcg by mouth every other day.  10/05/15  Yes [provider]  levothyroxine (SYNTHROID, LEVOTHROID) 75 MCG tablet Take 75 mcg by mouth every other day.    Yes [provider]  sertraline (ZOLOFT) 100 MG tablet Take 100 mg by mouth at bedtime.   Yes [provider]  ibuprofen (ADVIL,MOTRIN) 800 MG tablet Take 1 tablet (800 mg total) by mouth every 8 (eight) hours. Patient not taking: Reported on 03/23/2019 05/27/18   Karena Addison, CNM  ipratropium (ATROVENT) 0.06 % nasal spray Place 2 sprays into both nostrils 4 (four) times daily. Patient not taking: Reported on 03/23/2019 11/26/18   Belinda Fisher, PA-C  lidocaine (XYLOCAINE) 2 % solution 5-15 mL to gurgle as needed. Patient not taking: Reported on 03/23/2019 11/26/18   Belinda Fisher, PA-C    Allergies    Patient has no known allergies.  Review of Systems   Review of Systems  Constitutional: Positive for chills and fever.  HENT: Negative for congestion and sore throat.   Respiratory: Positive for chest tightness and shortness of breath. Negative for cough and wheezing.   Cardiovascular: Negative for chest pain.  Gastrointestinal: Positive for diarrhea and nausea. Negative for abdominal pain and vomiting.  Genitourinary: Negative for dysuria and hematuria.  Musculoskeletal: Positive for neck pain.  Neurological:  Positive for headaches.  All other systems reviewed and are negative.   Physical Exam Updated Vital Signs BP 102/71   Pulse (!) 118   Temp 99.6 F (37.6 C) (Oral)   Resp (!) 24   Ht 5\' 4"  (1.626 m)   Wt 78 kg   SpO2 100%   BMI 29.52 kg/m   Physical Exam Vitals and nursing note reviewed.  Constitutional:      General: She is not in acute distress.    Appearance: She is ill-appearing.  HENT:     Head: Normocephalic.  Eyes:     Pupils: Pupils are equal, round, and reactive to light.  Neck:     Comments: No meningismus. Full ROM  of neck Cardiovascular:     Rate and Rhythm: Regular rhythm. Tachycardia present.     Pulses: Normal pulses.     Heart sounds: Normal heart sounds. No murmur. No friction rub. No gallop.   Pulmonary:     Effort: Pulmonary effort is normal.     Breath sounds: Normal breath sounds.     Comments: Respirations equal and unlabored, patient able to speak in full sentences, lungs clear to auscultation bilaterally Abdominal:     General: Abdomen is flat. Bowel sounds are normal. There is no distension.     Palpations: Abdomen is soft.     Tenderness: There is no abdominal tenderness. There is no right CVA tenderness, left CVA tenderness, guarding or rebound.     Comments: Abdomen soft, nondistended, nontender to palpation in all quadrants without guarding or peritoneal signs. No rebound.   Musculoskeletal:     Cervical back: Neck supple.     Right lower leg: No edema.     Left lower leg: No edema.     Comments: Able to move all 4 extremities without difficulty. No lower extremity edema.   Skin:    General: Skin is warm and dry.  Neurological:     General: No focal deficit present.     Mental Status: She is alert.     Comments: Speech is clear, able to follow commands CN III-XII intact Normal strength in upper and lower extremities bilaterally including dorsiflexion and plantar flexion, strong and equal grip strength Sensation grossly intact  throughout Moves extremities without ataxia, coordination intact No pronator drift      ED Results / Procedures / Treatments   Labs (all labs ordered are listed, but only abnormal results are displayed) Labs Reviewed  CBC WITH DIFFERENTIAL/PLATELET - Abnormal; Notable for the following components:      Result Value   WBC 16.6 (*)    Neutro Abs 14.6 (*)    All other components within normal limits  COMPREHENSIVE METABOLIC PANEL - Abnormal; Notable for the following components:   Sodium 134 (*)    Potassium 3.4 (*)    CO2 21 (*)    Glucose, Bld 126 (*)    Calcium 8.8 (*)    All other components within normal limits  URINALYSIS, ROUTINE W REFLEX MICROSCOPIC - Abnormal; Notable for the following components:   APPearance HAZY (*)    Hgb urine dipstick SMALL (*)    Ketones, ur 5 (*)    Bacteria, UA RARE (*)    All other components within normal limits  CSF CELL COUNT WITH DIFFERENTIAL - Abnormal; Notable for the following components:   Color, CSF RED (*)    Appearance, CSF CLOUDY (*)    RBC Count, CSF 34,875 (*)    WBC, CSF 140 (*)    Segmented Neutrophils-CSF 96 (*)    Lymphs, CSF 1 (*)    Monocyte-Macrophage-Spinal Fluid 3 (*)    All other components within normal limits  GLUCOSE, CSF - Abnormal; Notable for the following components:   Glucose, CSF 73 (*)    All other components within normal limits  PROTEIN, CSF - Abnormal; Notable for the following components:   Total  Protein, CSF 47 (*)    All other components within normal limits  CSF CULTURE  RESPIRATORY PANEL BY RT PCR (FLU A&B, COVID)  NOVEL CORONAVIRUS, NAA (HOSP ORDER, SEND-OUT TO REF LAB; TAT 18-24 HRS)  CULTURE, BLOOD (ROUTINE X 2)  CULTURE, BLOOD (ROUTINE X 2)  URINE CULTURE  LACTIC ACID, PLASMA  INFLUENZA PANEL BY PCR (TYPE A & B)  APTT  PROTIME-INR  LACTIC ACID, PLASMA  MONONUCLEOSIS SCREEN  PREGNANCY, URINE  LACTIC ACID, PLASMA  CSF CELL COUNT WITH DIFFERENTIAL  HIV ANTIBODY (ROUTINE TESTING W  REFLEX)  CBC  CREATININE, SERUM  MAGNESIUM  PHOSPHORUS  TSH  BASIC METABOLIC PANEL  CBC  MAGNESIUM  POC SARS CORONAVIRUS 2 AG -  ED  I-STAT BETA HCG BLOOD, ED ([facility], [facility], [facility] ONLY)    EKG EKG Interpretation  Date/Time:  Tuesday March 23 2019 16:06:57 EST Ventricular Rate:  115 PR Interval:    QRS Duration: 80 QT Interval:  311 QTC Calculation: 431 R Axis:   62 Text Interpretation: Sinus tachycardia Low voltage, precordial leads Baseline wander in lead(s) V4 Rate faster Confirmed by Ezequiel Essex [phone]) on 03/23/2019 4:11:03 PM   Radiology CT Head Wo Contrast  Result Date: 03/23/2019 CLINICAL DATA:  Headache. EXAM: CT HEAD WITHOUT CONTRAST TECHNIQUE: Contiguous axial images were obtained from the base of the skull through the vertex without intravenous contrast. COMPARISON:  October 11, 2015 FINDINGS: Brain: No evidence of acute infarction, hemorrhage, hydrocephalus, extra-axial collection or mass lesion/mass effect. Vascular: No hyperdense vessel or unexpected calcification. Skull: Normal. Negative for fracture or focal lesion. Sinuses/Orbits: No acute finding. Other: The metallic density foreign body seen within the subcutaneous tissues adjacent to the anterior aspect of the frontal sinus on the left on the prior study is no longer present. IMPRESSION: No acute intracranial pathology. Electronically Signed   By: Virgina Norfolk M.D.   On: 03/23/2019 17:35   DG Chest Portable 1 View  Result Date: 03/23/2019 CLINICAL DATA:  Fever and short of breath EXAM: PORTABLE CHEST 1 VIEW COMPARISON:  09/12/2012 FINDINGS: The heart size and mediastinal contours are within normal limits. Both lungs are clear. The visualized skeletal structures are unremarkable. IMPRESSION: No active disease. Electronically Signed   By: Franchot Gallo M.D.   On: 03/23/2019 14:06    Procedures .Critical Care Performed by: Suzy Bouchard, PA-C Authorized by: Suzy Bouchard, PA-C   Critical care  provider statement:    Critical care time (minutes):  45   Critical care time was exclusive of:  Separately billable procedures and treating other patients and teaching time   Critical care was necessary to treat or prevent imminent or life-threatening deterioration of the following conditions:  Sepsis and CNS failure or compromise   Critical care was time spent personally by me on the following activities:  Development of treatment plan with patient or surrogate, discussions with consultants, evaluation of patient's response to treatment, examination of patient, obtaining history from patient or surrogate, review of old charts, re-evaluation of patient's condition, pulse oximetry, ordering and review of radiographic studies, ordering and review of laboratory studies, ordering and performing treatments and interventions and blood draw for specimens   I assumed direction of critical care for this patient from another provider in my specialty: yes     (including critical care time)  Medications Ordered in ED Medications  vancomycin (VANCOCIN) IVPB 1000 mg/200 mL premix (has no administration in time range)  busPIRone (BUSPAR) tablet 5 mg (has no administration in time range)  sertraline (ZOLOFT) tablet 100 mg (has no administration in time range)  levothyroxine (SYNTHROID) tablet 50 mcg (has no administration in time range)  levothyroxine (SYNTHROID) tablet 75 mcg (has no administration in time range)  albuterol (VENTOLIN HFA) 108 (90 Base) MCG/ACT inhaler 2 puff (has no administration in time  range)  loratadine (CLARITIN) tablet 10 mg (has no administration in time range)  enoxaparin (LOVENOX) injection 40 mg (has no administration in time range)  cefTRIAXone (ROCEPHIN) 2 g in sodium chloride 0.9 % 100 mL IVPB (has no administration in time range)  acyclovir (ZOVIRAX) 780 mg in dextrose 5 % 150 mL IVPB (has no administration in time range)  0.9 % NaCl with KCl 20 mEq/ L  infusion (has no  administration in time range)  acetaminophen (TYLENOL) tablet 650 mg (650 mg Oral Given 03/23/19 1256)  sodium chloride 0.9 % bolus 1,000 mL (0 mLs Intravenous Stopped 03/23/19 1714)  sodium chloride 0.9 % bolus 1,000 mL (0 mLs Intravenous Stopped 03/23/19 1818)  dexamethasone (DECADRON) injection 10 mg (10 mg Intravenous Given 03/23/19 1744)  cefTRIAXone (ROCEPHIN) 2 g in sodium chloride 0.9 % 100 mL IVPB (0 g Intravenous Stopped 03/23/19 1818)  ondansetron (ZOFRAN) injection 4 mg (4 mg Intravenous Given 03/23/19 1744)  vancomycin (VANCOREADY) IVPB 1750 mg/350 mL (0 mg Intravenous Stopped 03/23/19 2031)    ED Course  I have reviewed the triage vital signs and the nursing notes.  Pertinent labs & imaging results that were available during my care of the patient were reviewed by me and considered in my medical decision making (see chart for details).  Clinical Course as of Mar 22 2034  Tue Mar 23, 2019  1536 Given her elevated WBC and negative COVID test will call code sepsis.   WBC(!): 16.6 [CA]  1723 Spoke to CT tech to push patient to the top of the list for scan so we can do the lumbar puncture.    [CA]  [id number] Spoke with Dr. Dartha Lodge who agrees to evaluate patient and admit for further treatment. He recommends a consult to ID to ensure treatment is correct.    [CA]  1837 Spoke to Dr. Luciana Axe who recommends holding acyclovir given patient does not display signs of encephalitis.   [CA]    Clinical Course User Index [CA] Mannie Stabile, PA-C   MDM Rules/Calculators/A&P                     39 year old female presents to the ED due to headache, fever, myalgias, and shortness of breath x3 days.  Patient denies known Covid exposures and sick contacts.  Patient is febrile at 103.2, tachycardic at 134, but otherwise reassuring vitals.  Patient in no acute distress, but ill-appearing.  Physical exam reassuring with no meningeal signs.  Full range of motion of neck.  Abdomen soft, nondistended,  nontender.  Normal neurological exam.  Suspect symptoms are related to Covid vs. Influenza vs. possible meningitis.  Will obtain UA, routine labs, rapid Covid test, and lactic acid. Will give 1L fluids and not more due to unknown COVID status.   Rapid Covid test negative.  Influenza test negative.  UA significant for small amounts of hematuria and ketonuria, but no signs of infection.  CBC significant for leukocytosis at 16.6, but otherwise normal.  Normal lactic acid at 0.8.  CMP significant for hyponatremia at 134, hypokalemia 3.4, and normal renal function. Given her leukocytosis and recent vitals, sepsis order set ordered. Will start patient on antibiotics for possible meningitis. Will give 1 more liter of fluids. Discussed case with oncoming attending, Dr. Manus Gunning who assessment patient at bedside. CT head obtained to rule out mass effect prior to lumbar puncture.   Dr. Manus Gunning performed LP. Cultures sent to lab. Discussed case with Dr. Dartha Lodge who agrees to  admit patient for further treatment.     Final Clinical Impression(s) / ED Diagnoses Final diagnoses:  Fever of unknown origin    Rx / DC Orders ED Discharge Orders    None       Mannie Stabileberman,  C, PA-C 03/23/19 2037  Edited on 03/23/19 at 8:57 to add CC time. [address] , PA-C   Jesusita Okaberman,  C, PA-C 03/23/19 2057    Glynn Octaveancour, Stephen, MD 03/23/19 2200

## 2019-03-23 NOTE — ED Notes (Signed)
Report given to Jospephine, RN on 6N. All questions answered

## 2019-03-23 NOTE — ED Notes (Signed)
Pt taken to CT.

## 2019-03-23 NOTE — ED Notes (Signed)
Patient transported to CT 

## 2019-03-23 NOTE — ED Notes (Signed)
Dr. Dartha Lodge informed of critical WBC count in CSF of 150 face to face. Dr. Dartha Lodge at bedside

## 2019-03-23 NOTE — Progress Notes (Signed)
Pharmacy Antibiotic Note  Megan Lara is a 39 y.o. female admitted on 03/23/2019 with meningitis. Patient presented with a headache, neck pain, weakness, myalgias, and fever x 1 day. WBC is elevated at 16.6  Pharmacy has been consulted for vancomycin dosing.  Plan: Vancomycin 1750 mg x1 dose; then start vancomycin 1000 mg IV q12hr  - Goal AUC 400-550.  - Expected AUC: 463.7  - SCr used: 0.77 Monitor clinical improvement, cultures, and obtain levels as needed    Height: 5\' 4"  (162.6 cm) Weight: 171 lb 15.3 oz (78 kg) IBW/kg (Calculated) : 54.7  Temp (24hrs), Avg:102.6 F (39.2 C), Min:102 F (38.9 C), Max:103.2 F (39.6 C)  Recent Labs  Lab 03/23/19 1437  WBC 16.6*  CREATININE 0.77  LATICACIDVEN 0.8    Estimated Creatinine Clearance: 98.2 mL/min (by C-G formula based on SCr of 0.77 mg/dL).    No Known Allergies  Antimicrobials this admission: 1/19 vancomycin >>  1/19 ceftriaxone >>   Dose adjustments this admission:  Microbiology results: 1/19 BCx:  1/19 UCx:  1/19 Sputum:    Thank you for allowing pharmacy to be a part of this patient's care.  2/19 03/23/2019 5:28 PM

## 2019-03-23 NOTE — Progress Notes (Signed)
Megan Lara is a 39 y.o. female patient admitted from ED awake, alert - oriented  X 4 - no acute distress noted.  VSS - Blood pressure 113/60, pulse 100, temperature 98.4 F (36.9 C), temperature source Oral, resp. rate 20, height 5\' 4"  (1.626 m), weight 78 kg, SpO2 99 %, unknown if currently breastfeeding.    IV in place, occlusive dsg intact without redness.  Will cont to eval and treat per MD orders.  , RN 03/23/2019 11:22 PM

## 2019-03-24 DIAGNOSIS — E872 Acidosis: Secondary | ICD-10-CM

## 2019-03-24 DIAGNOSIS — F329 Major depressive disorder, single episode, unspecified: Secondary | ICD-10-CM

## 2019-03-24 DIAGNOSIS — F419 Anxiety disorder, unspecified: Secondary | ICD-10-CM

## 2019-03-24 DIAGNOSIS — E039 Hypothyroidism, unspecified: Secondary | ICD-10-CM

## 2019-03-24 LAB — BASIC METABOLIC PANEL
Anion gap: 11 (ref 5–15)
BUN: 6 mg/dL (ref 6–20)
CO2: 18 mmol/L — ABNORMAL LOW (ref 22–32)
Calcium: 7.8 mg/dL — ABNORMAL LOW (ref 8.9–10.3)
Chloride: 110 mmol/L (ref 98–111)
Creatinine, Ser: 0.65 mg/dL (ref 0.44–1.00)
GFR calc Af Amer: >60 mL/min (ref >60)
GFR calc non Af Amer: >60 mL/min (ref >60)
Glucose, Bld: 182 mg/dL — ABNORMAL HIGH (ref 70–99)
Potassium: 3.4 mmol/L — ABNORMAL LOW (ref 3.5–5.1)
Sodium: 139 mmol/L (ref 135–145)

## 2019-03-24 LAB — CBC
HCT: 37.2 % (ref 36.0–46.0)
Hemoglobin: 12.1 g/dL (ref 12.0–15.0)
MCH: 31.3 pg (ref 26.0–34.0)
MCHC: 32.5 g/dL (ref 30.0–36.0)
MCV: 96.4 fL (ref 80.0–100.0)
Platelets: 255 10*3/uL (ref 150–400)
RBC: 3.86 MIL/uL — ABNORMAL LOW (ref 3.87–5.11)
RDW: 13.1 % (ref 11.5–15.5)
WBC: 18.5 10*3/uL — ABNORMAL HIGH (ref 4.0–10.5)
nRBC: 0 % (ref 0.0–0.2)

## 2019-03-24 LAB — URINE CULTURE: Culture: NO GROWTH

## 2019-03-24 LAB — NOVEL CORONAVIRUS, NAA (HOSP ORDER, SEND-OUT TO REF LAB; TAT 18-24 HRS): SARS-CoV-2, NAA: NOT DETECTED

## 2019-03-24 LAB — LACTIC ACID, PLASMA: Lactic Acid, Venous: 0.6 mmol/L (ref 0.5–1.9)

## 2019-03-24 LAB — HIV ANTIBODY (ROUTINE TESTING W REFLEX): HIV Screen 4th Generation wRfx: NONREACTIVE

## 2019-03-24 LAB — PATHOLOGIST SMEAR REVIEW

## 2019-03-24 MED ORDER — ACETAMINOPHEN 325 MG PO TABS
650.0000 mg | ORAL_TABLET | Freq: Four times a day (QID) | ORAL | Status: DC | PRN
  Administered 2019-03-24 – 2019-03-27 (×7): 650 mg via ORAL
  Filled 2019-03-24 (×8): qty 2

## 2019-03-24 MED ORDER — VANCOMYCIN HCL 750 MG/150ML IV SOLN
750.0000 mg | Freq: Three times a day (TID) | INTRAVENOUS | Status: DC
  Administered 2019-03-24 – 2019-03-27 (×9): 750 mg via INTRAVENOUS
  Filled 2019-03-24 (×11): qty 150

## 2019-03-24 MED ORDER — LIDOCAINE 5 % EX PTCH
1.0000 | MEDICATED_PATCH | CUTANEOUS | Status: DC
  Administered 2019-03-24 – 2019-03-27 (×2): 1 via TRANSDERMAL
  Filled 2019-03-24 (×4): qty 1

## 2019-03-24 NOTE — Progress Notes (Signed)
[name] Kitchen.  PROGRESS NOTE    Megan Lara  ZOX:[id number] DOB: 21-Jun-1980 DOA: 03/23/2019 PCP: Mila PalmerWolters, Sharon, MD   Brief Narrative:    Patient presents with 1 day history of fever, headache, neck pain, nausea and vomiting, myalgia, weakness, nausea and vomiting.  There is associated photophobia.  No chest pain, no shortness of breath, no URI symptoms and no urinary symptoms reported.  On presentation to the hospital, T-max was 103.2, with significant tachycardia that peaked at 134 bpm, leukocytosis of 16.6.  03/24/19: Feeling a little better this AM. Reports still needing APAP for HA (last dose around 0600 this AM), but overall improvement. Cx all NgTD. Temperature curve down (but hard to know if this is d/t APAP). At this point, continue abx and antivirals. WBCs up, but did have steroids yesterday. ROM of neck and pain w/ neck is improved. Photophobia is improved. She said this all started with diarrhea and she reports some last night. Probably prudent to order GI panel, but no recent abx use and her description of her diarrhea doesn't quite fit c. diff, so would hold on the CD test. Otherwise, continue current Tx.    Assessment & Plan:   Active Problems:   Meningitis  Meningitis Sepsis     - at admission: temp 103.2, HR 134, WBC 16.6; she has also been hypotensive; source presumed menegitis at this time     - per previous physician, ED contacted ID; right now I only see neurosurgery contacted     - s/p LP     - Bld Cx, UCx, CSF Cx all negative to day     - continue IV vancomycin, rocephin, acyclovir     - pt is s/p dose of IV decadron     - temp curve is improving     - CXR is negative  Hyponatremia Hypokalemia     - IVF     - add K+     - Mg2+ is ok  Volume depletion Metabolic acidosis     - IVF  Hypothyroidism     - on levothyroxine at home; continue here  Anxiety/depression     - home regimen: buspar, zoloft     - buspar has been resumed  DVT prophylaxis:  lovenox Code Status: FULL Family Communication: None at bedside   Disposition Plan: Temp curve improving. Needs more work. See above.  Antimicrobials:  . Rocephin, vancomycin, acyclovir   ROS:  Reports improved neck pain, photophobia. Reports HA. Denis CP, dyspnea, N, V . Remainder 10-pt ROS is negative for all not previously mentioned.  Subjective: "It's a little better."  Objective: Vitals:   03/23/19 2200 03/23/19 2215 03/23/19 2233 03/24/19 0447  BP: 107/66 107/72 113/60 (!) 90/57  Pulse: 95 100 100 73  Resp: (!) 21 (!) 21 20 18   Temp:   98.4 F (36.9 C) 97.8 F (36.6 C)  TempSrc:   Oral Oral  SpO2: 98% 96% 99% 99%  Weight:      Height:        Intake/Output Summary (Last 24 hours) at 03/24/2019 0841 Last data filed at 03/24/2019 0827 Gross per 24 hour  Intake 2967.95 ml  Output 1200 ml  Net 1767.95 ml   Filed Weights   03/23/19 1702  Weight: 78 kg    Examination:  General: 39 y.o. female resting in bed in NAD Cardiovascular: RRR, +S1, S2, no m/g/r, equal pulses throughout Respiratory: CTABL, no w/r/r, normal WOB GI: BS+, ND, TTP mild to LLQ, no  masses noted, no organomegaly noted MSK: No e/c/c Skin: No rashes, bruises, ulcerations noted Neuro: A&O x 3, no focal deficits; full ROM of neck, no TTP to C-spine Psyc: Appropriate interaction and affect, calm/cooperative   Data Reviewed: I have personally reviewed following labs and imaging studies.  CBC: Recent Labs  Lab 03/23/19 1437 03/23/19 2130 03/24/19 0105  WBC 16.6* 17.9* 18.5*  NEUTROABS 14.6*  --   --   HGB 12.5 12.1 12.1  HCT 37.8 38.2 37.2  MCV 95.5 100.3* 96.4  PLT 276 298 132   Basic Metabolic Panel: Recent Labs  Lab 03/23/19 1437 03/23/19 2130 03/24/19 0105  NA 134*  --  139  K 3.4*  --  3.4*  CL 102  --  110  CO2 21*  --  18*  GLUCOSE 126*  --  182*  BUN 7  --  6  CREATININE 0.77 0.69 0.65  CALCIUM 8.8*  --  7.8*  MG  --  1.8  --   PHOS  --  2.9  --    GFR: Estimated  Creatinine Clearance: 98.2 mL/min (by C-G formula based on SCr of 0.65 mg/dL). Liver Function Tests: Recent Labs  Lab 03/23/19 1437  AST 19  ALT 15  ALKPHOS 52  BILITOT 1.2  PROT 6.5  ALBUMIN 3.6   No results for input(s): LIPASE, AMYLASE in the last 168 hours. No results for input(s): AMMONIA in the last 168 hours. Coagulation Profile: Recent Labs  Lab 03/23/19 1702  INR 1.1   Cardiac Enzymes: No results for input(s): CKTOTAL, CKMB, CKMBINDEX, TROPONINI in the last 168 hours. BNP (last 3 results) No results for input(s): PROBNP in the last 8760 hours. HbA1C: No results for input(s): HGBA1C in the last 72 hours. CBG: No results for input(s): GLUCAP in the last 168 hours. Lipid Profile: No results for input(s): CHOL, HDL, LDLCALC, TRIG, CHOLHDL, LDLDIRECT in the last 72 hours. Thyroid Function Tests: Recent Labs    03/23/19 2130  TSH 0.851   Anemia Panel: No results for input(s): VITAMINB12, FOLATE, FERRITIN, TIBC, IRON, RETICCTPCT in the last 72 hours. Sepsis Labs: Recent Labs  Lab 03/23/19 1437 03/23/19 1702 03/24/19 0105  LATICACIDVEN 0.8 1.9 0.6    Recent Results (from the past 240 hour(s))  Blood Culture (routine x 2)     Status: None (Preliminary result)   Collection Time: 03/23/19  5:02 PM   Specimen: BLOOD  Result Value Ref Range Status   Specimen Description BLOOD RIGHT ANTECUBITAL  Final   Special Requests   Final    BOTTLES DRAWN AEROBIC ONLY Blood Culture results may not be optimal due to an inadequate volume of blood received in culture bottles   Culture   Final    NO GROWTH < 24 HOURS Performed at [facility] [address] [place], [place] [address]    Report Status PENDING  Incomplete  Respiratory Panel by RT PCR (Flu A&B, Covid) - Nasopharyngeal Swab     Status: None   Collection Time: 03/23/19  5:43 PM   Specimen: Nasopharyngeal Swab  Result Value Ref Range Status   SARS Coronavirus 2 by RT PCR NEGATIVE NEGATIVE Final     Comment: (NOTE) SARS-CoV-2 target nucleic acids are NOT DETECTED. The SARS-CoV-2 RNA is generally detectable in upper respiratoy specimens during the acute phase of infection. The lowest concentration of SARS-CoV-2 viral copies this assay can detect is 131 copies/mL. A negative result does not preclude SARS-Cov-2 infection and should not be  used as the sole basis for treatment or other patient management decisions. A negative result may occur with  improper specimen collection/handling, submission of specimen other than nasopharyngeal swab, presence of viral mutation(s) within the areas targeted by this assay, and inadequate number of viral copies (<131 copies/mL). A negative result must be combined with clinical observations, patient history, and epidemiological information. The expected result is Negative. Fact Sheet for Patients:  https://www.moore.com/ Fact Sheet for Healthcare Providers:  https://www.young.biz/ This test is not yet ap proved or cleared by the [place] FDA and  has been authorized for detection and/or diagnosis of SARS-CoV-2 by FDA under an Emergency Use Authorization (EUA). This EUA will remain  in effect (meaning this test can be used) for the duration of the COVID-19 declaration under Section 564(b)(1) of the Act, 21 U.S.C. section 360bbb-3(b)(1), unless the authorization is terminated or revoked sooner.    Influenza A by PCR NEGATIVE NEGATIVE Final   Influenza B by PCR NEGATIVE NEGATIVE Final    Comment: (NOTE) The Xpert Xpress SARS-CoV-2/FLU/RSV assay is intended as an aid in  the diagnosis of influenza from Nasopharyngeal swab specimens and  should not be used as a sole basis for treatment. Nasal washings and  aspirates are unacceptable for Xpert Xpress SARS-CoV-2/FLU/RSV  testing. Fact Sheet for Patients: https://www.moore.com/ Fact Sheet for Healthcare  Providers: https://www.young.biz/ This test is not yet approved or cleared by the [place] FDA and  has been authorized for detection and/or diagnosis of SARS-CoV-2 by  FDA under an Emergency Use Authorization (EUA). This EUA will remain  in effect (meaning this test can be used) for the duration of the  Covid-19 declaration under Section 564(b)(1) of the Act, 21  U.S.C. section 360bbb-3(b)(1), unless the authorization is  terminated or revoked. Performed at [facility], [address] [place], [place] [address]   CSF culture with Stat gram stain     Status: None (Preliminary result)   Collection Time: 03/23/19  6:06 PM   Specimen: CSF; Cerebrospinal Fluid  Result Value Ref Range Status   Specimen Description CSF  Final   Special Requests NONE  Final   Gram Stain   Final    WBC PRESENT, PREDOMINANTLY PMN NO ORGANISMS SEEN CYTOSPIN SMEAR    Culture   Final    NO GROWTH < 24 HOURS Performed at [facility], [address], [place], [place] [address]    Report Status PENDING  Incomplete      Radiology Studies: CT Angio Head W or Wo Contrast  Result Date: 03/23/2019 CLINICAL DATA:  Fever and headache. EXAM: CT ANGIOGRAPHY HEAD AND NECK TECHNIQUE: Multidetector CT imaging of the head and neck was performed using the standard protocol during bolus administration of intravenous contrast. Multiplanar CT image reconstructions and MIPs were obtained to evaluate the vascular anatomy. Carotid stenosis measurements (when applicable) are obtained utilizing NASCET criteria, using the distal internal carotid diameter as the denominator. CONTRAST:  71mL OMNIPAQUE IOHEXOL 350 MG/ML SOLN COMPARISON:  CT head 03/23/2019 FINDINGS: CTA NECK FINDINGS Aortic arch: Standard branching. Imaged portion shows no evidence of aneurysm or dissection. No significant stenosis of the major arch vessel origins. Right carotid system: Normal right carotid. Negative for stenosis or  dissection. No significant atherosclerotic disease. Left carotid system: Normal left carotid. Negative for stenosis or dissection. No significant atherosclerotic disease. Vertebral arteries: Normal vertebral arteries bilaterally Skeleton: Negative Other neck: Negative Upper chest: Lung apices clear bilaterally. Review of the MIP images confirms the  above findings CTA HEAD FINDINGS Anterior circulation: Cavernous carotid normal bilaterally. Anterior and middle cerebral arteries patent bilaterally without stenosis. Negative for aneurysm. Posterior circulation: Both vertebral arteries patent to the basilar. PICA patent bilaterally. Basilar widely patent. Superior cerebellar and posterior cerebral arteries patent bilaterally. Fetal origin right posterior cerebral artery. Negative for aneurysm. Venous sinuses: Negative Anatomic variants: None Review of the MIP images confirms the above findings IMPRESSION: Negative CTA head and neck. No significant stenosis or large vessel occlusion. Negative for aneurysm or dissection. Electronically Signed   By: Marlan Palau M.D.   On: 03/23/2019 21:16   CT Head Wo Contrast  Result Date: 03/23/2019 CLINICAL DATA:  Headache. EXAM: CT HEAD WITHOUT CONTRAST TECHNIQUE: Contiguous axial images were obtained from the base of the skull through the vertex without intravenous contrast. COMPARISON:  October 11, 2015 FINDINGS: Brain: No evidence of acute infarction, hemorrhage, hydrocephalus, extra-axial collection or mass lesion/mass effect. Vascular: No hyperdense vessel or unexpected calcification. Skull: Normal. Negative for fracture or focal lesion. Sinuses/Orbits: No acute finding. Other: The metallic density foreign body seen within the subcutaneous tissues adjacent to the anterior aspect of the frontal sinus on the left on the prior study is no longer present. IMPRESSION: No acute intracranial pathology. Electronically Signed   By: Aram Candela M.D.   On: 03/23/2019 17:35   CT  Angio Neck W and/or Wo Contrast  Result Date: 03/23/2019 CLINICAL DATA:  Fever and headache. EXAM: CT ANGIOGRAPHY HEAD AND NECK TECHNIQUE: Multidetector CT imaging of the head and neck was performed using the standard protocol during bolus administration of intravenous contrast. Multiplanar CT image reconstructions and MIPs were obtained to evaluate the vascular anatomy. Carotid stenosis measurements (when applicable) are obtained utilizing NASCET criteria, using the distal internal carotid diameter as the denominator. CONTRAST:  5mL OMNIPAQUE IOHEXOL 350 MG/ML SOLN COMPARISON:  CT head 03/23/2019 FINDINGS: CTA NECK FINDINGS Aortic arch: Standard branching. Imaged portion shows no evidence of aneurysm or dissection. No significant stenosis of the major arch vessel origins. Right carotid system: Normal right carotid. Negative for stenosis or dissection. No significant atherosclerotic disease. Left carotid system: Normal left carotid. Negative for stenosis or dissection. No significant atherosclerotic disease. Vertebral arteries: Normal vertebral arteries bilaterally Skeleton: Negative Other neck: Negative Upper chest: Lung apices clear bilaterally. Review of the MIP images confirms the above findings CTA HEAD FINDINGS Anterior circulation: Cavernous carotid normal bilaterally. Anterior and middle cerebral arteries patent bilaterally without stenosis. Negative for aneurysm. Posterior circulation: Both vertebral arteries patent to the basilar. PICA patent bilaterally. Basilar widely patent. Superior cerebellar and posterior cerebral arteries patent bilaterally. Fetal origin right posterior cerebral artery. Negative for aneurysm. Venous sinuses: Negative Anatomic variants: None Review of the MIP images confirms the above findings IMPRESSION: Negative CTA head and neck. No significant stenosis or large vessel occlusion. Negative for aneurysm or dissection. Electronically Signed   By: Marlan Palau M.D.   On:  03/23/2019 21:16   DG Chest Portable 1 View  Result Date: 03/23/2019 CLINICAL DATA:  Fever and short of breath EXAM: PORTABLE CHEST 1 VIEW COMPARISON:  09/12/2012 FINDINGS: The heart size and mediastinal contours are within normal limits. Both lungs are clear. The visualized skeletal structures are unremarkable. IMPRESSION: No active disease. Electronically Signed   By: Marlan Palau M.D.   On: 03/23/2019 14:06     Scheduled Meds: . busPIRone  5 mg Oral BID  . enoxaparin (LOVENOX) injection  40 mg Subcutaneous Q24H  . [START ON 03/25/2019] levothyroxine  50  mcg Oral QODAY  . levothyroxine  75 mcg Oral QODAY  . loratadine  10 mg Oral Daily  . sertraline  100 mg Oral QHS   Continuous Infusions: . 0.9 % NaCl with KCl 20 mEq / L 75 mL/hr at 03/23/19 2153  . acyclovir 780 mg (03/24/19 0547)  . cefTRIAXone (ROCEPHIN)  IV 2 g (03/24/19 0453)  . vancomycin 1,000 mg (03/24/19 0457)     LOS: 1 day    Time spent: 25 minutes spent in the coordination of care today.    Teddy Spike, DO [facility]  If 7PM-7AM, please contact night-coverage [email or web address] 03/24/2019, 8:41 AM

## 2019-03-24 NOTE — Progress Notes (Addendum)
Pharmacy Antibiotic Note  Megan Lara is a 39 y.o. female admitted on 03/23/2019 with r/o meningitis. Patient presented with a headache, neck pain, weakness, myalgias, and fever x 1 day. Pharmacy has been consulted for vancomycin dosing. Also on acyclovir and ceftriaxone. S/p LP on 1/19. SCr stable at 0.65.  Plan: Increase vancomycin to 750mg  IV q8h for meningitis dosing Ceftriaxone 2g IV q12h Acyclovir 780mg  (10mg /kg TBW) IV q8h Monitor clinical progress, c/s, renal function, HSV PCR F/u de-escalation plan/LOT, vancomycin trough at steady state    Height: 5\' 4"  (162.6 cm) Weight: 171 lb 15.3 oz (78 kg) IBW/kg (Calculated) : 54.7  Temp (24hrs), Avg:100.7 F (38.2 C), Min:97.8 F (36.6 C), Max:103.2 F (39.6 C)  Recent Labs  Lab 03/23/19 1437 03/23/19 1702 03/23/19 2130 03/24/19 0105  WBC 16.6*  --  17.9* 18.5*  CREATININE 0.77  --  0.69 0.65  LATICACIDVEN 0.8 1.9  --  0.6    Estimated Creatinine Clearance: 98.2 mL/min (by C-G formula based on SCr of 0.65 mg/dL).    No Known Allergies  Antimicrobials this admission: 1/19 vancomycin >>  1/19 ceftriaxone >>  1/19 acyclovir >>  Microbiology results: 1/19 CSF cx- ngtd 1/19 CSF cell ct - neut 85, lymp 7, wbc 46, prot 47, gluc 73 1/19 Bcx: ngtd 1/19 Ucx: 1/19 flu/covid - neg; 1/19 repeat covid - pending 1/20 GI panel -  1/20 HSV pcr -    2/19, PharmD, BCPS Please check AMION for all [facility] contact numbers Clinical Pharmacist 03/24/2019 9:38 AM

## 2019-03-25 LAB — HSV DNA BY PCR (REFERENCE LAB)
HSV 1 DNA: NEGATIVE
HSV 2 DNA: NEGATIVE

## 2019-03-25 LAB — COMPREHENSIVE METABOLIC PANEL
ALT: 15 U/L (ref 0–44)
AST: 12 U/L — ABNORMAL LOW (ref 15–41)
Albumin: 2.8 g/dL — ABNORMAL LOW (ref 3.5–5.0)
Alkaline Phosphatase: 38 U/L (ref 38–126)
Anion gap: 9 (ref 5–15)
BUN: 8 mg/dL (ref 6–20)
CO2: 20 mmol/L — ABNORMAL LOW (ref 22–32)
Calcium: 7.9 mg/dL — ABNORMAL LOW (ref 8.9–10.3)
Chloride: 113 mmol/L — ABNORMAL HIGH (ref 98–111)
Creatinine, Ser: 0.63 mg/dL (ref 0.44–1.00)
GFR calc Af Amer: >60 mL/min (ref >60)
GFR calc non Af Amer: >60 mL/min (ref >60)
Glucose, Bld: 127 mg/dL — ABNORMAL HIGH (ref 70–99)
Potassium: 3.4 mmol/L — ABNORMAL LOW (ref 3.5–5.1)
Sodium: 142 mmol/L (ref 135–145)
Total Bilirubin: <0.1 mg/dL — ABNORMAL LOW (ref 0.3–1.2)
Total Protein: 5.4 g/dL — ABNORMAL LOW (ref 6.5–8.1)

## 2019-03-25 LAB — CBC WITH DIFFERENTIAL/PLATELET
Abs Immature Granulocytes: 0.08 10*3/uL — ABNORMAL HIGH (ref 0.00–0.07)
Basophils Absolute: 0 10*3/uL (ref 0.0–0.1)
Basophils Relative: 0 %
Eosinophils Absolute: 0 10*3/uL (ref 0.0–0.5)
Eosinophils Relative: 0 %
HCT: 32.3 % — ABNORMAL LOW (ref 36.0–46.0)
Hemoglobin: 10.2 g/dL — ABNORMAL LOW (ref 12.0–15.0)
Immature Granulocytes: 1 %
Lymphocytes Relative: 16 %
Lymphs Abs: 2.3 10*3/uL (ref 0.7–4.0)
MCH: 31 pg (ref 26.0–34.0)
MCHC: 31.6 g/dL (ref 30.0–36.0)
MCV: 98.2 fL (ref 80.0–100.0)
Monocytes Absolute: 1.1 10*3/uL — ABNORMAL HIGH (ref 0.1–1.0)
Monocytes Relative: 7 %
Neutro Abs: 10.9 10*3/uL — ABNORMAL HIGH (ref 1.7–7.7)
Neutrophils Relative %: 76 %
Platelets: 260 10*3/uL (ref 150–400)
RBC: 3.29 MIL/uL — ABNORMAL LOW (ref 3.87–5.11)
RDW: 13.4 % (ref 11.5–15.5)
WBC: 14.4 10*3/uL — ABNORMAL HIGH (ref 4.0–10.5)
nRBC: 0 % (ref 0.0–0.2)

## 2019-03-25 LAB — VANCOMYCIN, TROUGH: Vancomycin Tr: 15 ug/mL (ref 15–20)

## 2019-03-25 LAB — PHOSPHORUS: Phosphorus: 2.2 mg/dL — ABNORMAL LOW (ref 2.5–4.6)

## 2019-03-25 LAB — MAGNESIUM: Magnesium: 1.8 mg/dL (ref 1.7–2.4)

## 2019-03-25 MED ORDER — POTASSIUM CHLORIDE CRYS ER 20 MEQ PO TBCR
40.0000 meq | EXTENDED_RELEASE_TABLET | Freq: Every day | ORAL | Status: DC
  Administered 2019-03-25 – 2019-03-28 (×4): 40 meq via ORAL
  Filled 2019-03-25 (×4): qty 2

## 2019-03-25 MED ORDER — MELATONIN 3 MG PO TABS
6.0000 mg | ORAL_TABLET | Freq: Every day | ORAL | Status: DC
  Administered 2019-03-25 – 2019-03-27 (×3): 6 mg via ORAL
  Filled 2019-03-25 (×4): qty 2

## 2019-03-25 MED ORDER — WHITE PETROLATUM EX OINT
TOPICAL_OINTMENT | CUTANEOUS | Status: AC
  Administered 2019-03-25: 0.2
  Filled 2019-03-25: qty 28.35

## 2019-03-25 MED ORDER — NON FORMULARY: 6.0000 mg | Freq: Every day | Status: DC

## 2019-03-25 MED ORDER — TRAMADOL HCL 50 MG PO TABS
50.0000 mg | ORAL_TABLET | Freq: Four times a day (QID) | ORAL | Status: DC | PRN
  Administered 2019-03-25 – 2019-03-26 (×3): 50 mg via ORAL
  Filled 2019-03-25 (×3): qty 1

## 2019-03-25 MED ORDER — ONDANSETRON HCL 4 MG PO TABS
4.0000 mg | ORAL_TABLET | Freq: Three times a day (TID) | ORAL | Status: DC | PRN
  Administered 2019-03-25: 4 mg via ORAL
  Filled 2019-03-25: qty 1

## 2019-03-25 MED ORDER — PROCHLORPERAZINE MALEATE 5 MG PO TABS
5.0000 mg | ORAL_TABLET | Freq: Four times a day (QID) | ORAL | Status: DC | PRN
  Administered 2019-03-25 – 2019-03-26 (×2): 5 mg via ORAL
  Filled 2019-03-25 (×4): qty 1

## 2019-03-25 MED ORDER — K PHOS MONO-SOD PHOS DI & MONO 155-852-130 MG PO TABS
500.0000 mg | ORAL_TABLET | Freq: Three times a day (TID) | ORAL | Status: DC
  Administered 2019-03-25 (×3): 500 mg via ORAL
  Filled 2019-03-25 (×5): qty 2

## 2019-03-25 MED ORDER — SODIUM CHLORIDE 0.45 % IV SOLN: Freq: Once | INTRAVENOUS | Status: AC

## 2019-03-25 MED ORDER — POTASSIUM PHOSPHATE MONOBASIC 500 MG PO TABS: 500.0000 mg | ORAL_TABLET | Freq: Three times a day (TID) | ORAL | Status: DC

## 2019-03-25 NOTE — Progress Notes (Signed)
Pharmacy Antibiotic Note  Megan Lara is a 39 y.o. female admitted on 03/23/2019 with r/o meningitis. Patient presented with a headache, neck pain, weakness, myalgias, and fever x 1 day. Pharmacy has been consulted for vancomycin dosing. Also on acyclovir and ceftriaxone. S/p LP on 1/19. SCr stable at 0.63. Patient is afebrile today and leukocytosis has improved since admission 17.9<14.4.   Vancomycin trough level was at goal this afternoon at 15 mcg/mL (Goal 15-20). Per Dr. Ronaldo Miyamoto, ID recommended holding antibiotics and monitoring. If continuing vancomycin, may need to consider obtaining another vancomycin trough level soon to determine if dose needs to be increased.   Plan: Continue vancomycin to 750mg  IV q8h for meningitis dosing Ceftriaxone 2g IV q12h Acyclovir 780mg  (10mg /kg TBW) IV q8h Monitor clinical progress, c/s, renal function, HSV PCR F/u de-escalation plan/LOT, vancomycin levels as indicated    Height: 5\' 4"  (162.6 cm) Weight: 171 lb 15.3 oz (78 kg) IBW/kg (Calculated) : 54.7  Temp (24hrs), Avg:98.6 F (37 C), Min:98.3 F (36.8 C), Max:98.7 F (37.1 C)  Recent Labs  Lab 03/23/19 1437 03/23/19 1702 03/23/19 2130 03/24/19 0105 03/25/19 0134 03/25/19 1324  WBC 16.6*  --  17.9* 18.5* 14.4*  --   CREATININE 0.77  --  0.69 0.65 0.63  --   LATICACIDVEN 0.8 1.9  --  0.6  --   --   VANCOTROUGH  --   --   --   --   --  15    Estimated Creatinine Clearance: 98.2 mL/min (by C-G formula based on SCr of 0.63 mg/dL).    No Known Allergies  Antimicrobials this admission: 1/19 vancomycin >>  1/19 ceftriaxone >>  1/19 acyclovir >>  Microbiology results: 1/19 CSF cx- ngtd 1/19 CSF cell ct - neut 85, lymp 7, wbc 46, prot 47, gluc 73 1/19 Bcx: ngtd 1/19 Ucx: ngtd 1/19 flu/covid - neg; 1/19 repeat covid - pending 1/20 GI panel - sent 1/20 HSV pcr - sent   2/19, PharmD PGY1 Pharmacy Resident

## 2019-03-25 NOTE — Progress Notes (Signed)
[name] Kitchen  PROGRESS NOTE    Megan Lara  VOJ:[id number] DOB: Feb 23, 1981 DOA: 03/23/2019 PCP: Jonathon Jordan, MD   Brief Narrative:   Patient presents with 1 day history of fever, headache, neck pain, nausea and vomiting, myalgia, weakness, nausea and vomiting. There is associated photophobia. No chest pain, no shortness of breath, no URI symptoms and no urinary symptoms reported. On presentation to the hospital, T-max was 103.2, with significant tachycardia that peaked at 134 bpm, leukocytosis of 16.6.  03/25/19: Improving. I would say we can drop acyclovir in AM. Still not sure what we are targeting. Cx data negative to this point. WBCs and temperature curve improved. She is afebrile.    Assessment & Plan:   Active Problems:   Meningitis  Meningitis Sepsis     - at admission: temp 103.2, HR 134, WBC 16.6; she has also been hypotensive; source presumed menegitis at this time     - per previous physician, ED contacted ID; right now I only see neurosurgery contacted     - s/p LP     - Bld Cx, CSF Cx all negative to date     - UCx no growth     - continue IV vancomycin, rocephin, acyclovir     - pt is s/p dose of IV decadron     - afebrile, BP is improving, white count trending down     - CXR is negative     - CTA Head and Neck is negative     - would like to narrow antimicrobials, but still not sure what we are targeting; spoke with ID about her picture, rec'd holding abx and monitor.     - let's complete today's dosing and hold antimicrobials in the AM; monitor  Hyponatremia Hypokalemia Hypophosphatemia     - K+ added     - Mg2+ is ok     - Na+ resolved     - add phos  Volume depletion Metabolic acidosis     - continue IVF, but change to half normal for today for 1L     - volume has improved; encourage PO intake  Hypothyroidism     - on levothyroxine at home (63mg one day, 722m the next in alternating fashion); continue here  Anxiety/depression     - home  regimen: buspar, zoloft     - buspar has been resumed  DVT prophylaxis: lovenox Code Status: FULL Family Communication: None at bedside   Disposition Plan: Remains on IV abx. W/u ongiong.  Procedures:   LP  Antimicrobials:  . Rocephin, vancomycin, acyclovir   ROS:  Denies CP, N, V, dyspnea. Remainder 10-pt ROS is negative for all not previously mentioned.  Subjective: "It's getting better."  Objective: Vitals:   03/24/19 0447 03/24/19 1439 03/24/19 2000 03/25/19 0520  BP: (!) 90/57 (!) 95/52 123/68 (!) 111/59  Pulse: 73 78 78 77  Resp: _0 Temp: 97.8 F (36.6 C) 98.4 F (36.9 C) 98.7 F (37.1 C) 98.7 F (37.1 C)  TempSrc: Oral Oral Oral Oral  SpO2: 99% 99% 98% 98%  Weight:      Height:        Intake/Output Summary (Last 24 hours) at 03/25/2019 0909 Last data filed at 03/25/2019 0400 Gross per 24 hour  Intake 3643.05 ml  Output --  Net 3643.05 ml   Filed Weights   03/23/19 1702  Weight: 78 kg    Examination:  General: [age].o. female resting in bed in  NAD Cardiovascular: RRR, +S1, S2, no m/g/r Respiratory: CTABL, no w/r/r, normal WOB GI: BS+, NDNT, soft MSK: No e/c/c Neuro: A&O x 3, no focal deficits Psyc: Appropriate interaction and affect, calm/cooperative   Data Reviewed: I have personally reviewed following labs and imaging studies.  CBC: Recent Labs  Lab 03/23/19 1437 03/23/19 2130 03/24/19 0105 03/25/19 0134  WBC 16.6* 17.9* 18.5* 14.4*  NEUTROABS 14.6*  --   --  10.9*  HGB 12.5 12.1 12.1 10.2*  HCT 37.8 38.2 37.2 32.3*  MCV 95.5 100.3* 96.4 98.2  PLT 276 298 255 786   Basic Metabolic Panel: Recent Labs  Lab 03/23/19 1437 03/23/19 2130 03/24/19 0105 03/25/19 0134  NA 134*  --  139 142  K 3.4*  --  3.4* 3.4*  CL 102  --  110 113*  CO2 21*  --  18* 20*  GLUCOSE 126*  --  182* 127*  BUN 7  --  6 8  CREATININE 0.77 0.69 0.65 0.63  CALCIUM 8.8*  --  7.8* 7.9*  MG  --  1.8  --  1.8  PHOS  --  2.9  --  2.2*    GFR: Estimated Creatinine Clearance: 98.2 mL/min (by C-G formula based on SCr of 0.63 mg/dL). Liver Function Tests: Recent Labs  Lab 03/23/19 1437 03/25/19 0134  AST 19 12*  ALT 15 15  ALKPHOS 52 38  BILITOT 1.2 <0.1*  PROT 6.5 5.4*  ALBUMIN 3.6 2.8*   No results for input(s): LIPASE, AMYLASE in the last 168 hours. No results for input(s): AMMONIA in the last 168 hours. Coagulation Profile: Recent Labs  Lab 03/23/19 1702  INR 1.1   Cardiac Enzymes: No results for input(s): CKTOTAL, CKMB, CKMBINDEX, TROPONINI in the last 168 hours. BNP (last 3 results) No results for input(s): PROBNP in the last 8760 hours. HbA1C: No results for input(s): HGBA1C in the last 72 hours. CBG: No results for input(s): GLUCAP in the last 168 hours. Lipid Profile: No results for input(s): CHOL, HDL, LDLCALC, TRIG, CHOLHDL, LDLDIRECT in the last 72 hours. Thyroid Function Tests: Recent Labs    03/23/19 2130  TSH 0.851   Anemia Panel: No results for input(s): VITAMINB12, FOLATE, FERRITIN, TIBC, IRON, RETICCTPCT in the last 72 hours. Sepsis Labs: Recent Labs  Lab 03/23/19 1437 03/23/19 1702 03/24/19 0105  LATICACIDVEN 0.8 1.9 0.6    Recent Results (from the past 240 hour(s))  Novel Coronavirus, NAA (Hosp order, Send-out to Ref Lab; TAT 18-24 hrs     Status: None   Collection Time: 03/23/19  2:55 PM   Specimen: Flu Kit Nasopharyngeal Swab; Respiratory  Result Value Ref Range Status   SARS-CoV-2, NAA NOT DETECTED NOT DETECTED Final    Comment: (NOTE) This nucleic acid amplification test was developed and its performance characteristics determined by [employer]. Nucleic acid amplification tests include RT-PCR and TMA. This test has not been FDA cleared or approved. This test has been authorized by FDA under an Emergency Use Authorization (EUA). This test is only authorized for the duration of time the declaration that circumstances exist justifying the authorization of  the emergency use of in vitro diagnostic tests for detection of SARS-CoV-2 virus and/or diagnosis of COVID-19 infection under section 564(b)(1) of the Act, 21 U.S.C. 767MCN-4(B) (1), unless the authorization is terminated or revoked sooner. When diagnostic testing is negative, the possibility of a false negative result should be considered in the context of a patient's recent exposures and the presence of clinical signs  and symptoms consistent with COVID-19. An individual without symptoms of COVID- 19 and who is not shedding SARS-CoV-2  virus would expect to have a negative (not detected) result in this assay. Performed At: [facility] [address] [place], [place] [id number] Rush Farmer MD CE:[id number]    [place]  Final    Comment: Performed at [facility], [place] [address] [place], [place] [address]  Urine culture     Status: None   Collection Time: 03/23/19  3:26 PM   Specimen: In/Out Cath Urine  Result Value Ref Range Status   Specimen Description IN/OUT CATH URINE  Final   Special Requests NONE  Final   Culture   Final    NO GROWTH Performed at [facility], [place] [address] [place], [place] [address]    Report Status 03/24/2019 FINAL  Final  Blood Culture (routine x 2)     Status: None (Preliminary result)   Collection Time: 03/23/19  5:02 PM   Specimen: BLOOD  Result Value Ref Range Status   Specimen Description BLOOD RIGHT ANTECUBITAL  Final   Special Requests   Final    BOTTLES DRAWN AEROBIC ONLY Blood Culture results may not be optimal due to an inadequate volume of blood received in culture bottles   Culture   Final    NO GROWTH 2 DAYS Performed at [facility], [place] [address], [place], [place] [address]    Report Status PENDING  Incomplete  Respiratory Panel by RT PCR (Flu A&B, Covid) - Nasopharyngeal Swab     Status: None   Collection Time: 03/23/19  5:43 PM   Specimen: Nasopharyngeal Swab  Result Value  Ref Range Status   SARS Coronavirus 2 by RT PCR NEGATIVE NEGATIVE Final    Comment: (NOTE) SARS-CoV-2 target nucleic acids are NOT DETECTED. The SARS-CoV-2 RNA is generally detectable in upper respiratoy specimens during the acute phase of infection. The lowest concentration of SARS-CoV-2 viral copies this assay can detect is 131 copies/mL. A negative result does not preclude SARS-Cov-2 infection and should not be used as the sole basis for treatment or other patient management decisions. A negative result may occur with  improper specimen collection/handling, submission of specimen other than nasopharyngeal swab, presence of viral mutation(s) within the areas targeted by this assay, and inadequate number of viral copies (<131 copies/mL). A negative result must be combined with clinical observations, patient history, and epidemiological information. The expected result is Negative. Fact Sheet for Patients:  PinkCheek.be Fact Sheet for Healthcare Providers:  GravelBags.it This test is not yet ap proved or cleared by the [place] FDA and  has been authorized for detection and/or diagnosis of SARS-CoV-2 by FDA under an Emergency Use Authorization (EUA). This EUA will remain  in effect (meaning this test can be used) for the duration of the COVID-19 declaration under Section 564(b)(1) of the Act, 21 U.S.C. section 360bbb-3(b)(1), unless the authorization is terminated or revoked sooner.    Influenza A by PCR NEGATIVE NEGATIVE Final   Influenza B by PCR NEGATIVE NEGATIVE Final    Comment: (NOTE) The Xpert Xpress SARS-CoV-2/FLU/RSV assay is intended as an aid in  the diagnosis of influenza from Nasopharyngeal swab specimens and  should not be used as a sole basis for treatment. Nasal washings and  aspirates are unacceptable for Xpert Xpress SARS-CoV-2/FLU/RSV  testing. Fact Sheet for  Patients: PinkCheek.be Fact Sheet for Healthcare Providers: GravelBags.it This test is not yet approved or cleared  by the [place] and  has been authorized for detection and/or diagnosis of SARS-CoV-2 by  FDA under an Emergency Use Authorization (EUA). This EUA will remain  in effect (meaning this test can be used) for the duration of the  Covid-19 declaration under Section 564(b)(1) of the Act, 21  U.S.C. section 360bbb-3(b)(1), unless the authorization is  terminated or revoked. Performed at [facility], [place] [address], [place], [place] [address]   CSF culture with Stat gram stain     Status: None (Preliminary result)   Collection Time: 03/23/19  6:06 PM   Specimen: CSF; Cerebrospinal Fluid  Result Value Ref Range Status   Specimen Description CSF  Final   Special Requests NONE  Final   Gram Stain   Final    WBC PRESENT, PREDOMINANTLY PMN NO ORGANISMS SEEN CYTOSPIN SMEAR    Culture   Final    NO GROWTH 2 DAYS Performed at [facility], [place] [address] [place], [place] [address]    Report Status PENDING  Incomplete  Culture, blood (single) w Reflex to ID Panel     Status: None (Preliminary result)   Collection Time: 03/24/19  8:37 PM   Specimen: BLOOD RIGHT HAND  Result Value Ref Range Status   Specimen Description BLOOD RIGHT HAND  Final   Special Requests   Final    BOTTLES DRAWN AEROBIC AND ANAEROBIC Blood Culture adequate volume   Culture   Final    NO GROWTH < 12 HOURS Performed at [facility], [place] [address], [place], [place] [address]    Report Status PENDING  Incomplete      Radiology Studies: CT Angio Head W or Wo Contrast  Result Date: 03/23/2019 CLINICAL DATA:  Fever and headache. EXAM: CT ANGIOGRAPHY HEAD AND NECK TECHNIQUE: Multidetector CT imaging of the head and neck was performed using the standard protocol during bolus administration of intravenous contrast.  Multiplanar CT image reconstructions and MIPs were obtained to evaluate the vascular anatomy. Carotid stenosis measurements (when applicable) are obtained utilizing NASCET criteria, using the distal internal carotid diameter as the denominator. CONTRAST:  11m OMNIPAQUE IOHEXOL 350 MG/ML SOLN COMPARISON:  CT head 03/23/2019 FINDINGS: CTA NECK FINDINGS Aortic arch: Standard branching. Imaged portion shows no evidence of aneurysm or dissection. No significant stenosis of the major arch vessel origins. Right carotid system: Normal right carotid. Negative for stenosis or dissection. No significant atherosclerotic disease. Left carotid system: Normal left carotid. Negative for stenosis or dissection. No significant atherosclerotic disease. Vertebral arteries: Normal vertebral arteries bilaterally Skeleton: Negative Other neck: Negative Upper chest: Lung apices clear bilaterally. Review of the MIP images confirms the above findings CTA HEAD FINDINGS Anterior circulation: Cavernous carotid normal bilaterally. Anterior and middle cerebral arteries patent bilaterally without stenosis. Negative for aneurysm. Posterior circulation: Both vertebral arteries patent to the basilar. PICA patent bilaterally. Basilar widely patent. Superior cerebellar and posterior cerebral arteries patent bilaterally. Fetal origin right posterior cerebral artery. Negative for aneurysm. Venous sinuses: Negative Anatomic variants: None Review of the MIP images confirms the above findings IMPRESSION: Negative CTA head and neck. No significant stenosis or large vessel occlusion. Negative for aneurysm or dissection. Electronically Signed   By: CFranchot GalloM.D.   On: 03/23/2019 21:16   CT Head Wo Contrast  Result Date: 03/23/2019 CLINICAL DATA:  Headache. EXAM: CT HEAD WITHOUT CONTRAST TECHNIQUE: Contiguous axial images were obtained from the base of the skull through the vertex without intravenous contrast. COMPARISON:  October 11, 2015 FINDINGS:  Brain: No evidence of acute infarction, hemorrhage, hydrocephalus, extra-axial collection or mass lesion/mass effect. Vascular: No hyperdense vessel or unexpected calcification. Skull: Normal. Negative for fracture or focal lesion. Sinuses/Orbits: No acute finding. Other: The metallic density foreign body seen within the subcutaneous tissues adjacent to the anterior aspect of the frontal sinus on the left on the prior study is no longer present. IMPRESSION: No acute intracranial pathology. Electronically Signed   By: Virgina Norfolk M.D.   On: 03/23/2019 17:35   CT Angio Neck W and/or Wo Contrast  Result Date: 03/23/2019 CLINICAL DATA:  Fever and headache. EXAM: CT ANGIOGRAPHY HEAD AND NECK TECHNIQUE: Multidetector CT imaging of the head and neck was performed using the standard protocol during bolus administration of intravenous contrast. Multiplanar CT image reconstructions and MIPs were obtained to evaluate the vascular anatomy. Carotid stenosis measurements (when applicable) are obtained utilizing NASCET criteria, using the distal internal carotid diameter as the denominator. CONTRAST:  27m OMNIPAQUE IOHEXOL 350 MG/ML SOLN COMPARISON:  CT head 03/23/2019 FINDINGS: CTA NECK FINDINGS Aortic arch: Standard branching. Imaged portion shows no evidence of aneurysm or dissection. No significant stenosis of the major arch vessel origins. Right carotid system: Normal right carotid. Negative for stenosis or dissection. No significant atherosclerotic disease. Left carotid system: Normal left carotid. Negative for stenosis or dissection. No significant atherosclerotic disease. Vertebral arteries: Normal vertebral arteries bilaterally Skeleton: Negative Other neck: Negative Upper chest: Lung apices clear bilaterally. Review of the MIP images confirms the above findings CTA HEAD FINDINGS Anterior circulation: Cavernous carotid normal bilaterally. Anterior and middle cerebral arteries patent bilaterally without  stenosis. Negative for aneurysm. Posterior circulation: Both vertebral arteries patent to the basilar. PICA patent bilaterally. Basilar widely patent. Superior cerebellar and posterior cerebral arteries patent bilaterally. Fetal origin right posterior cerebral artery. Negative for aneurysm. Venous sinuses: Negative Anatomic variants: None Review of the MIP images confirms the above findings IMPRESSION: Negative CTA head and neck. No significant stenosis or large vessel occlusion. Negative for aneurysm or dissection. Electronically Signed   By: CFranchot GalloM.D.   On: 03/23/2019 21:16   DG Chest Portable 1 View  Result Date: 03/23/2019 CLINICAL DATA:  Fever and short of breath EXAM: PORTABLE CHEST 1 VIEW COMPARISON:  09/12/2012 FINDINGS: The heart size and mediastinal contours are within normal limits. Both lungs are clear. The visualized skeletal structures are unremarkable. IMPRESSION: No active disease. Electronically Signed   By: CFranchot GalloM.D.   On: 03/23/2019 14:06     Scheduled Meds: . busPIRone  5 mg Oral BID  . enoxaparin (LOVENOX) injection  40 mg Subcutaneous Q24H  . levothyroxine  50 mcg Oral QODAY  . levothyroxine  75 mcg Oral QODAY  . lidocaine  1 patch Transdermal Q24H  . loratadine  10 mg Oral Daily  . potassium chloride  40 mEq Oral Daily  . sertraline  100 mg Oral QHS   Continuous Infusions: . 0.9 % NaCl with KCl 20 mEq / L 75 mL/hr at 03/25/19 0249  . acyclovir 780 mg (03/25/19 0605)  . cefTRIAXone (ROCEPHIN)  IV 2 g (03/25/19 0433)  . vancomycin 750 mg (03/25/19 0606)     LOS: 2 days    Time spent: 25 minutes spent in the coordination of care today.    TJonnie Finner DO [facility]  If 7PM-7AM, please contact night-coverage [email or web address] 03/25/2019, 9:09 AM

## 2019-03-25 NOTE — Progress Notes (Signed)
Patient continues to complain of headache despite prn medications administered. Dr. Ronaldo Miyamoto notified, patient has not had much relief today. States she feels similar to when she was admitted.

## 2019-03-26 LAB — CBC WITH DIFFERENTIAL/PLATELET
Abs Immature Granulocytes: 0.05 10*3/uL (ref 0.00–0.07)
Basophils Absolute: 0 10*3/uL (ref 0.0–0.1)
Basophils Relative: 1 %
Eosinophils Absolute: 0.1 10*3/uL (ref 0.0–0.5)
Eosinophils Relative: 1 %
HCT: 33.8 % — ABNORMAL LOW (ref 36.0–46.0)
Hemoglobin: 10.8 g/dL — ABNORMAL LOW (ref 12.0–15.0)
Immature Granulocytes: 1 %
Lymphocytes Relative: 26 %
Lymphs Abs: 2 10*3/uL (ref 0.7–4.0)
MCH: 31.3 pg (ref 26.0–34.0)
MCHC: 32 g/dL (ref 30.0–36.0)
MCV: 98 fL (ref 80.0–100.0)
Monocytes Absolute: 0.7 10*3/uL (ref 0.1–1.0)
Monocytes Relative: 9 %
Neutro Abs: 5 10*3/uL (ref 1.7–7.7)
Neutrophils Relative %: 62 %
Platelets: 269 10*3/uL (ref 150–400)
RBC: 3.45 MIL/uL — ABNORMAL LOW (ref 3.87–5.11)
RDW: 13.4 % (ref 11.5–15.5)
WBC: 8 10*3/uL (ref 4.0–10.5)
nRBC: 0 % (ref 0.0–0.2)

## 2019-03-26 LAB — RENAL FUNCTION PANEL
Albumin: 2.8 g/dL — ABNORMAL LOW (ref 3.5–5.0)
Anion gap: 12 (ref 5–15)
BUN: 5 mg/dL — ABNORMAL LOW (ref 6–20)
CO2: 20 mmol/L — ABNORMAL LOW (ref 22–32)
Calcium: 8.1 mg/dL — ABNORMAL LOW (ref 8.9–10.3)
Chloride: 106 mmol/L (ref 98–111)
Creatinine, Ser: 0.58 mg/dL (ref 0.44–1.00)
GFR calc Af Amer: >60 mL/min (ref >60)
GFR calc non Af Amer: >60 mL/min (ref >60)
Glucose, Bld: 91 mg/dL (ref 70–99)
Phosphorus: 4.8 mg/dL — ABNORMAL HIGH (ref 2.5–4.6)
Potassium: 3.6 mmol/L (ref 3.5–5.1)
Sodium: 138 mmol/L (ref 135–145)

## 2019-03-26 LAB — MAGNESIUM: Magnesium: 1.9 mg/dL (ref 1.7–2.4)

## 2019-03-26 MED ORDER — PROCHLORPERAZINE MALEATE 10 MG PO TABS
10.0000 mg | ORAL_TABLET | Freq: Three times a day (TID) | ORAL | Status: DC
  Administered 2019-03-26 – 2019-03-28 (×7): 10 mg via ORAL
  Filled 2019-03-26 (×8): qty 1

## 2019-03-26 MED ORDER — KETOROLAC TROMETHAMINE 30 MG/ML IJ SOLN
30.0000 mg | Freq: Once | INTRAMUSCULAR | Status: AC
  Administered 2019-03-26: 30 mg via INTRAVENOUS
  Filled 2019-03-26: qty 1

## 2019-03-26 MED ORDER — IBUPROFEN 600 MG PO TABS
600.0000 mg | ORAL_TABLET | Freq: Four times a day (QID) | ORAL | Status: DC | PRN
  Administered 2019-03-26 – 2019-03-28 (×5): 600 mg via ORAL
  Filled 2019-03-26 (×5): qty 1

## 2019-03-26 NOTE — Plan of Care (Signed)

## 2019-03-26 NOTE — Progress Notes (Addendum)
[name] Kitchen  PROGRESS NOTE    Megan Lara  HWT:[id number] DOB: 12-26-1980 DOA: 03/23/2019 PCP: Jonathon Jordan, MD   Brief Narrative:   Patient presents with 1 day history of fever, headache, neck pain, nausea and vomiting, myalgia, weakness, nausea and vomiting. There is associated photophobia. No chest pain, no shortness of breath, no URI symptoms and no urinary symptoms reported. On presentation to the hospital, T-max was 103.2, with significant tachycardia that peaked at 134 bpm, leukocytosis of 16.6.  03/25/19: Lab work looks good this AM. Her symptoms are not completely resolved. Still c/o HA and some N. Spoke with neuro this AM. Will add NSAIDs and increase compazine. Start with toradol injection and follow with ibuprofen and watch for improvement. I think while we have these symptoms, we continue our antimicrobials.     Assessment & Plan:   Active Problems:   Meningitis  Meningitis Sepsis - at admission: temp 103.2, HR 134, WBC 16.6; she has also been hypotensive; source presumed menegitis at this time - per previous physician, ED contacted ID; right now I only see neurosurgery contacted - s/p LP - Bld Cx, CSF Cx all negative to date     - UCx no growth - continue IV vancomycin, rocephin, acyclovir - pt is s/p dose of IV decadron - afebrile, BP is improving, white count trending down - CXR is negative     - CTA Head and Neck is negative     - would like to narrow antimicrobials, but still not sure what we are targeting; spoke with ID about her picture, rec'd holding abx and monitor.     - still having HA and N; adding toradol and increasing compazine after talking with neuro.   Hyponatremia Hypokalemia Hypophosphatemia - K+ improved, continue supplementation. - Mg2+ is ok     - Na+ resolved     - Phos resolved; d/c supplementation  Volume depletion Metabolic acidosis - continue IVF, but change to half normal for  today for 1L     - volume has improved; encourage PO intake  Hypothyroidism - on levothyroxine at home (52mg one day, 756m the next in alternating fashion); continue here  Anxiety/depression - home regimen: buspar, zoloft - buspar has been resumed  DVT prophylaxis: lovenox Code Status: FULL Family Communication: None at bedside   Disposition Plan: To home when Sx have improved, remains on IV abx  Procedures:   LP  Antimicrobials:  . Vanc, rocephin, acyclovir   ROS:  Reports HA, V. Denies CP, ab pain, dyspnea . Remainder 10-pt ROS is negative for all not previously mentioned.  Subjective: "My head is killing me."  Objective: Vitals:   03/25/19 1358 03/25/19 1600 03/25/19 2052 03/26/19 0431  BP: 123/83  112/77 108/69  Pulse: 76  92 86  Resp: '19  20 18  '$ Temp: 98.3 F (36.8 C) 99.2 F (37.3 C) 97.8 F (36.6 C) 98.5 F (36.9 C)  TempSrc: Oral Oral Oral Oral  SpO2: 100%  98% 98%  Weight:      Height:        Intake/Output Summary (Last 24 hours) at 03/26/2019 0851 Last data filed at 03/26/2019 0700 Gross per 24 hour  Intake 3441.29 ml  Output --  Net 3441.29 ml   Filed Weights   03/23/19 1702  Weight: 78 kg    Examination:  General: [age].o. female resting in bed in NAD Cardiovascular: RRR, +S1, S2, no m/g/r, equal pulses throughout Respiratory: CTABL, no w/r/r, normal WOB GI: BS+, NDNT,  no masses noted, no organomegaly noted MSK: No e/c/c Neuro: A&O x 3, no focal deficits Psyc: Appropriate interaction and affect, calm/cooperative  Data Reviewed: I have personally reviewed following labs and imaging studies.  CBC: Recent Labs  Lab 03/23/19 1437 03/23/19 2130 03/24/19 0105 03/25/19 0134 03/26/19 0249  WBC 16.6* 17.9* 18.5* 14.4* 8.0  NEUTROABS 14.6*  --   --  10.9* 5.0  HGB 12.5 12.1 12.1 10.2* 10.8*  HCT 37.8 38.2 37.2 32.3* 33.8*  MCV 95.5 100.3* 96.4 98.2 98.0  PLT 276 298 255 260 789   Basic Metabolic Panel: Recent Labs   Lab 03/23/19 1437 03/23/19 2130 03/24/19 0105 03/25/19 0134 03/26/19 0249  NA 134*  --  139 142 138  K 3.4*  --  3.4* 3.4* 3.6  CL 102  --  110 113* 106  CO2 21*  --  18* 20* 20*  GLUCOSE 126*  --  182* 127* 91  BUN 7  --  6 8 5*  CREATININE 0.77 0.69 0.65 0.63 0.58  CALCIUM 8.8*  --  7.8* 7.9* 8.1*  MG  --  1.8  --  1.8 1.9  PHOS  --  2.9  --  2.2* 4.8*   GFR: Estimated Creatinine Clearance: 98.2 mL/min (by C-G formula based on SCr of 0.58 mg/dL). Liver Function Tests: Recent Labs  Lab 03/23/19 1437 03/25/19 0134 03/26/19 0249  AST 19 12*  --   ALT 15 15  --   ALKPHOS 52 38  --   BILITOT 1.2 <0.1*  --   PROT 6.5 5.4*  --   ALBUMIN 3.6 2.8* 2.8*   No results for input(s): LIPASE, AMYLASE in the last 168 hours. No results for input(s): AMMONIA in the last 168 hours. Coagulation Profile: Recent Labs  Lab 03/23/19 1702  INR 1.1   Cardiac Enzymes: No results for input(s): CKTOTAL, CKMB, CKMBINDEX, TROPONINI in the last 168 hours. BNP (last 3 results) No results for input(s): PROBNP in the last 8760 hours. HbA1C: No results for input(s): HGBA1C in the last 72 hours. CBG: No results for input(s): GLUCAP in the last 168 hours. Lipid Profile: No results for input(s): CHOL, HDL, LDLCALC, TRIG, CHOLHDL, LDLDIRECT in the last 72 hours. Thyroid Function Tests: Recent Labs    03/23/19 2130  TSH 0.851   Anemia Panel: No results for input(s): VITAMINB12, FOLATE, FERRITIN, TIBC, IRON, RETICCTPCT in the last 72 hours. Sepsis Labs: Recent Labs  Lab 03/23/19 1437 03/23/19 1702 03/24/19 0105  LATICACIDVEN 0.8 1.9 0.6    Recent Results (from the past 240 hour(s))  Novel Coronavirus, NAA (Hosp order, Send-out to Ref Lab; TAT 18-24 hrs     Status: None   Collection Time: 03/23/19  2:55 PM   Specimen: Flu Kit Nasopharyngeal Swab; Respiratory  Result Value Ref Range Status   SARS-CoV-2, NAA NOT DETECTED NOT DETECTED Final    Comment: (NOTE) This nucleic acid  amplification test was developed and its performance characteristics determined by [employer]. Nucleic acid amplification tests include RT-PCR and TMA. This test has not been FDA cleared or approved. This test has been authorized by FDA under an Emergency Use Authorization (EUA). This test is only authorized for the duration of time the declaration that circumstances exist justifying the authorization of the emergency use of in vitro diagnostic tests for detection of SARS-CoV-2 virus and/or diagnosis of COVID-19 infection under section 564(b)(1) of the Act, 21 U.S.C. 381OFB-5(Z) (1), unless the authorization is terminated or revoked sooner. When diagnostic testing  is negative, the possibility of a false negative result should be considered in the context of a patient's recent exposures and the presence of clinical signs and symptoms consistent with COVID-19. An individual without symptoms of COVID- 19 and who is not shedding SARS-CoV-2  virus would expect to have a negative (not detected) result in this assay. Performed At: [facility] [address] [place], [place] [id number] Rush Farmer MD TD:[id number]    [place]  Final    Comment: Performed at [facility], [place] [address] [place], [place] [address]  Urine culture     Status: None   Collection Time: 03/23/19  3:26 PM   Specimen: In/Out Cath Urine  Result Value Ref Range Status   Specimen Description IN/OUT CATH URINE  Final   Special Requests NONE  Final   Culture   Final    NO GROWTH Performed at [facility], [place] [address] [place], [place] [address]    Report Status 03/24/2019 FINAL  Final  Blood Culture (routine x 2)     Status: None (Preliminary result)   Collection Time: 03/23/19  5:02 PM   Specimen: BLOOD  Result Value Ref Range Status   Specimen Description BLOOD RIGHT ANTECUBITAL  Final   Special Requests   Final    BOTTLES DRAWN AEROBIC ONLY Blood  Culture results may not be optimal due to an inadequate volume of blood received in culture bottles   Culture   Final    NO GROWTH 2 DAYS Performed at [facility], [place] [address] [place], [place] [address]    Report Status PENDING  Incomplete  Respiratory Panel by RT PCR (Flu A&B, Covid) - Nasopharyngeal Swab     Status: None   Collection Time: 03/23/19  5:43 PM   Specimen: Nasopharyngeal Swab  Result Value Ref Range Status   SARS Coronavirus 2 by RT PCR NEGATIVE NEGATIVE Final    Comment: (NOTE) SARS-CoV-2 target nucleic acids are NOT DETECTED. The SARS-CoV-2 RNA is generally detectable in upper respiratoy specimens during the acute phase of infection. The lowest concentration of SARS-CoV-2 viral copies this assay can detect is 131 copies/mL. A negative result does not preclude SARS-Cov-2 infection and should not be used as the sole basis for treatment or other patient management decisions. A negative result may occur with  improper specimen collection/handling, submission of specimen other than nasopharyngeal swab, presence of viral mutation(s) within the areas targeted by this assay, and inadequate number of viral copies (<131 copies/mL). A negative result must be combined with clinical observations, patient history, and epidemiological information. The expected result is Negative. Fact Sheet for Patients:  PinkCheek.be Fact Sheet for Healthcare Providers:  GravelBags.it This test is not yet ap proved or cleared by the [place] FDA and  has been authorized for detection and/or diagnosis of SARS-CoV-2 by FDA under an Emergency Use Authorization (EUA). This EUA will remain  in effect (meaning this test can be used) for the duration of the COVID-19 declaration under Section 564(b)(1) of the Act, 21 U.S.C. section 360bbb-3(b)(1), unless the authorization is terminated or revoked sooner.    Influenza A by PCR  NEGATIVE NEGATIVE Final   Influenza B by PCR NEGATIVE NEGATIVE Final    Comment: (NOTE) The Xpert Xpress SARS-CoV-2/FLU/RSV assay is intended as an aid in  the diagnosis of influenza from Nasopharyngeal swab specimens and  should not be used as a sole basis for treatment. Nasal washings and  aspirates are  unacceptable for Xpert Xpress SARS-CoV-2/FLU/RSV  testing. Fact Sheet for Patients: PinkCheek.be Fact Sheet for Healthcare Providers: GravelBags.it This test is not yet approved or cleared by the [place] FDA and  has been authorized for detection and/or diagnosis of SARS-CoV-2 by  FDA under an Emergency Use Authorization (EUA). This EUA will remain  in effect (meaning this test can be used) for the duration of the  Covid-19 declaration under Section 564(b)(1) of the Act, 21  U.S.C. section 360bbb-3(b)(1), unless the authorization is  terminated or revoked. Performed at [facility], [place] [address], [place], [place] [address]   CSF culture with Stat gram stain     Status: None (Preliminary result)   Collection Time: 03/23/19  6:06 PM   Specimen: CSF; Cerebrospinal Fluid  Result Value Ref Range Status   Specimen Description CSF  Final   Special Requests NONE  Final   Gram Stain   Final    WBC PRESENT, PREDOMINANTLY PMN NO ORGANISMS SEEN CYTOSPIN SMEAR    Culture   Final    NO GROWTH 2 DAYS Performed at [facility], [place] [address], [place], [place] [address]    Report Status PENDING  Incomplete  Culture, blood (single) w Reflex to ID Panel     Status: None (Preliminary result)   Collection Time: 03/24/19  8:37 PM   Specimen: BLOOD RIGHT HAND  Result Value Ref Range Status   Specimen Description BLOOD RIGHT HAND  Final   Special Requests   Final    BOTTLES DRAWN AEROBIC AND ANAEROBIC Blood Culture adequate volume   Culture   Final    NO GROWTH < 12 HOURS Performed at [facility], [place] [address], [place], [place] [address]    Report Status PENDING  Incomplete      Radiology Studies: No results found.   Scheduled Meds: . busPIRone  5 mg Oral BID  . enoxaparin (LOVENOX) injection  40 mg Subcutaneous Q24H  . ketorolac  30 mg Intravenous Once  . levothyroxine  50 mcg Oral QODAY  . levothyroxine  75 mcg Oral QODAY  . lidocaine  1 patch Transdermal Q24H  . loratadine  10 mg Oral Daily  . Melatonin  6 mg Oral QHS  . potassium chloride  40 mEq Oral Daily  . prochlorperazine  10 mg Oral Q8H  . sertraline  100 mg Oral QHS   Continuous Infusions: . acyclovir 166 mL/hr at 03/26/19 0700  . cefTRIAXone (ROCEPHIN)  IV Stopped (03/26/19 6440)  . vancomycin Stopped (03/26/19 0619)     LOS: 3 days    Time spent: 25 minutes spent in the coordination of care today.    Jonnie Finner, DO [facility]  If 7PM-7AM, please contact night-coverage [email or web address] 03/26/2019, 8:51 AM

## 2019-03-27 LAB — RENAL FUNCTION PANEL
Albumin: 2.7 g/dL — ABNORMAL LOW (ref 3.5–5.0)
Anion gap: 10 (ref 5–15)
BUN: 5 mg/dL — ABNORMAL LOW (ref 6–20)
CO2: 24 mmol/L (ref 22–32)
Calcium: 8.5 mg/dL — ABNORMAL LOW (ref 8.9–10.3)
Chloride: 106 mmol/L (ref 98–111)
Creatinine, Ser: 0.56 mg/dL (ref 0.44–1.00)
GFR calc Af Amer: >60 mL/min (ref >60)
GFR calc non Af Amer: >60 mL/min (ref >60)
Glucose, Bld: 103 mg/dL — ABNORMAL HIGH (ref 70–99)
Phosphorus: 3.8 mg/dL (ref 2.5–4.6)
Potassium: 3.9 mmol/L (ref 3.5–5.1)
Sodium: 140 mmol/L (ref 135–145)

## 2019-03-27 LAB — CSF CULTURE W GRAM STAIN: Culture: NO GROWTH

## 2019-03-27 LAB — CBC WITH DIFFERENTIAL/PLATELET
Abs Immature Granulocytes: 0.09 10*3/uL — ABNORMAL HIGH (ref 0.00–0.07)
Basophils Absolute: 0 10*3/uL (ref 0.0–0.1)
Basophils Relative: 0 %
Eosinophils Absolute: 0.2 10*3/uL (ref 0.0–0.5)
Eosinophils Relative: 2 %
HCT: 34.7 % — ABNORMAL LOW (ref 36.0–46.0)
Hemoglobin: 11 g/dL — ABNORMAL LOW (ref 12.0–15.0)
Immature Granulocytes: 1 %
Lymphocytes Relative: 24 %
Lymphs Abs: 1.8 10*3/uL (ref 0.7–4.0)
MCH: 30.9 pg (ref 26.0–34.0)
MCHC: 31.7 g/dL (ref 30.0–36.0)
MCV: 97.5 fL (ref 80.0–100.0)
Monocytes Absolute: 0.9 10*3/uL (ref 0.1–1.0)
Monocytes Relative: 12 %
Neutro Abs: 4.5 10*3/uL (ref 1.7–7.7)
Neutrophils Relative %: 61 %
Platelets: 291 10*3/uL (ref 150–400)
RBC: 3.56 MIL/uL — ABNORMAL LOW (ref 3.87–5.11)
RDW: 13 % (ref 11.5–15.5)
WBC: 7.5 10*3/uL (ref 4.0–10.5)
nRBC: 0 % (ref 0.0–0.2)

## 2019-03-27 NOTE — Progress Notes (Signed)
[name] Kitchen  PROGRESS NOTE    Pria Klosinski  ZMO:[id number] DOB: May 28, 1980 DOA: 03/23/2019 PCP: Jonathon Jordan, MD   Brief Narrative:   Patient presents with 1 day history of fever, headache, neck pain, nausea and vomiting, myalgia, weakness, nausea and vomiting. There is associated photophobia. No chest pain, no shortness of breath, no URI symptoms and no urinary symptoms reported. On presentation to the hospital, T-max was 103.2, with significant tachycardia that peaked at 134 bpm, leukocytosis of 16.6.  03/27/19: Labs look good. Her HA improved some with toradol shot, but she didn't take the ibuprofen after. I've instructed her to continue the NSAIDs. If we can get the HA and N manageable, we can look for d/c in the AM. Stop all antimicrobials today.     Assessment & Plan:   Active Problems:   Meningitis  Meningitis Sepsis - at admission: temp 103.2, HR 134, WBC 16.6; she has also been hypotensive; source presumed menegitis at this time - per previous physician, ED contacted ID; right now I only see neurosurgery contacted - s/p LP - Bld Cx, CSF Cx all negative to date - UCx no growth - continue IV vancomycin, rocephin, acyclovir - pt is s/p dose of IV decadron -afebrile, BP is improving, white count trending down - CXR is negative - CTA Head and Neck is negative - would like to narrow antimicrobials, but still not sure what we are targeting; spoke with ID about her picture, rec'd holding abx and monitor. - still having HA and N; adding toradol and increasing compazine after talking with neuro.     - 03/27/19: HA somewhat improved after toradol; continue NSAIDS, if we can get this and the N under control, look for d/c in AM; d/c all antimicrobials   Hyponatremia Hypokalemia Hypophosphatemia -K+ resolved - Mg2+ is ok - Na+ resolved - Phos resolved  Volume depletion Metabolic acidosis  -continueIVF, but change to half normal for today for 1L - volume has improved; encourage PO intake     - 03/27/19: Doing ok this AM volume-wise; follow I7O  Hypothyroidism - on levothyroxine at home(23mg one day, 768m the next in alternating fashion); continue here  Anxiety/depression - home regimen: buspar, zoloft - buspar has been resumed  DVT prophylaxis: lovenox Code Status: FULL Family Communication: None at bedside   Disposition Plan: To home with Sx have improved  Procedures:   LP  ROS:  Reports HA, N/V. Denies dyspnea, CP, ab pain . Remainder 10-pt ROS is negative for all not previously mentioned.  Subjective: "It got a little better."  Objective: Vitals:   03/26/19 1442 03/26/19 2221 03/27/19 0347 03/27/19 1430  BP: 127/71 118/81 102/61 (!) 106/53  Pulse: 84 84 67 66  Resp: '19 17 15 14  '$ Temp: 98 F (36.7 C) 99.3 F (37.4 C) 98.4 F (36.9 C) 98.9 F (37.2 C)  TempSrc: Oral Oral Oral Oral  SpO2: 100% 100% 100% 99%  Weight:      Height:        Intake/Output Summary (Last 24 hours) at 03/27/2019 1449 Last data filed at 03/27/2019 1000 Gross per 24 hour  Intake 1035 ml  Output -  Net 1035 ml   Filed Weights   03/23/19 1702  Weight: 78 kg    Examination:  General: 89.o. female resting in bed in NAD Cardiovascular: RRR, +S1, S2, no m/g/r Respiratory: CTABL, no w/r/r, normal WOB GI: BS+, NDNT, soft MSK: No e/c/c Neuro: A&O x 3, no focal deficits Psyc: calm/cooperative  Data Reviewed: I have personally reviewed following labs and imaging studies.  CBC: Recent Labs  Lab 03/23/19 1437 03/23/19 1437 03/23/19 2130 03/24/19 0105 03/25/19 0134 03/26/19 0249 03/27/19 0754  WBC 16.6*   < > 17.9* 18.5* 14.4* 8.0 7.5  NEUTROABS 14.6*  --   --   --  10.9* 5.0 4.5  HGB 12.5   < > 12.1 12.1 10.2* 10.8* 11.0*  HCT 37.8   < > 38.2 37.2 32.3* 33.8* 34.7*  MCV 95.5   < > 100.3* 96.4 98.2 98.0 97.5  PLT 276   < > 298 255 260  269 291   < > = values in this interval not displayed.   Basic Metabolic Panel: Recent Labs  Lab 03/23/19 1437 03/23/19 1437 03/23/19 2130 03/24/19 0105 03/25/19 0134 03/26/19 0249 03/27/19 0754  NA 134*  --   --  139 142 138 140  K 3.4*  --   --  3.4* 3.4* 3.6 3.9  CL 102  --   --  110 113* 106 106  CO2 21*  --   --  18* 20* 20* 24  GLUCOSE 126*  --   --  182* 127* 91 103*  BUN 7  --   --  6 8 5* 5*  CREATININE 0.77   < > 0.69 0.65 0.63 0.58 0.56  CALCIUM 8.8*  --   --  7.8* 7.9* 8.1* 8.5*  MG  --   --  1.8  --  1.8 1.9  --   PHOS  --   --  2.9  --  2.2* 4.8* 3.8   < > = values in this interval not displayed.   GFR: Estimated Creatinine Clearance: 98.2 mL/min (by C-G formula based on SCr of 0.56 mg/dL). Liver Function Tests: Recent Labs  Lab 03/23/19 1437 03/25/19 0134 03/26/19 0249 03/27/19 0754  AST 19 12*  --   --   ALT 15 15  --   --   ALKPHOS 52 38  --   --   BILITOT 1.2 <0.1*  --   --   PROT 6.5 5.4*  --   --   ALBUMIN 3.6 2.8* 2.8* 2.7*   No results for input(s): LIPASE, AMYLASE in the last 168 hours. No results for input(s): AMMONIA in the last 168 hours. Coagulation Profile: Recent Labs  Lab 03/23/19 1702  INR 1.1   Cardiac Enzymes: No results for input(s): CKTOTAL, CKMB, CKMBINDEX, TROPONINI in the last 168 hours. BNP (last 3 results) No results for input(s): PROBNP in the last 8760 hours. HbA1C: No results for input(s): HGBA1C in the last 72 hours. CBG: No results for input(s): GLUCAP in the last 168 hours. Lipid Profile: No results for input(s): CHOL, HDL, LDLCALC, TRIG, CHOLHDL, LDLDIRECT in the last 72 hours. Thyroid Function Tests: No results for input(s): TSH, T4TOTAL, FREET4, T3FREE, THYROIDAB in the last 72 hours. Anemia Panel: No results for input(s): VITAMINB12, FOLATE, FERRITIN, TIBC, IRON, RETICCTPCT in the last 72 hours. Sepsis Labs: Recent Labs  Lab 03/23/19 1437 03/23/19 1702 03/24/19 0105  LATICACIDVEN 0.8 1.9 0.6     Recent Results (from the past 240 hour(s))  Novel Coronavirus, NAA (Hosp order, Send-out to Ref Lab; TAT 18-24 hrs     Status: None   Collection Time: 03/23/19  2:55 PM   Specimen: Flu Kit Nasopharyngeal Swab; Respiratory  Result Value Ref Range Status   SARS-CoV-2, NAA NOT DETECTED NOT DETECTED Final    Comment: (NOTE) This nucleic acid amplification  test was developed and its performance characteristics determined by [employer]. Nucleic acid amplification tests include RT-PCR and TMA. This test has not been FDA cleared or approved. This test has been authorized by FDA under an Emergency Use Authorization (EUA). This test is only authorized for the duration of time the declaration that circumstances exist justifying the authorization of the emergency use of in vitro diagnostic tests for detection of SARS-CoV-2 virus and/or diagnosis of COVID-19 infection under section 564(b)(1) of the Act, 21 U.S.C. 283TDV-7(O) (1), unless the authorization is terminated or revoked sooner. When diagnostic testing is negative, the possibility of a false negative result should be considered in the context of a patient's recent exposures and the presence of clinical signs and symptoms consistent with COVID-19. An individual without symptoms of COVID- 19 and who is not shedding SARS-CoV-2  virus would expect to have a negative (not detected) result in this assay. Performed At: [facility] [address] [place], [place] [id number] Rush Farmer MD YI:[id number]    [place]  Final    Comment: Performed at [facility], [place] [address] [place], [place] [address]  Urine culture     Status: None   Collection Time: 03/23/19  3:26 PM   Specimen: In/Out Cath Urine  Result Value Ref Range Status   Specimen Description IN/OUT CATH URINE  Final   Special Requests NONE  Final   Culture   Final    NO GROWTH Performed at [facility], [place] [address], [place], [place] [address]    Report Status 03/24/2019 FINAL  Final  Blood Culture (routine x 2)     Status: None (Preliminary result)   Collection Time: 03/23/19  5:02 PM   Specimen: BLOOD  Result Value Ref Range Status   Specimen Description BLOOD RIGHT ANTECUBITAL  Final   Special Requests   Final    BOTTLES DRAWN AEROBIC ONLY Blood Culture results may not be optimal due to an inadequate volume of blood received in culture bottles   Culture   Final    NO GROWTH 4 DAYS Performed at [facility], [place] [address], [place], [place] [address]    Report Status PENDING  Incomplete  Respiratory Panel by RT PCR (Flu A&B, Covid) - Nasopharyngeal Swab     Status: None   Collection Time: 03/23/19  5:43 PM   Specimen: Nasopharyngeal Swab  Result Value Ref Range Status   SARS Coronavirus 2 by RT PCR NEGATIVE NEGATIVE Final    Comment: (NOTE) SARS-CoV-2 target nucleic acids are NOT DETECTED. The SARS-CoV-2 RNA is generally detectable in upper respiratoy specimens during the acute phase of infection. The lowest concentration of SARS-CoV-2 viral copies this assay can detect is 131 copies/mL. A negative result does not preclude SARS-Cov-2 infection and should not be used as the sole basis for treatment or other patient management decisions. A negative result may occur with  improper specimen collection/handling, submission of specimen other than nasopharyngeal swab, presence of viral mutation(s) within the areas targeted by this assay, and inadequate number of viral copies (<131 copies/mL). A negative result must be combined with clinical observations, patient history, and epidemiological information. The expected result is Negative. Fact Sheet for Patients:  PinkCheek.be Fact Sheet for Healthcare Providers:  GravelBags.it This test is not yet ap proved or cleared by the [place] FDA and  has been authorized for detection  and/or diagnosis of SARS-CoV-2 by FDA under an Emergency Use Authorization (EUA). This  EUA will remain  in effect (meaning this test can be used) for the duration of the COVID-19 declaration under Section 564(b)(1) of the Act, 21 U.S.C. section 360bbb-3(b)(1), unless the authorization is terminated or revoked sooner.    Influenza A by PCR NEGATIVE NEGATIVE Final   Influenza B by PCR NEGATIVE NEGATIVE Final    Comment: (NOTE) The Xpert Xpress SARS-CoV-2/FLU/RSV assay is intended as an aid in  the diagnosis of influenza from Nasopharyngeal swab specimens and  should not be used as a sole basis for treatment. Nasal washings and  aspirates are unacceptable for Xpert Xpress SARS-CoV-2/FLU/RSV  testing. Fact Sheet for Patients: PinkCheek.be Fact Sheet for Healthcare Providers: GravelBags.it This test is not yet approved or cleared by the [place] FDA and  has been authorized for detection and/or diagnosis of SARS-CoV-2 by  FDA under an Emergency Use Authorization (EUA). This EUA will remain  in effect (meaning this test can be used) for the duration of the  Covid-19 declaration under Section 564(b)(1) of the Act, 21  U.S.C. section 360bbb-3(b)(1), unless the authorization is  terminated or revoked. Performed at [facility], [place] [address], [place], [place] [address]   CSF culture with Stat gram stain     Status: None   Collection Time: 03/23/19  6:06 PM   Specimen: CSF; Cerebrospinal Fluid  Result Value Ref Range Status   Specimen Description CSF  Final   Special Requests NONE  Final   Gram Stain   Final    WBC PRESENT, PREDOMINANTLY PMN NO ORGANISMS SEEN CYTOSPIN SMEAR    Culture   Final    NO GROWTH Performed at [facility], [place] [address], [place], [place] [address]    Report Status 03/27/2019 FINAL  Final  Culture, blood (single) w Reflex to ID Panel     Status: None (Preliminary result)    Collection Time: 03/24/19  8:37 PM   Specimen: BLOOD RIGHT HAND  Result Value Ref Range Status   Specimen Description BLOOD RIGHT HAND  Final   Special Requests   Final    BOTTLES DRAWN AEROBIC AND ANAEROBIC Blood Culture adequate volume   Culture   Final    NO GROWTH 3 DAYS Performed at [facility], [place] [address], [place], [place] [address]    Report Status PENDING  Incomplete      Radiology Studies: No results found.   Scheduled Meds: . busPIRone  5 mg Oral BID  . enoxaparin (LOVENOX) injection  40 mg Subcutaneous Q24H  . levothyroxine  50 mcg Oral QODAY  . levothyroxine  75 mcg Oral QODAY  . lidocaine  1 patch Transdermal Q24H  . loratadine  10 mg Oral Daily  . Melatonin  6 mg Oral QHS  . potassium chloride  40 mEq Oral Daily  . prochlorperazine  10 mg Oral Q8H  . sertraline  100 mg Oral QHS   Continuous Infusions:   LOS: 4 days    Time spent: 25 minutes spent in the coordination of care today.    Jonnie Finner, DO [facility]  If 7PM-7AM, please contact night-coverage [email or web address] 03/27/2019, 2:49 PM

## 2019-03-28 DIAGNOSIS — G030 Nonpyogenic meningitis: Secondary | ICD-10-CM

## 2019-03-28 LAB — CBC WITH DIFFERENTIAL/PLATELET
Abs Immature Granulocytes: 0.2 10*3/uL — ABNORMAL HIGH (ref 0.00–0.07)
Basophils Absolute: 0.1 10*3/uL (ref 0.0–0.1)
Basophils Relative: 1 %
Eosinophils Absolute: 0.2 10*3/uL (ref 0.0–0.5)
Eosinophils Relative: 2 %
HCT: 36.1 % (ref 36.0–46.0)
Hemoglobin: 11.6 g/dL — ABNORMAL LOW (ref 12.0–15.0)
Immature Granulocytes: 2 %
Lymphocytes Relative: 18 %
Lymphs Abs: 1.8 10*3/uL (ref 0.7–4.0)
MCH: 31.1 pg (ref 26.0–34.0)
MCHC: 32.1 g/dL (ref 30.0–36.0)
MCV: 96.8 fL (ref 80.0–100.0)
Monocytes Absolute: 1.2 10*3/uL — ABNORMAL HIGH (ref 0.1–1.0)
Monocytes Relative: 12 %
Neutro Abs: 6.6 10*3/uL (ref 1.7–7.7)
Neutrophils Relative %: 65 %
Platelets: 319 10*3/uL (ref 150–400)
RBC: 3.73 MIL/uL — ABNORMAL LOW (ref 3.87–5.11)
RDW: 12.8 % (ref 11.5–15.5)
WBC: 10.1 10*3/uL (ref 4.0–10.5)
nRBC: 0 % (ref 0.0–0.2)

## 2019-03-28 LAB — RENAL FUNCTION PANEL
Albumin: 2.8 g/dL — ABNORMAL LOW (ref 3.5–5.0)
Anion gap: 10 (ref 5–15)
BUN: 7 mg/dL (ref 6–20)
CO2: 22 mmol/L (ref 22–32)
Calcium: 9 mg/dL (ref 8.9–10.3)
Chloride: 107 mmol/L (ref 98–111)
Creatinine, Ser: 0.55 mg/dL (ref 0.44–1.00)
GFR calc Af Amer: >60 mL/min (ref >60)
GFR calc non Af Amer: >60 mL/min (ref >60)
Glucose, Bld: 108 mg/dL — ABNORMAL HIGH (ref 70–99)
Phosphorus: 4 mg/dL (ref 2.5–4.6)
Potassium: 4.3 mmol/L (ref 3.5–5.1)
Sodium: 139 mmol/L (ref 135–145)

## 2019-03-28 LAB — CULTURE, BLOOD (ROUTINE X 2): Culture: NO GROWTH

## 2019-03-28 MED ORDER — PROCHLORPERAZINE MALEATE 10 MG PO TABS: 10.0000 mg | ORAL_TABLET | Freq: Three times a day (TID) | ORAL | 0 refills | Status: DC

## 2019-03-28 MED ORDER — KETOROLAC TROMETHAMINE 10 MG PO TABS: 10.0000 mg | ORAL_TABLET | Freq: Four times a day (QID) | ORAL | 0 refills | Status: AC | PRN

## 2019-03-28 NOTE — Progress Notes (Signed)
Megan Lara to be D/C'd home per MD order.  Discussed prescriptions and follow up appointments with the patient. Prescriptions sent to patient's preferred pharmacy, medication list explained in detail. Pt verbalized understanding.  Allergies as of 03/28/2019   No Known Allergies     Medication List    STOP taking these medications   ibuprofen 200 MG tablet Commonly known as: ADVIL   ibuprofen 800 MG tablet Commonly known as: ADVIL   lidocaine 2 % solution Commonly known as: XYLOCAINE     TAKE these medications   acetaminophen 500 MG tablet Commonly known as: TYLENOL Take 1,000 mg by mouth every 6 (six) hours as needed for mild pain, moderate pain or headache.   albuterol 108 (90 Base) MCG/ACT inhaler Commonly known as: VENTOLIN HFA Inhale 2 puffs into the lungs every 6 (six) hours as needed for wheezing.   busPIRone 5 MG tablet Commonly known as: BUSPAR Take 5 mg by mouth 2 (two) times daily.   cetirizine 10 MG tablet Commonly known as: ZYRTEC Take 10 mg by mouth at bedtime.   ipratropium 0.06 % nasal spray Commonly known as: ATROVENT Place 2 sprays into both nostrils 4 (four) times daily.   ketorolac 10 MG tablet Commonly known as: TORADOL Take 1 tablet (10 mg total) by mouth every 6 (six) hours as needed for up to 3 days.   levothyroxine 75 MCG tablet Commonly known as: SYNTHROID Take 75 mcg by mouth every other day.   levothyroxine 50 MCG tablet Commonly known as: SYNTHROID Take 50 mcg by mouth every other day.   prochlorperazine 10 MG tablet Commonly known as: COMPAZINE Take 1 tablet (10 mg total) by mouth every 8 (eight) hours.   sertraline 100 MG tablet Commonly known as: ZOLOFT Take 100 mg by mouth at bedtime.       Vitals:   03/27/19 2056 03/28/19 0622  BP: 115/69 113/70  Pulse: 65 79  Resp: 16 16  Temp: 98.5 F (36.9 C) 98.6 F (37 C)  SpO2: 100% 100%    Skin clean, dry and intact without evidence of skin break down, no  evidence of skin tears noted. IV catheter discontinued intact. Site without signs and symptoms of complications. Dressing and pressure applied. Pt denies pain at this time. No complaints noted.  An After Visit Summary was printed and given to the patient. Patient escorted via WC, and D/C home via private auto.  Megan Lara [facility] 03/28/2019 2:06 PM

## 2019-03-28 NOTE — Discharge Summary (Signed)
. Physician Discharge Summary  Megan Lara ZOX:[id number] DOB: 10-12-1980 DOA: 03/23/2019  PCP: Jonathon Jordan, MD  Admit date: 03/23/2019 Discharge date: 03/28/2019  Admitted From: Home  Disposition:  Discharged to home.   Recommendations for Outpatient Follow-up:  1. Follow up with PCP in 1 weeks 2. Please obtain BMP/CBC in one week  Discharge Condition: Stable  CODE STATUS: FULL   Brief/Interim Summary: Patient presents with 1 day history of fever, headache, neck pain, nausea and vomiting, myalgia, weakness, nausea and vomiting. There is associated photophobia. No chest pain, no shortness of breath, no URI symptoms and no urinary symptoms reported. On presentation to the hospital, T-max was 103.2, with significant tachycardia that peaked at 134 bpm, leukocytosis of 16.6.  03/28/19: HA improved today but not yet resolved. N improved. I think she is ok to continue course at home. Will send her with 3 days of toradol and some compazine. She will need to follow up with her PCP in the next week. Instructions have been given and she voices understanding. Will leave work excuse for 5 days in chart.   Discharge Diagnoses:  Active Problems:   Meningitis  Meningitis Sepsis - at admission: temp 103.2, HR 134, WBC 16.6; she has also been hypotensive; source presumed menegitis at this time - per previous physician, ED contacted ID; right now I only see neurosurgery contacted - s/p LP - Bld Cx, CSF Cx all negative to date - UCx no growth - continue IV vancomycin, rocephin, acyclovir - pt is s/p dose of IV decadron -afebrile, BP is improving, white count trending down - CXR is negative - CTA Head and Neck is negative - would like to narrow antimicrobials, but still not sure what we are targeting; spoke with ID about her picture, rec'd holding abx and monitor. -still having HA and N; adding toradol and increasing compazine after  talking with neuro.     - 03/27/19: HA somewhat improved after toradol; continue NSAIDS, if we can get this and the N under control, look for d/c in AM; d/c all antimicrobials     - 03/28/19: HA is improved, but not resolved. N is improved. Will send home with toradol for next 3 days and compazine. She will remain out of work for the week. Needs follow up with PCP in next week.   Hyponatremia Hypokalemia Hypophosphatemia -K+resolved - Mg2+ is ok - Na+ resolved -Phos resolved  Volume depletion Metabolic acidosis -continueIVF, but change to half normal for today for 1L - volume has improved; encourage PO intake     - 03/27/19: Doing ok this AM volume-wise; follow I&O     - 03/28/19: Resolved  Hypothyroidism - on levothyroxine at home(41mg one day, 733m the next in alternating fashion); continue here  Anxiety/depression - home regimen: buspar, zoloft - buspar has been resumed  Discharge Instructions   Allergies as of 03/28/2019   No Known Allergies     Medication List    STOP taking these medications   ibuprofen 200 MG tablet Commonly known as: ADVIL   ibuprofen 800 MG tablet Commonly known as: ADVIL   lidocaine 2 % solution Commonly known as: XYLOCAINE     TAKE these medications   acetaminophen 500 MG tablet Commonly known as: TYLENOL Take 1,000 mg by mouth every 6 (six) hours as needed for mild pain, moderate pain or headache.   albuterol 108 (90 Base) MCG/ACT inhaler Commonly known as: VENTOLIN HFA Inhale 2 puffs into the lungs every 6 (six) hours as  needed for wheezing.   busPIRone 5 MG tablet Commonly known as: BUSPAR Take 5 mg by mouth 2 (two) times daily.   cetirizine 10 MG tablet Commonly known as: ZYRTEC Take 10 mg by mouth at bedtime.   ipratropium 0.06 % nasal spray Commonly known as: ATROVENT Place 2 sprays into both nostrils 4 (four) times daily.   ketorolac 10 MG tablet Commonly known as:  TORADOL Take 1 tablet (10 mg total) by mouth every 6 (six) hours as needed for up to 3 days.   levothyroxine 75 MCG tablet Commonly known as: SYNTHROID Take 75 mcg by mouth every other day.   levothyroxine 50 MCG tablet Commonly known as: SYNTHROID Take 50 mcg by mouth every other day.   prochlorperazine 10 MG tablet Commonly known as: COMPAZINE Take 1 tablet (10 mg total) by mouth every 8 (eight) hours.   sertraline 100 MG tablet Commonly known as: ZOLOFT Take 100 mg by mouth at bedtime.       No Known Allergies   Procedures/Studies: CT Angio Head W or Wo Contrast  Result Date: 03/23/2019 CLINICAL DATA:  Fever and headache. EXAM: CT ANGIOGRAPHY HEAD AND NECK TECHNIQUE: Multidetector CT imaging of the head and neck was performed using the standard protocol during bolus administration of intravenous contrast. Multiplanar CT image reconstructions and MIPs were obtained to evaluate the vascular anatomy. Carotid stenosis measurements (when applicable) are obtained utilizing NASCET criteria, using the distal internal carotid diameter as the denominator. CONTRAST:  57m OMNIPAQUE IOHEXOL 350 MG/ML SOLN COMPARISON:  CT head 03/23/2019 FINDINGS: CTA NECK FINDINGS Aortic arch: Standard branching. Imaged portion shows no evidence of aneurysm or dissection. No significant stenosis of the major arch vessel origins. Right carotid system: Normal right carotid. Negative for stenosis or dissection. No significant atherosclerotic disease. Left carotid system: Normal left carotid. Negative for stenosis or dissection. No significant atherosclerotic disease. Vertebral arteries: Normal vertebral arteries bilaterally Skeleton: Negative Other neck: Negative Upper chest: Lung apices clear bilaterally. Review of the MIP images confirms the above findings CTA HEAD FINDINGS Anterior circulation: Cavernous carotid normal bilaterally. Anterior and middle cerebral arteries patent bilaterally without stenosis. Negative  for aneurysm. Posterior circulation: Both vertebral arteries patent to the basilar. PICA patent bilaterally. Basilar widely patent. Superior cerebellar and posterior cerebral arteries patent bilaterally. Fetal origin right posterior cerebral artery. Negative for aneurysm. Venous sinuses: Negative Anatomic variants: None Review of the MIP images confirms the above findings IMPRESSION: Negative CTA head and neck. No significant stenosis or large vessel occlusion. Negative for aneurysm or dissection. Electronically Signed   By: CFranchot GalloM.D.   On: 03/23/2019 21:16   CT Head Wo Contrast  Result Date: 03/23/2019 CLINICAL DATA:  Headache. EXAM: CT HEAD WITHOUT CONTRAST TECHNIQUE: Contiguous axial images were obtained from the base of the skull through the vertex without intravenous contrast. COMPARISON:  October 11, 2015 FINDINGS: Brain: No evidence of acute infarction, hemorrhage, hydrocephalus, extra-axial collection or mass lesion/mass effect. Vascular: No hyperdense vessel or unexpected calcification. Skull: Normal. Negative for fracture or focal lesion. Sinuses/Orbits: No acute finding. Other: The metallic density foreign body seen within the subcutaneous tissues adjacent to the anterior aspect of the frontal sinus on the left on the prior study is no longer present. IMPRESSION: No acute intracranial pathology. Electronically Signed   By: TVirgina NorfolkM.D.   On: 03/23/2019 17:35   CT Angio Neck W and/or Wo Contrast  Result Date: 03/23/2019 CLINICAL DATA:  Fever and headache. EXAM: CT ANGIOGRAPHY HEAD AND NECK  TECHNIQUE: Multidetector CT imaging of the head and neck was performed using the standard protocol during bolus administration of intravenous contrast. Multiplanar CT image reconstructions and MIPs were obtained to evaluate the vascular anatomy. Carotid stenosis measurements (when applicable) are obtained utilizing NASCET criteria, using the distal internal carotid diameter as the denominator.  CONTRAST:  63m OMNIPAQUE IOHEXOL 350 MG/ML SOLN COMPARISON:  CT head 03/23/2019 FINDINGS: CTA NECK FINDINGS Aortic arch: Standard branching. Imaged portion shows no evidence of aneurysm or dissection. No significant stenosis of the major arch vessel origins. Right carotid system: Normal right carotid. Negative for stenosis or dissection. No significant atherosclerotic disease. Left carotid system: Normal left carotid. Negative for stenosis or dissection. No significant atherosclerotic disease. Vertebral arteries: Normal vertebral arteries bilaterally Skeleton: Negative Other neck: Negative Upper chest: Lung apices clear bilaterally. Review of the MIP images confirms the above findings CTA HEAD FINDINGS Anterior circulation: Cavernous carotid normal bilaterally. Anterior and middle cerebral arteries patent bilaterally without stenosis. Negative for aneurysm. Posterior circulation: Both vertebral arteries patent to the basilar. PICA patent bilaterally. Basilar widely patent. Superior cerebellar and posterior cerebral arteries patent bilaterally. Fetal origin right posterior cerebral artery. Negative for aneurysm. Venous sinuses: Negative Anatomic variants: None Review of the MIP images confirms the above findings IMPRESSION: Negative CTA head and neck. No significant stenosis or large vessel occlusion. Negative for aneurysm or dissection. Electronically Signed   By: CFranchot GalloM.D.   On: 03/23/2019 21:16   DG Chest Portable 1 View  Result Date: 03/23/2019 CLINICAL DATA:  Fever and short of breath EXAM: PORTABLE CHEST 1 VIEW COMPARISON:  09/12/2012 FINDINGS: The heart size and mediastinal contours are within normal limits. Both lungs are clear. The visualized skeletal structures are unremarkable. IMPRESSION: No active disease. Electronically Signed   By: CFranchot GalloM.D.   On: 03/23/2019 14:06    Subjective: "I still have it but it's better. I'm eating."  Discharge Exam: Vitals:   03/27/19 2056  03/28/19 0622  BP: 115/69 113/70  Pulse: 65 79  Resp: 16 16  Temp: 98.5 F (36.9 C) 98.6 F (37 C)  SpO2: 100% 100%   Vitals:   03/27/19 0347 03/27/19 1430 03/27/19 2056 03/28/19 0622  BP: 102/61 (!) 106/53 115/69 113/70  Pulse: 67 66 65 79  Resp: _0 Temp: 98.4 F (36.9 C) 98.9 F (37.2 C) 98.5 F (36.9 C) 98.6 F (37 C)  TempSrc: Oral Oral Oral Oral  SpO2: 100% 99% 100% 100%  Weight:      Height:        General: 39y.o. female resting in bed in NAD Cardiovascular: RRR, +S1, S2, no m/g/r, equal pulses throughout Respiratory: CTABL, no w/r/r, normal WOB GI: BS+, NDNT, no masses noted, no organomegaly noted MSK: No e/c/c Skin: No rashes, bruises, ulcerations noted Neuro: A&O x 3, no focal deficits Psyc: Appropriate interaction and affect, calm/cooperative   The results of significant diagnostics from this hospitalization (including imaging, microbiology, ancillary and laboratory) are listed below for reference.     Microbiology: Recent Results (from the past 240 hour(s))  Novel Coronavirus, NAA (Hosp order, Send-out to Ref Lab; TAT 18-24 hrs     Status: None   Collection Time: 03/23/19  2:55 PM   Specimen: Flu Kit Nasopharyngeal Swab; Respiratory  Result Value Ref Range Status   SARS-CoV-2, NAA NOT DETECTED NOT DETECTED Final    Comment: (NOTE) This nucleic acid amplification test was developed and its performance characteristics determined by [employer]  Nucleic acid amplification tests include RT-PCR and TMA. This test has not been FDA cleared or approved. This test has been authorized by FDA under an Emergency Use Authorization (EUA). This test is only authorized for the duration of time the declaration that circumstances exist justifying the authorization of the emergency use of in vitro diagnostic tests for detection of SARS-CoV-2 virus and/or diagnosis of COVID-19 infection under section 564(b)(1) of the Act, 21 U.S.C. 177LTJ-0(Z) (1),  unless the authorization is terminated or revoked sooner. When diagnostic testing is negative, the possibility of a false negative result should be considered in the context of a patient's recent exposures and the presence of clinical signs and symptoms consistent with COVID-19. An individual without symptoms of COVID- 19 and who is not shedding SARS-CoV-2  virus would expect to have a negative (not detected) result in this assay. Performed At: [facility] [address] [place], [place] [id number] Rush Farmer MD MA:[id number]    [place]  Final    Comment: Performed at [facility], [place] [address] [place], [place] [address]  Urine culture     Status: None   Collection Time: 03/23/19  3:26 PM   Specimen: In/Out Cath Urine  Result Value Ref Range Status   Specimen Description IN/OUT CATH URINE  Final   Special Requests NONE  Final   Culture   Final    NO GROWTH Performed at [facility], [place] [address] [place], [place] [address]    Report Status 03/24/2019 FINAL  Final  Blood Culture (routine x 2)     Status: None (Preliminary result)   Collection Time: 03/23/19  5:02 PM   Specimen: BLOOD  Result Value Ref Range Status   Specimen Description BLOOD RIGHT ANTECUBITAL  Final   Special Requests   Final    BOTTLES DRAWN AEROBIC ONLY Blood Culture results may not be optimal due to an inadequate volume of blood received in culture bottles   Culture   Final    NO GROWTH 4 DAYS Performed at [facility], [place] [address], [place], [place] [address]    Report Status PENDING  Incomplete  Respiratory Panel by RT PCR (Flu A&B, Covid) - Nasopharyngeal Swab     Status: None   Collection Time: 03/23/19  5:43 PM   Specimen: Nasopharyngeal Swab  Result Value Ref Range Status   SARS Coronavirus 2 by RT PCR NEGATIVE NEGATIVE Final    Comment: (NOTE) SARS-CoV-2 target nucleic acids are NOT DETECTED. The SARS-CoV-2 RNA is generally  detectable in upper respiratoy specimens during the acute phase of infection. The lowest concentration of SARS-CoV-2 viral copies this assay can detect is 131 copies/mL. A negative result does not preclude SARS-Cov-2 infection and should not be used as the sole basis for treatment or other patient management decisions. A negative result may occur with  improper specimen collection/handling, submission of specimen other than nasopharyngeal swab, presence of viral mutation(s) within the areas targeted by this assay, and inadequate number of viral copies (<131 copies/mL). A negative result must be combined with clinical observations, patient history, and epidemiological information. The expected result is Negative. Fact Sheet for Patients:  PinkCheek.be Fact Sheet for Healthcare Providers:  GravelBags.it This test is not yet ap proved or cleared by the [place] FDA and  has been authorized for detection and/or diagnosis of SARS-CoV-2 by FDA under an Emergency Use Authorization (EUA). This EUA will remain  in effect (meaning this test can be  used) for the duration of the COVID-19 declaration under Section 564(b)(1) of the Act, 21 U.S.C. section 360bbb-3(b)(1), unless the authorization is terminated or revoked sooner.    Influenza A by PCR NEGATIVE NEGATIVE Final   Influenza B by PCR NEGATIVE NEGATIVE Final    Comment: (NOTE) The Xpert Xpress SARS-CoV-2/FLU/RSV assay is intended as an aid in  the diagnosis of influenza from Nasopharyngeal swab specimens and  should not be used as a sole basis for treatment. Nasal washings and  aspirates are unacceptable for Xpert Xpress SARS-CoV-2/FLU/RSV  testing. Fact Sheet for Patients: PinkCheek.be Fact Sheet for Healthcare Providers: GravelBags.it This test is not yet approved or cleared by the [place] FDA and  has been  authorized for detection and/or diagnosis of SARS-CoV-2 by  FDA under an Emergency Use Authorization (EUA). This EUA will remain  in effect (meaning this test can be used) for the duration of the  Covid-19 declaration under Section 564(b)(1) of the Act, 21  U.S.C. section 360bbb-3(b)(1), unless the authorization is  terminated or revoked. Performed at [facility], [place] [address], [place], [place] [address]   CSF culture with Stat gram stain     Status: None   Collection Time: 03/23/19  6:06 PM   Specimen: CSF; Cerebrospinal Fluid  Result Value Ref Range Status   Specimen Description CSF  Final   Special Requests NONE  Final   Gram Stain   Final    WBC PRESENT, PREDOMINANTLY PMN NO ORGANISMS SEEN CYTOSPIN SMEAR    Culture   Final    NO GROWTH Performed at [facility], [place] [address] [place], [place] [address]    Report Status 03/27/2019 FINAL  Final  Culture, blood (single) w Reflex to ID Panel     Status: None (Preliminary result)   Collection Time: 03/24/19  8:37 PM   Specimen: BLOOD RIGHT HAND  Result Value Ref Range Status   Specimen Description BLOOD RIGHT HAND  Final   Special Requests   Final    BOTTLES DRAWN AEROBIC AND ANAEROBIC Blood Culture adequate volume   Culture   Final    NO GROWTH 3 DAYS Performed at [facility], [place] [address] [place], [place] [address]    Report Status PENDING  Incomplete     Labs: BNP (last 3 results) No results for input(s): BNP in the last 8760 hours. Basic Metabolic Panel: Recent Labs  Lab 03/23/19 1437 03/23/19 2130 03/24/19 0105 03/25/19 0134 03/26/19 0249 03/27/19 0754 03/28/19 0504  NA   < >  --  139 142 138 140 139  K   < >  --  3.4* 3.4* 3.6 3.9 4.3  CL   < >  --  110 113* 106 106 107  CO2   < >  --  18* 20* 20* 24 22  GLUCOSE   < >  --  182* 127* 91 103* 108*  BUN   < >  --  6 8 5* 5* 7  CREATININE   < > 0.69 0.65 0.63 0.58 0.56 0.55  CALCIUM   < >  --  7.8* 7.9* 8.1* 8.5* 9.0  MG  --  1.8   --  1.8 1.9  --   --   PHOS  --  2.9  --  2.2* 4.8* 3.8 4.0   < > = values in this interval not displayed.   Liver Function Tests: Recent Labs  Lab 03/23/19 1437 03/25/19 0134 03/26/19 0249  03/27/19 0754 03/28/19 0504  AST 19 12*  --   --   --   ALT 15 15  --   --   --   ALKPHOS 52 38  --   --   --   BILITOT 1.2 <0.1*  --   --   --   PROT 6.5 5.4*  --   --   --   ALBUMIN 3.6 2.8* 2.8* 2.7* 2.8*   No results for input(s): LIPASE, AMYLASE in the last 168 hours. No results for input(s): AMMONIA in the last 168 hours. CBC: Recent Labs  Lab 03/23/19 1437 03/23/19 2130 03/24/19 0105 03/25/19 0134 03/26/19 0249 03/27/19 0754 03/28/19 0504  WBC 16.6*   < > 18.5* 14.4* 8.0 7.5 10.1  NEUTROABS 14.6*  --   --  10.9* 5.0 4.5 6.6  HGB 12.5   < > 12.1 10.2* 10.8* 11.0* 11.6*  HCT 37.8   < > 37.2 32.3* 33.8* 34.7* 36.1  MCV 95.5   < > 96.4 98.2 98.0 97.5 96.8  PLT 276   < > 255 260 269 291 319   < > = values in this interval not displayed.   Cardiac Enzymes: No results for input(s): CKTOTAL, CKMB, CKMBINDEX, TROPONINI in the last 168 hours. BNP: Invalid input(s): POCBNP CBG: No results for input(s): GLUCAP in the last 168 hours. D-Dimer No results for input(s): DDIMER in the last 72 hours. Hgb A1c No results for input(s): HGBA1C in the last 72 hours. Lipid Profile No results for input(s): CHOL, HDL, LDLCALC, TRIG, CHOLHDL, LDLDIRECT in the last 72 hours. Thyroid function studies No results for input(s): TSH, T4TOTAL, T3FREE, THYROIDAB in the last 72 hours.  Invalid input(s): FREET3 Anemia work up No results for input(s): VITAMINB12, FOLATE, FERRITIN, TIBC, IRON, RETICCTPCT in the last 72 hours. Urinalysis    Component Value Date/Time   COLORURINE YELLOW 03/23/2019 1526   APPEARANCEUR HAZY (A) 03/23/2019 1526   LABSPEC 1.010 03/23/2019 1526   PHURINE 6.0 03/23/2019 1526   GLUCOSEU NEGATIVE 03/23/2019 1526   HGBUR SMALL (A) 03/23/2019 [place]  03/23/2019 1526   BILIRUBINUR N 04/12/2014 1437   KETONESUR 5 (A) 03/23/2019 1526   PROTEINUR NEGATIVE 03/23/2019 1526   UROBILINOGEN negative 04/12/2014 1437   UROBILINOGEN 0.2 09/12/2012 1714   NITRITE NEGATIVE 03/23/2019 [place] 03/23/2019 1526   Sepsis Labs Invalid input(s): PROCALCITONIN,  WBC,  LACTICIDVEN Microbiology Recent Results (from the past 240 hour(s))  Novel Coronavirus, NAA (Hosp order, Send-out to Ref Lab; TAT 18-24 hrs     Status: None   Collection Time: 03/23/19  2:55 PM   Specimen: Flu Kit Nasopharyngeal Swab; Respiratory  Result Value Ref Range Status   SARS-CoV-2, NAA NOT DETECTED NOT DETECTED Final    Comment: (NOTE) This nucleic acid amplification test was developed and its performance characteristics determined by [employer] and [employer]. Nucleic acid amplification tests include RT-PCR and TMA. This test has not been FDA cleared or approved. This test has been authorized by FDA under an Emergency Use Authorization (EUA). This test is only authorized for the duration of time the declaration that circumstances exist justifying the authorization of the emergency use of in vitro diagnostic tests for detection of SARS-CoV-2 virus and/or diagnosis of COVID-19 infection under section 564(b)(1) of the Act, 21 U.S.C. 606YOK-5(T) (1), unless the authorization is terminated or revoked sooner. When diagnostic testing is negative, the possibility of a false negative result should be considered in the context of  a patient's recent exposures and the presence of clinical signs and symptoms consistent with COVID-19. An individual without symptoms of COVID- 19 and who is not shedding SARS-CoV-2  virus would expect to have a negative (not detected) result in this assay. Performed At: [facility] [address] [place], [place] [id number] Rush Farmer MD MM:[id number]    [place]  Final    Comment: Performed at [facility], [place] [address], [place], [place] [address]  Urine culture     Status: None   Collection Time: 03/23/19  3:26 PM   Specimen: In/Out Cath Urine  Result Value Ref Range Status   Specimen Description IN/OUT CATH URINE  Final   Special Requests NONE  Final   Culture   Final    NO GROWTH Performed at [facility], [place] [address] [place], [place] [address]    Report Status 03/24/2019 FINAL  Final  Blood Culture (routine x 2)     Status: None (Preliminary result)   Collection Time: 03/23/19  5:02 PM   Specimen: BLOOD  Result Value Ref Range Status   Specimen Description BLOOD RIGHT ANTECUBITAL  Final   Special Requests   Final    BOTTLES DRAWN AEROBIC ONLY Blood Culture results may not be optimal due to an inadequate volume of blood received in culture bottles   Culture   Final    NO GROWTH 4 DAYS Performed at [facility], [place] [address], [place], [place] [address]    Report Status PENDING  Incomplete  Respiratory Panel by RT PCR (Flu A&B, Covid) - Nasopharyngeal Swab     Status: None   Collection Time: 03/23/19  5:43 PM   Specimen: Nasopharyngeal Swab  Result Value Ref Range Status   SARS Coronavirus 2 by RT PCR NEGATIVE NEGATIVE Final    Comment: (NOTE) SARS-CoV-2 target nucleic acids are NOT DETECTED. The SARS-CoV-2 RNA is generally detectable in upper respiratoy specimens during the acute phase of infection. The lowest concentration of SARS-CoV-2 viral copies this assay can detect is 131 copies/mL. A negative result does not preclude SARS-Cov-2 infection and should not be used as the sole basis for treatment or other patient management decisions. A negative result may occur with  improper specimen collection/handling, submission of specimen other than nasopharyngeal swab, presence of viral mutation(s) within the areas targeted by this assay, and inadequate number of viral copies (<131 copies/mL). A negative result must be combined with  clinical observations, patient history, and epidemiological information. The expected result is Negative. Fact Sheet for Patients:  PinkCheek.be Fact Sheet for Healthcare Providers:  GravelBags.it This test is not yet ap proved or cleared by the [place] FDA and  has been authorized for detection and/or diagnosis of SARS-CoV-2 by FDA under an Emergency Use Authorization (EUA). This EUA will remain  in effect (meaning this test can be used) for the duration of the COVID-19 declaration under Section 564(b)(1) of the Act, 21 U.S.C. section 360bbb-3(b)(1), unless the authorization is terminated or revoked sooner.    Influenza A by PCR NEGATIVE NEGATIVE Final   Influenza B by PCR NEGATIVE NEGATIVE Final    Comment: (NOTE) The Xpert Xpress SARS-CoV-2/FLU/RSV assay is intended as an aid in  the diagnosis of influenza from Nasopharyngeal swab specimens and  should not be used as a sole basis for treatment. Nasal washings and  aspirates are unacceptable for Xpert Xpress SARS-CoV-2/FLU/RSV  testing. Fact Sheet for Patients: PinkCheek.be Fact Sheet for Healthcare  Providers: GravelBags.it This test is not yet approved or cleared by the [place] and  has been authorized for detection and/or diagnosis of SARS-CoV-2 by  FDA under an Emergency Use Authorization (EUA). This EUA will remain  in effect (meaning this test can be used) for the duration of the  Covid-19 declaration under Section 564(b)(1) of the Act, 21  U.S.C. section 360bbb-3(b)(1), unless the authorization is  terminated or revoked. Performed at [facility], [place] [address], [place], [place] [address]   CSF culture with Stat gram stain     Status: None   Collection Time: 03/23/19  6:06 PM   Specimen: CSF; Cerebrospinal Fluid  Result Value Ref Range Status   Specimen Description CSF  Final    Special Requests NONE  Final   Gram Stain   Final    WBC PRESENT, PREDOMINANTLY PMN NO ORGANISMS SEEN CYTOSPIN SMEAR    Culture   Final    NO GROWTH Performed at [facility], [place] [address] [place], [place] [address]    Report Status 03/27/2019 FINAL  Final  Culture, blood (single) w Reflex to ID Panel     Status: None (Preliminary result)   Collection Time: 03/24/19  8:37 PM   Specimen: BLOOD RIGHT HAND  Result Value Ref Range Status   Specimen Description BLOOD RIGHT HAND  Final   Special Requests   Final    BOTTLES DRAWN AEROBIC AND ANAEROBIC Blood Culture adequate volume   Culture   Final    NO GROWTH 3 DAYS Performed at [facility], [place] [address] [place], [place] [address]    Report Status PENDING  Incomplete     Time coordinating discharge: 35 minutes  SIGNED:   Jonnie Finner, DO  [facility] 03/28/2019, 10:43 AM   If 7PM-7AM, please contact night-coverage [email or web address]

## 2019-03-29 LAB — GI PATHOGEN PANEL BY PCR, STOOL

## 2019-03-29 LAB — CULTURE, BLOOD (SINGLE)
Culture: NO GROWTH
Special Requests: ADEQUATE

## 2019-04-06 ENCOUNTER — Encounter: Payer: Self-pay | Admitting: Neurology

## 2019-04-06 ENCOUNTER — Ambulatory Visit: Payer: 59 | Admitting: Neurology

## 2019-04-06 ENCOUNTER — Other Ambulatory Visit: Payer: Self-pay

## 2019-04-06 VITALS — BP 118/74 | HR 82 | Temp 96.9°F | Ht 67.0 in | Wt 157.0 lb

## 2019-04-06 DIAGNOSIS — R519 Headache, unspecified: Secondary | ICD-10-CM

## 2019-04-06 DIAGNOSIS — Z8661 Personal history of infections of the central nervous system: Secondary | ICD-10-CM | POA: Diagnosis not present

## 2019-04-06 DIAGNOSIS — G44209 Tension-type headache, unspecified, not intractable: Secondary | ICD-10-CM | POA: Diagnosis not present

## 2019-04-06 MED ORDER — AMITRIPTYLINE HCL 25 MG PO TABS: ORAL_TABLET | ORAL | 5 refills | Status: DC

## 2019-04-06 NOTE — Progress Notes (Signed)
Subjective:    Patient ID: Clara Herbison is a 39 y.o. female.  HPI     Star Age, MD, PhD [facility] [address] P.O. [place], [place] [address]  Dear Dr. Stephanie Acre,   I saw your patient, Siennah Barrasso, upon your kind request in my neurologic clinic today for initial consultation of her headaches.  The patient is unaccompanied today.  As you know, Ms. Eischeid is a 39 year old right-handed woman with an underlying medical history of thyroid disease, history of cellulitis, depression, asthma, anemia, and overweight state, who reports ongoing issues with headaches.  She feels that she has improved overall since her hospitalization recently last month but still has recurrent headaches which are primarily in the back of her head and radiate forward, she also has frontal headaches, they feel constant and at times throbbing.  She has some light sensitivity, some nausea at times, typically no vomiting.  She denies any prior history of migraines.  She has not been on anything for her headaches.  She has tried some Toradol and has had some leftover Compazine.  I reviewed your office records. She was recently discharged from the hospital.  She presented to the ER on 03/23/2019 with Has, myalgias, high fever of up to 104, and was found to have elevated white cell count. She was admitted for sepsis work-up.  She had an abnormal CSF but no organism was found, no growth on CSF culture, no organism on Gram stain.  She had been started on IV antibiotics but these were discontinued.  I reviewed the hospital records.  She was discharged on 03/28/2019.  COVID-19 testing was negative on 03/23/2019.  She had a head CT without contrast on 03/23/2019 reviewed the results: IMPRESSION: No acute intracranial pathology. She had a CT angiogram head and neck with and without contrast on 03/23/2019 and I reviewed the results: IMPRESSION: Negative CTA head and neck. No  significant stenosis or large vessel occlusion. Negative for aneurysm or dissection.  She denies any significant snoring, she denies waking up with a sense of gasping for air or witnessed apneas.  She does not have any recurrent nocturia. She tries to hydrate well with water.  She drinks alcohol occasionally in the form of wine.  She quit smoking 3 years ago.  She drinks caffeine in the form of coffee or soda, 1-2 servings per day on average.  She lives with her husband and 2 young children, ages 44 and 32 months old.  She is currently not breast-feeding.  Her Past Medical History Is Significant For: Past Medical History:  Diagnosis Date  . Adjustment disorder with mixed anxiety and depressed mood 12/17/2016  . Anemia   . Anemia affecting pregnancy, antepartum 12/17/2016  . Asthma   . Chorioamnionitis, delivered, current hospitalization 12/17/2016  . Depression   . Hx of cellulitis of skin with lymphangitis    from last CS  . Hypothyroidism 12/17/2016  . Thyroid dysfunction in pregnancy in third trimester     Her Past Surgical History Is Significant For: Past Surgical History:  Procedure Laterality Date  . CESAREAN SECTION N/A 12/16/2016   Procedure: CESAREAN SECTION;  Surgeon: Azucena Fallen, MD;  Location: [facility];  Service: Obstetrics;  Laterality: N/A;  . CESAREAN SECTION WITH BILATERAL TUBAL LIGATION Bilateral 05/25/2018   Procedure: Repeat CESAREAN SECTION WITH BILATERAL TUBAL LIGATION;  Surgeon: Azucena Fallen, MD;  Location: [facility];  Service: Obstetrics;  Laterality: Bilateral;  EDD: 05/22/18  .  GANGLION CYST EXCISION Left 4/15   3 rd toe  . HERNIA REPAIR Right 1991  . IR ANGIOGRAM PELVIS SELECTIVE OR SUPRASELECTIVE  05/28/2018  . IR ANGIOGRAM SELECTIVE EACH ADDITIONAL VESSEL  05/28/2018  . IR ANGIOGRAM SELECTIVE EACH ADDITIONAL VESSEL  05/28/2018  . IR ANGIOGRAM SELECTIVE EACH ADDITIONAL VESSEL  05/28/2018  . IR EMBO ART  VEN HEMORR LYMPH EXTRAV  INC GUIDE  ROADMAPPING  05/28/2018  . IR US GUIDE VASC ACCESS RIGHT  05/28/2018  . TONSILLECTOMY  1997    Her Family History Is Significant For: Family History  Adopted: Yes  Problem Relation Age of Onset  . Uterine cancer Maternal Grandmother     Her Social History Is Significant For: Social History   Socioeconomic History  . Marital status: Married    Spouse name: Not on file  . Number of children: 0  . Years of education: Not on file  . Highest education level: Not on file  Occupational History  . Not on file  Tobacco Use  . Smoking status: Former Smoker    Packs/day: 0.50    Years: 7.00    Pack years: 3.50    Types: Cigarettes    Quit date: 03/04/2013    Years since quitting: 6.0  . Smokeless tobacco: Never Used  . Tobacco comment: does some vape  Substance and Sexual Activity  . Alcohol use: Not Currently    Alcohol/week: 0.0 standard drinks    Comment: 9 a week  . Drug use: No  . Sexual activity: Not Currently    Partners: Male    Birth control/protection: None  Other Topics Concern  . Not on file  Social History Narrative  . Not on file   Social Determinants of Health   Financial Resource Strain: Low Risk   . Difficulty of Paying Living Expenses: Not hard at all  Food Insecurity: No Food Insecurity  . Worried About Programme researcher, broadcasting/film/video in the Last Year: Never true  . Ran Out of Food in the Last Year: Never true  Transportation Needs: Unknown  . Lack of Transportation (Medical): No  . Lack of Transportation (Non-Medical): Not on file  Physical Activity:   . Days of Exercise per Week: Not on file  . Minutes of Exercise per Session: Not on file  Stress: No Stress Concern Present  . Feeling of Stress : Only a little  Social Connections:   . Frequency of Communication with Friends and Family: Not on file  . Frequency of Social Gatherings with Friends and Family: Not on file  . Attends Religious Services: Not on file  . Active Member of Clubs or Organizations: Not on  file  . Attends Banker Meetings: Not on file  . Marital Status: Not on file    Her Allergies Are:  No Known Allergies:   Her Current Medications Are:  Outpatient Encounter Medications as of 04/06/2019  Medication Sig  . acetaminophen (TYLENOL) 500 MG tablet Take 1,000 mg by mouth every 6 (six) hours as needed for mild pain, moderate pain or headache.   . albuterol (PROVENTIL HFA;VENTOLIN HFA) 108 (90 BASE) MCG/ACT inhaler Inhale 2 puffs into the lungs every 6 (six) hours as needed for wheezing.  . busPIRone (BUSPAR) 5 MG tablet Take 5 mg by mouth 2 (two) times daily.  . cetirizine (ZYRTEC) 10 MG tablet Take 10 mg by mouth at bedtime.   [name] Kitchen levothyroxine (SYNTHROID, LEVOTHROID) 50 MCG tablet Take 50 mcg by mouth every other day.   [name] Kitchen  levothyroxine (SYNTHROID, LEVOTHROID) 75 MCG tablet Take 75 mcg by mouth every other day.   . prochlorperazine (COMPAZINE) 10 MG tablet Take 1 tablet (10 mg total) by mouth every 8 (eight) hours.  . sertraline (ZOLOFT) 100 MG tablet Take 100 mg by mouth at bedtime.  . [DISCONTINUED] ipratropium (ATROVENT) 0.06 % nasal spray Place 2 sprays into both nostrils 4 (four) times daily. (Patient not taking: Reported on 03/23/2019)   No facility-administered encounter medications on file as of 04/06/2019.  :   Review of Systems:  Out of a complete 14 point review of systems, all are reviewed and negative with the exception of these symptoms as listed below: Review of Systems  Neurological:       Here to discuss worsening headaches. Pt reports 2 weeks ago she was dx with meningitis and since h/a have worsened. Reports h/a daily and otc treatments are not helping.  Epworth Sleepiness Scale 0= would never doze 1= slight chance of dozing 2= moderate chance of dozing 3= high chance of dozing  Sitting and reading:1 Watching TV:2 Sitting inactive in a public place (ex. Theater or meeting):0 As a passenger in a car for an hour without a break:3 Lying down to  rest in the afternoon:3 Sitting and talking to someone:0 Sitting quietly after lunch (no alcohol):1 In a car, while stopped in traffic:0 Total:10     Objective:  Neurological Exam  Physical Exam Physical Examination:   Vitals:   04/06/19 0929  BP: 118/74  Pulse: 82  Temp: (!) 96.9 F (36.1 C)    General Examination: The patient is a very pleasant 39 y.o. female in no acute distress. She appears well-developed and well-nourished and well groomed.   HEENT: Normocephalic, atraumatic, pupils are equal, round and reactive to light and accommodation. Funduscopic exam is normal with sharp disc margins noted. Extraocular tracking is good without limitation to gaze excursion or nystagmus noted. Normal smooth pursuit is noted. Hearing is grossly intact. Face is symmetric with normal facial animation and normal facial sensation. Speech is clear with no dysarthria noted. There is no hypophonia. There is no lip, neck/head, jaw or voice tremor. Neck is supple with full range of passive and active motion. There are no carotid bruits on auscultation. Oropharynx exam reveals: mild mouth dryness, adequate dental hygiene and mild airway crowding, due to smaller airway. Tongue protrudes centrally and palate elevates symmetrically.   Chest: Clear to auscultation without wheezing, rhonchi or crackles noted.  Heart: S1+S2+0, regular and normal without murmurs, rubs or gallops noted.   Abdomen: Soft, non-tender and non-distended with normal bowel sounds appreciated on auscultation.  Extremities: There is no pitting edema in the distal lower extremities bilaterally. Pedal pulses are intact.  Skin: Warm and dry without trophic changes noted.  Musculoskeletal: exam reveals no obvious joint deformities, tenderness or joint swelling or erythema.   Neurologically:  Mental status: The patient is awake, alert and oriented in all 4 spheres. Her immediate and remote memory, attention, language skills and fund  of knowledge are appropriate. There is no evidence of aphasia, agnosia, apraxia or anomia. Speech is clear with normal prosody and enunciation. Thought process is linear. Mood is normal and affect is normal.  Cranial nerves II - XII are as described above under HEENT exam. In addition: shoulder shrug is normal with equal shoulder height noted. Motor exam: Normal bulk, strength and tone is noted. There is no drift, tremor or rebound. Romberg is negative. Reflexes are 2+ throughout. Babinski: Toes are flexor  bilaterally. Fine motor skills and coordination: intact with normal finger taps, normal hand movements, normal rapid alternating patting, normal foot taps and normal foot agility.  Cerebellar testing: No dysmetria or intention tremor on finger to nose testing. Heel to shin is unremarkable bilaterally. There is no truncal or gait ataxia.  Sensory exam: intact to light touch, vibration, temperature sense in the upper and lower extremities.  Gait, station and balance: She stands easily. No veering to one side is noted. No leaning to one side is noted. Posture is age-appropriate and stance is narrow based. Gait shows normal stride length and normal pace. No problems turning are noted. Tandem walk is unremarkable.   Assessment and Plan:   In summary, Taimi Joshalyn Ancheta is a very pleasant 39 y.o.-year old female with an underlying medical history of thyroid disease, history of cellulitis, depression, asthma, anemia, and overweight state, who  Presents for evaluation of her recurrent headaches.  Her history is not compelling for migraine headaches.  She started having headaches when she got acutely Sick last month, she was suspected to have meningitis.  She was treated with antibiotics but culture was negative.  She has improved since her hospitalization thankfully.  Neurological exam is nonfocal.  She is advised to proceed with further testing.  I would like to proceed with a brain MRI with and without  contrast.  For recurrent headaches I suggested a trial of amitriptyline.  We will start low dose and increase it over the next few weeks to up to 50 mg at night.  We talked about expectations, potential side effects and limitation of the medication.  She was given written instruction and a new prescription.  She is advised to stay well-hydrated and well rested.  She is advised to seek a formal eye examination as it has been about 2 years since her last eye exam and she does have prescription eyeglasses.We will keep her posted as to her brain MRI results by phone call. She is advised to follow-up with one of our nurse practitioners in 3 months for recheck. I answered all her questions today and she was in agreement. Thank you very much for allowing me to participate in the care of this nice patient. If I can be of any further assistance to you please do not hesitate to call me at [phone].  Sincerely,   Huston Foley, MD, PhD

## 2019-04-06 NOTE — Patient Instructions (Addendum)
I am glad to hear that your symptoms have been improving, nevertheless, for your recurrent headaches I would recommend preventative medication that we use typically for migraine patients.While it does not sound like you have a history of migraine headaches, I think it would help to use a daily preventative at this point.  This may be a temporary medication and we can re-evaluate over time.  Elavil (generic name: amitriptyline) 25 mg: Take half a pill daily at bedtime for one week, then one pill daily at bedtime for one week, then one and a half pills daily at bedtime for one week, then 2 pills daily at bedtime thereafter. Common side effects reported are: mouth dryness, drowsiness, confusion, dizziness.  We will do a brain scan, called MRI and call you with the test results. We will have to schedule you for this on a separate date. This test requires authorization from your insurance, and we will take care of the insurance process. Please also schedule a formal eye examination as you have prescription eyeglasses and have not had an eye exam in the last couple of years. Please follow-up to see one of our nurse practitioners in about 3 months.

## 2019-04-12 ENCOUNTER — Telehealth: Payer: Self-pay | Admitting: Neurology

## 2019-04-12 NOTE — Telephone Encounter (Signed)
I reached out to the pt. She states since starting the amitriptyline on 04/06/2019 she has been feeling groggy and sleepy throughout the day. Pt sts she has been taking 1/2 tablet for 1 week as directed and is scheduled to increase to 1 whole tab daily for 7 days tomorrow. Pt expressed worry on doing this and wanted to know Dr. Teofilo Pod thoughts? She also sts she is scheduled to return to work this week and wanted to know if Dr. Frances Furbish could write a letter keeping her out for another week to see if the grogginess/sleepiness will improve.

## 2019-04-12 NOTE — Telephone Encounter (Signed)
Please advise patient that she can stop the amitriptyline, due to Side effects reported.  Unfortunately, I cannot justify writing her out of work based on sleepiness from medication, she can go ahead and stop it as she is not on a high dose yet. Once she feels that the sleepiness is improved, we can consider another medication, I will likely recommend gabapentin.  We should give it a few days so the amitriptyline is out of her system, please encourage her to call back once her sleepiness is improved in the next 2 to 3 days and we can consider starting her on a medication called gabapentin.

## 2019-04-12 NOTE — Telephone Encounter (Signed)
Pt has called to report that her dose of amitriptyline (ELAVIL) 25 MG tablet makes her sluggish, tired and groggy.  Pt is asking for a call to discuss other options.

## 2019-04-12 NOTE — Telephone Encounter (Signed)
I reached out to the pt and advised of Dr. Teofilo Pod recommendation. She verbalized understanding and sts she will call back in 2-3 days to report how she if feeling and instructions on starting the gabapentin.

## 2019-04-15 MED ORDER — GABAPENTIN 100 MG PO CAPS: 100.0000 mg | ORAL_CAPSULE | Freq: Every evening | ORAL | 3 refills | Status: DC | PRN

## 2019-04-15 NOTE — Telephone Encounter (Signed)
Gabapentin Rx sent to pharmacy. Please remind patient that gabapentin is not considered safe during pregnancy, it can also cause sleepiness, balance problem, she can start by using it as needed in the evening as it can be sedating.

## 2019-04-15 NOTE — Telephone Encounter (Signed)
Pt has called to report that she feels better and she would like for the medication in place of amitriptyline (ELAVIL) 25 MG tablet to be called in to the [facility] on [address] ph [phone]

## 2019-04-15 NOTE — Telephone Encounter (Signed)
I reached out to the pt and she was agreeable to trying the gabapentin. Pt requested the rx [facility] to [facility] [address]. I have transferred. [facility] notified to cancel rx.

## 2019-04-15 NOTE — Addendum Note (Signed)
Addended by: Ann Maki T on: 04/15/2019 12:25 PM   Modules accepted: Orders

## 2019-04-15 NOTE — Addendum Note (Signed)
Addended by: Huston Foley on: 04/15/2019 11:13 AM   Modules accepted: Orders

## 2019-04-19 ENCOUNTER — Telehealth: Payer: Self-pay

## 2019-04-19 NOTE — Telephone Encounter (Signed)
[employer] is requiring a peer to peer.  Your Notification number request has been assigned Case Number [id number].  A Physician-to-Physician discussion is required to complete the Notification Process. The ordering physician must call [phone] and select option #3 to engage in a Physician-to-Physician discussion. Please ensure the physician has the case number provided above available when making this call. If a Physician-to-Physician discussion is not conducted within 3 business days, this notification number request will be deemed expired and you must reinitiate the Notification Process

## 2019-04-20 ENCOUNTER — Telehealth: Payer: Self-pay

## 2019-04-20 DIAGNOSIS — Z1810 Retained metal fragments, unspecified: Secondary | ICD-10-CM

## 2019-04-20 NOTE — Telephone Encounter (Signed)
Please advise patient that her insurance has not approved her brain MRI.  For now, we will follow her clinically and will revisit the need for imaging at the next visit or down the road.  Please advise her to keep her follow-up appointment with Amy Lomax as scheduled.

## 2019-04-20 NOTE — Telephone Encounter (Signed)
I schedule the patient for her MRI, she said she has previous injury due to metal around her eyes but she said it was all removed but wanted to confirm it was still safe for the MRI. She also wants to speak with the nurse regarding some new symptoms she is having such as having a hard time getting her words out. Please call and advise.

## 2019-04-20 NOTE — Telephone Encounter (Signed)
Megan Lara, Just wanted to confirm.  I received this message from [place] stating.   [employer] is requiring a peer to peer.  Your Notification number request has been assigned Case Number [id number]  A Physician-to-Physician discussion is required to complete the Notification Process. The ordering physician must call [phone] and select option #3 to engage in a Physician-to-Physician discussion. Please ensure the physician has the case number provided above available when making this call. If a Physician-to-Physician discussion is not conducted within 3 business days, this notification number request will be deemed expired and you must reinitiate the Notification Process.  Dr. Frances Furbish did not complete the peer to peer. Just wanted to verify if the test had been approved?

## 2019-04-21 ENCOUNTER — Other Ambulatory Visit: Payer: 59

## 2019-04-21 ENCOUNTER — Ambulatory Visit
Admission: RE | Admit: 2019-04-21 | Discharge: 2019-04-21 | Disposition: A | Discharge status: Home / Self Care | Payer: 59 | Source: Ambulatory Visit | Attending: Neurology | Admitting: Neurology

## 2019-04-21 DIAGNOSIS — Z1810 Retained metal fragments, unspecified: Secondary | ICD-10-CM

## 2019-04-21 NOTE — Telephone Encounter (Addendum)
I reached out to the pt. She reports she wanted the MD to know she has noticed some trouble getting her words out since starting the gabapentin. She is unsure if the slowing of her words is due to the medication or her meningitis but wanted MD to be advised.   She also reports an previous injury where metal around her eyes had to be removed. She sts she was advised all particles were removed but wanted to know if MD had any reservations on her having the MRI at 115 today?

## 2019-04-21 NOTE — Telephone Encounter (Signed)
Noted I called the pt and left a vm asking for a call back so we could discuss this message further.

## 2019-04-21 NOTE — Telephone Encounter (Signed)
I reached to the Megan Lara and discussed this. Megan Lara was agreeable postponing the MRI until x-ray could be completed to verify if she has any metal around her eyes.  I transferred call to Duwayne Heck who will reschedule MRI.   Dr. Frances Furbish, could you order the x-ray?  Thanks!

## 2019-04-21 NOTE — Telephone Encounter (Signed)
X-ray orbits ordered

## 2019-04-21 NOTE — Telephone Encounter (Signed)
MRI Brain w/wo Dr. Filiberto Pinks [id number] (05/30/19). Patient is scheduled on the mobile unit for 04/21/19.

## 2019-04-21 NOTE — Telephone Encounter (Signed)
We have a [facility] approval [employer] [id number] (05/30/19). She is scheduled today on the mobile unit.

## 2019-04-21 NOTE — Telephone Encounter (Signed)
Please call pt: I really do not know if she has any residual metal around the eyes And as such is safe to take the MRI, please advise patient to get in touch with her radiology department immediately to tell them that she may have residual metal around the eyes.  Low-dose gabapentin typically does not cause any word finding difficulties. If she has any acute onset of neurological symptoms that are new, she may have to go to the emergency room to get checked out urgently.

## 2019-04-21 NOTE — Telephone Encounter (Signed)
I spoke to the patient. She is going today once she gets off work to get the Xray done. She is keeping her appt for 04/27/19 at [facility].

## 2019-04-21 NOTE — Addendum Note (Signed)
Addended by: Huston Foley on: 04/21/2019 12:31 PM   Modules accepted: Orders

## 2019-04-26 ENCOUNTER — Telehealth: Payer: Self-pay

## 2019-04-26 NOTE — Telephone Encounter (Signed)
I reached out to the pt and advised of results.  Pt verbalized understanding and is scheduled for MRI on 04/27/2019.

## 2019-04-26 NOTE — Telephone Encounter (Signed)
-----   Message from Huston Foley, MD sent at 04/26/2019  7:31 AM EST ----- Xray orbits shows no evidence of metallic foreign body. Please advise patient to reschedule her MRI appt.

## 2019-04-26 NOTE — Progress Notes (Signed)
Xray orbits shows no evidence of metallic foreign body. Please advise patient to reschedule her MRI appt.

## 2019-04-27 ENCOUNTER — Ambulatory Visit (INDEPENDENT_AMBULATORY_CARE_PROVIDER_SITE_OTHER): Payer: 59

## 2019-04-27 ENCOUNTER — Other Ambulatory Visit: Payer: Self-pay

## 2019-04-27 DIAGNOSIS — R519 Headache, unspecified: Secondary | ICD-10-CM

## 2019-04-27 DIAGNOSIS — Z8661 Personal history of infections of the central nervous system: Secondary | ICD-10-CM

## 2019-04-27 MED ORDER — GADOBENATE DIMEGLUMINE 529 MG/ML IV SOLN
15.0000 mL | Freq: Once | INTRAVENOUS | Status: AC | PRN
  Administered 2019-04-27: 15 mL via INTRAVENOUS

## 2019-04-29 ENCOUNTER — Telehealth: Payer: Self-pay

## 2019-04-29 NOTE — Telephone Encounter (Signed)
-----   Message from Huston Foley, MD sent at 04/29/2019  3:51 PM EST ----- Brain MRI with and without contrast was reported as normal, please update patient.

## 2019-04-29 NOTE — Progress Notes (Signed)
Brain MRI with and without contrast was reported as normal, please update patient.

## 2019-04-29 NOTE — Telephone Encounter (Signed)
Noted, thank you

## 2019-04-29 NOTE — Telephone Encounter (Signed)
I reached out to the pt and advised of MRI results. She verbalized understanding. Pt reported last weekend Sat-Monday she struggled with a bad migraine, but is feeling better.   Pt will continue gabapentin and monitor sx but wanted MD to be aware.

## 2019-06-14 ENCOUNTER — Ambulatory Visit
Admission: EM | Admit: 2019-06-14 | Discharge: 2019-06-14 | Disposition: A | Discharge status: Home / Self Care | Payer: 59 | Attending: Physician Assistant | Admitting: Physician Assistant

## 2019-06-14 DIAGNOSIS — R109 Unspecified abdominal pain: Secondary | ICD-10-CM

## 2019-06-14 DIAGNOSIS — N3090 Cystitis, unspecified without hematuria: Secondary | ICD-10-CM | POA: Diagnosis present

## 2019-06-14 LAB — POCT URINALYSIS DIP (MANUAL ENTRY)
Bilirubin, UA: NEGATIVE
Glucose, UA: NEGATIVE mg/dL
Ketones, POC UA: NEGATIVE mg/dL
Nitrite, UA: POSITIVE — AB
Protein Ur, POC: NEGATIVE mg/dL
Spec Grav, UA: 1.01 (ref 1.010–1.025)
Urobilinogen, UA: 0.2 E.U./dL
pH, UA: 5.5 (ref 5.0–8.0)

## 2019-06-14 MED ORDER — ONDANSETRON 4 MG PO TBDP: 4.0000 mg | ORAL_TABLET | Freq: Three times a day (TID) | ORAL | 0 refills | Status: DC | PRN

## 2019-06-14 MED ORDER — CEPHALEXIN 500 MG PO CAPS: 500.0000 mg | ORAL_CAPSULE | Freq: Three times a day (TID) | ORAL | 0 refills | Status: AC

## 2019-06-14 MED ORDER — KETOROLAC TROMETHAMINE 15 MG/ML IJ SOLN
15.0000 mg | Freq: Once | INTRAMUSCULAR | Status: AC
  Administered 2019-06-14: 15:00:00 15 mg via INTRAMUSCULAR

## 2019-06-14 MED ORDER — FLUCONAZOLE 150 MG PO TABS: 150.0000 mg | ORAL_TABLET | Freq: Every day | ORAL | 0 refills | Status: DC

## 2019-06-14 MED ORDER — KETOROLAC TROMETHAMINE 10 MG PO TABS: 10.0000 mg | ORAL_TABLET | Freq: Four times a day (QID) | ORAL | 0 refills | Status: DC | PRN

## 2019-06-14 NOTE — Discharge Instructions (Addendum)
As discussed, given AZO use, may have a false positive result, urine culture sent.  Start Keflex as directed for urinary tract infection.  Diflucan to prevent yeast infection.  Toradol to help with pain.  As discussed, right flank pain could be due to kidney infection, or may be a passing stone.  If you start having fevers, right flank pain does not resolve with a 7-day course of antibiotic, please give [place] a call, and we will assess if we need to extend out the antibiotic course.  If significant worsening of symptoms, nausea/vomiting not controlled by medication, unable to urinate, go to the emergency department for further evaluation.

## 2019-06-14 NOTE — ED Provider Notes (Signed)
[facility]    CSN: [id number] Arrival date & time: 06/14/19  1259      History   Chief Complaint Chief Complaint  Patient presents with  . Urinary Tract Infection    HPI Megan Lara is a 39 y.o. female.   39 year old female comes in for 4 day history of urinary symptoms. Has had urinary frequency, urgency, dysuria.  3 days ago, started having right flank pain.  States when pain is worse, can have nausea.  No vomiting.  Denies fever, chills.  Denies abdominal pain. Denies vaginal discharge, itching, spotting. LMP 06/04/2019. CT abdomen in 2020 showed 64mm nephrolithiasis to the right.      Past Medical History:  Diagnosis Date  . Adjustment disorder with mixed anxiety and depressed mood 12/17/2016  . Anemia   . Anemia affecting pregnancy, antepartum 12/17/2016  . Asthma   . Chorioamnionitis, delivered, current hospitalization 12/17/2016  . Depression   . Hx of cellulitis of skin with lymphangitis    from last CS  . Hypothyroidism 12/17/2016  . Thyroid dysfunction in pregnancy in third trimester     Patient Active Problem List   Diagnosis Date Noted  . 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 Hematoma of rectus sheath 05/28/2018  . Status post repeat low transverse cesarean section and BTL 05/25/2018  . Postpartum care following cesarean delivery (3/23) 05/25/2018  . Carpal tunnel syndrome, left upper limb 01/13/2017  . Carpal tunnel syndrome, right upper limb 01/13/2017  . Hypothyroidism 12/17/2016  . Adjustment disorder with mixed anxiety and depressed mood 12/17/2016    Past Surgical History:  Procedure Laterality Date  . CESAREAN SECTION N/A 12/16/2016   Procedure: CESAREAN SECTION;  Surgeon: Azucena Fallen, MD;  Location: [facility];  Service: Obstetrics;  Laterality: N/A;  . CESAREAN SECTION WITH BILATERAL TUBAL LIGATION Bilateral 05/25/2018   Procedure: Repeat CESAREAN SECTION WITH BILATERAL TUBAL LIGATION;  Surgeon: Azucena Fallen, MD;   Location: [facility];  Service: Obstetrics;  Laterality: Bilateral;  EDD: 05/22/18  . GANGLION CYST EXCISION Left 4/15   3 rd toe  . HERNIA REPAIR Right 1991  . IR ANGIOGRAM PELVIS SELECTIVE OR SUPRASELECTIVE  05/28/2018  . IR ANGIOGRAM SELECTIVE EACH ADDITIONAL VESSEL  05/28/2018  . IR ANGIOGRAM SELECTIVE EACH ADDITIONAL VESSEL  05/28/2018  . IR ANGIOGRAM SELECTIVE EACH ADDITIONAL VESSEL  05/28/2018  . IR EMBO ART  VEN HEMORR LYMPH EXTRAV  INC GUIDE ROADMAPPING  05/28/2018  . IR US GUIDE VASC ACCESS RIGHT  05/28/2018  . TONSILLECTOMY  1997    OB History    Gravida  2   Para  2   Term  2   Preterm      AB      Living  1     SAB      TAB      Ectopic      Multiple  0   Live Births  1            Home Medications    Prior to Admission medications   Medication Sig Start Date End Date Taking? Authorizing Provider  acetaminophen (TYLENOL) 500 MG tablet Take 1,000 mg by mouth every 6 (six) hours as needed for mild pain, moderate pain or headache.     [provider]  albuterol (PROVENTIL HFA;VENTOLIN HFA) 108 (90 BASE) MCG/ACT inhaler Inhale 2 puffs into the lungs every 6 (six) hours as needed for wheezing.    [provider]  busPIRone (BUSPAR) 5  MG tablet Take 5 mg by mouth 2 (two) times daily.    Emergency, Nurse, RN  cephALEXin (KEFLEX) 500 MG capsule Take 1 capsule (500 mg total) by mouth 3 (three) times daily for 7 days. 06/14/19 06/21/19  Belinda Fisher, PA-C  cetirizine (ZYRTEC) 10 MG tablet Take 10 mg by mouth at bedtime.     [provider]  fluconazole (DIFLUCAN) 150 MG tablet Take 1 tablet (150 mg total) by mouth daily. Take second dose 72 hours later if symptoms still persists. 06/14/19   Cathie Hoops, Amy V, PA-C  gabapentin (NEURONTIN) 100 MG capsule Take 1 capsule (100 mg total) by mouth at bedtime as needed. 04/15/19   Huston Foley, MD  ketorolac (TORADOL) 10 MG tablet Take 1 tablet (10 mg total) by mouth every 6 (six) hours as needed. 06/14/19   Cathie Hoops, Amy  V, PA-C  levothyroxine (SYNTHROID, LEVOTHROID) 50 MCG tablet Take 50 mcg by mouth every other day.  10/05/15   [provider]  levothyroxine (SYNTHROID, LEVOTHROID) 75 MCG tablet Take 75 mcg by mouth every other day.     [provider]  ondansetron (ZOFRAN ODT) 4 MG disintegrating tablet Take 1 tablet (4 mg total) by mouth every 8 (eight) hours as needed for nausea or vomiting. 06/14/19   Cathie Hoops, Amy V, PA-C  sertraline (ZOLOFT) 100 MG tablet Take 100 mg by mouth at bedtime.    [provider]    Family History Family History  Adopted: Yes  Problem Relation Age of Onset  . Uterine cancer Maternal Grandmother     Social History Social History   Tobacco Use  . Smoking status: Former Smoker    Packs/day: 0.50    Years: 7.00    Pack years: 3.50    Types: Cigarettes    Quit date: 03/04/2013    Years since quitting: 6.2  . Smokeless tobacco: Never Used  . Tobacco comment: does some vape  Substance Use Topics  . Alcohol use: Not Currently    Alcohol/week: 0.0 standard drinks    Comment: 9 a week  . Drug use: No     Allergies   Patient has no known allergies.   Review of Systems Review of Systems  Reason unable to perform ROS: See HPI as above.     Physical Exam Triage Vital Signs ED Triage Vitals  Enc Vitals Group     BP 06/14/19 1400 113/75     Pulse Rate 06/14/19 1400 83     Resp 06/14/19 1400 16     Temp 06/14/19 1400 98.4 F (36.9 C)     Temp Source 06/14/19 1400 Oral     SpO2 06/14/19 1400 98 %     Weight --      Height --      Head Circumference --      Peak Flow --      Pain Score 06/14/19 1416 6     Pain Loc --      Pain Edu? --      Excl. in GC? --    No data found.  Updated Vital Signs BP 113/75 (BP Location: Right Arm)   Pulse 83   Temp 98.4 F (36.9 C) (Oral)   Resp 16   LMP 06/04/2019   SpO2 98%   Breastfeeding No   Physical Exam Constitutional:      General: She is not in acute distress.    Appearance: She is  well-developed. She is not ill-appearing, toxic-appearing or diaphoretic.  HENT:     Head: Normocephalic and atraumatic.  Eyes:     Conjunctiva/sclera: Conjunctivae normal.     Pupils: Pupils are equal, round, and reactive to light.  Cardiovascular:     Rate and Rhythm: Normal rate and regular rhythm.  Pulmonary:     Effort: Pulmonary effort is normal. No respiratory distress.     Comments: LCTAB Abdominal:     General: Bowel sounds are normal.     Palpations: Abdomen is soft.     Tenderness: There is no abdominal tenderness. There is right CVA tenderness. There is no left CVA tenderness, guarding or rebound.  Musculoskeletal:     Cervical back: Normal range of motion and neck supple.  Skin:    General: Skin is warm and dry.  Neurological:     Mental Status: She is alert and oriented to person, place, and time.  Psychiatric:        Behavior: Behavior normal.        Judgment: Judgment normal.     UC Treatments / Results  Labs (all labs ordered are listed, but only abnormal results are displayed) Labs Reviewed  POCT URINALYSIS DIP (MANUAL ENTRY) - Abnormal; Notable for the following components:      Result Value   Color, UA orange (*)    Blood, UA trace-intact (*)    Nitrite, UA Positive (*)    Leukocytes, UA Small (1+) (*)    All other components within normal limits  URINE CULTURE    EKG   Radiology No results found.  Procedures Procedures (including critical care time)  Medications Ordered in UC Medications  ketorolac (TORADOL) 15 MG/ML injection 15 mg (has no administration in time range)    Initial Impression / Assessment and Plan / UC Course  I have reviewed the triage vital signs and the nursing notes.  Pertinent labs & imaging results that were available during my care of the patient were reviewed by me and considered in my medical decision making (see chart for details).    Patient with AZO use, discussed may have false positive urine dipstick,  urine culture sent.  Will treat for urinary tract infection with Keflex. ?  Urethral stone versus pyelonephritis causing right flank pain.  At this time, will provide Toradol injection, and Toradol tablets for pain control.  If symptoms not improving after course of Keflex, or develop fever, to call us, and may extend course of antibiotic to cover for pyelonephritis.  Return precautions given.  Patient expresses understanding and agrees to plan.  Final Clinical Impressions(s) / UC Diagnoses   Final diagnoses:  Cystitis  Right flank pain    ED Prescriptions    Medication Sig Dispense Auth. Provider   cephALEXin (KEFLEX) 500 MG capsule Take 1 capsule (500 mg total) by mouth 3 (three) times daily for 7 days. 21 capsule Yu, Amy V, PA-C   fluconazole (DIFLUCAN) 150 MG tablet Take 1 tablet (150 mg total) by mouth daily. Take second dose 72 hours later if symptoms still persists. 2 tablet Yu, Amy V, PA-C   ketorolac (TORADOL) 10 MG tablet Take 1 tablet (10 mg total) by mouth every 6 (six) hours as needed. 20 tablet Yu, Amy V, PA-C   ondansetron (ZOFRAN ODT) 4 MG disintegrating tablet Take 1 tablet (4 mg total) by mouth every 8 (eight) hours as needed for nausea or vomiting. 20 tablet Belinda Fisher, PA-C     PDMP not reviewed this encounter.   Belinda Fisher, PA-C  06/14/19 1450  

## 2019-06-14 NOTE — ED Triage Notes (Signed)
Pt c/o urinary burning, urgency and frequency since Friday and now having rt flank pain.

## 2019-06-16 LAB — URINE CULTURE: Culture: 70000 — AB

## 2019-06-17 ENCOUNTER — Other Ambulatory Visit: Payer: Self-pay

## 2019-06-17 ENCOUNTER — Encounter (HOSPITAL_COMMUNITY): Payer: Self-pay

## 2019-06-17 ENCOUNTER — Emergency Department (HOSPITAL_COMMUNITY)
Admission: EM | Admit: 2019-06-17 | Discharge: 2019-06-18 | Disposition: A | Discharge status: Home / Self Care | Payer: 59 | Attending: Emergency Medicine | Admitting: Emergency Medicine

## 2019-06-17 DIAGNOSIS — E039 Hypothyroidism, unspecified: Secondary | ICD-10-CM | POA: Diagnosis not present

## 2019-06-17 DIAGNOSIS — R109 Unspecified abdominal pain: Secondary | ICD-10-CM | POA: Diagnosis present

## 2019-06-17 DIAGNOSIS — N200 Calculus of kidney: Secondary | ICD-10-CM | POA: Diagnosis not present

## 2019-06-17 DIAGNOSIS — Z87891 Personal history of nicotine dependence: Secondary | ICD-10-CM | POA: Insufficient documentation

## 2019-06-17 LAB — BASIC METABOLIC PANEL
Anion gap: 10 (ref 5–15)
BUN: 6 mg/dL (ref 6–20)
CO2: 23 mmol/L (ref 22–32)
Calcium: 9.5 mg/dL (ref 8.9–10.3)
Chloride: 107 mmol/L (ref 98–111)
Creatinine, Ser: 0.69 mg/dL (ref 0.44–1.00)
GFR calc Af Amer: >60 mL/min (ref >60)
GFR calc non Af Amer: >60 mL/min (ref >60)
Glucose, Bld: 102 mg/dL — ABNORMAL HIGH (ref 70–99)
Potassium: 3.8 mmol/L (ref 3.5–5.1)
Sodium: 140 mmol/L (ref 135–145)

## 2019-06-17 LAB — URINALYSIS, ROUTINE W REFLEX MICROSCOPIC
Bilirubin Urine: NEGATIVE
Glucose, UA: NEGATIVE mg/dL
Hgb urine dipstick: NEGATIVE
Ketones, ur: NEGATIVE mg/dL
Nitrite: NEGATIVE
Protein, ur: NEGATIVE mg/dL
Specific Gravity, Urine: 1.011 (ref 1.005–1.030)
pH: 7 (ref 5.0–8.0)

## 2019-06-17 LAB — CBC
HCT: 38.2 % (ref 36.0–46.0)
Hemoglobin: 12.2 g/dL (ref 12.0–15.0)
MCH: 30.9 pg (ref 26.0–34.0)
MCHC: 31.9 g/dL (ref 30.0–36.0)
MCV: 96.7 fL (ref 80.0–100.0)
Platelets: 394 10*3/uL (ref 150–400)
RBC: 3.95 MIL/uL (ref 3.87–5.11)
RDW: 12.5 % (ref 11.5–15.5)
WBC: 8.1 10*3/uL (ref 4.0–10.5)
nRBC: 0 % (ref 0.0–0.2)

## 2019-06-17 LAB — I-STAT BETA HCG BLOOD, ED (MC, WL, AP ONLY): I-stat hCG, quantitative: <5 m[IU]/mL (ref <5)

## 2019-06-17 NOTE — ED Triage Notes (Signed)
Pt arrives to ED w/ R sided flank pain 6/10. Pt denies hematuria.

## 2019-06-18 ENCOUNTER — Emergency Department (HOSPITAL_COMMUNITY): Payer: 59

## 2019-06-18 MED ORDER — ONDANSETRON HCL 4 MG/2ML IJ SOLN
4.0000 mg | Freq: Once | INTRAMUSCULAR | Status: AC
  Administered 2019-06-18: 4 mg via INTRAVENOUS
  Filled 2019-06-18: qty 2

## 2019-06-18 MED ORDER — ONDANSETRON 4 MG PO TBDP: 4.0000 mg | ORAL_TABLET | Freq: Four times a day (QID) | ORAL | 0 refills | Status: DC | PRN

## 2019-06-18 MED ORDER — KETOROLAC TROMETHAMINE 30 MG/ML IJ SOLN
30.0000 mg | Freq: Once | INTRAMUSCULAR | Status: AC
  Administered 2019-06-18: 02:00:00 30 mg via INTRAVENOUS
  Filled 2019-06-18: qty 1

## 2019-06-18 MED ORDER — SODIUM CHLORIDE 0.9 % IV BOLUS (SEPSIS)
1000.0000 mL | Freq: Once | INTRAVENOUS | Status: AC
  Administered 2019-06-18: 1000 mL via INTRAVENOUS

## 2019-06-18 MED ORDER — OXYCODONE-ACETAMINOPHEN 5-325 MG PO TABS: 2.0000 | ORAL_TABLET | Freq: Four times a day (QID) | ORAL | 0 refills | Status: DC | PRN

## 2019-06-18 MED ORDER — MORPHINE SULFATE (PF) 4 MG/ML IV SOLN
4.0000 mg | Freq: Once | INTRAVENOUS | Status: AC
  Administered 2019-06-18: 04:00:00 4 mg via INTRAVENOUS
  Filled 2019-06-18: qty 1

## 2019-06-18 MED ORDER — IBUPROFEN 800 MG PO TABS: 800.0000 mg | ORAL_TABLET | Freq: Three times a day (TID) | ORAL | 0 refills | Status: DC | PRN

## 2019-06-18 MED ORDER — TAMSULOSIN HCL 0.4 MG PO CAPS: 0.4000 mg | ORAL_CAPSULE | Freq: Every day | ORAL | 0 refills | Status: DC

## 2019-06-18 NOTE — Discharge Instructions (Addendum)
I would take your Keflex until complete. Your urine did not appear infected today but you did have a 5 mm right proximal ureteral kidney stone. Please follow-up with urology as an outpatient.   You are being provided a prescription for opiates (also known as narcotics) for pain control.  Opiates can be addictive and should only be used when absolutely necessary for pain control when other alternatives do not work.  We recommend you only use them for the recommended amount of time and only as prescribed.  Please do not take with other sedative medications or alcohol.  Please do not drive, operate machinery, make important decisions while taking opiates.  Please note that these medications can be addictive and have high abuse potential.  Patients can become addicted to narcotics after only taking them for a few days.  Please keep these medications locked away from children, teenagers or any family members with history of substance abuse.  Narcotic pain medicine may also make you constipated.  You may use over-the-counter medications such as MiraLAX, Colace to prevent constipation.  If you become constipated you may use over-the-counter enemas as needed.  Itching and nausea are common side effects of narcotic pain medication.  If you develop uncontrolled vomiting or a rash, please stop these medications.

## 2019-06-18 NOTE — ED Provider Notes (Signed)
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  Was seen at urgent care and was started on Keflex for a urinary tract infection.  Reports her dysuria has improved but she is having worsening flank pain and intermittent nausea when the pain gets severe.  Reports pain intermittently radiates into the right lower quadrant.  She states it is colicky in nature, intermittently very sharp and severe.  No vomiting.  Did have some mild diarrhea but this has resolved.  No abnormal vaginal bleeding or discharge.  Last menstrual period was April 2.  No fever.  No previous history of previously passed kidney stones but states that she has been told on previous imaging may have seen a 5 mm stone in the right kidney.  No injury to her back.  Pain is not reproducible with palpation or movement.  On review of patient's chart, does appear she had a CT abdomen pelvis with contrast on 05/28/2018 that did show 5 mm right upper pole renal calculus that had been present since at least 2013.  ROS: See HPI Constitutional: no fever  Eyes: no drainage  ENT: no runny nose   Cardiovascular:  no chest pain  Resp: no SOB  GI: no vomiting GU:  dysuria Integumentary: no rash  Allergy: no hives  Musculoskeletal: no leg swelling  Neurological: no slurred speech ROS otherwise negative  PAST MEDICAL HISTORY/PAST SURGICAL HISTORY:  Past Medical History:  Diagnosis Date  . Adjustment disorder with mixed anxiety and depressed mood 12/17/2016  . Anemia   . Anemia affecting pregnancy, antepartum 12/17/2016  . Asthma   . Chorioamnionitis, delivered, current  hospitalization 12/17/2016  . Depression   . Hx of cellulitis of skin with lymphangitis    from last CS  . Hypothyroidism 12/17/2016  . Thyroid dysfunction in pregnancy in third trimester     MEDICATIONS:  Prior to Admission medications   Medication Sig Start Date End Date Taking? Authorizing Provider  acetaminophen (TYLENOL) 500 MG tablet Take 1,000 mg by mouth every 6 (six) hours as needed for mild pain, moderate pain or headache.     [provider]  albuterol (PROVENTIL HFA;VENTOLIN HFA) 108 (90 BASE) MCG/ACT inhaler Inhale 2 puffs into the lungs every 6 (six) hours as needed for wheezing.    [provider]  busPIRone (BUSPAR) 5 MG tablet Take 5 mg by mouth 2 (two) times daily.    Emergency, Nurse, RN  cephALEXin (KEFLEX) 500 MG capsule Take 1 capsule (500 mg total) by mouth 3 (three) times daily for 7 days. 06/14/19 06/21/19  Belinda Fisher, PA-C  cetirizine (ZYRTEC) 10 MG tablet Take 10 mg by mouth at bedtime.     [provider]  fluconazole (DIFLUCAN) 150 MG tablet Take 1 tablet (150 mg total) by mouth daily. Take second dose 72 hours later if symptoms still persists. 06/14/19   Cathie Hoops, Amy V, PA-C  gabapentin (NEURONTIN) 100 MG capsule Take 1 capsule (100 mg total) by mouth at bedtime as needed. 04/15/19   Huston Foley, MD  ketorolac (TORADOL) 10 MG tablet Take 1 tablet (10 mg total) by mouth every 6 (six) hours as needed. 06/14/19  Cathie Hoops, Amy V, PA-C  levothyroxine (SYNTHROID, LEVOTHROID) 50 MCG tablet Take 50 mcg by mouth every other day.  10/05/15   [provider]  levothyroxine (SYNTHROID, LEVOTHROID) 75 MCG tablet Take 75 mcg by mouth every other day.     [provider]  ondansetron (ZOFRAN ODT) 4 MG disintegrating tablet Take 1 tablet (4 mg total) by mouth every 8 (eight) hours as needed for nausea or vomiting. 06/14/19   Cathie Hoops, Amy V, PA-C  sertraline (ZOLOFT) 100 MG tablet Take 100 mg by mouth at bedtime.    [provider]    ALLERGIES:   No Known Allergies  SOCIAL HISTORY:  Social History   Tobacco Use  . Smoking status: Former Smoker    Packs/day: 0.50    Years: 7.00    Pack years: 3.50    Types: Cigarettes    Quit date: 03/04/2013    Years since quitting: 6.2  . Smokeless tobacco: Never Used  . Tobacco comment: does some vape  Substance Use Topics  . Alcohol use: Not Currently    Alcohol/week: 0.0 standard drinks    Comment: 9 a week    FAMILY HISTORY: Family History  Adopted: Yes  Problem Relation Age of Onset  . Uterine cancer Maternal Grandmother     EXAM: BP (!) 110/53 (BP Location: Left Arm)   Pulse 74   Temp 98.1 F (36.7 C) (Oral)   Resp 16   LMP 06/04/2019   SpO2 100%  CONSTITUTIONAL: Alert and oriented and responds appropriately to questions. Well-appearing; well-nourished HEAD: Normocephalic EYES: Conjunctivae clear, pupils appear equal, EOM appear intact ENT: normal nose; moist mucous membranes NECK: Supple, normal ROM CARD: RRR; S1 and S2 appreciated; no murmurs, no clicks, no rubs, no gallops RESP: Normal chest excursion without splinting or tachypnea; breath sounds clear and equal bilaterally; no wheezes, no rhonchi, no rales, no hypoxia or respiratory distress, speaking full sentences ABD/GI: Normal bowel sounds; non-distended; soft, non-tender, no rebound, no guarding, no peritoneal signs, no hepatosplenomegaly BACK:  The back appears normal, no midline spinal tenderness or step-off or deformity, ports to the right flank as a source of her pain without soft tissue swelling, redness or warmth, ecchymosis.  There is no reproducible tenderness on exam. EXT: Normal ROM in all joints; no deformity noted, no edema; no cyanosis SKIN: Normal color for age and race; warm; no rash on exposed skin NEURO: Moves all extremities equally PSYCH: The patient's mood and manner are appropriate.   MEDICAL DECISION MAKING: Patient here with flank pain that she reports radiates into the right groin.   Previous stone seen on imaging in the right kidney.  Reports pain is colicky in nature with intermittent nausea.  Urine here reviewed, interpreted and shows mild pyuria but rare bacteria.  No nitrites.  No blood.  Labs reviewed, interpreted and show no acute abnormality.  Pregnancy test negative.  No leukocytosis.  Normal creatinine.  Will obtain CT of the abdomen pelvis to evaluate for nephrolithiasis.  Abdominal exam otherwise is benign.  Doubt appendicitis.  Doubt cholecystitis or pancreatitis.  Will give Toradol, Zofran, IV fluids.  She is well-appearing here, well-hydrated, nontoxic, afebrile.  Does not appear to be in significant distress at this time.  ED PROGRESS: CT reviewed/interpreted and shows right-sided obstructive uropathy due to a 5 mm proximal right ureteral calculus. No significant improvement with Toradol. Will give morphine. Significant other at bedside.  4:10 AM  Pt feeling better after morphine.  Will discharge with urine strainer.  Will discharge with prescriptions of ibuprofen, Percocet, Zofran, Flomax.  Will give outpatient urology follow-up.  Discussed return precautions.  Significant other to drive patient home.  At this time, I do not feel there is any life-threatening condition present. I have reviewed, interpreted and discussed all results (EKG, imaging, lab, urine as appropriate) and exam findings with patient/family. I have reviewed nursing notes and appropriate previous records.  I feel the patient is safe to be discharged home without further emergent workup and can continue workup as an outpatient as needed. Discussed usual and customary return precautions. Patient/family verbalize understanding and are comfortable with this plan.  Outpatient follow-up has been provided as needed. All questions have been answered.     Megan Lara was evaluated in Emergency Department on 06/18/2019 for the symptoms described in the history of present illness. She was evaluated  in the context of the global COVID-19 pandemic, which necessitated consideration that the patient might be at risk for infection with the SARS-CoV-2 virus that causes COVID-19. Institutional protocols and algorithms that pertain to the evaluation of patients at risk for COVID-19 are in a state of rapid change based on information released by regulatory bodies including the CDC and federal and state organizations. These policies and algorithms were followed during the patient's care in the ED.      , Delice Bison, DO 06/18/19 [phone]

## 2019-06-21 ENCOUNTER — Other Ambulatory Visit: Payer: Self-pay

## 2019-06-21 ENCOUNTER — Ambulatory Visit (INDEPENDENT_AMBULATORY_CARE_PROVIDER_SITE_OTHER)
Admission: RE | Admit: 2019-06-21 | Discharge: 2019-06-21 | Disposition: A | Discharge status: Home / Self Care | Payer: 59 | Source: Ambulatory Visit

## 2019-06-21 DIAGNOSIS — N3001 Acute cystitis with hematuria: Secondary | ICD-10-CM

## 2019-06-21 DIAGNOSIS — R109 Unspecified abdominal pain: Secondary | ICD-10-CM | POA: Diagnosis not present

## 2019-06-21 DIAGNOSIS — R110 Nausea: Secondary | ICD-10-CM

## 2019-06-21 DIAGNOSIS — R10A1 Flank pain, right side: Secondary | ICD-10-CM

## 2019-06-21 DIAGNOSIS — N201 Calculus of ureter: Secondary | ICD-10-CM

## 2019-06-21 MED ORDER — ONDANSETRON 8 MG PO TBDP: 8.0000 mg | ORAL_TABLET | Freq: Three times a day (TID) | ORAL | 0 refills | Status: DC | PRN

## 2019-06-21 NOTE — ED Provider Notes (Signed)
Virtual Visit via Video Note:  Megan Lara  initiated request for Telemedicine visit with [facility] team. I connected with Megan Lara  on 06/21/2019 at 4:03 PM  for a synchronized telemedicine visit using a video enabled HIPPA compliant telemedicine application. I verified that I am speaking with Megan Lara  using two identifiers. Wallis Bamberg, PA-C  was physically located in a [facility] Urgent care site and Miliana Gangwer was located at a different location.   The limitations of evaluation and management by telemedicine as well as the availability of in-person appointments were discussed. Patient was informed that she  may incur a bill ( including co-pay) for this virtual visit encounter. Megan Lara  expressed understanding and gave verbal consent to proceed with virtual visit.     History of Present Illness:Megan Lara  is a 39 y.o. female presents with recheck on ongoing right flank pain. Had some improvement after her ER visit on 06/17/2019 but last night had recurrence of her flank pain, nausea. She did finish her antibiotic course.  Has been prescribed oxycodone consistently since the end of March for her renal colic. Has been using Flomax, hydrating very well. Patient works as a Production designer, theatre/television/film at a lab. Has a PCP. Has tried to contact [facility] for a consult. Has concerns about returning to work and needs a note for work.   ROS  Current Outpatient Medications  Medication Sig Dispense Refill  . acetaminophen (TYLENOL) 500 MG tablet Take 1,000 mg by mouth every 6 (six) hours as needed for mild pain, moderate pain or headache.     . albuterol (PROVENTIL HFA;VENTOLIN HFA) 108 (90 BASE) MCG/ACT inhaler Inhale 2 puffs into the lungs every 6 (six) hours as needed for wheezing.    . busPIRone (BUSPAR) 5 MG tablet Take 5 mg by mouth 2 (two) times daily.    . cephALEXin (KEFLEX) 500 MG capsule Take 1 capsule (500 mg  total) by mouth 3 (three) times daily for 7 days. 21 capsule 0  . cetirizine (ZYRTEC) 10 MG tablet Take 10 mg by mouth at bedtime.     . gabapentin (NEURONTIN) 100 MG capsule Take 1 capsule (100 mg total) by mouth at bedtime as needed. (Patient taking differently: Take 100 mg by mouth at bedtime as needed (for pain). ) 30 capsule 3  . ibuprofen (ADVIL) 800 MG tablet Take 1 tablet (800 mg total) by mouth every 8 (eight) hours as needed for mild pain. 30 tablet 0  . levothyroxine (SYNTHROID, LEVOTHROID) 50 MCG tablet Take 50 mcg by mouth every other day.     . levothyroxine (SYNTHROID, LEVOTHROID) 75 MCG tablet Take 75 mcg by mouth every other day.     . ondansetron (ZOFRAN ODT) 4 MG disintegrating tablet Take 1 tablet (4 mg total) by mouth every 6 (six) hours as needed. 20 tablet 0  . oxyCODONE-acetaminophen (PERCOCET/ROXICET) 5-325 MG tablet Take 2 tablets by mouth every 6 (six) hours as needed for severe pain. 16 tablet 0  . sertraline (ZOLOFT) 100 MG tablet Take 100 mg by mouth at bedtime.    . tamsulosin (FLOMAX) 0.4 MG CAPS capsule Take 1 capsule (0.4 mg total) by mouth daily. Take until kidney stone passes. 30 capsule 0   No current facility-administered medications for this encounter.     No Known Allergies   Past Medical History:  Diagnosis Date  . Adjustment disorder with mixed anxiety and depressed mood 12/17/2016  . Anemia   .  Anemia affecting pregnancy, antepartum 12/17/2016  . Asthma   . Chorioamnionitis, delivered, current hospitalization 12/17/2016  . Depression   . Hx of cellulitis of skin with lymphangitis    from last CS  . Hypothyroidism 12/17/2016  . Thyroid dysfunction in pregnancy in third trimester     Past Surgical History:  Procedure Laterality Date  . CESAREAN SECTION N/A 12/16/2016   Procedure: CESAREAN SECTION;  Surgeon: Azucena Fallen, MD;  Location: [facility];  Service: Obstetrics;  Laterality: N/A;  . CESAREAN SECTION WITH BILATERAL TUBAL  LIGATION Bilateral 05/25/2018   Procedure: Repeat CESAREAN SECTION WITH BILATERAL TUBAL LIGATION;  Surgeon: Azucena Fallen, MD;  Location: [facility];  Service: Obstetrics;  Laterality: Bilateral;  EDD: 05/22/18  . GANGLION CYST EXCISION Left 4/15   3 rd toe  . HERNIA REPAIR Right 1991  . IR ANGIOGRAM PELVIS SELECTIVE OR SUPRASELECTIVE  05/28/2018  . IR ANGIOGRAM SELECTIVE EACH ADDITIONAL VESSEL  05/28/2018  . IR ANGIOGRAM SELECTIVE EACH ADDITIONAL VESSEL  05/28/2018  . IR ANGIOGRAM SELECTIVE EACH ADDITIONAL VESSEL  05/28/2018  . IR EMBO ART  VEN HEMORR LYMPH EXTRAV  INC GUIDE ROADMAPPING  05/28/2018  . IR US GUIDE VASC ACCESS RIGHT  05/28/2018  . TONSILLECTOMY  1997      Observations/Objective: Physical Exam Constitutional:      General: She is not in acute distress.    Appearance: Normal appearance. She is well-developed. She is not ill-appearing, toxic-appearing or diaphoretic.     Comments: Appears lethargic.  Eyes:     Extraocular Movements: Extraocular movements intact.  Pulmonary:     Effort: Pulmonary effort is normal.  Neurological:     General: No focal deficit present.     Mental Status: She is alert and oriented to person, place, and time.  Psychiatric:        Mood and Affect: Mood normal.        Behavior: Behavior normal.        Thought Content: Thought content normal.        Judgment: Judgment normal.      Assessment and Plan:  PDMP not reviewed this encounter.  1. Right flank pain   2. Acute hemorrhagic cystitis   3. Nausea without vomiting   4. Right ureteral stone    Recommended patient use her pain medications, continue Flomax and hydrate very well. Patient to increase her Zofran to 8 mg once every 8 hours as needed.  Strict ER precautions.  Encouraged patient to continue trying to set up a consult with [facility].  Note for work provided to the patient.  I did recommend she follow-up with her PCP for short-term disability and FMLA.   Follow Up  Instructions:    I discussed the assessment and treatment plan with the patient. The patient was provided an opportunity to ask questions and all were answered. The patient agreed with the plan and demonstrated an understanding of the instructions.   The patient was advised to call back or seek an in-person evaluation if the symptoms worsen or if the condition fails to improve as anticipated.  I provided 15 minutes of non-face-to-face time during this encounter.    Jaynee Eagles, PA-C  06/21/2019 4:03 PM         Jaynee Eagles, PA-C 06/22/19 [id number]

## 2019-06-21 NOTE — Discharge Instructions (Signed)
Schedule Tylenol at 500mg -650mg  once every 6 hours as needed. Make sure you use oxycodone 1 tablet once every 6 hours for breakthrough pain. I encourage you to continue trying to set up an appointment with [facility] for definitive treatment of your obstructing ureteral stone. Please continue to hydrate with at least 2 liters of water daily, take Flomax. Contact your PCP for further recommendations regarding long term leave from work and .

## 2019-06-22 ENCOUNTER — Other Ambulatory Visit: Payer: Self-pay | Admitting: Urology

## 2019-06-22 DIAGNOSIS — N201 Calculus of ureter: Secondary | ICD-10-CM

## 2019-06-22 NOTE — H&P (Signed)
CC/HPI: 1 - Right Ureteral STone - 55mm Rt mid stone just above L4 TP by ER CT 06/18/19 on eval flank pain. Stone is solitary. SSD 12cm. UA without infectious parameters. Cr <1. Nl CBC.   KUB 4/20 with sotne L3-L4 interspace.   PMH sig for axiety (SSRI, buspar), Rt inghinal hernia repair as child. No CV disease / blood thinners. 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   Today "Megan Lara" is seen as new patient for right ureteral stone. No interval passage.     ALLERGIES: None   MEDICATIONS: Percocet  Zyrtec  Buspar  Flomax  Gabapentin  Synthroid  Zofran  Zoloft     GU PSH: None   NON-GU PSH: Cesarean Delivery Hernia Repair Tonsillectomy..     GU PMH: None   NON-GU PMH: Anxiety Asthma Depression Hypothyroidism    FAMILY HISTORY: 1 Daughter - Other 1 son - Other   SOCIAL HISTORY: Marital Status: Married Preferred Language: English; Ethnicity: Not Hispanic Or Latino; Race: White Current Smoking Status: Patient does not smoke anymore. Has not smoked since 06/02/2016. Smoked for 4 years. Smoked 1 pack per day.   Tobacco Use Assessment Completed: Used Tobacco in last 30 days? Drinks 1 drink per week.  Drinks 2 caffeinated drinks per day. Patient's occupation Mudlogger.    REVIEW OF SYSTEMS:    GU Review Female:   Patient reports get up at night to urinate, frequent urination, and burning /pain with urination. Patient denies trouble starting your stream, have to strain to urinate, stream starts and stops, being pregnant, leakage of urine, and hard to postpone urination.  Gastrointestinal (Upper):   Patient reports nausea. Patient denies vomiting and indigestion/ heartburn.  Gastrointestinal (Lower):   Patient denies diarrhea and constipation.  Constitutional:   Patient denies fever, night sweats, weight loss, and fatigue.  Skin:   Patient denies skin rash/ lesion and itching.  Eyes:   Patient denies blurred vision and double vision.  Ears/  Nose/ Throat:   Patient denies sore throat and sinus problems.  Hematologic/Lymphatic:   Patient denies swollen glands and easy bruising.  Cardiovascular:   Patient denies leg swelling and chest pains.  Respiratory:   Patient denies cough and shortness of breath.  Endocrine:   Patient denies excessive thirst.  Musculoskeletal:   Patient reports back pain. Patient denies joint pain.  Neurological:   Patient denies headaches and dizziness.  Psychologic:   Patient reports depression and anxiety.    Notes: Hx of UTI    VITAL SIGNS:    Weight 140 lb / 63.5 kg  Height 66 in / 167.64 cm  BP 122/83 mmHg  Pulse 80 /min  Temperature 98.4 F / 36.8 C  BMI 22.6 kg/m   GU PHYSICAL EXAMINATION:    Bladder: Normal to palpation, no tenderness, no mass, normal size.   MULTI-SYSTEM PHYSICAL EXAMINATION:    Constitutional: Well-nourished. No physical deformities. Normally developed. Good grooming.  Neck: Neck symmetrical, not swollen. Normal tracheal position.  Respiratory: No labored breathing, no use of accessory muscles.   Cardiovascular: Normal temperature, normal extremity pulses, no swelling, no varicosities.  Lymphatic: No enlargement of neck, axillae, groin.  Skin: No paleness, no jaundice, no cyanosis. No lesion, no ulcer, no rash.  Neurologic / Psychiatric: Oriented to time, oriented to place, oriented to person. No depression, no anxiety, no agitation.  Gastrointestinal: No mass, no tenderness, no rigidity, non obese abdomen. Mild Rt CVAT at present.   Eyes: Normal conjunctivae.  Normal eyelids.  Ears, Nose, Mouth, and Throat: Left ear no scars, no lesions, no masses. Right ear no scars, no lesions, no masses. Nose no scars, no lesions, no masses. Normal hearing. Normal lips.  Musculoskeletal: Normal gait and station of head and neck.     Complexity of Data:  Lab Test Review:   BMP, CBC with Diff  Records Review:   Previous Hospital Records, Previous Patient Records  Urine Test Review:    Urinalysis  X-Ray Review: C.T. Abdomen/Pelvis: Reviewed Films. Reviewed Report. Discussed With Patient.     PROCEDURES:         KUB - [id number]  A single view of the abdomen is obtained.      Patient confirmed No Neulasta OnPro Device.  Rt ureteral stone at L3-4 interspace.            Urinalysis Dipstick Dipstick Cont'd  Color: Yellow Bilirubin: Neg mg/dL  Appearance: Clear Ketones: Neg mg/dL  Specific Gravity: 5.027 Blood: Neg ery/uL  pH: 6.0 Protein: Neg mg/dL  Glucose: Neg mg/dL Urobilinogen: 0.2 mg/dL    Nitrites: Neg    Leukocyte Esterase: Neg leu/uL    ASSESSMENT:      ICD-10 Details  1 GU:   Ureteral calculus - N20.1 Right, Undiagnosed New Problem   PLAN:  Discussed options of medical therapy (50% success rate at best), SWL (90% single stage success), or ureteroscopy 95% chance single stage success. I rec SWL this week as she has favorable parameters. Risks, benefits, peri-treatment course discussed.

## 2019-06-23 ENCOUNTER — Other Ambulatory Visit (HOSPITAL_COMMUNITY)
Admission: RE | Admit: 2019-06-23 | Discharge: 2019-06-23 | Disposition: A | Discharge status: Home / Self Care | Payer: 59 | Source: Ambulatory Visit | Attending: Urology | Admitting: Urology

## 2019-06-23 DIAGNOSIS — Z20822 Contact with and (suspected) exposure to covid-19: Secondary | ICD-10-CM | POA: Insufficient documentation

## 2019-06-23 DIAGNOSIS — Z01812 Encounter for preprocedural laboratory examination: Secondary | ICD-10-CM | POA: Insufficient documentation

## 2019-06-23 LAB — SARS CORONAVIRUS 2 (TAT 6-24 HRS): SARS Coronavirus 2: NEGATIVE

## 2019-06-23 NOTE — Progress Notes (Signed)
Talked with patient. Went over instructions for [employer]. NPO after MN. May take am Meds if needed.

## 2019-06-24 ENCOUNTER — Ambulatory Visit (HOSPITAL_COMMUNITY): Payer: 59

## 2019-06-24 ENCOUNTER — Encounter (HOSPITAL_BASED_OUTPATIENT_CLINIC_OR_DEPARTMENT_OTHER): Payer: Self-pay | Admitting: Urology

## 2019-06-24 ENCOUNTER — Other Ambulatory Visit: Payer: Self-pay

## 2019-06-24 ENCOUNTER — Encounter (HOSPITAL_BASED_OUTPATIENT_CLINIC_OR_DEPARTMENT_OTHER)
Admission: RE | Disposition: A | Discharge status: Home / Self Care | Payer: Self-pay | Source: Home / Self Care | Attending: Urology

## 2019-06-24 ENCOUNTER — Ambulatory Visit (HOSPITAL_BASED_OUTPATIENT_CLINIC_OR_DEPARTMENT_OTHER)
Admission: RE | Admit: 2019-06-24 | Discharge: 2019-06-24 | Disposition: A | Discharge status: Home / Self Care | Payer: 59 | Source: Home / Self Care | Attending: Urology | Admitting: Urology

## 2019-06-24 ENCOUNTER — Other Ambulatory Visit: Payer: Self-pay | Admitting: Urology

## 2019-06-24 DIAGNOSIS — N200 Calculus of kidney: Secondary | ICD-10-CM

## 2019-06-24 DIAGNOSIS — N201 Calculus of ureter: Secondary | ICD-10-CM | POA: Insufficient documentation

## 2019-06-24 DIAGNOSIS — F329 Major depressive disorder, single episode, unspecified: Secondary | ICD-10-CM | POA: Insufficient documentation

## 2019-06-24 DIAGNOSIS — Z87891 Personal history of nicotine dependence: Secondary | ICD-10-CM | POA: Insufficient documentation

## 2019-06-24 DIAGNOSIS — J45909 Unspecified asthma, uncomplicated: Secondary | ICD-10-CM | POA: Insufficient documentation

## 2019-06-24 DIAGNOSIS — E039 Hypothyroidism, unspecified: Secondary | ICD-10-CM | POA: Insufficient documentation

## 2019-06-24 DIAGNOSIS — Z79899 Other long term (current) drug therapy: Secondary | ICD-10-CM | POA: Insufficient documentation

## 2019-06-24 DIAGNOSIS — F419 Anxiety disorder, unspecified: Secondary | ICD-10-CM | POA: Insufficient documentation

## 2019-06-24 HISTORY — PX: EXTRACORPOREAL SHOCK WAVE LITHOTRIPSY: SHX1557

## 2019-06-24 LAB — POCT PREGNANCY, URINE: Preg Test, Ur: NEGATIVE

## 2019-06-24 SURGERY — LITHOTRIPSY, ESWL
Anesthesia: LOCAL | Laterality: Right

## 2019-06-24 MED ORDER — DIPHENHYDRAMINE HCL 25 MG PO CAPS
25.0000 mg | ORAL_CAPSULE | ORAL | Status: AC
  Administered 2019-06-24: 25 mg via ORAL

## 2019-06-24 MED ORDER — DIAZEPAM 5 MG PO TABS
ORAL_TABLET | ORAL | Status: AC
  Filled 2019-06-24: qty 2

## 2019-06-24 MED ORDER — SODIUM CHLORIDE 0.9 % IV SOLN: INTRAVENOUS | Status: DC

## 2019-06-24 MED ORDER — CIPROFLOXACIN HCL 500 MG PO TABS
ORAL_TABLET | ORAL | Status: AC
  Filled 2019-06-24: qty 1

## 2019-06-24 MED ORDER — CIPROFLOXACIN HCL 500 MG PO TABS
500.0000 mg | ORAL_TABLET | ORAL | Status: AC
  Administered 2019-06-24: 500 mg via ORAL

## 2019-06-24 MED ORDER — DIPHENHYDRAMINE HCL 25 MG PO CAPS
ORAL_CAPSULE | ORAL | Status: AC
  Filled 2019-06-24: qty 1

## 2019-06-24 MED ORDER — DIAZEPAM 5 MG PO TABS
10.0000 mg | ORAL_TABLET | ORAL | Status: AC
  Administered 2019-06-24: 10 mg via ORAL

## 2019-06-24 NOTE — Discharge Instructions (Signed)
Post Anesthesia Home Care Instructions  Activity: Get plenty of rest for the remainder of the day. A responsible adult should stay with you for 24 hours following the procedure.  For the next 24 hours, DO NOT: -Drive a car -Operate machinery -Drink alcoholic beverages -Take any medication unless instructed by your physician -Make any legal decisions or sign important papers.  Meals: Start with liquid foods such as gelatin or soup. Progress to regular foods as tolerated. Avoid greasy, spicy, heavy foods. If nausea and/or vomiting occur, drink only clear liquids until the nausea and/or vomiting subsides. Call your physician if vomiting continues.  Special Instructions/Symptoms: Your throat may feel dry or sore from the anesthesia or the breathing tube placed in your throat during surgery. If this causes discomfort, gargle with warm salt water. The discomfort should disappear within 24 hours.  If you had a scopolamine patch placed behind your ear for the management of post- operative nausea and/or vomiting:  1. The medication in the patch is effective for 72 hours, after which it should be removed.  Wrap patch in a tissue and discard in the trash. Wash hands thoroughly with soap and water. 2. You may remove the patch earlier than 72 hours if you experience unpleasant side effects which may include dry mouth, dizziness or visual disturbances. 3. Avoid touching the patch. Wash your hands with soap and water after contact with the patch.   Lithotripsy, Care After This sheet gives you information about how to care for yourself after your procedure. Your health care provider may also give you more specific instructions. If you have problems or questions, contact your health care provider. What can I expect after the procedure? After the procedure, it is common to have:  Some blood in your urine. This should only last for a few days.  Soreness in your back, sides, or upper abdomen for a few  days.  Blotches or bruises on your back where the pressure wave entered the skin.  Pain, discomfort, or nausea when pieces (fragments) of the kidney stone move through the tube that carries urine from the kidney to the bladder (ureter). Stone fragments may pass soon after the procedure, but they may continue to pass for up to 4-8 weeks. ? If you have severe pain or nausea, contact your health care provider. This may be caused by a large stone that was not broken up, and this may mean that you need more treatment.  Some pain or discomfort during urination.  Some pain or discomfort in the lower abdomen or (in men) at the base of the penis. Follow these instructions at home: Medicines  Take over-the-counter and prescription medicines only as told by your health care provider.  If you were prescribed an antibiotic medicine, take it as told by your health care provider. Do not stop taking the antibiotic even if you start to feel better.  Do not drive for 24 hours if you were given a medicine to help you relax (sedative).  Do not drive or use heavy machinery while taking prescription pain medicine. Eating and drinking      Drink enough water and fluids to keep your urine clear or pale yellow. This helps any remaining pieces of the stone to pass. It can also help prevent new stones from forming.  Eat plenty of fresh fruits and vegetables.  Follow instructions from your health care provider about eating and drinking restrictions. You may be instructed: ? To reduce how much salt (sodium) you eat   or drink. Check ingredients and nutrition facts on packaged foods and beverages. ? To reduce how much meat you eat.  Eat the recommended amount of calcium for your age and gender. Ask your health care provider how much calcium you should have. General instructions  Get plenty of rest.  Most people can resume normal activities 1-2 days after the procedure. Ask your health care provider what  activities are safe for you.  Your health care provider may direct you to lie in a certain position (postural drainage) and tap firmly (percuss) over your kidney area to help stone fragments pass. Follow instructions as told by your health care provider.  If directed, strain all urine through the strainer that was provided by your health care provider. ? Keep all fragments for your health care provider to see. Any stones that are found may be sent to a medical lab for examination. The stone may be as small as a grain of salt.  Keep all follow-up visits as told by your health care provider. This is important. Contact a health care provider if:  You have pain that is severe or does not get better with medicine.  You have nausea that is severe or does not go away.  You have blood in your urine longer than your health care provider told you to expect.  You have more blood in your urine.  You have pain during urination that does not go away.  You urinate more frequently than usual and this does not go away.  You develop a rash or any other possible signs of an allergic reaction. Get help right away if:  You have severe pain in your back, sides, or upper abdomen.  You have severe pain while urinating.  Your urine is very dark red.  You have blood in your stool (feces).  You cannot pass any urine at all.  You feel a strong urge to urinate after emptying your bladder.  You have a fever or chills.  You develop shortness of breath, difficulty breathing, or chest pain.  You have severe nausea that leads to persistent vomiting.  You faint. Summary  After this procedure, it is common to have some pain, discomfort, or nausea when pieces (fragments) of the kidney stone move through the tube that carries urine from the kidney to the bladder (ureter). If this pain or nausea is severe, however, you should contact your health care provider.  Most people can resume normal activities 1-2  days after the procedure. Ask your health care provider what activities are safe for you.  Drink enough water and fluids to keep your urine clear or pale yellow. This helps any remaining pieces of the stone to pass, and it can help prevent new stones from forming.  If directed, strain your urine and keep all fragments for your health care provider to see. Fragments or stones may be as small as a grain of salt.  Get help right away if you have severe pain in your back, sides, or upper abdomen or have severe pain while urinating. This information is not intended to replace advice given to you by your health care provider. Make sure you discuss any questions you have with your health care provider. Document Revised: 06/01/2018 Document Reviewed: 01/10/2016 Elsevier Patient Education  2020 [employer]  

## 2019-06-24 NOTE — Op Note (Signed)
See [facility] OP note scanned into chart. Also because of the size, density, location and other factors that cannot be anticipated I feel this will likely be a staged procedure. This fact supersedes any indication in the scanned [facility] operative note to the contrary.  

## 2019-06-25 ENCOUNTER — Emergency Department (HOSPITAL_COMMUNITY): Payer: 59

## 2019-06-25 ENCOUNTER — Other Ambulatory Visit: Payer: Self-pay

## 2019-06-25 ENCOUNTER — Inpatient Hospital Stay (HOSPITAL_COMMUNITY)
Admission: EM | Admit: 2019-06-25 | Discharge: 2019-07-01 | DRG: 872 | Disposition: A | Discharge status: Home / Self Care | Payer: 59 | Attending: Family Medicine | Admitting: Family Medicine

## 2019-06-25 DIAGNOSIS — R739 Hyperglycemia, unspecified: Secondary | ICD-10-CM | POA: Diagnosis present

## 2019-06-25 DIAGNOSIS — D7589 Other specified diseases of blood and blood-forming organs: Secondary | ICD-10-CM | POA: Diagnosis present

## 2019-06-25 DIAGNOSIS — Z20822 Contact with and (suspected) exposure to covid-19: Secondary | ICD-10-CM | POA: Diagnosis present

## 2019-06-25 DIAGNOSIS — R000 Tachycardia, unspecified: Secondary | ICD-10-CM

## 2019-06-25 DIAGNOSIS — D539 Nutritional anemia, unspecified: Secondary | ICD-10-CM | POA: Diagnosis present

## 2019-06-25 DIAGNOSIS — Z79899 Other long term (current) drug therapy: Secondary | ICD-10-CM

## 2019-06-25 DIAGNOSIS — Z79890 Hormone replacement therapy (postmenopausal): Secondary | ICD-10-CM

## 2019-06-25 DIAGNOSIS — N390 Urinary tract infection, site not specified: Secondary | ICD-10-CM

## 2019-06-25 DIAGNOSIS — E039 Hypothyroidism, unspecified: Secondary | ICD-10-CM | POA: Diagnosis present

## 2019-06-25 DIAGNOSIS — Z8619 Personal history of other infectious and parasitic diseases: Secondary | ICD-10-CM

## 2019-06-25 DIAGNOSIS — R519 Headache, unspecified: Secondary | ICD-10-CM

## 2019-06-25 DIAGNOSIS — R509 Fever, unspecified: Secondary | ICD-10-CM | POA: Diagnosis present

## 2019-06-25 DIAGNOSIS — D72829 Elevated white blood cell count, unspecified: Secondary | ICD-10-CM | POA: Diagnosis present

## 2019-06-25 DIAGNOSIS — E871 Hypo-osmolality and hyponatremia: Secondary | ICD-10-CM | POA: Diagnosis present

## 2019-06-25 DIAGNOSIS — E872 Acidosis: Secondary | ICD-10-CM | POA: Diagnosis present

## 2019-06-25 DIAGNOSIS — R5082 Postprocedural fever: Secondary | ICD-10-CM

## 2019-06-25 DIAGNOSIS — F329 Major depressive disorder, single episode, unspecified: Secondary | ICD-10-CM | POA: Diagnosis present

## 2019-06-25 DIAGNOSIS — N202 Calculus of kidney with calculus of ureter: Secondary | ICD-10-CM | POA: Diagnosis present

## 2019-06-25 DIAGNOSIS — G43909 Migraine, unspecified, not intractable, without status migrainosus: Secondary | ICD-10-CM | POA: Diagnosis present

## 2019-06-25 DIAGNOSIS — Z87442 Personal history of urinary calculi: Secondary | ICD-10-CM

## 2019-06-25 DIAGNOSIS — A419 Sepsis, unspecified organism: Principal | ICD-10-CM | POA: Diagnosis present

## 2019-06-25 DIAGNOSIS — J449 Chronic obstructive pulmonary disease, unspecified: Secondary | ICD-10-CM | POA: Diagnosis present

## 2019-06-25 DIAGNOSIS — J45909 Unspecified asthma, uncomplicated: Secondary | ICD-10-CM | POA: Diagnosis present

## 2019-06-25 DIAGNOSIS — Z87891 Personal history of nicotine dependence: Secondary | ICD-10-CM

## 2019-06-25 NOTE — ED Triage Notes (Signed)
Pt c/o of right side flank pain . Pt had lipotrophy on Thursday. Pain and fever has not resolved after taking a percocet tonight. PCP recommended the ER

## 2019-06-26 ENCOUNTER — Encounter (HOSPITAL_COMMUNITY): Payer: Self-pay

## 2019-06-26 ENCOUNTER — Emergency Department (HOSPITAL_COMMUNITY): Payer: 59

## 2019-06-26 DIAGNOSIS — Z79890 Hormone replacement therapy (postmenopausal): Secondary | ICD-10-CM | POA: Diagnosis not present

## 2019-06-26 DIAGNOSIS — D72829 Elevated white blood cell count, unspecified: Secondary | ICD-10-CM | POA: Diagnosis present

## 2019-06-26 DIAGNOSIS — R000 Tachycardia, unspecified: Secondary | ICD-10-CM | POA: Diagnosis present

## 2019-06-26 DIAGNOSIS — R509 Fever, unspecified: Secondary | ICD-10-CM | POA: Diagnosis present

## 2019-06-26 DIAGNOSIS — G43909 Migraine, unspecified, not intractable, without status migrainosus: Secondary | ICD-10-CM | POA: Diagnosis present

## 2019-06-26 DIAGNOSIS — E039 Hypothyroidism, unspecified: Secondary | ICD-10-CM | POA: Diagnosis present

## 2019-06-26 DIAGNOSIS — N390 Urinary tract infection, site not specified: Secondary | ICD-10-CM | POA: Diagnosis present

## 2019-06-26 DIAGNOSIS — Z79899 Other long term (current) drug therapy: Secondary | ICD-10-CM | POA: Diagnosis not present

## 2019-06-26 DIAGNOSIS — D539 Nutritional anemia, unspecified: Secondary | ICD-10-CM | POA: Diagnosis present

## 2019-06-26 DIAGNOSIS — F329 Major depressive disorder, single episode, unspecified: Secondary | ICD-10-CM | POA: Diagnosis present

## 2019-06-26 DIAGNOSIS — Z20822 Contact with and (suspected) exposure to covid-19: Secondary | ICD-10-CM | POA: Diagnosis present

## 2019-06-26 DIAGNOSIS — E871 Hypo-osmolality and hyponatremia: Secondary | ICD-10-CM | POA: Diagnosis present

## 2019-06-26 DIAGNOSIS — Z87891 Personal history of nicotine dependence: Secondary | ICD-10-CM | POA: Diagnosis not present

## 2019-06-26 DIAGNOSIS — N202 Calculus of kidney with calculus of ureter: Secondary | ICD-10-CM | POA: Diagnosis present

## 2019-06-26 DIAGNOSIS — R739 Hyperglycemia, unspecified: Secondary | ICD-10-CM | POA: Diagnosis present

## 2019-06-26 DIAGNOSIS — Z87442 Personal history of urinary calculi: Secondary | ICD-10-CM | POA: Diagnosis not present

## 2019-06-26 DIAGNOSIS — R7881 Bacteremia: Secondary | ICD-10-CM | POA: Diagnosis not present

## 2019-06-26 DIAGNOSIS — E872 Acidosis: Secondary | ICD-10-CM | POA: Diagnosis present

## 2019-06-26 DIAGNOSIS — J45909 Unspecified asthma, uncomplicated: Secondary | ICD-10-CM | POA: Diagnosis present

## 2019-06-26 DIAGNOSIS — Z8619 Personal history of other infectious and parasitic diseases: Secondary | ICD-10-CM | POA: Diagnosis not present

## 2019-06-26 DIAGNOSIS — A419 Sepsis, unspecified organism: Secondary | ICD-10-CM | POA: Diagnosis present

## 2019-06-26 DIAGNOSIS — J449 Chronic obstructive pulmonary disease, unspecified: Secondary | ICD-10-CM | POA: Diagnosis present

## 2019-06-26 DIAGNOSIS — D7589 Other specified diseases of blood and blood-forming organs: Secondary | ICD-10-CM | POA: Diagnosis present

## 2019-06-26 LAB — I-STAT CHEM 8, ED
BUN: 5 mg/dL — ABNORMAL LOW (ref 6–20)
Calcium, Ion: 1.12 mmol/L — ABNORMAL LOW (ref 1.15–1.40)
Chloride: 100 mmol/L (ref 98–111)
Creatinine, Ser: 0.9 mg/dL (ref 0.44–1.00)
Glucose, Bld: 134 mg/dL — ABNORMAL HIGH (ref 70–99)
HCT: 35 % — ABNORMAL LOW (ref 36.0–46.0)
Hemoglobin: 11.9 g/dL — ABNORMAL LOW (ref 12.0–15.0)
Potassium: 3.9 mmol/L (ref 3.5–5.1)
Sodium: 131 mmol/L — ABNORMAL LOW (ref 135–145)
TCO2: 28 mmol/L (ref 22–32)

## 2019-06-26 LAB — COMPREHENSIVE METABOLIC PANEL
ALT: 24 U/L (ref 0–44)
AST: 24 U/L (ref 15–41)
Albumin: 3.7 g/dL (ref 3.5–5.0)
Alkaline Phosphatase: 56 U/L (ref 38–126)
Anion gap: 8 (ref 5–15)
BUN: 6 mg/dL (ref 6–20)
CO2: 23 mmol/L (ref 22–32)
Calcium: 8.3 mg/dL — ABNORMAL LOW (ref 8.9–10.3)
Chloride: 101 mmol/L (ref 98–111)
Creatinine, Ser: 0.77 mg/dL (ref 0.44–1.00)
GFR calc Af Amer: >60 mL/min (ref >60)
GFR calc non Af Amer: >60 mL/min (ref >60)
Glucose, Bld: 135 mg/dL — ABNORMAL HIGH (ref 70–99)
Potassium: 4 mmol/L (ref 3.5–5.1)
Sodium: 132 mmol/L — ABNORMAL LOW (ref 135–145)
Total Bilirubin: 0.8 mg/dL (ref 0.3–1.2)
Total Protein: 7.3 g/dL (ref 6.5–8.1)

## 2019-06-26 LAB — RESPIRATORY PANEL BY RT PCR (FLU A&B, COVID)
Influenza A by PCR: NEGATIVE
Influenza B by PCR: NEGATIVE
SARS Coronavirus 2 by RT PCR: NEGATIVE

## 2019-06-26 LAB — URINALYSIS, ROUTINE W REFLEX MICROSCOPIC
Bilirubin Urine: NEGATIVE
Glucose, UA: NEGATIVE mg/dL
Ketones, ur: NEGATIVE mg/dL
Leukocytes,Ua: NEGATIVE
Nitrite: NEGATIVE
Protein, ur: 30 mg/dL — AB
RBC / HPF: >50 RBC/hpf — ABNORMAL HIGH (ref 0–5)
Specific Gravity, Urine: 1.009 (ref 1.005–1.030)
pH: 6 (ref 5.0–8.0)

## 2019-06-26 LAB — CBC WITH DIFFERENTIAL/PLATELET
Abs Immature Granulocytes: 0.05 10*3/uL (ref 0.00–0.07)
Basophils Absolute: 0 10*3/uL (ref 0.0–0.1)
Basophils Relative: 0 %
Eosinophils Absolute: 0 10*3/uL (ref 0.0–0.5)
Eosinophils Relative: 0 %
HCT: 36.5 % (ref 36.0–46.0)
Hemoglobin: 11.7 g/dL — ABNORMAL LOW (ref 12.0–15.0)
Immature Granulocytes: 0 %
Lymphocytes Relative: 9 %
Lymphs Abs: 1 10*3/uL (ref 0.7–4.0)
MCH: 30.9 pg (ref 26.0–34.0)
MCHC: 32.1 g/dL (ref 30.0–36.0)
MCV: 96.3 fL (ref 80.0–100.0)
Monocytes Absolute: 1.1 10*3/uL — ABNORMAL HIGH (ref 0.1–1.0)
Monocytes Relative: 10 %
Neutro Abs: 9 10*3/uL — ABNORMAL HIGH (ref 1.7–7.7)
Neutrophils Relative %: 81 %
Platelets: 309 10*3/uL (ref 150–400)
RBC: 3.79 MIL/uL — ABNORMAL LOW (ref 3.87–5.11)
RDW: 12.3 % (ref 11.5–15.5)
WBC: 11.2 10*3/uL — ABNORMAL HIGH (ref 4.0–10.5)
nRBC: 0 % (ref 0.0–0.2)

## 2019-06-26 LAB — LACTIC ACID, PLASMA: Lactic Acid, Venous: 0.8 mmol/L (ref 0.5–1.9)

## 2019-06-26 LAB — I-STAT BETA HCG BLOOD, ED (MC, WL, AP ONLY): I-stat hCG, quantitative: 6.3 m[IU]/mL — ABNORMAL HIGH (ref <5)

## 2019-06-26 LAB — PROTIME-INR
INR: 1 (ref 0.8–1.2)
Prothrombin Time: 13.3 seconds (ref 11.4–15.2)

## 2019-06-26 MED ORDER — SODIUM CHLORIDE 0.9 % IV SOLN
1.0000 g | Freq: Once | INTRAVENOUS | Status: AC
  Administered 2019-06-26: 1 g via INTRAVENOUS
  Filled 2019-06-26: qty 10

## 2019-06-26 MED ORDER — SERTRALINE HCL 100 MG PO TABS
100.0000 mg | ORAL_TABLET | Freq: Every day | ORAL | Status: DC
  Administered 2019-06-26 – 2019-06-30 (×5): 100 mg via ORAL
  Filled 2019-06-26 (×6): qty 1

## 2019-06-26 MED ORDER — FENTANYL CITRATE (PF) 100 MCG/2ML IJ SOLN
50.0000 ug | Freq: Once | INTRAMUSCULAR | Status: AC
  Administered 2019-06-26: 50 ug via INTRAVENOUS
  Filled 2019-06-26: qty 2

## 2019-06-26 MED ORDER — LEVOTHYROXINE SODIUM 75 MCG PO TABS
75.0000 ug | ORAL_TABLET | Freq: Every day | ORAL | Status: DC
  Administered 2019-06-27 – 2019-07-01 (×4): 75 ug via ORAL
  Filled 2019-06-26 (×4): qty 1

## 2019-06-26 MED ORDER — VANCOMYCIN HCL IN DEXTROSE 750-5 MG/150ML-% IV SOLN
750.0000 mg | Freq: Two times a day (BID) | INTRAVENOUS | Status: DC
  Filled 2019-06-26: qty 150

## 2019-06-26 MED ORDER — SODIUM CHLORIDE 0.9 % IV SOLN
1000.0000 mL | INTRAVENOUS | Status: DC
  Administered 2019-06-26: 1000 mL via INTRAVENOUS

## 2019-06-26 MED ORDER — SODIUM CHLORIDE (PF) 0.9 % IJ SOLN
INTRAMUSCULAR | Status: AC
  Filled 2019-06-26: qty 50

## 2019-06-26 MED ORDER — LORATADINE 10 MG PO TABS
10.0000 mg | ORAL_TABLET | Freq: Every day | ORAL | Status: DC
  Administered 2019-06-26 – 2019-06-30 (×5): 10 mg via ORAL
  Filled 2019-06-26 (×5): qty 1

## 2019-06-26 MED ORDER — OXYCODONE-ACETAMINOPHEN 5-325 MG PO TABS
1.0000 | ORAL_TABLET | Freq: Four times a day (QID) | ORAL | Status: DC | PRN
  Administered 2019-06-26 (×2): 1 via ORAL
  Administered 2019-06-27: 2 via ORAL
  Administered 2019-06-27 – 2019-06-28 (×2): 1 via ORAL
  Filled 2019-06-26: qty 1
  Filled 2019-06-26 (×2): qty 2
  Filled 2019-06-26 (×2): qty 1

## 2019-06-26 MED ORDER — IBUPROFEN 200 MG PO TABS
600.0000 mg | ORAL_TABLET | Freq: Four times a day (QID) | ORAL | Status: DC | PRN
  Administered 2019-06-26 – 2019-06-28 (×3): 600 mg via ORAL
  Filled 2019-06-26 (×3): qty 3

## 2019-06-26 MED ORDER — KETOROLAC TROMETHAMINE 30 MG/ML IJ SOLN
30.0000 mg | Freq: Four times a day (QID) | INTRAMUSCULAR | Status: DC | PRN
  Administered 2019-06-26 – 2019-06-29 (×6): 30 mg via INTRAVENOUS
  Filled 2019-06-26 (×6): qty 1

## 2019-06-26 MED ORDER — VANCOMYCIN HCL 750 MG/150ML IV SOLN
750.0000 mg | Freq: Two times a day (BID) | INTRAVENOUS | Status: DC
  Administered 2019-06-26 – 2019-06-27 (×2): 750 mg via INTRAVENOUS
  Filled 2019-06-26 (×2): qty 150

## 2019-06-26 MED ORDER — ONDANSETRON HCL 4 MG/2ML IJ SOLN
4.0000 mg | Freq: Once | INTRAMUSCULAR | Status: AC
  Administered 2019-06-26: 4 mg via INTRAVENOUS
  Filled 2019-06-26: qty 2

## 2019-06-26 MED ORDER — ONDANSETRON HCL 4 MG/2ML IJ SOLN
4.0000 mg | Freq: Four times a day (QID) | INTRAMUSCULAR | Status: DC | PRN
  Administered 2019-06-26 – 2019-06-28 (×3): 4 mg via INTRAVENOUS
  Filled 2019-06-26 (×3): qty 2

## 2019-06-26 MED ORDER — SODIUM CHLORIDE 0.9 % IV SOLN: INTRAVENOUS | Status: DC

## 2019-06-26 MED ORDER — VANCOMYCIN HCL IN DEXTROSE 1-5 GM/200ML-% IV SOLN
1000.0000 mg | Freq: Once | INTRAVENOUS | Status: AC
  Administered 2019-06-26: 1000 mg via INTRAVENOUS
  Filled 2019-06-26: qty 200

## 2019-06-26 MED ORDER — ACETAMINOPHEN 500 MG PO TABS
1000.0000 mg | ORAL_TABLET | Freq: Once | ORAL | Status: AC
  Administered 2019-06-26: 1000 mg via ORAL
  Filled 2019-06-26: qty 2

## 2019-06-26 MED ORDER — BUSPIRONE HCL 5 MG PO TABS
5.0000 mg | ORAL_TABLET | Freq: Two times a day (BID) | ORAL | Status: DC
  Administered 2019-06-26 – 2019-07-01 (×10): 5 mg via ORAL
  Filled 2019-06-26 (×10): qty 1

## 2019-06-26 MED ORDER — METOCLOPRAMIDE HCL 5 MG/ML IJ SOLN
10.0000 mg | Freq: Once | INTRAMUSCULAR | Status: AC
  Administered 2019-06-26: 10 mg via INTRAVENOUS
  Filled 2019-06-26: qty 2

## 2019-06-26 MED ORDER — GABAPENTIN 100 MG PO CAPS
100.0000 mg | ORAL_CAPSULE | Freq: Every day | ORAL | Status: DC
  Administered 2019-06-26 – 2019-06-30 (×5): 100 mg via ORAL
  Filled 2019-06-26 (×5): qty 1

## 2019-06-26 MED ORDER — SODIUM CHLORIDE 0.9 % IV BOLUS (SEPSIS)
1000.0000 mL | Freq: Once | INTRAVENOUS | Status: AC
  Administered 2019-06-26: 1000 mL via INTRAVENOUS

## 2019-06-26 MED ORDER — KETOROLAC TROMETHAMINE 15 MG/ML IJ SOLN
15.0000 mg | Freq: Once | INTRAMUSCULAR | Status: AC
  Administered 2019-06-26: 15 mg via INTRAVENOUS
  Filled 2019-06-26: qty 1

## 2019-06-26 MED ORDER — IOHEXOL 300 MG/ML  SOLN
100.0000 mL | Freq: Once | INTRAMUSCULAR | Status: AC | PRN
  Administered 2019-06-26: 100 mL via INTRAVENOUS

## 2019-06-26 MED ORDER — SODIUM CHLORIDE 0.9 % IV BOLUS
1000.0000 mL | Freq: Once | INTRAVENOUS | Status: AC
  Administered 2019-06-26: 1000 mL via INTRAVENOUS

## 2019-06-26 MED ORDER — ALBUTEROL SULFATE (2.5 MG/3ML) 0.083% IN NEBU
2.5000 mg | INHALATION_SOLUTION | Freq: Four times a day (QID) | RESPIRATORY_TRACT | Status: DC | PRN
  Administered 2019-06-27 – 2019-06-29 (×3): 2.5 mg via RESPIRATORY_TRACT
  Filled 2019-06-26 (×3): qty 3

## 2019-06-26 MED ORDER — VANCOMYCIN HCL IN DEXTROSE 750-5 MG/150ML-% IV SOLN: 750.0000 mg | Freq: Two times a day (BID) | INTRAVENOUS | Status: DC

## 2019-06-26 MED ORDER — ACETAMINOPHEN 500 MG PO TABS
1000.0000 mg | ORAL_TABLET | Freq: Four times a day (QID) | ORAL | Status: DC | PRN
  Administered 2019-06-26 – 2019-06-28 (×5): 1000 mg via ORAL
  Filled 2019-06-26 (×5): qty 2

## 2019-06-26 MED ORDER — ONDANSETRON HCL 4 MG PO TABS
4.0000 mg | ORAL_TABLET | Freq: Four times a day (QID) | ORAL | Status: DC | PRN
  Administered 2019-06-29 (×2): 4 mg via ORAL
  Filled 2019-06-26 (×2): qty 1

## 2019-06-26 MED ORDER — IBUPROFEN 200 MG PO TABS: 600.0000 mg | ORAL_TABLET | Freq: Four times a day (QID) | ORAL | Status: DC | PRN

## 2019-06-26 MED ORDER — SODIUM CHLORIDE 0.9 % IV SOLN
2.0000 g | Freq: Three times a day (TID) | INTRAVENOUS | Status: DC
  Administered 2019-06-26 – 2019-06-27 (×4): 2 g via INTRAVENOUS
  Filled 2019-06-26 (×6): qty 2

## 2019-06-26 NOTE — ED Provider Notes (Signed)
[facility] DEPT Provider Note  CSN: [id number] Arrival date & time: 06/25/19 2204  Chief Complaint(s) Fever  HPI Megan Lara is a 39 y.o. female    Fever Max temp prior to arrival:  103.1 Temp source:  Oral Severity:  Moderate Onset quality:  Gradual Duration:  1 day Timing:  Constant Progression:  Waxing and waning Chronicity:  New Relieved by:  Nothing Worsened by:  Nothing Associated symptoms: chills, myalgias and nausea   Associated symptoms: no congestion, no cough, no diarrhea, no dysuria, no rhinorrhea and no vomiting   Risk factors comment:  Had lithotripsy on 4/22 for right sided 66mm stone   Past Medical History Past Medical History:  Diagnosis Date  . Adjustment disorder with mixed anxiety and depressed mood 12/17/2016  . Anemia   . Anemia affecting pregnancy, antepartum 12/17/2016  . Asthma   . Chorioamnionitis, delivered, current hospitalization 12/17/2016  . Depression   . Hx of cellulitis of skin with lymphangitis    from last CS  . Hypothyroidism 12/17/2016  . Thyroid dysfunction in pregnancy in third trimester    Patient Active Problem List   Diagnosis Date Noted  . Fever 06/26/2019  . 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 Hematoma of rectus sheath 05/28/2018  . Status post repeat low transverse cesarean section and BTL 05/25/2018  . Postpartum care following cesarean delivery (3/23) 05/25/2018  . Carpal tunnel syndrome, left upper limb 01/13/2017  . Carpal tunnel syndrome, right upper limb 01/13/2017  . Hypothyroidism 12/17/2016  . Adjustment disorder with mixed anxiety and depressed mood 12/17/2016   Home Medication(s) Prior to Admission medications   Medication Sig Start Date End Date Taking? Authorizing Provider  acetaminophen (TYLENOL) 500 MG tablet Take 1,000 mg by mouth every 6 (six) hours as needed for mild pain, moderate pain or headache.    Yes [provider]  ketorolac (TORADOL) 10 MG tablet  Take 10 mg by mouth every 6 (six) hours as needed for moderate pain.   Yes [provider]  levothyroxine (SYNTHROID, LEVOTHROID) 50 MCG tablet Take 50 mcg by mouth every other day.  10/05/15  Yes [provider]  ondansetron (ZOFRAN ODT) 8 MG disintegrating tablet Take 1 tablet (8 mg total) by mouth every 8 (eight) hours as needed for nausea or vomiting. 06/21/19  Yes Jaynee Eagles, PA-C  oxyCODONE-acetaminophen (PERCOCET/ROXICET) 5-325 MG tablet Take 2 tablets by mouth every 6 (six) hours as needed for severe pain. 06/18/19  Yes Ward, Kristen N, DO  albuterol (PROVENTIL HFA;VENTOLIN HFA) 108 (90 BASE) MCG/ACT inhaler Inhale 2 puffs into the lungs every 6 (six) hours as needed for wheezing.    [provider]  busPIRone (BUSPAR) 5 MG tablet Take 5 mg by mouth 2 (two) times daily.    Emergency, Nurse, RN  cetirizine (ZYRTEC) 10 MG tablet Take 10 mg by mouth at bedtime.     [provider]  gabapentin (NEURONTIN) 100 MG capsule Take 1 capsule (100 mg total) by mouth at bedtime as needed. Patient taking differently: Take 100 mg by mouth at bedtime.  04/15/19   Star Age, MD  ibuprofen (ADVIL) 800 MG tablet Take 1 tablet (800 mg total) by mouth every 8 (eight) hours as needed for mild pain. Patient not taking: Reported on 06/26/2019 06/18/19   Ward, Delice Bison, DO  levothyroxine (SYNTHROID, LEVOTHROID) 75 MCG tablet Take 75 mcg by mouth every other day.     [provider]  sertraline (ZOLOFT) 100 MG tablet Take 100  mg by mouth at bedtime.    [provider]  tamsulosin (FLOMAX) 0.4 MG CAPS capsule Take 1 capsule (0.4 mg total) by mouth daily. Take until kidney stone passes. 06/18/19   Ward, Layla Maw, DO                                                                                                                                    Past Surgical History Past Surgical History:  Procedure Laterality Date  . CESAREAN SECTION N/A 12/16/2016   Procedure:  CESAREAN SECTION;  Surgeon: Shea Evans, MD;  Location: [facility] BIRTHING SUITES;  Service: Obstetrics;  Laterality: N/A;  . CESAREAN SECTION WITH BILATERAL TUBAL LIGATION Bilateral 05/25/2018   Procedure: Repeat CESAREAN SECTION WITH BILATERAL TUBAL LIGATION;  Surgeon: Shea Evans, MD;  Location: [facility] LD ORS;  Service: Obstetrics;  Laterality: Bilateral;  EDD: 05/22/18  . EXTRACORPOREAL SHOCK WAVE LITHOTRIPSY Right 06/24/2019   Procedure: EXTRACORPOREAL SHOCK WAVE LITHOTRIPSY (ESWL);  Surgeon: Ihor Gully, MD;  Location: [facility];  Service: Urology;  Laterality: Right;  75 MINS  . GANGLION CYST EXCISION Left 4/15   3 rd toe  . HERNIA REPAIR Right 1991  . IR ANGIOGRAM PELVIS SELECTIVE OR SUPRASELECTIVE  05/28/2018  . IR ANGIOGRAM SELECTIVE EACH ADDITIONAL VESSEL  05/28/2018  . IR ANGIOGRAM SELECTIVE EACH ADDITIONAL VESSEL  05/28/2018  . IR ANGIOGRAM SELECTIVE EACH ADDITIONAL VESSEL  05/28/2018  . IR EMBO ART  VEN HEMORR LYMPH EXTRAV  INC GUIDE ROADMAPPING  05/28/2018  . IR US GUIDE VASC ACCESS RIGHT  05/28/2018  . TONSILLECTOMY  1997   Family History Family History  Adopted: Yes  Problem Relation Age of Onset  . Uterine cancer Maternal Grandmother     Social History Social History   Tobacco Use  . Smoking status: Former Smoker    Packs/day: 0.50    Years: 7.00    Pack years: 3.50    Types: Cigarettes    Quit date: 03/04/2013    Years since quitting: 6.3  . Smokeless tobacco: Never Used  . Tobacco comment: does some vape  Substance Use Topics  . Alcohol use: Not Currently    Alcohol/week: 0.0 standard drinks    Comment: 9 a week  . Drug use: No   Allergies Patient has no known allergies.  Review of Systems Review of Systems  Constitutional: Positive for chills and fever.  HENT: Negative for congestion and rhinorrhea.   Respiratory: Negative for cough.   Gastrointestinal: Positive for nausea. Negative for diarrhea and vomiting.  Genitourinary: Negative for  dysuria.  Musculoskeletal: Positive for myalgias.   All other systems are reviewed and are negative for acute change except as noted in the HPI  Physical Exam Vital Signs  I have reviewed the triage vital signs BP 104/84 (BP Location: Right Arm)   Pulse (!) 124   Temp (!) 102.8 F (39.3 C) (Oral)   Resp 16  Ht 5\' 6"  (1.676 m)   Wt 68 kg   LMP 06/04/2019   SpO2 94%   BMI 24.21 kg/m   Physical Exam Vitals reviewed.  Constitutional:      General: She is not in acute distress.    Appearance: She is well-developed. She is not diaphoretic.  HENT:     Head: Normocephalic and atraumatic.     Right Ear: External ear normal.     Left Ear: External ear normal.     Nose: Nose normal.  Eyes:     General: No scleral icterus.       Right eye: No discharge.        Left eye: No discharge.     Conjunctiva/sclera: Conjunctivae normal.     Pupils: Pupils are equal, round, and reactive to light.  Neck:     Trachea: Phonation normal.  Cardiovascular:     Rate and Rhythm: Normal rate and regular rhythm.     Heart sounds: No murmur. No friction rub. No gallop.   Pulmonary:     Effort: Pulmonary effort is normal. No respiratory distress.     Breath sounds: Normal breath sounds. No stridor. No rales.  Abdominal:     General: There is no distension.     Palpations: Abdomen is soft.     Tenderness: There is no abdominal tenderness. There is right CVA tenderness.  Musculoskeletal:        General: No tenderness. Normal range of motion.     Cervical back: Normal range of motion and neck supple.  Skin:    General: Skin is warm and dry.     Findings: No erythema or rash.  Neurological:     Mental Status: She is alert and oriented to person, place, and time.  Psychiatric:        Behavior: Behavior normal.     ED Results and Treatments Labs (all labs ordered are listed, but only abnormal results are displayed) Labs Reviewed  COMPREHENSIVE METABOLIC PANEL - Abnormal; Notable for the  following components:      Result Value   Sodium 132 (*)    Glucose, Bld 135 (*)    Calcium 8.3 (*)    All other components within normal limits  CBC WITH DIFFERENTIAL/PLATELET - Abnormal; Notable for the following components:   WBC 11.2 (*)    RBC 3.79 (*)    Hemoglobin 11.7 (*)    Neutro Abs 9.0 (*)    Monocytes Absolute 1.1 (*)    All other components within normal limits  URINALYSIS, ROUTINE W REFLEX MICROSCOPIC - Abnormal; Notable for the following components:   Hgb urine dipstick MODERATE (*)    Protein, ur 30 (*)    RBC / HPF >50 (*)    Bacteria, UA RARE (*)    All other components within normal limits  I-STAT BETA HCG BLOOD, ED ([facility], [facility], [facility] ONLY) - Abnormal; Notable for the following components:   I-stat hCG, quantitative 6.3 (*)    All other components within normal limits  I-STAT CHEM 8, ED - Abnormal; Notable for the following components:   Sodium 131 (*)    BUN 5 (*)    Glucose, Bld 134 (*)    Calcium, Ion 1.12 (*)    Hemoglobin 11.9 (*)    HCT 35.0 (*)    All other components within normal limits  RESPIRATORY PANEL BY RT PCR (FLU A&B, COVID)  CULTURE, BLOOD (ROUTINE X 2)  CULTURE, BLOOD (ROUTINE X 2)  URINE CULTURE  LACTIC ACID, PLASMA  PROTIME-INR  LACTIC ACID, PLASMA                                                                                                                         EKG  EKG Interpretation  Date/Time:  Saturday June 26 2019 02:20:01 EDT Ventricular Rate:  118 PR Interval:    QRS Duration: 83 QT Interval:  296 QTC Calculation: 415 R Axis:   41 Text Interpretation: Sinus tachycardia Baseline wander in lead(s) V5 No acute changes Confirmed by Drema Pry [phone]) on 06/26/2019 3:28:28 AM      Radiology DG Chest 2 View  Result Date: 06/25/2019 CLINICAL DATA:  39 year old female with suspected sepsis. EXAM: CHEST - 2 VIEW COMPARISON:  Chest radiograph dated 03/23/2019. FINDINGS: The heart size and mediastinal contours are within  normal limits. Both lungs are clear. The visualized skeletal structures are unremarkable. IMPRESSION: No active cardiopulmonary disease. Electronically Signed   By: Elgie Collard M.D.   On: 06/25/2019 22:48   CT ABDOMEN PELVIS W CONTRAST  Result Date: 06/26/2019 CLINICAL DATA:  39 year old female with history of lithotripsy on Thursday. Postprocedural pain and fever. EXAM: CT ABDOMEN AND PELVIS WITH CONTRAST TECHNIQUE: Multidetector CT imaging of the abdomen and pelvis was performed using the standard protocol following bolus administration of intravenous contrast. CONTRAST:  OMNIPAQUE IOHEXOL 300 MG/ML  SOLN COMPARISON:  CT the abdomen and pelvis 06/18/2019. FINDINGS: Lower chest: Trace volume of right pleural fluid and minimal subsegmental atelectasis in the base of the right lower lobe. Hepatobiliary: 1.3 x 0.8 cm low-attenuation area in segment 6 of the liver (axial image 46 of series 6). No other suspicious hepatic lesions. No intra or extrahepatic biliary ductal dilatation. Gallbladder is normal in appearance. Pancreas: No pancreatic mass. No pancreatic ductal dilatation. No pancreatic or peripancreatic fluid collections or inflammatory changes. Spleen: Unremarkable. Adrenals/Urinary Tract: Left kidney and bilateral adrenal glands are normal in appearance. Previously noted calculus in the proximal third of the right ureter is not identified. No calculus fragments are identified along the course of the right ureter or within the lumen of the urinary bladder. Mild hypoenhancement of the right kidney when compared to the contralateral side, which may reflect some resolving parenchymal edema in the setting of recent lithotripsy. Trace amount of perinephric fluid and soft tissue stranding, presumably related to recent lithotripsy. No well-defined perinephric fluid collection. Specifically, no evidence of subcapsular hematoma. Urinary bladder is normal in appearance. Stomach/Bowel: Normal appearance of  the stomach. No pathologic dilatation of small bowel or colon. Normal appendix. Vascular/Lymphatic: No significant atherosclerotic disease, aneurysm or dissection noted in the abdominal or pelvic vasculature. No lymphadenopathy noted in the abdomen or pelvis. Reproductive: Uterus and ovaries are unremarkable in appearance. Other: No significant volume of ascites.  No pneumoperitoneum. Musculoskeletal: There are no aggressive appearing lytic or blastic lesions noted in the visualized portions of the skeleton. IMPRESSION: 1. Postprocedural changes of recent lithotripsy with trace amount of right  perinephric fluid and soft tissue stranding, mild edema in the right kidney, and what appears to be a very small contusion of the inferior aspect of segment 6 of the liver adjacent to the inferior pole of the right kidney. No subcapsular hematoma or other significant acute complicating features. Previously noted right proximal ureteral calculus is no longer identified. No calculus fragments along the course of the right kidney or within the lumen of the urinary bladder. 2. Trace right pleural effusion with minimal subsegmental atelectasis in the dependent right lower lobe. Electronically Signed   By: Trudie Reed M.D.   On: 06/26/2019 05:52    Pertinent labs & imaging results that were available during my care of the patient were reviewed by me and considered in my medical decision making (see chart for details).  Medications Ordered in ED Medications  sodium chloride 0.9 % bolus 1,000 mL (0 mLs Intravenous Stopped 06/26/19 0333)    Followed by  0.9 %  sodium chloride infusion (1,000 mLs Intravenous New Bag/Given 06/26/19 0338)  sodium chloride (PF) 0.9 % injection (has no administration in time range)  fentaNYL (SUBLIMAZE) injection 50 mcg (50 mcg Intravenous Given 06/26/19 0050)  ondansetron (ZOFRAN) injection 4 mg (4 mg Intravenous Given 06/26/19 0048)  cefTRIAXone (ROCEPHIN) 1 g in sodium chloride 0.9 % 100 mL  IVPB (0 g Intravenous Stopped 06/26/19 0153)  ketorolac (TORADOL) 15 MG/ML injection 15 mg (15 mg Intravenous Given 06/26/19 0216)  acetaminophen (TYLENOL) tablet 1,000 mg (1,000 mg Oral Given 06/26/19 0337)  iohexol (OMNIPAQUE) 300 MG/ML solution 100 mL (100 mLs Intravenous Contrast Given 06/26/19 0426)  metoCLOPramide (REGLAN) injection 10 mg (10 mg Intravenous Given 06/26/19 0730)  sodium chloride 0.9 % bolus 1,000 mL (1,000 mLs Intravenous New Bag/Given 06/26/19 0729)                                                                                                                                    Procedures .Critical Care Performed by: Nira Conn, MD Authorized by: Nira Conn, MD     CRITICAL CARE Performed by: Amadeo Garnet  Total critical care time: 60 minutes Critical care time was exclusive of separately billable procedures and treating other patients. Critical care was necessary to treat or prevent imminent or life-threatening deterioration. Critical care was time spent personally by me on the following activities: development of treatment plan with patient and/or surrogate as well as nursing, discussions with consultants, evaluation of patient's response to treatment, examination of patient, obtaining history from patient or surrogate, ordering and performing treatments and interventions, ordering and review of laboratory studies, ordering and review of radiographic studies, pulse oximetry and re-evaluation of patient's condition.   (including critical care time)  Medical Decision Making / ED Course I have reviewed the nursing notes for this encounter and the patient's prior records (if available in EHR or on provided paperwork).   [name] was evaluated in  Emergency Department on 06/26/2019 for the symptoms described in the history of present illness. She was evaluated in the context of the global COVID-19 pandemic, which necessitated  consideration that the patient might be at risk for infection with the SARS-CoV-2 virus that causes COVID-19. Institutional protocols and algorithms that pertain to the evaluation of patients at risk for COVID-19 are in a state of rapid change based on information released by regulatory bodies including the CDC and federal and state organizations. These policies and algorithms were followed during the patient's care in the ED.    Clinical Course as of Jun 26 730  Sat Jun 26, 2019  0027 Patient is febrile and tachycardic with right flank pain following lithotripsy 2 days ago.  No other infectious symptoms pointing to source other than urinary. Will get sepsis screening labs, provide patient with IVF, pain meds and antiemetics.   Will also start empiric Rocephin.   [PC]    Clinical Course User Index [PC] , Amadeo Garnet Eduardo, MD   Labs as above notable for leukocytosis.  No significant electrolyte derangements or renal sufficiency.  UA with hematuria but no convincing for urinary tract infection.  Covid and flu negative. CXR negative. I obtained a CT scan to assess for any intra-abdominal postprocedural infection which was negative.  On reassessment, patient is still tachycardic and nauseated.  Will call hospitalist team for admission to continue the work-up and management.  Vancomycin added.  Final Clinical Impression(s) / ED Diagnoses Final diagnoses:  Post-procedural fever  Tachycardia      This chart was dictated using voice recognition software.  Despite best efforts to proofread,  errors can occur which can change the documentation meaning.   Nira Connardama,  Eduardo, MD 06/26/19 [phone]

## 2019-06-26 NOTE — Progress Notes (Signed)
A consult was received from an ED physician for Vancomycin per pharmacy dosing.  The patient's profile has been reviewed for ht/wt/allergies/indication/available labs.   A one time order has been placed for Vancomycin 1gm IV.  Further antibiotics/pharmacy consults should be ordered by admitting physician if indicated.                       Thank you, Junita Push 06/26/2019  7:47 AM

## 2019-06-26 NOTE — H&P (Signed)
History and Physical    Megan Lara CNO:[id number] DOB: [birth year] DOA: 06/25/2019  PCP: Mila Palmer, MD   Patient coming from: Home  I have personally briefly reviewed patient's old medical records in [facility]  Chief Complaint: Fever  HPI: Megan Lara is a 39 y.o. female with medical history significant of Asthma,  Hypothyroidism, depression, right ureteral stone status post extracorporeal shockwave lithotripsy on 06/24/2019 by Dr. Ottelin/urology presented with fever since yesterday.  Patient is having high-grade fever with chills since yesterday but denies worsening cough, shortness of breath, chest pain, vomiting, abdominal pain, diarrhea.  She denies any increased frequency of urination or dysuria but complains of some flank pain along with nausea.  She has very poor appetite.  Denies loss of consciousness, seizures, rash.  Has not had her Covid vaccine yet.  ED Course: Patient was found to be febrile, tachycardic with mild leukocytosis.  She was started on IV fluids and antibiotics.  CT of the abdomen and pelvis with contrast showed postprocedural changes of recent lithotripsy with trace amount of right perinephric fluid and soft tissue stranding but no obstructing stone.  Chest x-ray was negative for infiltrates.  Review of Systems: As per HPI otherwise all other systems were reviewed and are negative.   Past Medical History:  Diagnosis Date  . Adjustment disorder with mixed anxiety and depressed mood 12/17/2016  . Anemia   . Anemia affecting pregnancy, antepartum 12/17/2016  . Asthma   . Chorioamnionitis, delivered, current hospitalization 12/17/2016  . Depression   . Hx of cellulitis of skin with lymphangitis    from last CS  . Hypothyroidism 12/17/2016  . Thyroid dysfunction in pregnancy in third trimester     Past Surgical History:  Procedure Laterality Date  . CESAREAN SECTION N/A 12/16/2016   Procedure: CESAREAN SECTION;  Surgeon:  Shea Evans, MD;  Location: [facility] BIRTHING SUITES;  Service: Obstetrics;  Laterality: N/A;  . CESAREAN SECTION WITH BILATERAL TUBAL LIGATION Bilateral 05/25/2018   Procedure: Repeat CESAREAN SECTION WITH BILATERAL TUBAL LIGATION;  Surgeon: Shea Evans, MD;  Location: [facility] LD ORS;  Service: Obstetrics;  Laterality: Bilateral;  EDD: 05/22/18  . EXTRACORPOREAL SHOCK WAVE LITHOTRIPSY Right 06/24/2019   Procedure: EXTRACORPOREAL SHOCK WAVE LITHOTRIPSY (ESWL);  Surgeon: Ihor Gully, MD;  Location: [facility];  Service: Urology;  Laterality: Right;  75 MINS  . GANGLION CYST EXCISION Left 4/15   3 rd toe  . HERNIA REPAIR Right 1991  . IR ANGIOGRAM PELVIS SELECTIVE OR SUPRASELECTIVE  05/28/2018  . IR ANGIOGRAM SELECTIVE EACH ADDITIONAL VESSEL  05/28/2018  . IR ANGIOGRAM SELECTIVE EACH ADDITIONAL VESSEL  05/28/2018  . IR ANGIOGRAM SELECTIVE EACH ADDITIONAL VESSEL  05/28/2018  . IR EMBO ART  VEN HEMORR LYMPH EXTRAV  INC GUIDE ROADMAPPING  05/28/2018  . IR US GUIDE VASC ACCESS RIGHT  05/28/2018  . TONSILLECTOMY  1997   Social history  reports that she quit smoking about 6 years ago. Her smoking use included cigarettes. She has a 3.50 pack-year smoking history. She has never used smokeless tobacco. She reports previous alcohol use. She reports that she does not use drugs.  No Known Allergies  Family History  Adopted: Yes  Problem Relation Age of Onset  . Uterine cancer Maternal Grandmother     Prior to Admission medications   Medication Sig Start Date End Date Taking? Authorizing Provider  acetaminophen (TYLENOL) 500 MG tablet Take 1,000 mg by mouth every 6 (six) hours as needed for mild pain,  moderate pain or headache.    Yes [provider]  ketorolac (TORADOL) 10 MG tablet Take 10 mg by mouth every 6 (six) hours as needed for moderate pain.   Yes [provider]  levothyroxine (SYNTHROID, LEVOTHROID) 50 MCG tablet Take 50 mcg by mouth every other day.  10/05/15  Yes  [provider]  ondansetron (ZOFRAN ODT) 8 MG disintegrating tablet Take 1 tablet (8 mg total) by mouth every 8 (eight) hours as needed for nausea or vomiting. 06/21/19  Yes Wallis Bamberg, PA-C  oxyCODONE-acetaminophen (PERCOCET/ROXICET) 5-325 MG tablet Take 2 tablets by mouth every 6 (six) hours as needed for severe pain. 06/18/19  Yes Ward, Kristen N, DO  albuterol (PROVENTIL HFA;VENTOLIN HFA) 108 (90 BASE) MCG/ACT inhaler Inhale 2 puffs into the lungs every 6 (six) hours as needed for wheezing.    [provider]  busPIRone (BUSPAR) 5 MG tablet Take 5 mg by mouth 2 (two) times daily.    Emergency, Nurse, RN  cetirizine (ZYRTEC) 10 MG tablet Take 10 mg by mouth at bedtime.     [provider]  gabapentin (NEURONTIN) 100 MG capsule Take 1 capsule (100 mg total) by mouth at bedtime as needed. Patient taking differently: Take 100 mg by mouth at bedtime.  04/15/19   Huston Foley, MD  ibuprofen (ADVIL) 800 MG tablet Take 1 tablet (800 mg total) by mouth every 8 (eight) hours as needed for mild pain. Patient not taking: Reported on 06/26/2019 06/18/19   Ward, Layla Maw, DO  levothyroxine (SYNTHROID, LEVOTHROID) 75 MCG tablet Take 75 mcg by mouth every other day.     [provider]  sertraline (ZOLOFT) 100 MG tablet Take 100 mg by mouth at bedtime.    [provider]  tamsulosin (FLOMAX) 0.4 MG CAPS capsule Take 1 capsule (0.4 mg total) by mouth daily. Take until kidney stone passes. 06/18/19   Ward, Layla Maw, DO    Physical Exam: Vitals:   06/26/19 0731 06/26/19 0837 06/26/19 0900 06/26/19 0930  BP: 108/87 102/66 113/75 131/75  Pulse: (!) 124 (!) 116 (!) 116 (!) 117  Resp: 15 (!) 25 18 (!) 25  Temp: (!) 102.3 F (39.1 C)     TempSrc: Oral     SpO2: 95% 95% 98% 100%  Weight:      Height:        Constitutional: NAD, calm, comfortable Vitals:   06/26/19 0731 06/26/19 0837 06/26/19 0900 06/26/19 0930  BP: 108/87 102/66 113/75 131/75  Pulse: (!) 124 (!)  116 (!) 116 (!) 117  Resp: 15 (!) 25 18 (!) 25  Temp: (!) 102.3 F (39.1 C)     TempSrc: Oral     SpO2: 95% 95% 98% 100%  Weight:      Height:       Eyes: PERRL, lids and conjunctivae normal ENMT: Mucous membranes are dry. Posterior pharynx clear of any exudate or lesions. Neck: normal, supple, no masses, no thyromegaly Respiratory: bilateral decreased breath sounds at bases, no wheezing, no crackles.  Intermittently tachypneic.  No accessory muscle use.  Cardiovascular: S1 S2 positive, tachycardic. No extremity edema. 2+ pedal pulses.  Abdomen: no tenderness, no masses palpated. No hepatosplenomegaly. Bowel sounds positive.  Genitourinary: Mild right flank tenderness present Musculoskeletal: no clubbing / cyanosis. No joint deformity upper and lower extremities.  Skin: no rashes, lesions, ulcers. No induration Neurologic: CN 2-12 grossly intact. Moving extremities. No focal neurologic deficits.  Psychiatric: Normal judgment and insight. Alert and oriented x  3. Normal mood.    Labs on Admission: I have personally reviewed following labs and imaging studies  CBC: Recent Labs  Lab 06/25/19 2231 06/26/19 0113  WBC 11.2*  --   NEUTROABS 9.0*  --   HGB 11.7* 11.9*  HCT 36.5 35.0*  MCV 96.3  --   PLT 309  --    Basic Metabolic Panel: Recent Labs  Lab 06/25/19 2231 06/26/19 0113  NA 132* 131*  K 4.0 3.9  CL 101 100  CO2 23  --   GLUCOSE 135* 134*  BUN 6 5*  CREATININE 0.77 0.90  CALCIUM 8.3*  --    GFR: Estimated Creatinine Clearance: 80.9 mL/min (by C-G formula based on SCr of 0.9 mg/dL). Liver Function Tests: Recent Labs  Lab 06/25/19 2231  AST 24  ALT 24  ALKPHOS 56  BILITOT 0.8  PROT 7.3  ALBUMIN 3.7   No results for input(s): LIPASE, AMYLASE in the last 168 hours. No results for input(s): AMMONIA in the last 168 hours. Coagulation Profile: Recent Labs  Lab 06/25/19 2231  INR 1.0   Cardiac Enzymes: No results for input(s): CKTOTAL, CKMB, CKMBINDEX,  TROPONINI in the last 168 hours. BNP (last 3 results) No results for input(s): PROBNP in the last 8760 hours. HbA1C: No results for input(s): HGBA1C in the last 72 hours. CBG: No results for input(s): GLUCAP in the last 168 hours. Lipid Profile: No results for input(s): CHOL, HDL, LDLCALC, TRIG, CHOLHDL, LDLDIRECT in the last 72 hours. Thyroid Function Tests: No results for input(s): TSH, T4TOTAL, FREET4, T3FREE, THYROIDAB in the last 72 hours. Anemia Panel: No results for input(s): VITAMINB12, FOLATE, FERRITIN, TIBC, IRON, RETICCTPCT in the last 72 hours. Urine analysis:    Component Value Date/Time   COLORURINE YELLOW 06/26/2019 0055   APPEARANCEUR CLEAR 06/26/2019 0055   LABSPEC 1.009 06/26/2019 0055   PHURINE 6.0 06/26/2019 0055   GLUCOSEU NEGATIVE 06/26/2019 0055   HGBUR MODERATE (A) 06/26/2019 0055   BILIRUBINUR NEGATIVE 06/26/2019 0055   BILIRUBINUR negative 06/14/2019 1424   BILIRUBINUR N 04/12/2014 1437   KETONESUR NEGATIVE 06/26/2019 0055   PROTEINUR 30 (A) 06/26/2019 0055   UROBILINOGEN 0.2 06/14/2019 1424   UROBILINOGEN 0.2 09/12/2012 1714   NITRITE NEGATIVE 06/26/2019 0055   LEUKOCYTESUR NEGATIVE 06/26/2019 0055    Radiological Exams on Admission: DG Chest 2 View  Result Date: 06/25/2019 CLINICAL DATA:  39 year old female with suspected sepsis. EXAM: CHEST - 2 VIEW COMPARISON:  Chest radiograph dated 03/23/2019. FINDINGS: The heart size and mediastinal contours are within normal limits. Both lungs are clear. The visualized skeletal structures are unremarkable. IMPRESSION: No active cardiopulmonary disease. Electronically Signed   By: Elgie CollardArash  Radparvar M.D.   On: 06/25/2019 22:48   DG Abd 1 View - KUB  Result Date: 06/24/2019 CLINICAL DATA:  Right kidney stone. EXAM: ABDOMEN - 1 VIEW COMPARISON:  June 18, 2019. June 22, 2019. FINDINGS: The bowel gas pattern is normal. Rounded calculus is seen to the right of the L3-4 disc space which corresponds to ureteral  calculus noted on prior CT. No definite nephrolithiasis is noted. IMPRESSION: Right ureteral calculus is noted. No evidence of bowel obstruction or ileus. Electronically Signed   By: Lupita RaiderJames  Green Jr M.D.   On: 06/24/2019 10:15   CT ABDOMEN PELVIS W CONTRAST  Result Date: 06/26/2019 CLINICAL DATA:  39 year old female with history of lithotripsy on Thursday. Postprocedural pain and fever. EXAM: CT ABDOMEN AND PELVIS WITH CONTRAST TECHNIQUE: Multidetector CT imaging of the abdomen and  pelvis was performed using the standard protocol following bolus administration of intravenous contrast. CONTRAST:  150mL OMNIPAQUE IOHEXOL 300 MG/ML  SOLN COMPARISON:  CT the abdomen and pelvis 06/18/2019. FINDINGS: Lower chest: Trace volume of right pleural fluid and minimal subsegmental atelectasis in the base of the right lower lobe. Hepatobiliary: 1.3 x 0.8 cm low-attenuation area in segment 6 of the liver (axial image 46 of series 6). No other suspicious hepatic lesions. No intra or extrahepatic biliary ductal dilatation. Gallbladder is normal in appearance. Pancreas: No pancreatic mass. No pancreatic ductal dilatation. No pancreatic or peripancreatic fluid collections or inflammatory changes. Spleen: Unremarkable. Adrenals/Urinary Tract: Left kidney and bilateral adrenal glands are normal in appearance. Previously noted calculus in the proximal third of the right ureter is not identified. No calculus fragments are identified along the course of the right ureter or within the lumen of the urinary bladder. Mild hypoenhancement of the right kidney when compared to the contralateral side, which may reflect some resolving parenchymal edema in the setting of recent lithotripsy. Trace amount of perinephric fluid and soft tissue stranding, presumably related to recent lithotripsy. No well-defined perinephric fluid collection. Specifically, no evidence of subcapsular hematoma. Urinary bladder is normal in appearance. Stomach/Bowel:  Normal appearance of the stomach. No pathologic dilatation of small bowel or colon. Normal appendix. Vascular/Lymphatic: No significant atherosclerotic disease, aneurysm or dissection noted in the abdominal or pelvic vasculature. No lymphadenopathy noted in the abdomen or pelvis. Reproductive: Uterus and ovaries are unremarkable in appearance. Other: No significant volume of ascites.  No pneumoperitoneum. Musculoskeletal: There are no aggressive appearing lytic or blastic lesions noted in the visualized portions of the skeleton. IMPRESSION: 1. Postprocedural changes of recent lithotripsy with trace amount of right perinephric fluid and soft tissue stranding, mild edema in the right kidney, and what appears to be a very small contusion of the inferior aspect of segment 6 of the liver adjacent to the inferior pole of the right kidney. No subcapsular hematoma or other significant acute complicating features. Previously noted right proximal ureteral calculus is no longer identified. No calculus fragments along the course of the right kidney or within the lumen of the urinary bladder. 2. Trace right pleural effusion with minimal subsegmental atelectasis in the dependent right lower lobe. Electronically Signed   By: Vinnie Langton M.D.   On: 06/26/2019 05:52    EKG: Independently reviewed.  Sinus tachycardia.  No ST elevations or depressions.  Assessment/Plan  Sepsis: Present on admission, most likely from complicated UTI Probable complicated UTI in a patient with right ureteral stone status post recent extracorporeal shockwave lithotripsy on 06/24/2019 Leukocytosis -Patient had ESWL on 06/24/2019 by Dr. Ottelin/urology and presented with fever since yesterday with tachycardia and mild leukocytosis -CT of the abdomen and pelvis showed postprocedural changes of recent lithotripsy with trace amount of right perinephric fluid and soft tissue stranding, mild edema in the right kidney but no obstructing stone. -We  will continue broad-spectrum antibiotics for now including cefepime and vancomycin.  Follow cultures. -IV fluids.  Pain management.  Antiemetics as needed -Spoke to Dr. Winter/urology who recommended conservative medical management and no need for current surgical intervention.  Outpatient follow-up with urology  Hypothyroidism -Continue levothyroxine  Asthma -Currently controlled.  Albuterol as needed  Depression  -Continue buspirone   DVT prophylaxis: SCDs.  Early ambulation Code Status: Full Family Communication: Husband at bedside Disposition Plan: Home in 2 to 3 days once clinically improves Consults called: Spoke to Dr. Markham Jordan on phone Admission status: Inpatient/telemetry  Severity of Illness: The appropriate patient status for this patient is INPATIENT. Inpatient status is judged to be reasonable and necessary in order to provide the required intensity of service to ensure the patient's safety. The patient's presenting symptoms, physical exam findings, and initial radiographic and laboratory data in the context of their chronic comorbidities is felt to place them at high risk for further clinical deterioration. Furthermore, it is not anticipated that the patient will be medically stable for discharge from the hospital within 2 midnights of admission. The following factors support the patient status of inpatient.   " The patient's presenting symptoms include fever. " The worrisome physical exam findings include fever/tachycardia " The initial radiographic and laboratory data are worrisome because of leukocytosis/CT abdomen and pelvis showing postprocedural changes from recent lithotripsy. " The chronic co-morbidities include hypothyroidism/asthma.   * I certify that at the point of admission it is my clinical judgment that the patient will require inpatient hospital care spanning beyond 2 midnights from the point of admission due to high intensity of service, high risk  for further deterioration and high frequency of surveillance required.Glade Lloyd MD [facility]  06/26/2019, 9:33 AM

## 2019-06-26 NOTE — Progress Notes (Signed)
Pharmacy Antibiotic Note  Megan Lara is a 38 y.o. female admitted on 06/25/2019 with urosepsis.  Pharmacy has been consulted for vancomycin and cefepime dosing.  Plan:  Vancomycin 1000 mg IV now, then 750 mg IV q12 hr (est AUC 426 based on SCr 0.9; Vd 0.72)  Measure vancomycin AUC at steady state as indicated  SCr q48 while on vanc  Cefepime 2 g IV q8 hr  Consider stopping vancomycin if no clear risk factors for MRSA (pt did have staph aureus in recent urine Cx, but this was pan-sensitive)   Height: 5\' 6"  (167.6 cm) Weight: 68 kg (150 lb) IBW/kg (Calculated) : 59.3  Temp (24hrs), Avg:102.7 F (39.3 C), Min:102.3 F (39.1 C), Max:103.1 F (39.5 C)  Recent Labs  Lab 06/25/19 2231 06/26/19 0031 06/26/19 0113  WBC 11.2*  --   --   CREATININE 0.77  --  0.90  LATICACIDVEN  --  0.8  --     Estimated Creatinine Clearance: 80.9 mL/min (by C-G formula based on SCr of 0.9 mg/dL).    No Known Allergies  Antimicrobials this admission: 4/24 vanc >>  4/24 cefepime >>   Dose adjustments this admission: n/a  Microbiology results: 4/23 BCx: sent 4/24 UCx: sent   Thank you for allowing pharmacy to be a part of this patient's care.  ,  A 06/26/2019 8:54 AM

## 2019-06-26 NOTE — Progress Notes (Signed)
   06/26/19 1432  Assess: MEWS Score  Temp (!) 101.1 F (38.4 C)  BP (!) 133/106  Pulse Rate (!) 144  Resp (!) 30  SpO2 100 %  O2 Device Room Air  Assess: MEWS Score  MEWS Temp 1  MEWS Systolic 0  MEWS Pulse 3  MEWS RR 2  MEWS LOC 0  MEWS Score 6  MEWS Score Color Red  Assess: if the MEWS score is Yellow or Red  Were vital signs taken at a resting state? Yes  Focused Assessment Documented focused assessment  Early Detection of Sepsis Score *See Row Information* High  MEWS guidelines implemented *See Row Information* Yes  Treat  MEWS Interventions Administered prn meds/treatments  Take Vital Signs  Increase Vital Sign Frequency  Red: Q 1hr X 4 then Q 4hr X 4, if remains red, continue Q 4hrs  Escalate  MEWS: Escalate Red: discuss with charge nurse/RN and provider, consider discussing with RRT  Notify: Charge Nurse/RN  Name of Charge Nurse/RN Notified Toniann Fail  Date Charge Nurse/RN Notified 06/26/19  Time Charge Nurse/RN Notified 1432  Notify: Provider  Provider Name/Title Hanley Ben  Date Provider Notified 06/26/19  Time Provider Notified 1432  Notification Type Page  Notification Reason Change in status  Response See new orders;Other (Comment) (given PRN tyelnol andneworders for ibuprofren)  Date of Provider Response 06/26/19  Time of Provider Response 1433  Notify: Rapid Response  Name of Rapid Response RN Notified Sarah RN  Date Rapid Response Notified 06/26/19  Time Rapid Response Notified 1432  Document  Patient Outcome Not stable and remains on department  Progress note created (see row info) Yes   RN called to the room by patient for another blanket. Patient found to be shivering. Vital signs obtained and patient was found to be in Red mews. Rapid response nurse called. Advised to give the PRN tylenol and notify MD. Dr. Hanley Ben notified. Received PRN orders for Ibuprofen if tylenol is not effective. Patient is still alert and oriented and able to answers questions. RN  will continue to monitor.

## 2019-06-26 NOTE — ED Notes (Signed)
ED TO INPATIENT HANDOFF REPORT  ED Nurse Name and Phone #: Estill Bamberg, RN [phone]  S Name/Age/Gender Megan Lara 39 y.o. female Room/Bed: WA20/WA20  Code Status   Code Status: Full Code  Home/SNF/Other Home Patient oriented to: self, place, time and situation Is this baseline? Yes   Triage Complete: Triage complete  Chief Complaint Fever [R50.9]  Triage Note Pt c/o of right side flank pain . Pt had lipotrophy on Thursday. Pain and fever has not resolved after taking a percocet tonight. PCP recommended the ER     Allergies No Known Allergies  Level of Care/Admitting Diagnosis ED Disposition    ED Disposition Condition Comment   Admit  Hospital Area: [facility] [[id number]]  Level of Care: Telemetry [5]  Admit to tele based on following criteria: Other see comments  Comments: tachycardia  May [facility] patient to [facility] if equivalent level of care is available:: Yes  Covid Evaluation: Confirmed COVID Negative  Diagnosis: Fever [344092]  Admitting Physician: Aline August [[id number]]  Attending Physician: Aline August [[id number]]  Estimated length of stay: 3 - 4 days  Certification:: I certify this patient will need inpatient services for at least 2 midnights       B Medical/Surgery History Past Medical History:  Diagnosis Date  . Adjustment disorder with mixed anxiety and depressed mood 12/17/2016  . Anemia   . Anemia affecting pregnancy, antepartum 12/17/2016  . Asthma   . Chorioamnionitis, delivered, current hospitalization 12/17/2016  . Depression   . Hx of cellulitis of skin with lymphangitis    from last CS  . Hypothyroidism 12/17/2016  . Thyroid dysfunction in pregnancy in third trimester    Past Surgical History:  Procedure Laterality Date  . CESAREAN SECTION N/A 12/16/2016   Procedure: CESAREAN SECTION;  Surgeon: Azucena Fallen, MD;  Location: [facility];  Service: Obstetrics;  Laterality: N/A;  .  CESAREAN SECTION WITH BILATERAL TUBAL LIGATION Bilateral 05/25/2018   Procedure: Repeat CESAREAN SECTION WITH BILATERAL TUBAL LIGATION;  Surgeon: Azucena Fallen, MD;  Location: [facility];  Service: Obstetrics;  Laterality: Bilateral;  EDD: 05/22/18  . EXTRACORPOREAL SHOCK WAVE LITHOTRIPSY Right 06/24/2019   Procedure: EXTRACORPOREAL SHOCK WAVE LITHOTRIPSY (ESWL);  Surgeon: Kathie Rhodes, MD;  Location: [facility];  Service: Urology;  Laterality: Right;  75 MINS  . GANGLION CYST EXCISION Left 4/15   3 rd toe  . HERNIA REPAIR Right 1991  . IR ANGIOGRAM PELVIS SELECTIVE OR SUPRASELECTIVE  05/28/2018  . IR ANGIOGRAM SELECTIVE EACH ADDITIONAL VESSEL  05/28/2018  . IR ANGIOGRAM SELECTIVE EACH ADDITIONAL VESSEL  05/28/2018  . IR ANGIOGRAM SELECTIVE EACH ADDITIONAL VESSEL  05/28/2018  . IR EMBO ART  VEN HEMORR LYMPH EXTRAV  INC GUIDE ROADMAPPING  05/28/2018  . IR US GUIDE VASC ACCESS RIGHT  05/28/2018  . TONSILLECTOMY  1997     A IV Location/Drains/Wounds Patient Lines/Drains/Airways Status   Active Line/Drains/Airways    Name:   Placement date:   Placement time:   Site:   Days:   Peripheral IV 06/26/19 Right Forearm   06/26/19    0044    Forearm   less than 1   Incision (Closed) 12/16/16 Abdomen Other (Comment)   12/16/16    2256     922   Incision (Closed) 12/16/16 Perineum Other (Comment)   12/16/16    2257     922   Incision (Closed) 05/25/18 Vagina   05/25/18    2979  397   Incision (Closed) 05/25/18 Abdomen   05/25/18    1015     397   Incision (Closed) Groin Anterior;Proximal;Right   --    --               Intake/Output Last 24 hours  Intake/Output Summary (Last 24 hours) at 06/26/2019 1137 Last data filed at 06/26/2019 0948 Gross per 24 hour  Intake 2387.33 ml  Output --  Net 2387.33 ml    Labs/Imaging Results for orders placed or performed during the hospital encounter of 06/25/19 (from the past 48 hour(s))  Comprehensive metabolic panel     Status: Abnormal    Collection Time: 06/25/19 10:31 PM  Result Value Ref Range   Sodium 132 (L) 135 - 145 mmol/L   Potassium 4.0 3.5 - 5.1 mmol/L   Chloride 101 98 - 111 mmol/L   CO2 23 22 - 32 mmol/L   Glucose, Bld 135 (H) 70 - 99 mg/dL    Comment: Glucose reference range applies only to samples taken after fasting for at least 8 hours.   BUN 6 6 - 20 mg/dL   Creatinine, Ser 1.30 0.44 - 1.00 mg/dL   Calcium 8.3 (L) 8.9 - 10.3 mg/dL   Total Protein 7.3 6.5 - 8.1 g/dL   Albumin 3.7 3.5 - 5.0 g/dL   AST 24 15 - 41 U/L   ALT 24 0 - 44 U/L   Alkaline Phosphatase 56 38 - 126 U/L   Total Bilirubin 0.8 0.3 - 1.2 mg/dL   GFR calc non Af Amer >60 >60 mL/min   GFR calc Af Amer >60 >60 mL/min   Anion gap 8 5 - 15    Comment: Performed at [facility], [address] [place], [place] [address]  CBC with Differential     Status: Abnormal   Collection Time: 06/25/19 10:31 PM  Result Value Ref Range   WBC 11.2 (H) 4.0 - 10.5 K/uL   RBC 3.79 (L) 3.87 - 5.11 MIL/uL   Hemoglobin 11.7 (L) 12.0 - 15.0 g/dL   HCT 46.9 62.9 - 52.8 %   MCV 96.3 80.0 - 100.0 fL   MCH 30.9 26.0 - 34.0 pg   MCHC 32.1 30.0 - 36.0 g/dL   RDW 41.3 24.4 - 01.0 %   Platelets 309 150 - 400 K/uL   nRBC 0.0 0.0 - 0.2 %   Neutrophils Relative % 81 %   Neutro Abs 9.0 (H) 1.7 - 7.7 K/uL   Lymphocytes Relative 9 %   Lymphs Abs 1.0 0.7 - 4.0 K/uL   Monocytes Relative 10 %   Monocytes Absolute 1.1 (H) 0.1 - 1.0 K/uL   Eosinophils Relative 0 %   Eosinophils Absolute 0.0 0.0 - 0.5 K/uL   Basophils Relative 0 %   Basophils Absolute 0.0 0.0 - 0.1 K/uL   Immature Granulocytes 0 %   Abs Immature Granulocytes 0.05 0.00 - 0.07 K/uL    Comment: Performed at [facility], [address] [place], [place] [address]  Protime-INR     Status: None   Collection Time: 06/25/19 10:31 PM  Result Value Ref Range   Prothrombin Time 13.3 11.4 - 15.2 seconds   INR 1.0 0.8 - 1.2    Comment: (NOTE) INR goal varies based on  device and disease states. Performed at [facility], [address] [place], [place] [address]   Lactic acid, plasma     Status: None  Collection Time: 06/26/19 12:31 AM  Result Value Ref Range   Lactic Acid, Venous 0.8 0.5 - 1.9 mmol/L    Comment: Performed at [facility], [address], [place], [place] [address]  Urinalysis, Routine w reflex microscopic     Status: Abnormal   Collection Time: 06/26/19 12:55 AM  Result Value Ref Range   Color, Urine YELLOW YELLOW   APPearance CLEAR CLEAR   Specific Gravity, Urine 1.009 1.005 - 1.030   pH 6.0 5.0 - 8.0   Glucose, UA NEGATIVE NEGATIVE mg/dL   Hgb urine dipstick MODERATE (A) NEGATIVE   Bilirubin Urine NEGATIVE NEGATIVE   Ketones, ur NEGATIVE NEGATIVE mg/dL   Protein, ur 30 (A) NEGATIVE mg/dL   Nitrite NEGATIVE NEGATIVE   Leukocytes,Ua NEGATIVE NEGATIVE   RBC / HPF >50 (H) 0 - 5 RBC/hpf   WBC, UA 6-10 0 - 5 WBC/hpf   Bacteria, UA RARE (A) NONE SEEN   Squamous Epithelial / LPF 0-5 0 - 5    Comment: Performed at [facility], [address] [place], [place] [address]  I-Stat beta hCG blood, ED     Status: Abnormal   Collection Time: 06/26/19  1:11 AM  Result Value Ref Range   I-stat hCG, quantitative 6.3 (H) <5 mIU/mL   Comment 3            Comment:   GEST. AGE      CONC.  (mIU/mL)   <=1 WEEK        5 - 50     2 WEEKS       50 - 500     3 WEEKS       100 - 10,000     4 WEEKS     1,000 - 30,000        FEMALE AND NON-PREGNANT FEMALE:     LESS THAN 5 mIU/mL   I-stat chem 8, ED (not at [facility] or [facility])     Status: Abnormal   Collection Time: 06/26/19  1:13 AM  Result Value Ref Range   Sodium 131 (L) 135 - 145 mmol/L   Potassium 3.9 3.5 - 5.1 mmol/L   Chloride 100 98 - 111 mmol/L   BUN 5 (L) 6 - 20 mg/dL   Creatinine, Ser 1.82 0.44 - 1.00 mg/dL   Glucose, Bld 993 (H) 70 - 99 mg/dL    Comment: Glucose reference range applies only to samples taken after fasting for at  least 8 hours.   Calcium, Ion 1.12 (L) 1.15 - 1.40 mmol/L   TCO2 28 22 - 32 mmol/L   Hemoglobin 11.9 (L) 12.0 - 15.0 g/dL   HCT 71.6 (L) 96.7 - 89.3 %  Respiratory Panel by RT PCR (Flu A&B, Covid) - Nasopharyngeal Swab     Status: None   Collection Time: 06/26/19  2:58 AM   Specimen: Nasopharyngeal Swab  Result Value Ref Range   SARS Coronavirus 2 by RT PCR NEGATIVE NEGATIVE    Comment: (NOTE) SARS-CoV-2 target nucleic acids are NOT DETECTED. The SARS-CoV-2 RNA is generally detectable in upper respiratoy specimens during the acute phase of infection. The lowest concentration of SARS-CoV-2 viral copies this assay can detect is 131 copies/mL. A negative result does not preclude SARS-Cov-2 infection and should not be used as the sole basis for treatment or other patient management decisions. A negative result may occur with  improper specimen collection/handling, submission of specimen other than nasopharyngeal swab, presence of viral mutation(s) within  the areas targeted by this assay, and inadequate number of viral copies (<131 copies/mL). A negative result must be combined with clinical observations, patient history, and epidemiological information. The expected result is Negative. Fact Sheet for Patients:  https://www.fda.gov/media/142436/download Fact Sheet for Healthcare Providers:  https://www.young.biz/https://www.fda.gov/media/142435/download This test is not yet ap proved or cleared by the [place]://www.moore.com/ted [place] FDA and  has been authorized for detection and/or diagnosis of SARS-CoV-2 by FDA under an Emergency Use Authorization (EUA). This EUA will remain  in effect (meaning this test can be used) for the duration of the COVID-19 declaration under Section 564(b)(1) of the Act, 21 U.S.C. section 360bbb-3(b)(1), unless the authorization is terminated or revoked sooner.    Influenza A by PCR NEGATIVE NEGATIVE   Influenza B by PCR NEGATIVE NEGATIVE    Comment: (NOTE) The Xpert Xpress SARS-CoV-2/FLU/RSV  assay is intended as an aid in  the diagnosis of influenza from Nasopharyngeal swab specimens and  should not be used as a sole basis for treatment. Nasal washings and  aspirates are unacceptable for Xpert Xpress SARS-CoV-2/FLU/RSV  testing. Fact Sheet for Patients: https://www.moore.com/https://www.fda.gov/media/142436/download Fact Sheet for Healthcare Providers: https://www.young.biz/https://www.fda.gov/media/142435/download This test is not yet approved or cleared by the [place] FDA and  has been authorized for detection and/or diagnosis of SARS-CoV-2 by  FDA under an Emergency Use Authorization (EUA). This EUA will remain  in effect (meaning this test can be used) for the duration of the  Covid-19 declaration under Section 564(b)(1) of the Act, 21  U.S.C. section 360bbb-3(b)(1), unless the authorization is  terminated or revoked. Performed at [facility], [address] [place], [place] [id number]    DG Chest 2 View  Result Date: 06/25/2019 CLINICAL DATA:  39 year old female with suspected sepsis. EXAM: CHEST - 2 VIEW COMPARISON:  Chest radiograph dated 03/23/2019. FINDINGS: The heart size and mediastinal contours are within normal limits. Both lungs are clear. The visualized skeletal structures are unremarkable. IMPRESSION: No active cardiopulmonary disease. Electronically Signed   By: Elgie CollardArash  Radparvar M.D.   On: 06/25/2019 22:48   CT ABDOMEN PELVIS W CONTRAST  Result Date: 06/26/2019 CLINICAL DATA:  39 year old female with history of lithotripsy on Thursday. Postprocedural pain and fever. EXAM: CT ABDOMEN AND PELVIS WITH CONTRAST TECHNIQUE: Multidetector CT imaging of the abdomen and pelvis was performed using the standard protocol following bolus administration of intravenous contrast. CONTRAST:  100mL OMNIPAQUE IOHEXOL 300 MG/ML  SOLN COMPARISON:  CT the abdomen and pelvis 06/18/2019. FINDINGS: Lower chest: Trace volume of right pleural fluid and minimal subsegmental atelectasis in the base of the  right lower lobe. Hepatobiliary: 1.3 x 0.8 cm low-attenuation area in segment 6 of the liver (axial image 46 of series 6). No other suspicious hepatic lesions. No intra or extrahepatic biliary ductal dilatation. Gallbladder is normal in appearance. Pancreas: No pancreatic mass. No pancreatic ductal dilatation. No pancreatic or peripancreatic fluid collections or inflammatory changes. Spleen: Unremarkable. Adrenals/Urinary Tract: Left kidney and bilateral adrenal glands are normal in appearance. Previously noted calculus in the proximal third of the right ureter is not identified. No calculus fragments are identified along the course of the right ureter or within the lumen of the urinary bladder. Mild hypoenhancement of the right kidney when compared to the contralateral side, which may reflect some resolving parenchymal edema in the setting of recent lithotripsy. Trace amount of perinephric fluid and soft tissue stranding, presumably related to recent lithotripsy. No well-defined perinephric fluid collection. Specifically, no evidence of subcapsular hematoma. Urinary bladder is normal  in appearance. Stomach/Bowel: Normal appearance of the stomach. No pathologic dilatation of small bowel or colon. Normal appendix. Vascular/Lymphatic: No significant atherosclerotic disease, aneurysm or dissection noted in the abdominal or pelvic vasculature. No lymphadenopathy noted in the abdomen or pelvis. Reproductive: Uterus and ovaries are unremarkable in appearance. Other: No significant volume of ascites.  No pneumoperitoneum. Musculoskeletal: There are no aggressive appearing lytic or blastic lesions noted in the visualized portions of the skeleton. IMPRESSION: 1. Postprocedural changes of recent lithotripsy with trace amount of right perinephric fluid and soft tissue stranding, mild edema in the right kidney, and what appears to be a very small contusion of the inferior aspect of segment 6 of the liver adjacent to the  inferior pole of the right kidney. No subcapsular hematoma or other significant acute complicating features. Previously noted right proximal ureteral calculus is no longer identified. No calculus fragments along the course of the right kidney or within the lumen of the urinary bladder. 2. Trace right pleural effusion with minimal subsegmental atelectasis in the dependent right lower lobe. Electronically Signed   By: Trudie Reed M.D.   On: 06/26/2019 05:52    Pending Labs Unresulted Labs (From admission, onward)    Start     Ordered   06/27/19 0500  CBC  Tomorrow morning,   R     06/26/19 0831   06/27/19 0500  Comprehensive metabolic panel  Tomorrow morning,   R     06/26/19 0831   06/27/19 0500  Magnesium  Tomorrow morning,   R     06/26/19 0831   06/27/19 0500  C-reactive protein  Tomorrow morning,   R     06/26/19 0831   06/27/19 0500  Creatinine, serum  Every 48 hours,   R    Comments: While on vancomycin - may discontinue once vanc stopped    06/26/19 0853   06/26/19 0712  Urine culture  ONCE - STAT,   STAT     06/26/19 0711   06/25/19 2231  Lactic acid, plasma  Now then every 2 hours,   STAT     06/25/19 2231   06/25/19 2231  Culture, blood (Routine x 2)  BLOOD CULTURE X 2,   STAT     06/25/19 2231          Vitals/Pain Today's Vitals   06/26/19 1030 06/26/19 1100 06/26/19 1104 06/26/19 1130  BP: 116/82 124/84  132/75  Pulse: (!) 115 (!) 115  (!) 110  Resp: (!) 25 (!) 25  (!) 21  Temp:   100.1 F (37.8 C)   TempSrc:   Oral   SpO2: 95% 95%  95%  Weight:      Height:      PainSc:        Isolation Precautions No active isolations  Medications Medications  sodium chloride 0.9 % bolus 1,000 mL (0 mLs Intravenous Stopped 06/26/19 0333)    Followed by  0.9 %  sodium chloride infusion (1,000 mLs Intravenous New Bag/Given 06/26/19 0338)  sodium chloride (PF) 0.9 % injection (has no administration in time range)  acetaminophen (TYLENOL) tablet 1,000 mg (has no  administration in time range)  oxyCODONE-acetaminophen (PERCOCET/ROXICET) 5-325 MG per tablet 1-2 tablet (1 tablet Oral Given 06/26/19 0914)  busPIRone (BUSPAR) tablet 5 mg (5 mg Oral Given 06/26/19 1017)  sertraline (ZOLOFT) tablet 100 mg (has no administration in time range)  levothyroxine (SYNTHROID) tablet 75 mcg (has no administration in time range)  gabapentin (NEURONTIN) capsule 100 mg (  has no administration in time range)  albuterol (PROVENTIL) (2.5 MG/3ML) 0.083% nebulizer solution 2.5 mg (has no administration in time range)  loratadine (CLARITIN) tablet 10 mg (has no administration in time range)  ketorolac (TORADOL) 30 MG/ML injection 30 mg (30 mg Intravenous Given 06/26/19 1110)  ondansetron (ZOFRAN) tablet 4 mg (has no administration in time range)    Or  ondansetron (ZOFRAN) injection 4 mg (has no administration in time range)  0.9 %  sodium chloride infusion ( Intravenous New Bag/Given 06/26/19 0915)  vancomycin (VANCOCIN) IVPB 750 mg/150 ml premix (has no administration in time range)  ceFEPIme (MAXIPIME) 2 g in sodium chloride 0.9 % 100 mL IVPB (0 g Intravenous Stopped 06/26/19 0948)  fentaNYL (SUBLIMAZE) injection 50 mcg (50 mcg Intravenous Given 06/26/19 0050)  ondansetron (ZOFRAN) injection 4 mg (4 mg Intravenous Given 06/26/19 0048)  cefTRIAXone (ROCEPHIN) 1 g in sodium chloride 0.9 % 100 mL IVPB (0 g Intravenous Stopped 06/26/19 0153)  ketorolac (TORADOL) 15 MG/ML injection 15 mg (15 mg Intravenous Given 06/26/19 0216)  acetaminophen (TYLENOL) tablet 1,000 mg (1,000 mg Oral Given 06/26/19 0337)  iohexol (OMNIPAQUE) 300 MG/ML solution 100 mL (100 mLs Intravenous Contrast Given 06/26/19 0426)  metoCLOPramide (REGLAN) injection 10 mg (10 mg Intravenous Given 06/26/19 0730)  sodium chloride 0.9 % bolus 1,000 mL (0 mLs Intravenous Stopped 06/26/19 0908)  vancomycin (VANCOCIN) IVPB 1000 mg/200 mL premix (0 mg Intravenous Stopped 06/26/19 0925)    Mobility walks      Recommendations:  See Admitting Provider Note  Report given to: Norma Fredrickson, RN  Additional Notes:

## 2019-06-27 ENCOUNTER — Inpatient Hospital Stay (HOSPITAL_COMMUNITY): Payer: 59

## 2019-06-27 ENCOUNTER — Other Ambulatory Visit: Payer: Self-pay | Admitting: Diagnostic Radiology

## 2019-06-27 DIAGNOSIS — D649 Anemia, unspecified: Secondary | ICD-10-CM

## 2019-06-27 DIAGNOSIS — D72829 Elevated white blood cell count, unspecified: Secondary | ICD-10-CM | POA: Diagnosis not present

## 2019-06-27 DIAGNOSIS — G039 Meningitis, unspecified: Secondary | ICD-10-CM

## 2019-06-27 DIAGNOSIS — E039 Hypothyroidism, unspecified: Secondary | ICD-10-CM | POA: Diagnosis not present

## 2019-06-27 DIAGNOSIS — R509 Fever, unspecified: Secondary | ICD-10-CM | POA: Diagnosis not present

## 2019-06-27 DIAGNOSIS — A419 Sepsis, unspecified organism: Secondary | ICD-10-CM | POA: Diagnosis not present

## 2019-06-27 LAB — CBC
HCT: 31.4 % — ABNORMAL LOW (ref 36.0–46.0)
Hemoglobin: 9.7 g/dL — ABNORMAL LOW (ref 12.0–15.0)
MCH: 31.3 pg (ref 26.0–34.0)
MCHC: 30.9 g/dL (ref 30.0–36.0)
MCV: 101.3 fL — ABNORMAL HIGH (ref 80.0–100.0)
Platelets: 244 10*3/uL (ref 150–400)
RBC: 3.1 MIL/uL — ABNORMAL LOW (ref 3.87–5.11)
RDW: 13.1 % (ref 11.5–15.5)
WBC: 25.2 10*3/uL — ABNORMAL HIGH (ref 4.0–10.5)
nRBC: 0 % (ref 0.0–0.2)

## 2019-06-27 LAB — PROTEIN, CSF: Total  Protein, CSF: 30 mg/dL (ref 15–45)

## 2019-06-27 LAB — MAGNESIUM: Magnesium: 2 mg/dL (ref 1.7–2.4)

## 2019-06-27 LAB — COMPREHENSIVE METABOLIC PANEL
ALT: 32 U/L (ref 0–44)
AST: 24 U/L (ref 15–41)
Albumin: 2.7 g/dL — ABNORMAL LOW (ref 3.5–5.0)
Alkaline Phosphatase: 53 U/L (ref 38–126)
Anion gap: 8 (ref 5–15)
BUN: 8 mg/dL (ref 6–20)
CO2: 18 mmol/L — ABNORMAL LOW (ref 22–32)
Calcium: 7.3 mg/dL — ABNORMAL LOW (ref 8.9–10.3)
Chloride: 114 mmol/L — ABNORMAL HIGH (ref 98–111)
Creatinine, Ser: 0.7 mg/dL (ref 0.44–1.00)
GFR calc Af Amer: >60 mL/min (ref >60)
GFR calc non Af Amer: >60 mL/min (ref >60)
Glucose, Bld: 117 mg/dL — ABNORMAL HIGH (ref 70–99)
Potassium: 3.5 mmol/L (ref 3.5–5.1)
Sodium: 140 mmol/L (ref 135–145)
Total Bilirubin: 0.8 mg/dL (ref 0.3–1.2)
Total Protein: 6 g/dL — ABNORMAL LOW (ref 6.5–8.1)

## 2019-06-27 LAB — HCG, SERUM, QUALITATIVE: Preg, Serum: NEGATIVE

## 2019-06-27 LAB — CSF CELL COUNT WITH DIFFERENTIAL
RBC Count, CSF: 2 /mm3 — ABNORMAL HIGH
Tube #: 1
WBC, CSF: 4 /mm3 (ref 0–5)

## 2019-06-27 LAB — URINE CULTURE: Culture: <10000 — AB

## 2019-06-27 LAB — C-REACTIVE PROTEIN: CRP: 26.4 mg/dL — ABNORMAL HIGH (ref <1.0)

## 2019-06-27 LAB — GLUCOSE, CSF: Glucose, CSF: 62 mg/dL (ref 40–70)

## 2019-06-27 MED ORDER — SODIUM CHLORIDE 0.9 % IV SOLN
2.0000 g | Freq: Two times a day (BID) | INTRAVENOUS | Status: DC
  Administered 2019-06-27: 2 g via INTRAVENOUS
  Filled 2019-06-27: qty 20
  Filled 2019-06-27: qty 2

## 2019-06-27 MED ORDER — SODIUM CHLORIDE 0.9 % IV BOLUS
1000.0000 mL | Freq: Once | INTRAVENOUS | Status: AC
  Administered 2019-06-27: 1000 mL via INTRAVENOUS

## 2019-06-27 MED ORDER — POTASSIUM CHLORIDE CRYS ER 20 MEQ PO TBCR
40.0000 meq | EXTENDED_RELEASE_TABLET | Freq: Two times a day (BID) | ORAL | Status: AC
  Administered 2019-06-27 (×2): 40 meq via ORAL
  Filled 2019-06-27 (×2): qty 2

## 2019-06-27 MED ORDER — DEXTROSE 5 % IV SOLN
675.0000 mg | Freq: Three times a day (TID) | INTRAVENOUS | Status: DC
  Filled 2019-06-27 (×2): qty 13.5

## 2019-06-27 MED ORDER — DEXTROSE 5 % IV SOLN
675.0000 mg | Freq: Three times a day (TID) | INTRAVENOUS | Status: DC
  Administered 2019-06-27: 675 mg via INTRAVENOUS
  Filled 2019-06-27 (×3): qty 13.5

## 2019-06-27 MED ORDER — SODIUM CHLORIDE 0.9 % IV SOLN
2.0000 g | Freq: Three times a day (TID) | INTRAVENOUS | Status: DC
  Administered 2019-06-27 – 2019-06-29 (×5): 2 g via INTRAVENOUS
  Filled 2019-06-27 (×7): qty 2

## 2019-06-27 MED ORDER — GADOBUTROL 1 MMOL/ML IV SOLN
7.0000 mL | Freq: Once | INTRAVENOUS | Status: AC | PRN
  Administered 2019-06-27: 7 mL via INTRAVENOUS

## 2019-06-27 MED ORDER — MEROPENEM 1 G IV SOLR: 2.0000 g | Freq: Three times a day (TID) | INTRAVENOUS | Status: DC

## 2019-06-27 MED ORDER — SODIUM CHLORIDE 0.9 % IV SOLN: 2.0000 g | INTRAVENOUS | Status: DC

## 2019-06-27 MED ORDER — SODIUM BICARBONATE-DEXTROSE 150-5 MEQ/L-% IV SOLN
150.0000 meq | INTRAVENOUS | Status: DC
  Administered 2019-06-27 – 2019-06-28 (×3): 150 meq via INTRAVENOUS
  Filled 2019-06-27 (×4): qty 1000

## 2019-06-27 MED ORDER — VANCOMYCIN HCL 750 MG/150ML IV SOLN
750.0000 mg | Freq: Three times a day (TID) | INTRAVENOUS | Status: DC
  Administered 2019-06-27 – 2019-06-28 (×3): 750 mg via INTRAVENOUS
  Filled 2019-06-27 (×6): qty 150

## 2019-06-27 MED ORDER — DEXTROSE 5 % IV SOLN
675.0000 mg | Freq: Three times a day (TID) | INTRAVENOUS | Status: DC
  Administered 2019-06-28 (×3): 675 mg via INTRAVENOUS
  Filled 2019-06-27 (×4): qty 13.5

## 2019-06-27 NOTE — Progress Notes (Signed)
Pt HR is 130 , EKG confirm's that the pt is in Sinus Tach, paged NP [place] order given for 1000 cc bolus. I will continue to monitor.

## 2019-06-27 NOTE — Progress Notes (Addendum)
Pharmacy Antibiotic Note  Megan Lara is a 39 y.o. female admitted on 06/25/2019 with urosepsis; now with suspicion for meningitis. Pharmacy consults for vanc/cefepime have been changed to vanc/acyclovir; ceftriaxone per MD. LP ordered.  Today, 06/27/2019:  Marked increase in WBC overnight  Last fevers 24 hr ago  SCr stable WNL  CRP elevated  Plan:  Increase ceftriaxone to 2g IV q12 hr for meningitis dosing  Increase vancomycin to 750 mg IV q8 hr per nomogram (not dosing by AUC)  Measure vancomycin AUC at steady state as indicated  SCr q48 while on vanc  Acyclovir 675 mg (10 mg/kg) IV q8 hr; ensure adequate hydration   Height: 5\' 6"  (167.6 cm) Weight: 68 kg (150 lb) IBW/kg (Calculated) : 59.3  Temp (24hrs), Avg:99.8 F (37.7 C), Min:98.5 F (36.9 C), Max:102.4 F (39.1 C)  Recent Labs  Lab 06/25/19 2231 06/26/19 0031 06/26/19 0113 06/27/19 0656  WBC 11.2*  --   --  25.2*  CREATININE 0.77  --  0.90 0.70  LATICACIDVEN  --  0.8  --   --     Estimated Creatinine Clearance: 91 mL/min (by C-G formula based on SCr of 0.7 mg/dL).    No Known Allergies  Antimicrobials this admission: 4/24 vanc >>  4/24 cefepime >> 4/25 ceftriaxone >> 4/25 acyclovir >>   Dose adjustments this admission:  4/25 Increase vanc to 750 q8 hr to target trough 15 mcg/mL for meningitis  Microbiology results: 4/23 BCx: sent 4/24 UCx: <10k colonies 4/25 CSF: ordered    Thank you for allowing pharmacy to be a part of this patient's care.  ,  A 06/27/2019 12:04 PM

## 2019-06-27 NOTE — Progress Notes (Signed)
Pt in Yellow MEWS due to elevated HR:117; and elevated Temp: 101.4. Consulting civil engineer notified. MD notified and placed orders to administer 1L NS Bolus, and administer prn medications. RN administered prn tylenol before the pt went into the yellow MEWS. RN also administered prn Advil and prn albuterol for c/o chest tightness and shortness of breath. Will continue to monitor closely

## 2019-06-27 NOTE — Progress Notes (Signed)
RN reassessed pt after admin of prn medications and the pt is now in the Green MEWS and temperature is back WDL. Pt is resting. Will continue on Yellow MEWS and will monitor closely.

## 2019-06-27 NOTE — Progress Notes (Signed)
PROGRESS NOTE    Megan Lara  INO:[id number] DOB: February 24, 1981 DOA: 06/25/2019 PCP: Mila Palmer, MD   Brief Narrative:  HPI per Dr. Glade Lloyd on 06/26/19 Megan Lara is a 39 y.o. female with medical history significant of Asthma,  Hypothyroidism, depression, right ureteral stone status post extracorporeal shockwave lithotripsy on 06/24/2019 by Dr. Ottelin/urology presented with fever since yesterday.  Patient is having high-grade fever with chills since yesterday but denies worsening cough, shortness of breath, chest pain, vomiting, abdominal pain, diarrhea.  She denies any increased frequency of urination or dysuria but complains of some flank pain along with nausea.  She has very poor appetite.  Denies loss of consciousness, seizures, rash.  Has not had her Covid vaccine yet.  ED Course: Patient was found to be febrile, tachycardic with mild leukocytosis.  She was started on IV fluids and antibiotics.  CT of the abdomen and pelvis with contrast showed postprocedural changes of recent lithotripsy with trace amount of right perinephric fluid and soft tissue stranding but no obstructing stone.  Chest x-ray was negative for infiltrates.  **Interim History She thought to have a urinary tract infection however based on examination today she appears to have meningitis given her severe headache, photophobia as well as neck pain and mild stiffness.  She did spike a temperature of 102.4 yesterday and she has no other focalizing symptoms of infection denies any shortness of breath, nausea or vomiting.  Will obtain MRI of the brain with and without contrast as well as an LP to further delineate and start the patient on IV ceftriaxone, IV vancomycin and IV acyclovir  Assessment & Plan:   Principal Problem:   Fever Active Problems:   Hypothyroidism   Leukocytosis   Sepsis (HCC)  Sepsis present on admission initially thought to be from a complicated UTI but now likely  secondary to meningitis Leukocytosis  -Presented with a temperature 102.4, pulse rate of 144, respiratory rate of 31 and hypotension as well as a leukocytosis and now likely a source of infection and the meninges -Initially thought to be from complicated UTI in a patient with right ureteral stone status post recent extracorporeal shockwave lithotripsy on 06/24/2019 however she is not have any urinary symptoms and urinalysis was not very revealing as it showed a clear appearance with moderate hemoglobin but rare bacteria and greater than 50 RBCs per high-power field; also has 0-5, epithelial cells and 6-10 WBCs and urine culture showed less than 10,000 colonies of insignificant growth -Blood cultures x2 are pending -Patient had ESWL on 06/24/2019 by Dr. Ottelin/urology and presented with fever since yesterday with tachycardia and mild leukocytosis -She is now complaining of significant headache and she has some neck pain and some stiffness; states that she is admitted in January for meningitis -CT of the abdomen and pelvis showed postprocedural changes of recent lithotripsy with trace amount of right perinephric fluid and soft tissue stranding, mild edema in the right kidney but no obstructing stone. -We will continue broad-spectrum antibiotics for now including cefepime and vancomycin however we have changed to Augmentin diabetes antibiotics with IV ceftriaxone 2 g, IV vancomycin and have added IV acyclovir -Dr. Hanley Ben spoke to Dr. Winter/urology who recommended conservative medical management and no need for current surgical intervention.  Outpatient follow-up with urology -Because of the constellation of her symptoms and her fever of 102.4, headache and neck stiffness we have strong suspicion for meningitis -I spoke to neurology who recommended changing to the cefepime to meropenem for broader  coverage and was adding IV acyclovir.  Dr. Jerrell Belfast recommends obtaining an MRI of the brain with and without  contrast as well as an LP next-both of these have been requested to be done stat -I also spoke with Dr. Ilsa Iha of the suspected diseases who does recommend continuing antibiotics but de-escalating meropenem to ceftriaxone which would be done now; now the patient is on IV vancomycin, IV ceftriaxone, IV acyclovir -Currently has a significant headache so we will continue IV ketorolac every 6 hours as needed for severe pain, ibuprofen 600 mg p.o. every 6 as needed for fevers not responding to Tylenol as well as oxycodone-acetaminophen 1-2 tabs p.o. every 6 hours as needed severe pain -WBC has acutely worsened and gone from 11.2 is now 25.2 -Follow-up on lumbar puncture as well as MRI of the brain earlier done -CRP was 26.4 -Her SARS-CoV-2 testing was negative and chest x-ray is negative for any acute abnormalities -She is given a 1 L bolus and will continued on normal saline at a rate of 125 mL's per hour however this was changed to sodium bicarbonate 150 mEq in D5 at 100 mL/h -Continue supportive care and antiemetics with p.o./IV Decadron 4 mg every 6 hours as needed nausea -Lactic acid level on admission was 0.8 -Continue to monitor for signs and symptoms of infection  Hyponatremia -Patient sodium on admission was 131 and is now improved to 140 -Continued with normal saline at 125 MLS per hour with no change to fluids as above with sodium bicarbonate next-continue to monitor and trend and repeat CMP in a.m.  Hypothyroidism -Check TSH in the a.m. -Continue levothyroxine  Asthma -Currently controlled.    Continue with albuterol 2.5 mg IH every 6 as needed for wheezing  Depression  -Continue Sertraline 100 mg p.o. nightly, as well as buspirone 5 mg p.o. twice daily  Metabolic Acidosis -Patient's CO2 is now 18, chloride level is 114, and anion gap is 8 \ -She was given normal saline at 125 mils per hour but we have changed this to sodium bicarbonate 150 mEq in D5 at 100 MLS per  hour -Continue to monitor and repeat CMP in a.m.  Normocytic Anemia now with a Macrocytosis/Macrocytic Anemia -Patient's hemoglobin/hematocrit went from 11.7/36.5 on admission is now 9.7/31.4 and likely dilutional drop; MCV is now 101.3  -Check anemia panel in the a.m.  -Continue to monitor for signs and symptoms of bleeding; Currently no overt bleeding noted -Repeat CBC in AM    DVT prophylaxis: SCDs  Code Status: FULL CODE  Family Communication: Discussed with Husband at bedside  Disposition Plan: Patient is from home now presenting with a severe headache and fever with concern for meningitis so she will need to remain inpatient for further work-up and will need an MRI as well as an LP  Status is: Inpatient  Remains inpatient appropriate because:Ongoing diagnostic testing needed not appropriate for outpatient work up, Unsafe d/c plan, IV treatments appropriate due to intensity of illness or inability to take PO and Inpatient level of care appropriate due to severity of illness   Dispo: The patient is from: Home              Anticipated d/c is to: Home              Anticipated d/c date is: 2 days              Patient currently is not medically stable to d/c.  Consultants:   Discussed Case with Neurology Dr.  Wilford Corner  Discussed Case with Infectious Diseases Dr. Judyann Munson    Procedures:  MRI Brain w/wo Contrast Lumbar Puncture   Antimicrobials:  Anti-infectives (From admission, onward)   Start     Dose/Rate Route Frequency Ordered Stop   06/27/19 1400  vancomycin (VANCOREADY) IVPB 750 mg/150 mL     750 mg 150 mL/hr over 60 Minutes Intravenous Every 8 hours 06/27/19 1201     06/27/19 1400  acyclovir (ZOVIRAX) 675 mg in dextrose 5 % 100 mL IVPB  Status:  Discontinued     675 mg 113.5 mL/hr over 60 Minutes Intravenous Every 8 hours 06/27/19 1201 06/27/19 1203   06/27/19 1230  acyclovir (ZOVIRAX) 675 mg in dextrose 5 % 100 mL IVPB     675 mg 113.5 mL/hr over 60 Minutes  Intravenous Every 8 hours 06/27/19 1203     06/27/19 1215  cefTRIAXone (ROCEPHIN) 2 g in sodium chloride 0.9 % 100 mL IVPB     2 g 200 mL/hr over 30 Minutes Intravenous Every 12 hours 06/27/19 1201     06/27/19 1200  cefTRIAXone (ROCEPHIN) 2 g in sodium chloride 0.9 % 100 mL IVPB  Status:  Discontinued     2 g 200 mL/hr over 30 Minutes Intravenous Every 24 hours 06/27/19 1146 06/27/19 1201   06/26/19 1800  vancomycin (VANCOCIN) IVPB 750 mg/150 ml premix  Status:  Discontinued     750 mg 150 mL/hr over 60 Minutes Intravenous 2 times daily 06/26/19 0853 06/26/19 1310   06/26/19 1800  vancomycin (VANCOCIN) IVPB 750 mg/150 ml premix  Status:  Discontinued     750 mg 150 mL/hr over 60 Minutes Intravenous Every 12 hours 06/26/19 1311 06/26/19 1313   06/26/19 1800  vancomycin (VANCOCIN) IVPB 750 mg/150 ml premix  Status:  Discontinued     750 mg 150 mL/hr over 60 Minutes Intravenous Every 12 hours 06/26/19 1313 06/26/19 1316   06/26/19 1800  vancomycin (VANCOREADY) IVPB 750 mg/150 mL  Status:  Discontinued     750 mg 150 mL/hr over 60 Minutes Intravenous Every 12 hours 06/26/19 1316 06/27/19 1201   06/26/19 0900  ceFEPIme (MAXIPIME) 2 g in sodium chloride 0.9 % 100 mL IVPB  Status:  Discontinued     2 g 200 mL/hr over 30 Minutes Intravenous Every 8 hours 06/26/19 0853 06/27/19 1137   06/26/19 0800  vancomycin (VANCOCIN) IVPB 1000 mg/200 mL premix     1,000 mg 200 mL/hr over 60 Minutes Intravenous  Once 06/26/19 0745 06/26/19 0925   06/26/19 0045  cefTRIAXone (ROCEPHIN) 1 g in sodium chloride 0.9 % 100 mL IVPB     1 g 200 mL/hr over 30 Minutes Intravenous  Once 06/26/19 0030 06/26/19 0153     Subjective: Seen and examined at bedside was having a significant headache.  No chest pain, lightheadedness or dizziness.  No nausea or vomiting.  States that she did not feel well and states that she also had some neck pain.  Denies any burning or discomfort in her urine and urinating well.  States that  she also had meningitis back in January.  No other concerns or plans at this time.  Objective: Vitals:   06/27/19 0230 06/27/19 0300 06/27/19 0642 06/27/19 1027  BP: (!) 119/49  107/70 107/69  Pulse: (!) 129  87 84  Resp: (!) 21 (!) 31 18 18   Temp: 98.7 F (37.1 C)  98.6 F (37 C) 98.5 F (36.9 C)  TempSrc: Oral  Oral Oral  SpO2:  100%  97% 100%  Weight:      Height:        Intake/Output Summary (Last 24 hours) at 06/27/2019 1341 Last data filed at 06/26/2019 1500 Gross per 24 hour  Intake 1766.3 ml  Output --  Net 1766.3 ml   Filed Weights   06/25/19 2354  Weight: 68 kg   Examination: Physical Exam:  Constitutional: WN/WD Caucasian female currently in mild distress appears calm but does appear a little bit uncomfortable Eyes: Lids and conjunctivae normal, sclerae anicteric  ENMT: External Ears, Nose appear normal. Grossly normal hearing.  Neck: Appears normal, supple, no cervical masses, normal ROM, no appreciable thyromegaly; no JVD Respiratory: Clear to auscultation bilaterally, no wheezing, rales, rhonchi or crackles. Normal respiratory effort and patient is not tachypenic. No accessory muscle use.  Unlabored breathing Cardiovascular: RRR, no murmurs / rubs / gallops. S1 and S2 auscultated. No extremity edema. Abdomen: Soft, non-tender, non-distended. Bowel sounds positive.  GU: Deferred. Musculoskeletal: No clubbing / cyanosis of digits/nails. No joint deformity upper and lower extremities.  Skin: No rashes, lesions, ulcers on limited skin evaluation. No induration; Warm and dry.  Neurologic: CN 2-12 grossly intact with no focal deficits. Romberg sign and cerebellar reflexes not assessed.  Psychiatric: Normal judgment and insight. Alert and oriented x 3. Normal mood and appropriate affect.   Data Reviewed: I have personally reviewed following labs and imaging studies  CBC: Recent Labs  Lab 06/25/19 2231 06/26/19 0113 06/27/19 0656  WBC 11.2*  --  25.2*   NEUTROABS 9.0*  --   --   HGB 11.7* 11.9* 9.7*  HCT 36.5 35.0* 31.4*  MCV 96.3  --  101.3*  PLT 309  --  244   Basic Metabolic Panel: Recent Labs  Lab 06/25/19 2231 06/26/19 0113 06/27/19 0656  NA 132* 131* 140  K 4.0 3.9 3.5  CL 101 100 114*  CO2 23  --  18*  GLUCOSE 135* 134* 117*  BUN 6 5* 8  CREATININE 0.77 0.90 0.70  CALCIUM 8.3*  --  7.3*  MG  --   --  2.0   GFR: Estimated Creatinine Clearance: 91 mL/min (by C-G formula based on SCr of 0.7 mg/dL). Liver Function Tests: Recent Labs  Lab 06/25/19 2231 06/27/19 0656  AST 24 24  ALT 24 32  ALKPHOS 56 53  BILITOT 0.8 0.8  PROT 7.3 6.0*  ALBUMIN 3.7 2.7*   No results for input(s): LIPASE, AMYLASE in the last 168 hours. No results for input(s): AMMONIA in the last 168 hours. Coagulation Profile: Recent Labs  Lab 06/25/19 2231  INR 1.0   Cardiac Enzymes: No results for input(s): CKTOTAL, CKMB, CKMBINDEX, TROPONINI in the last 168 hours. BNP (last 3 results) No results for input(s): PROBNP in the last 8760 hours. HbA1C: No results for input(s): HGBA1C in the last 72 hours. CBG: No results for input(s): GLUCAP in the last 168 hours. Lipid Profile: No results for input(s): CHOL, HDL, LDLCALC, TRIG, CHOLHDL, LDLDIRECT in the last 72 hours. Thyroid Function Tests: No results for input(s): TSH, T4TOTAL, FREET4, T3FREE, THYROIDAB in the last 72 hours. Anemia Panel: No results for input(s): VITAMINB12, FOLATE, FERRITIN, TIBC, IRON, RETICCTPCT in the last 72 hours. Sepsis Labs: Recent Labs  Lab 06/26/19 0031  LATICACIDVEN 0.8    Recent Results (from the past 240 hour(s))  SARS CORONAVIRUS 2 (TAT 6-24 HRS) Nasopharyngeal Nasopharyngeal Swab     Status: None   Collection Time: 06/23/19 10:08 AM   Specimen: Nasopharyngeal  Swab  Result Value Ref Range Status   SARS Coronavirus 2 NEGATIVE NEGATIVE Final    Comment: (NOTE) SARS-CoV-2 target nucleic acids are NOT DETECTED. The SARS-CoV-2 RNA is generally  detectable in upper and lower respiratory specimens during the acute phase of infection. Negative results do not preclude SARS-CoV-2 infection, do not rule out co-infections with other pathogens, and should not be used as the sole basis for treatment or other patient management decisions. Negative results must be combined with clinical observations, patient history, and epidemiological information. The expected result is Negative. Fact Sheet for Patients: SugarRoll.be Fact Sheet for Healthcare Providers: https://www.woods-mathews.com/ This test is not yet approved or cleared by the [place] FDA and  has been authorized for detection and/or diagnosis of SARS-CoV-2 by FDA under an Emergency Use Authorization (EUA). This EUA will remain  in effect (meaning this test can be used) for the duration of the COVID-19 declaration under Section 56 4(b)(1) of the Act, 21 U.S.C. section 360bbb-3(b)(1), unless the authorization is terminated or revoked sooner. Performed at [facility], [place] [address] [place], [place] [address]   Urine culture     Status: Abnormal   Collection Time: 06/26/19 12:55 AM   Specimen: Urine, Random  Result Value Ref Range Status   Specimen Description   Final    URINE, RANDOM Performed at [facility] [address], [place], [place] [address]    Special Requests   Final    NONE Performed at [facility], [place] [address] [place], [place] [address]    Culture (A)  Final    <10,000 COLONIES/mL INSIGNIFICANT GROWTH Performed at [facility] [address], [place], [place] [address]    Report Status 06/27/2019 FINAL  Final  Respiratory Panel by RT PCR (Flu A&B, Covid) - Nasopharyngeal Swab     Status: None   Collection Time: 06/26/19  2:58 AM   Specimen: Nasopharyngeal Swab  Result Value Ref Range Status   SARS Coronavirus 2 by RT PCR NEGATIVE NEGATIVE Final     Comment: (NOTE) SARS-CoV-2 target nucleic acids are NOT DETECTED. The SARS-CoV-2 RNA is generally detectable in upper respiratoy specimens during the acute phase of infection. The lowest concentration of SARS-CoV-2 viral copies this assay can detect is 131 copies/mL. A negative result does not preclude SARS-Cov-2 infection and should not be used as the sole basis for treatment or other patient management decisions. A negative result may occur with  improper specimen collection/handling, submission of specimen other than nasopharyngeal swab, presence of viral mutation(s) within the areas targeted by this assay, and inadequate number of viral copies (<131 copies/mL). A negative result must be combined with clinical observations, patient history, and epidemiological information. The expected result is Negative. Fact Sheet for Patients:  PinkCheek.be Fact Sheet for Healthcare Providers:  GravelBags.it This test is not yet ap proved or cleared by the [place] FDA and  has been authorized for detection and/or diagnosis of SARS-CoV-2 by FDA under an Emergency Use Authorization (EUA). This EUA will remain  in effect (meaning this test can be used) for the duration of the COVID-19 declaration under Section 564(b)(1) of the Act, 21 U.S.C. section 360bbb-3(b)(1), unless the authorization is terminated or revoked sooner.    Influenza A by PCR NEGATIVE NEGATIVE Final   Influenza B by PCR NEGATIVE NEGATIVE Final    Comment: (NOTE) The Xpert Xpress SARS-CoV-2/FLU/RSV assay is intended as an aid in  the diagnosis of influenza from Nasopharyngeal  swab specimens and  should not be used as a sole basis for treatment. Nasal washings and  aspirates are unacceptable for Xpert Xpress SARS-CoV-2/FLU/RSV  testing. Fact Sheet for Patients: https://www.moore.com/ Fact Sheet for Healthcare  Providers: https://www.young.biz/ This test is not yet approved or cleared by the [place] FDA and  has been authorized for detection and/or diagnosis of SARS-CoV-2 by  FDA under an Emergency Use Authorization (EUA). This EUA will remain  in effect (meaning this test can be used) for the duration of the  Covid-19 declaration under Section 564(b)(1) of the Act, 21  U.S.C. section 360bbb-3(b)(1), unless the authorization is  terminated or revoked. Performed at [facility], [address], [place], [place] [address]      RN Pressure Injury Documentation:     Estimated body mass index is 24.21 kg/m as calculated from the following:   Height as of this encounter: 5\' 6"  (1.676 m).   Weight as of this encounter: 68 kg.  Malnutrition Type:      Malnutrition Characteristics:      Nutrition Interventions:    Radiology Studies: DG Chest 2 View  Result Date: 06/25/2019 CLINICAL DATA:  39 year old female with suspected sepsis. EXAM: CHEST - 2 VIEW COMPARISON:  Chest radiograph dated 03/23/2019. FINDINGS: The heart size and mediastinal contours are within normal limits. Both lungs are clear. The visualized skeletal structures are unremarkable. IMPRESSION: No active cardiopulmonary disease. Electronically Signed   By: 03/25/2019 M.D.   On: 06/25/2019 22:48   CT ABDOMEN PELVIS W CONTRAST  Result Date: 06/26/2019 CLINICAL DATA:  39 year old female with history of lithotripsy on Thursday. Postprocedural pain and fever. EXAM: CT ABDOMEN AND PELVIS WITH CONTRAST TECHNIQUE: Multidetector CT imaging of the abdomen and pelvis was performed using the standard protocol following bolus administration of intravenous contrast. CONTRAST:  Monday OMNIPAQUE IOHEXOL 300 MG/ML  SOLN COMPARISON:  CT the abdomen and pelvis 06/18/2019. FINDINGS: Lower chest: Trace volume of right pleural fluid and minimal subsegmental atelectasis in the base of the right lower  lobe. Hepatobiliary: 1.3 x 0.8 cm low-attenuation area in segment 6 of the liver (axial image 46 of series 6). No other suspicious hepatic lesions. No intra or extrahepatic biliary ductal dilatation. Gallbladder is normal in appearance. Pancreas: No pancreatic mass. No pancreatic ductal dilatation. No pancreatic or peripancreatic fluid collections or inflammatory changes. Spleen: Unremarkable. Adrenals/Urinary Tract: Left kidney and bilateral adrenal glands are normal in appearance. Previously noted calculus in the proximal third of the right ureter is not identified. No calculus fragments are identified along the course of the right ureter or within the lumen of the urinary bladder. Mild hypoenhancement of the right kidney when compared to the contralateral side, which may reflect some resolving parenchymal edema in the setting of recent lithotripsy. Trace amount of perinephric fluid and soft tissue stranding, presumably related to recent lithotripsy. No well-defined perinephric fluid collection. Specifically, no evidence of subcapsular hematoma. Urinary bladder is normal in appearance. Stomach/Bowel: Normal appearance of the stomach. No pathologic dilatation of small bowel or colon. Normal appendix. Vascular/Lymphatic: No significant atherosclerotic disease, aneurysm or dissection noted in the abdominal or pelvic vasculature. No lymphadenopathy noted in the abdomen or pelvis. Reproductive: Uterus and ovaries are unremarkable in appearance. Other: No significant volume of ascites.  No pneumoperitoneum. Musculoskeletal: There are no aggressive appearing lytic or blastic lesions noted in the visualized portions of the skeleton. IMPRESSION: 1. Postprocedural changes of recent lithotripsy with trace amount of right perinephric fluid and soft tissue  stranding, mild edema in the right kidney, and what appears to be a very small contusion of the inferior aspect of segment 6 of the liver adjacent to the inferior pole of  the right kidney. No subcapsular hematoma or other significant acute complicating features. Previously noted right proximal ureteral calculus is no longer identified. No calculus fragments along the course of the right kidney or within the lumen of the urinary bladder. 2. Trace right pleural effusion with minimal subsegmental atelectasis in the dependent right lower lobe. Electronically Signed   By: Trudie Reed M.D.   On: 06/26/2019 05:52   Scheduled Meds: . busPIRone  5 mg Oral BID  . gabapentin  100 mg Oral QHS  . levothyroxine  75 mcg Oral Q0600  . loratadine  10 mg Oral QHS  . potassium chloride  40 mEq Oral BID  . sertraline  100 mg Oral QHS   Continuous Infusions: . sodium chloride 1,000 mL (06/26/19 0338)  . acyclovir    . cefTRIAXone (ROCEPHIN)  IV 2 g (06/27/19 1302)  . sodium bicarbonate 150 mEq in dextrose 5% 1000 mL 150 mEq (06/27/19 1037)  . vancomycin      LOS: 1 day   Merlene Laughter, DO [facility] PAGER is on AMION  If 7PM-7AM, please contact night-coverage [email or web address]

## 2019-06-27 NOTE — Consult Note (Addendum)
[facility]  Total days of antibiotics 3       Reason for Consult: meningoecephalitis    Referring Physician: sheikh  Principal Problem:   Fever Active Problems:   Hypothyroidism   Leukocytosis   Sepsis ([place])    HPI: Megan Lara is a 39 y.o. female with history of ashtma, hypothyroidism, and aseptic meningitis in jan 2021, who underwent right utereral stone status post extracorporeal shockwave lithotripsy on 4/22 but then started to have high fevers and chills on 4/23. She denied increased urination or dysuria but did have flank pain with mild nausea. No cough, sob, chest pain, in the ed , she was febrile, tachycardic with mildly elevated wbc. Her ct showed some right perinephric fluid and soft tissue stranding no stones. Her CXR no infiltrates. She was started on vanco and cefepime due to concern for infection 2/2  urologic instrumentation. Her WBC up to 25, but she is afebrile, but complains of headache, neck pain similar to when she had aseptic meningitis.   Past Medical History:  Diagnosis Date  . Adjustment disorder with mixed anxiety and depressed mood 12/17/2016  . Anemia   . Anemia affecting pregnancy, antepartum 12/17/2016  . Asthma   . Chorioamnionitis, delivered, current hospitalization 12/17/2016  . Depression   . Hx of cellulitis of skin with lymphangitis    from last CS  . Hypothyroidism 12/17/2016  . Thyroid dysfunction in pregnancy in third trimester     Allergies: No Known Allergies   MEDICATIONS: . busPIRone  5 mg Oral BID  . gabapentin  100 mg Oral QHS  . levothyroxine  75 mcg Oral Q0600  . loratadine  10 mg Oral QHS  . potassium chloride  40 mEq Oral BID  . sertraline  100 mg Oral QHS    Social History   Tobacco Use  . Smoking status: Former Smoker    Packs/day: 0.50    Years: 7.00    Pack years: 3.50    Types: Cigarettes    Quit date: 03/04/2013    Years since quitting: 6.3  . Smokeless tobacco: Never  Used  . Tobacco comment: does some vape  Substance Use Topics  . Alcohol use: Not Currently    Alcohol/week: 0.0 standard drinks    Comment: 9 a week  . Drug use: No    Family History  Adopted: Yes  Problem Relation Age of Onset  . Uterine cancer Maternal Grandmother     Review of Systems -  12 point ros negative except what is mentioned in hpi    OBJECTIVE: Temp:  [98.5 F (36.9 C)-102.4 F (39.1 C)] 98.5 F (36.9 C) (04/25 1027) Pulse Rate:  [84-144] 84 (04/25 1027) Resp:  [18-31] 18 (04/25 1027) BP: (92-133)/(49-106) 107/69 (04/25 1027) SpO2:  [95 %-100 %] 100 % (04/25 1027) Physical Exam  Constitutional:  oriented to person, place, and time. appears well-developed and well-nourished. moderate distress due to headache HENT: [place]/AT, PERRLA, no scleral icterus Mouth/Throat: Oropharynx is clear and moist. No oropharyngeal exudate.  Cardiovascular: Normal rate, regular rhythm and normal heart sounds. Exam reveals no gallop and no friction rub.  No murmur heard.  Pulmonary/Chest: Effort normal and breath sounds normal. No respiratory distress.  has no wheezes.  Neck = supple, no nuchal rigidity but limited range of motion side to side Abdominal: Soft. Bowel sounds are normal.  exhibits no distension. There is no tenderness.  Lymphadenopathy: no cervical adenopathy. No axillary adenopathy Neurological: alert and  oriented to person, place, and time.  Skin: Skin is warm and dry. No rash noted. No erythema.  Psychiatric: a normal mood and affect.  behavior is normal.    LABS: Results for orders placed or performed during the hospital encounter of 06/25/19 (from the past 48 hour(s))  Comprehensive metabolic panel     Status: Abnormal   Collection Time: 06/25/19 10:31 PM  Result Value Ref Range   Sodium 132 (L) 135 - 145 mmol/L   Potassium 4.0 3.5 - 5.1 mmol/L   Chloride 101 98 - 111 mmol/L   CO2 23 22 - 32 mmol/L   Glucose, Bld 135 (H) 70 - 99 mg/dL    Comment: Glucose  reference range applies only to samples taken after fasting for at least 8 hours.   BUN 6 6 - 20 mg/dL   Creatinine, Ser 2.67 0.44 - 1.00 mg/dL   Calcium 8.3 (L) 8.9 - 10.3 mg/dL   Total Protein 7.3 6.5 - 8.1 g/dL   Albumin 3.7 3.5 - 5.0 g/dL   AST 24 15 - 41 U/L   ALT 24 0 - 44 U/L   Alkaline Phosphatase 56 38 - 126 U/L   Total Bilirubin 0.8 0.3 - 1.2 mg/dL   GFR calc non Af Amer >60 >60 mL/min   GFR calc Af Amer >60 >60 mL/min   Anion gap 8 5 - 15    Comment: Performed at [facility], [address], [place], [place] [address]  CBC with Differential     Status: Abnormal   Collection Time: 06/25/19 10:31 PM  Result Value Ref Range   WBC 11.2 (H) 4.0 - 10.5 K/uL   RBC 3.79 (L) 3.87 - 5.11 MIL/uL   Hemoglobin 11.7 (L) 12.0 - 15.0 g/dL   HCT 09.9 83.3 - 82.5 %   MCV 96.3 80.0 - 100.0 fL   MCH 30.9 26.0 - 34.0 pg   MCHC 32.1 30.0 - 36.0 g/dL   RDW 05.3 97.6 - 73.4 %   Platelets 309 150 - 400 K/uL   nRBC 0.0 0.0 - 0.2 %   Neutrophils Relative % 81 %   Neutro Abs 9.0 (H) 1.7 - 7.7 K/uL   Lymphocytes Relative 9 %   Lymphs Abs 1.0 0.7 - 4.0 K/uL   Monocytes Relative 10 %   Monocytes Absolute 1.1 (H) 0.1 - 1.0 K/uL   Eosinophils Relative 0 %   Eosinophils Absolute 0.0 0.0 - 0.5 K/uL   Basophils Relative 0 %   Basophils Absolute 0.0 0.0 - 0.1 K/uL   Immature Granulocytes 0 %   Abs Immature Granulocytes 0.05 0.00 - 0.07 K/uL    Comment: Performed at [facility], [address], [place], [place] [address]  Protime-INR     Status: None   Collection Time: 06/25/19 10:31 PM  Result Value Ref Range   Prothrombin Time 13.3 11.4 - 15.2 seconds   INR 1.0 0.8 - 1.2    Comment: (NOTE) INR goal varies based on device and disease states. Performed at [facility], [address], [place], [place] [address]   Culture, blood (Routine x 2)     Status: None (Preliminary result)   Collection Time: 06/25/19 10:31 PM   Specimen: BLOOD    Result Value Ref Range   Specimen Description      BLOOD RIGHT HAND Performed at [facility], [address] [place], [place] [address]    Special Requests  BOTTLES DRAWN AEROBIC AND ANAEROBIC Blood Culture results may not be optimal due to an inadequate volume of blood received in culture bottles Performed at [facility], [address] [place], [place] [address]    Culture      NO GROWTH 1 DAY Performed at [facility], [address] [place], [place] [address]    Report Status PENDING   Culture, blood (Routine x 2)     Status: None (Preliminary result)   Collection Time: 06/25/19 10:36 PM   Specimen: BLOOD RIGHT FOREARM  Result Value Ref Range   Specimen Description      BLOOD RIGHT FOREARM Performed at [facility], [address] [place], [place] [address]    Special Requests      BOTTLES DRAWN AEROBIC AND ANAEROBIC Blood Culture adequate volume Performed at [facility], [address] [place], [place] [address]    Culture      NO GROWTH 1 DAY Performed at [facility], [address] [place], [place] [address]    Report Status PENDING   Lactic acid, plasma     Status: None   Collection Time: 06/26/19 12:31 AM  Result Value Ref Range   Lactic Acid, Venous 0.8 0.5 - 1.9 mmol/L    Comment: Performed at [facility], [address] [place], [place] [address]  Urinalysis, Routine w reflex microscopic     Status: Abnormal   Collection Time: 06/26/19 12:55 AM  Result Value Ref Range   Color, Urine YELLOW YELLOW   APPearance CLEAR CLEAR   Specific Gravity, Urine 1.009 1.005 - 1.030   pH 6.0 5.0 - 8.0   Glucose, UA NEGATIVE NEGATIVE mg/dL   Hgb urine dipstick MODERATE (A) NEGATIVE   Bilirubin Urine NEGATIVE NEGATIVE   Ketones, ur NEGATIVE NEGATIVE mg/dL   Protein, ur 30 (A) NEGATIVE mg/dL   Nitrite NEGATIVE NEGATIVE   Leukocytes,Ua NEGATIVE NEGATIVE    RBC / HPF >50 (H) 0 - 5 RBC/hpf   WBC, UA 6-10 0 - 5 WBC/hpf   Bacteria, UA RARE (A) NONE SEEN   Squamous Epithelial / LPF 0-5 0 - 5    Comment: Performed at [facility], [address] [place], [place] [address]  Urine culture     Status: Abnormal   Collection Time: 06/26/19 12:55 AM   Specimen: Urine, Random  Result Value Ref Range   Specimen Description      URINE, RANDOM Performed at [facility], [address] [place], [place] [address]    Special Requests      NONE Performed at [facility], [address] [place], [place] [address]    Culture (A)     <10,000 COLONIES/mL INSIGNIFICANT GROWTH Performed at [facility], [address] [place], [place] [address]    Report Status 06/27/2019 FINAL   I-Stat beta hCG blood, ED     Status: Abnormal   Collection Time: 06/26/19  1:11 AM  Result Value Ref Range   I-stat hCG, quantitative 6.3 (H) <5 mIU/mL   Comment 3            Comment:   GEST. AGE      CONC.  (mIU/mL)   <=1 WEEK        5 - 50     2 WEEKS       50 - 500     3 WEEKS  100 - 10,000     4 WEEKS     1,000 - 30,000        FEMALE AND NON-PREGNANT FEMALE:     LESS THAN 5 mIU/mL   I-stat chem 8, ED (not at [facility] or [facility])     Status: Abnormal   Collection Time: 06/26/19  1:13 AM  Result Value Ref Range   Sodium 131 (L) 135 - 145 mmol/L   Potassium 3.9 3.5 - 5.1 mmol/L   Chloride 100 98 - 111 mmol/L   BUN 5 (L) 6 - 20 mg/dL   Creatinine, Ser 1.61 0.44 - 1.00 mg/dL   Glucose, Bld 096 (H) 70 - 99 mg/dL    Comment: Glucose reference range applies only to samples taken after fasting for at least 8 hours.   Calcium, Ion 1.12 (L) 1.15 - 1.40 mmol/L   TCO2 28 22 - 32 mmol/L   Hemoglobin 11.9 (L) 12.0 - 15.0 g/dL   HCT 04.5 (L) 40.9 - 81.1 %  Respiratory Panel by RT PCR (Flu A&B, Covid) - Nasopharyngeal Swab     Status: None   Collection Time: 06/26/19  2:58 AM   Specimen: Nasopharyngeal Swab   Result Value Ref Range   SARS Coronavirus 2 by RT PCR NEGATIVE NEGATIVE    Comment: (NOTE) SARS-CoV-2 target nucleic acids are NOT DETECTED. The SARS-CoV-2 RNA is generally detectable in upper respiratoy specimens during the acute phase of infection. The lowest concentration of SARS-CoV-2 viral copies this assay can detect is 131 copies/mL. A negative result does not preclude SARS-Cov-2 infection and should not be used as the sole basis for treatment or other patient management decisions. A negative result may occur with  improper specimen collection/handling, submission of specimen other than nasopharyngeal swab, presence of viral mutation(s) within the areas targeted by this assay, and inadequate number of viral copies (<131 copies/mL). A negative result must be combined with clinical observations, patient history, and epidemiological information. The expected result is Negative. Fact Sheet for Patients:  https://www.moore.com/ Fact Sheet for Healthcare Providers:  https://www.young.biz/ This test is not yet ap proved or cleared by the [place] FDA and  has been authorized for detection and/or diagnosis of SARS-CoV-2 by FDA under an Emergency Use Authorization (EUA). This EUA will remain  in effect (meaning this test can be used) for the duration of the COVID-19 declaration under Section 564(b)(1) of the Act, 21 U.S.C. section 360bbb-3(b)(1), unless the authorization is terminated or revoked sooner.    Influenza A by PCR NEGATIVE NEGATIVE   Influenza B by PCR NEGATIVE NEGATIVE    Comment: (NOTE) The Xpert Xpress SARS-CoV-2/FLU/RSV assay is intended as an aid in  the diagnosis of influenza from Nasopharyngeal swab specimens and  should not be used as a sole basis for treatment. Nasal washings and  aspirates are unacceptable for Xpert Xpress SARS-CoV-2/FLU/RSV  testing. Fact Sheet for  Patients: https://www.moore.com/ Fact Sheet for Healthcare Providers: https://www.young.biz/ This test is not yet approved or cleared by the [place] FDA and  has been authorized for detection and/or diagnosis of SARS-CoV-2 by  FDA under an Emergency Use Authorization (EUA). This EUA will remain  in effect (meaning this test can be used) for the duration of the  Covid-19 declaration under Section 564(b)(1) of the Act, 21  U.S.C. section 360bbb-3(b)(1), unless the authorization is  terminated or revoked. Performed at [facility], [address], [place], [place] [address]   CBC     Status: Abnormal  Collection Time: 06/27/19  6:56 AM  Result Value Ref Range   WBC 25.2 (H) 4.0 - 10.5 K/uL   RBC 3.10 (L) 3.87 - 5.11 MIL/uL   Hemoglobin 9.7 (L) 12.0 - 15.0 g/dL   HCT 07.6 (L) 22.6 - 33.3 %   MCV 101.3 (H) 80.0 - 100.0 fL   MCH 31.3 26.0 - 34.0 pg   MCHC 30.9 30.0 - 36.0 g/dL   RDW 54.5 62.5 - 63.8 %   Platelets 244 150 - 400 K/uL   nRBC 0.0 0.0 - 0.2 %    Comment: Performed at [facility], [address] [place], [place] [address]  Comprehensive metabolic panel     Status: Abnormal   Collection Time: 06/27/19  6:56 AM  Result Value Ref Range   Sodium 140 135 - 145 mmol/L    Comment: DELTA CHECK NOTED   Potassium 3.5 3.5 - 5.1 mmol/L   Chloride 114 (H) 98 - 111 mmol/L   CO2 18 (L) 22 - 32 mmol/L   Glucose, Bld 117 (H) 70 - 99 mg/dL    Comment: Glucose reference range applies only to samples taken after fasting for at least 8 hours.   BUN 8 6 - 20 mg/dL   Creatinine, Ser 2.87 0.44 - 1.00 mg/dL   Calcium 7.3 (L) 8.9 - 10.3 mg/dL   Total Protein 6.0 (L) 6.5 - 8.1 g/dL   Albumin 2.7 (L) 3.5 - 5.0 g/dL   AST 24 15 - 41 U/L   ALT 32 0 - 44 U/L   Alkaline Phosphatase 53 38 - 126 U/L   Total Bilirubin 0.8 0.3 - 1.2 mg/dL   GFR calc non Af Amer >60 >60 mL/min   GFR calc Af Amer >60 >60 mL/min    Anion gap 8 5 - 15    Comment: Performed at [facility], [address], [place], [place] [address]  Magnesium     Status: None   Collection Time: 06/27/19  6:56 AM  Result Value Ref Range   Magnesium 2.0 1.7 - 2.4 mg/dL    Comment: Performed at [facility], [address], [place], [place] [address]  C-reactive protein     Status: Abnormal   Collection Time: 06/27/19  6:56 AM  Result Value Ref Range   CRP 26.4 (H) <1.0 mg/dL    Comment: Performed at [facility], [address] [place], [place] [address]    MICRO: reviewed IMAGING: DG Chest 2 View  Result Date: 06/25/2019 CLINICAL DATA:  39 year old female with suspected sepsis. EXAM: CHEST - 2 VIEW COMPARISON:  Chest radiograph dated 03/23/2019. FINDINGS: The heart size and mediastinal contours are within normal limits. Both lungs are clear. The visualized skeletal structures are unremarkable. IMPRESSION: No active cardiopulmonary disease. Electronically Signed   By: Elgie Collard M.D.   On: 06/25/2019 22:48   MR BRAIN W WO CONTRAST  Result Date: 06/27/2019 CLINICAL DATA:  Chronic headache.  History of meningitis in January. EXAM: MRI HEAD WITHOUT AND WITH CONTRAST TECHNIQUE: Multiplanar, multiecho pulse sequences of the brain and surrounding structures were obtained without and with intravenous contrast. CONTRAST:  14mL GADAVIST GADOBUTROL 1 MMOL/ML IV SOLN COMPARISON:  04/27/2019 FINDINGS: Brain: The brain has a normal appearance without evidence of malformation, atrophy, old or acute small or large vessel infarction, mass lesion, hemorrhage, hydrocephalus or extra-axial collection. After contrast administration, no abnormal enhancement occurs. Vascular: Major vessels at the base of the brain show flow.  Venous sinuses appear patent. Skull and upper cervical spine: Normal. Sinuses/Orbits: Clear/normal. Other: None significant. IMPRESSION: Normal examination. No evidence of active  meningitis. No old insult evident by MRI. Electronically Signed   By: Paulina Fusi M.D.   On: 06/27/2019 14:14   CT ABDOMEN PELVIS W CONTRAST  Result Date: 06/26/2019 CLINICAL DATA:  39 year old female with history of lithotripsy on Thursday. Postprocedural pain and fever. EXAM: CT ABDOMEN AND PELVIS WITH CONTRAST TECHNIQUE: Multidetector CT imaging of the abdomen and pelvis was performed using the standard protocol following bolus administration of intravenous contrast. CONTRAST:  OMNIPAQUE IOHEXOL 300 MG/ML  SOLN COMPARISON:  CT the abdomen and pelvis 06/18/2019. FINDINGS: Lower chest: Trace volume of right pleural fluid and minimal subsegmental atelectasis in the base of the right lower lobe. Hepatobiliary: 1.3 x 0.8 cm low-attenuation area in segment 6 of the liver (axial image 46 of series 6). No other suspicious hepatic lesions. No intra or extrahepatic biliary ductal dilatation. Gallbladder is normal in appearance. Pancreas: No pancreatic mass. No pancreatic ductal dilatation. No pancreatic or peripancreatic fluid collections or inflammatory changes. Spleen: Unremarkable. Adrenals/Urinary Tract: Left kidney and bilateral adrenal glands are normal in appearance. Previously noted calculus in the proximal third of the right ureter is not identified. No calculus fragments are identified along the course of the right ureter or within the lumen of the urinary bladder. Mild hypoenhancement of the right kidney when compared to the contralateral side, which may reflect some resolving parenchymal edema in the setting of recent lithotripsy. Trace amount of perinephric fluid and soft tissue stranding, presumably related to recent lithotripsy. No well-defined perinephric fluid collection. Specifically, no evidence of subcapsular hematoma. Urinary bladder is normal in appearance. Stomach/Bowel: Normal appearance of the stomach. No pathologic dilatation of small bowel or colon. Normal appendix. Vascular/Lymphatic:  No significant atherosclerotic disease, aneurysm or dissection noted in the abdominal or pelvic vasculature. No lymphadenopathy noted in the abdomen or pelvis. Reproductive: Uterus and ovaries are unremarkable in appearance. Other: No significant volume of ascites.  No pneumoperitoneum. Musculoskeletal: There are no aggressive appearing lytic or blastic lesions noted in the visualized portions of the skeleton. IMPRESSION: 1. Postprocedural changes of recent lithotripsy with trace amount of right perinephric fluid and soft tissue stranding, mild edema in the right kidney, and what appears to be a very small contusion of the inferior aspect of segment 6 of the liver adjacent to the inferior pole of the right kidney. No subcapsular hematoma or other significant acute complicating features. Previously noted right proximal ureteral calculus is no longer identified. No calculus fragments along the course of the right kidney or within the lumen of the urinary bladder. 2. Trace right pleural effusion with minimal subsegmental atelectasis in the dependent right lower lobe. Electronically Signed   By: Trudie Reed M.D.   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

## 2019-06-28 ENCOUNTER — Inpatient Hospital Stay (HOSPITAL_COMMUNITY): Payer: 59

## 2019-06-28 DIAGNOSIS — R519 Headache, unspecified: Secondary | ICD-10-CM

## 2019-06-28 DIAGNOSIS — E039 Hypothyroidism, unspecified: Secondary | ICD-10-CM | POA: Diagnosis not present

## 2019-06-28 DIAGNOSIS — R739 Hyperglycemia, unspecified: Secondary | ICD-10-CM

## 2019-06-28 DIAGNOSIS — A419 Sepsis, unspecified organism: Secondary | ICD-10-CM | POA: Diagnosis not present

## 2019-06-28 DIAGNOSIS — R509 Fever, unspecified: Secondary | ICD-10-CM

## 2019-06-28 DIAGNOSIS — D72829 Elevated white blood cell count, unspecified: Secondary | ICD-10-CM | POA: Diagnosis not present

## 2019-06-28 LAB — ECHOCARDIOGRAM COMPLETE
Height: 66 in
Weight: 2400 oz

## 2019-06-28 LAB — CBC WITH DIFFERENTIAL/PLATELET
Abs Immature Granulocytes: 0.16 10*3/uL — ABNORMAL HIGH (ref 0.00–0.07)
Basophils Absolute: 0 10*3/uL (ref 0.0–0.1)
Basophils Relative: 0 %
Eosinophils Absolute: 0.1 10*3/uL (ref 0.0–0.5)
Eosinophils Relative: 1 %
HCT: 28.7 % — ABNORMAL LOW (ref 36.0–46.0)
Hemoglobin: 9.2 g/dL — ABNORMAL LOW (ref 12.0–15.0)
Immature Granulocytes: 1 %
Lymphocytes Relative: 6 %
Lymphs Abs: 1 10*3/uL (ref 0.7–4.0)
MCH: 31.2 pg (ref 26.0–34.0)
MCHC: 32.1 g/dL (ref 30.0–36.0)
MCV: 97.3 fL (ref 80.0–100.0)
Monocytes Absolute: 1 10*3/uL (ref 0.1–1.0)
Monocytes Relative: 6 %
Neutro Abs: 14.2 10*3/uL — ABNORMAL HIGH (ref 1.7–7.7)
Neutrophils Relative %: 86 %
Platelets: 235 10*3/uL (ref 150–400)
RBC: 2.95 MIL/uL — ABNORMAL LOW (ref 3.87–5.11)
RDW: 13.3 % (ref 11.5–15.5)
WBC: 16.4 10*3/uL — ABNORMAL HIGH (ref 4.0–10.5)
nRBC: 0 % (ref 0.0–0.2)

## 2019-06-28 LAB — RETICULOCYTES
Immature Retic Fract: 4.4 % (ref 2.3–15.9)
RBC.: 2.97 MIL/uL — ABNORMAL LOW (ref 3.87–5.11)
Retic Count, Absolute: 36.2 10*3/uL (ref 19.0–186.0)
Retic Ct Pct: 1.2 % (ref 0.4–3.1)

## 2019-06-28 LAB — COMPREHENSIVE METABOLIC PANEL
ALT: 28 U/L (ref 0–44)
AST: 19 U/L (ref 15–41)
Albumin: 2.3 g/dL — ABNORMAL LOW (ref 3.5–5.0)
Alkaline Phosphatase: 55 U/L (ref 38–126)
Anion gap: 7 (ref 5–15)
BUN: 7 mg/dL (ref 6–20)
CO2: 24 mmol/L (ref 22–32)
Calcium: 7.8 mg/dL — ABNORMAL LOW (ref 8.9–10.3)
Chloride: 110 mmol/L (ref 98–111)
Creatinine, Ser: 0.49 mg/dL (ref 0.44–1.00)
GFR calc Af Amer: >60 mL/min (ref >60)
GFR calc non Af Amer: >60 mL/min (ref >60)
Glucose, Bld: 148 mg/dL — ABNORMAL HIGH (ref 70–99)
Potassium: 4 mmol/L (ref 3.5–5.1)
Sodium: 141 mmol/L (ref 135–145)
Total Bilirubin: 0.6 mg/dL (ref 0.3–1.2)
Total Protein: 5 g/dL — ABNORMAL LOW (ref 6.5–8.1)

## 2019-06-28 LAB — C-REACTIVE PROTEIN: CRP: 26.3 mg/dL — ABNORMAL HIGH (ref <1.0)

## 2019-06-28 LAB — FOLATE: Folate: 9.5 ng/mL (ref >5.9)

## 2019-06-28 LAB — IRON AND TIBC
Iron: 9 ug/dL — ABNORMAL LOW (ref 28–170)
Saturation Ratios: 4 % — ABNORMAL LOW (ref 10.4–31.8)
TIBC: 231 ug/dL — ABNORMAL LOW (ref 250–450)
UIBC: 222 ug/dL

## 2019-06-28 LAB — MAGNESIUM: Magnesium: 1.8 mg/dL (ref 1.7–2.4)

## 2019-06-28 LAB — TSH: TSH: 2.607 u[IU]/mL (ref 0.350–4.500)

## 2019-06-28 LAB — VITAMIN B12: Vitamin B-12: 1355 pg/mL — ABNORMAL HIGH (ref 180–914)

## 2019-06-28 LAB — FERRITIN: Ferritin: 133 ng/mL (ref 11–307)

## 2019-06-28 LAB — PHOSPHORUS: Phosphorus: 2.2 mg/dL — ABNORMAL LOW (ref 2.5–4.6)

## 2019-06-28 LAB — SEDIMENTATION RATE: Sed Rate: 76 mm/hr — ABNORMAL HIGH (ref 0–22)

## 2019-06-28 MED ORDER — BUTALBITAL-APAP-CAFFEINE 50-325-40 MG PO TABS
1.0000 | ORAL_TABLET | Freq: Four times a day (QID) | ORAL | Status: DC | PRN
  Administered 2019-06-28 – 2019-07-01 (×6): 1 via ORAL
  Filled 2019-06-28 (×8): qty 1

## 2019-06-28 MED ORDER — ACETAMINOPHEN 325 MG PO TABS
650.0000 mg | ORAL_TABLET | Freq: Four times a day (QID) | ORAL | Status: DC | PRN
  Filled 2019-06-28: qty 2

## 2019-06-28 MED ORDER — SODIUM CHLORIDE 0.9 % IV SOLN: INTRAVENOUS | Status: AC

## 2019-06-28 MED ORDER — DIPHENHYDRAMINE HCL 50 MG/ML IJ SOLN
25.0000 mg | Freq: Once | INTRAMUSCULAR | Status: AC
  Administered 2019-06-28: 25 mg via INTRAVENOUS
  Filled 2019-06-28: qty 1

## 2019-06-28 MED ORDER — PROCHLORPERAZINE EDISYLATE 10 MG/2ML IJ SOLN
10.0000 mg | Freq: Once | INTRAMUSCULAR | Status: AC
  Administered 2019-06-28: 10 mg via INTRAVENOUS
  Filled 2019-06-28: qty 2

## 2019-06-28 MED ORDER — K PHOS MONO-SOD PHOS DI & MONO 155-852-130 MG PO TABS
500.0000 mg | ORAL_TABLET | Freq: Two times a day (BID) | ORAL | Status: AC
  Administered 2019-06-28 (×2): 500 mg via ORAL
  Filled 2019-06-28 (×2): qty 2

## 2019-06-28 NOTE — Progress Notes (Addendum)
[facility]    Date of Admission:  06/25/2019   Total days of antibiotics 4          ID: Megan Lara is a 39 y.o. female admitted with fever/chills post urologic procedure but has intratable headache concern for septic meningitis Principal Problem:   Fever Active Problems:   Hypothyroidism   Leukocytosis   Sepsis ([place])    Subjective: Still having significant headache, to occiput radiating towards apex of head to eyes, photophobia, nausea. Still some flank pain  Underwent LP that showed WNL. Gram stain negative. Wbc in csf of 4/glu62/protein 30  Medications:  . busPIRone  5 mg Oral BID  . diphenhydrAMINE  25 mg Intravenous Once  . gabapentin  100 mg Oral QHS  . levothyroxine  75 mcg Oral Q0600  . loratadine  10 mg Oral QHS  . phosphorus  500 mg Oral BID  . prochlorperazine  10 mg Intravenous Once  . sertraline  100 mg Oral QHS    Objective: Vital signs in last 24 hours: Temp:  [97.7 F (36.5 C)-100.1 F (37.8 C)] 100.1 F (37.8 C) (04/26 1708) Pulse Rate:  [54-87] 80 (04/26 1708) Resp:  [16-18] 16 (04/26 1202) BP: (103-186)/(66-87) 123/74 (04/26 1708) SpO2:  [94 %-100 %] 96 % (04/26 1708) Physical Exam  Constitutional:  oriented to person, place, and time. appears well-developed and well-nourished. In discomfort holding her head  HENT: /AT, PERRLA, no scleral icterus Mouth/Throat: Oropharynx is clear and moist. No oropharyngeal exudate.  Cardiovascular: Normal rate, regular rhythm and normal heart sounds. Exam reveals no gallop and no friction rub.  No murmur heard.  Pulmonary/Chest: Effort normal and breath sounds normal. No respiratory distress.  has no wheezes.  Neck = supple, no nuchal rigidity Abdominal: Soft. Bowel sounds are normal.  exhibits no distension. There is no tenderness.  Lymphadenopathy: no cervical adenopathy. No axillary adenopathy Neurological: alert and oriented to person, place, and time.  Skin:  Skin is warm and dry. No rash noted. No erythema.  Psychiatric: tearful   Lab Results Recent Labs    06/27/19 0656 06/28/19 0548  WBC 25.2* 16.4*  HGB 9.7* 9.2*  HCT 31.4* 28.7*  NA 140 141  K 3.5 4.0  CL 114* 110  CO2 18* 24  BUN 8 7  CREATININE 0.70 0.49   Liver Panel Recent Labs    06/27/19 0656 06/28/19 0548  PROT 6.0* 5.0*  ALBUMIN 2.7* 2.3*  AST 24 19  ALT 32 28  ALKPHOS 53 55  BILITOT 0.8 0.6   Sedimentation Rate Recent Labs    06/28/19 0548  ESRSEDRATE 76*   C-Reactive Protein Recent Labs    06/27/19 0656 06/28/19 0548  CRP 26.4* 26.3*    Microbiology: reviewed Studies/Results: MR BRAIN W WO CONTRAST  Result Date: 06/27/2019 CLINICAL DATA:  Chronic headache.  History of meningitis in January. EXAM: MRI HEAD WITHOUT AND WITH CONTRAST TECHNIQUE: Multiplanar, multiecho pulse sequences of the brain and surrounding structures were obtained without and with intravenous contrast. CONTRAST:  80mL GADAVIST GADOBUTROL 1 MMOL/ML IV SOLN COMPARISON:  04/27/2019 FINDINGS: Brain: The brain has a normal appearance without evidence of malformation, atrophy, old or acute small or large vessel infarction, mass lesion, hemorrhage, hydrocephalus or extra-axial collection. After contrast administration, no abnormal enhancement occurs. Vascular: Major vessels at the base of the brain show flow. Venous sinuses appear patent. Skull and upper cervical spine: Normal. Sinuses/Orbits: Clear/normal. Other: None significant. IMPRESSION: Normal examination. No evidence  of active meningitis. No old insult evident by MRI. Electronically Signed   By: Paulina Fusi M.D.   On: 06/27/2019 14:14   ECHOCARDIOGRAM COMPLETE  Result Date: 06/28/2019    ECHOCARDIOGRAM REPORT   Patient Name:   Megan Lara Date of Exam: 06/28/2019 Medical Rec #:  [id number]               Height:       66.0 in Accession #:    [id number]              Weight:       150.0 lb Date of Birth:  August 13, 1980                BSA:          1.770 m Patient Age:    38 years                BP:           109/71 mmHg Patient Gender: F                       HR:           77 bpm. Exam Location:  Inpatient Procedure: 2D Echo Indications:   Fever 780.6 / R50.9  History:       Patient has no prior history of Echocardiogram examinations.                Sepsis.  Sonographer:   Leeroy Bock Turrentine Referring      [id number] Kateri Mc LATIF [facility] Phys: IMPRESSIONS  1. Left ventricular ejection fraction, by estimation, is 60 to 65%. The left ventricle has normal function. The left ventricle has no regional wall motion abnormalities. Left ventricular diastolic parameters were normal.  2. Right ventricular systolic function is normal. The right ventricular size is normal. Tricuspid regurgitation signal is inadequate for assessing PA pressure.  3. The mitral valve is normal in structure. No evidence of mitral valve regurgitation. No evidence of mitral stenosis.  4. The aortic valve is normal in structure. Aortic valve regurgitation is not visualized. No aortic stenosis is present.  5. Cannot exclude small independently mobile structure on pulmonary valve seen best in clip 19. Trivial pulmonary valve regurgitation, no pulmonary valve stenosis.  6. The inferior vena cava is normal in size with <50% respiratory variability, suggesting right atrial pressure of 8 mmHg. Conclusion(s)/Recommendation(s): No definite evidence of valvular vegetations on this transthoracic echocardiogram. Would recommend a transesophageal echocardiogram to exclude infective endocarditis if clinically indicated. FINDINGS  Left Ventricle: Left ventricular ejection fraction, by estimation, is 60 to 65%. The left ventricle has normal function. The left ventricle has no regional wall motion abnormalities. The left ventricular internal cavity size was normal in size. There is  no left ventricular hypertrophy. Left ventricular diastolic parameters were normal. Right Ventricle: The right  ventricular size is normal. No increase in right ventricular wall thickness. Right ventricular systolic function is normal. Tricuspid regurgitation signal is inadequate for assessing PA pressure. Left Atrium: Left atrial size was normal in size. Right Atrium: Right atrial size was normal in size. Pericardium: Trivial pericardial effusion is present. Mitral Valve: The mitral valve is normal in structure. Normal mobility of the mitral valve leaflets. No evidence of mitral valve regurgitation. No evidence of mitral valve stenosis. Tricuspid Valve: The tricuspid valve is normal in structure. Tricuspid valve regurgitation is not demonstrated. No evidence of tricuspid stenosis. Aortic Valve: The aortic valve is normal  in structure. Aortic valve regurgitation is not visualized. No aortic stenosis is present. Pulmonic Valve: Cannot exclude small independently mobile structure on pulmonary valve seen best in clip 19. Trivial pulmonary valve regurgitation, no pulmonary valve stenosis. The pulmonic valve was normal in structure. Pulmonic valve regurgitation is trivial. No evidence of pulmonic stenosis. Aorta: The aortic root is normal in size and structure. Venous: The inferior vena cava is normal in size with less than 50% respiratory variability, suggesting right atrial pressure of 8 mmHg. IAS/Shunts: No atrial level shunt detected by color flow Doppler.  LEFT VENTRICLE PLAX 2D LVIDd:         4.50 cm  Diastology LVIDs:         2.74 cm  LV e' lateral:   19.70 cm/s LV PW:         0.90 cm  LV E/e' lateral: 6.7 LV IVS:        0.88 cm  LV e' medial:    13.20 cm/s LVOT diam:     1.80 cm  LV E/e' medial:  10.0 LV SV:         67 LV SV Index:   38 LVOT Area:     2.54 cm  RIGHT VENTRICLE RV S prime:     15.20 cm/s TAPSE (M-mode): 2.6 cm LEFT ATRIUM           Index       RIGHT ATRIUM           Index LA diam:      3.70 cm 2.09 cm/m  RA Area:     16.80 cm LA Vol (A4C): 64.5 ml 36.45 ml/m RA Volume:   42.50 ml  24.02 ml/m  AORTIC  VALVE LVOT Vmax:   135.00 cm/s LVOT Vmean:  103.000 cm/s LVOT VTI:    0.264 m  AORTA Ao Root diam: 2.40 cm MITRAL VALVE MV Area (PHT): 5.23 cm     SHUNTS MV Decel Time: 145 msec     Systemic VTI:  0.26 m MV E velocity: 132.00 cm/s  Systemic Diam: 1.80 cm MV A velocity: 76.00 cm/s MV E/A ratio:  1.74 Weston Brass MD Electronically signed by Weston Brass MD Signature Date/Time: 06/28/2019/2:24:01 PM    Final    DG FLUORO GUIDE LUMBAR PUNCTURE  Result Date: 06/27/2019 CLINICAL DATA:  Fever, headache and history of meningitis. EXAM: DIAGNOSTIC LUMBAR PUNCTURE UNDER FLUOROSCOPIC GUIDANCE FLUOROSCOPY TIME:  Fluoroscopy Time:  0 minutes and 48 seconds Radiation Exposure Index (if provided by the fluoroscopic device): 5 mGy Number of Acquired Spot Images: 0 PROCEDURE: Informed consent was obtained from the patient prior to the procedure, including potential complications of headache, allergy, and pain. With the patient prone, the lower back was prepped with Betadine. 1% Lidocaine was used for local anesthesia. Lumbar puncture was performed at the L3-4 level using a 20 gauge needle with return of clear CSF with an opening pressure of 34 cm H2O. 13 ml of CSF were obtained for laboratory studies. Closing pressure was 21 cm H2O. The patient tolerated the procedure well and there were no apparent complications. IMPRESSION: Fluoroscopic guided lumbar puncture at L3-4 with clear CSF obtained for appropriate laboratory evaluation. High opening pressure of 34 cm H2O. Electronically Signed   By: Rudie Meyer M.D.   On: 06/27/2019 15:43     Assessment/Plan: Fevers post urologic procedure = will continue on meropenem for now. 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 Will d/c iv vancomycin. In mid April her urine culture grew 70,000  colonies of MSSA which would also be covered by meropenem. Blood cx NGTD. Will follow up on cultures  Leukocytosis = improving.  Intractable headache/migraine = concern that ibuprofen/tylenol  contributing to nsaid related headache. Will stop these agents and give dose of IV benadryl and IV compazine to see if any relief. If this does not work, recommend valproic acid 1gm IV load. 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   [facility] Cell: [phone] Pager: [phone]  06/28/2019, 6:37 PM

## 2019-06-28 NOTE — Progress Notes (Signed)
Pt continues to report 9/10 head pain unrelieved by PRN pain meds. MD notified.

## 2019-06-28 NOTE — Progress Notes (Signed)
PROGRESS NOTE    Megan Lara  FVC:[id number] DOB: 03-Mar-1981 DOA: 06/25/2019 PCP: Jonathon Jordan, MD   Brief Narrative:  HPI per Dr. Aline August on 06/26/19 Megan Lara is a 39 y.o. female with medical history significant of Asthma,  Hypothyroidism, depression, right ureteral stone status post extracorporeal shockwave lithotripsy on 06/24/2019 by Dr. Ottelin/urology presented with fever since yesterday.  Patient is having high-grade fever with chills since yesterday but denies worsening cough, shortness of breath, chest pain, vomiting, abdominal pain, diarrhea.  She denies any increased frequency of urination or dysuria but complains of some flank pain along with nausea.  She has very poor appetite.  Denies loss of consciousness, seizures, rash.  Has not had her Covid vaccine yet.  ED Course: Patient was found to be febrile, tachycardic with mild leukocytosis.  She was started on IV fluids and antibiotics.  CT of the abdomen and pelvis with contrast showed postprocedural changes of recent lithotripsy with trace amount of right perinephric fluid and soft tissue stranding but no obstructing stone.  Chest x-ray was negative for infiltrates.  **Interim History She thought to have a urinary tract infection however based on examination today she appears to have meningitis given her severe headache, photophobia as well as neck pain and mild stiffness.  She did spike a temperature of 102.4 yesterday and she has no other focalizing symptoms of infection denies any shortness of breath, nausea or vomiting.  Will obtain MRI of the brain with and without contrast as well as an LP to further delineate and start the patient on IV ceftriaxone, IV vancomycin and IV acyclovir  MRI of the brain showed a normal examination and no evidence of any active meningitis and no old insult evident by MRI.  The LP was done and only had 4 WBCs in the CSF and did not appear infected but CSF culture and  Gram stain is still pending along with fungus culture.  Patient's inflammatory marker still remains significantly elevated with a CRP of 26.3 and a ESR of 79.  The WBC is slowly trending down.  Will obtain echocardiogram to rule out any pathology on the heart that may be causing her fevers.  She did spike a temperature last night but is improved today but continues to complain of headache.  Assessment & Plan:   Principal Problem:   Fever Active Problems:   Hypothyroidism   Leukocytosis   Sepsis ([place])  Sepsis present on admission initially thought to be from a complicated UTI but now likely secondary to ?Meningioencephalitis vs. Other Etiology  Leukocytosis, improving  -Presented with a temperature 102.4, pulse rate of 144, respiratory rate of 31 and hypotension as well as a leukocytosis and now likely a source of infection and the meninges; she continued to spike a temperature and had TMax of 101.4 yesterday evening but is improved today and WBC is trending down -Continues to complain of significant headache -Initially thought to be from complicated UTI in a patient with right ureteral stone status post recent extracorporeal shockwave lithotripsy on 06/24/2019 however she is not have any urinary symptoms and urinalysis was not very revealing as it showed a clear appearance with moderate hemoglobin but rare bacteria and greater than 50 RBCs per high-power field; also has 0-5, epithelial cells and 6-10 WBCs and urine culture showed less than 10,000 colonies of insignificant growth -Blood cultures x2 showed no growth to date at 2 days -Patient had ESWL on 06/24/2019 by Dr. Ottelin/Urology and presented with fever since  yesterday with tachycardia and mild leukocytosis -She is now complaining of significant headache and she has some neck pain and some stiffness; states that she is admitted in January for meningitis -CT of the abdomen and pelvis showed postprocedural changes of recent lithotripsy with  trace amount of right perinephric fluid and soft tissue stranding, mild edema in the right kidney but no obstructing stone. -We will continue broad-spectrum antibiotics for now including cefepime and vancomycin however we have changed to Augmentin diabetes antibiotics with IV ceftriaxone 2 g, IV vancomycin and have added IV acyclovir -Dr. Starla Link spoke to Dr. Winter/Urology who recommended conservative medical management and no need for current surgical intervention.  Outpatient follow-up with urology -Because of the constellation of her symptoms and her fever of 102.4, headache and neck stiffness we have strong suspicion for meningitis -I spoke to neurology who recommended changing to the cefepime to meropenem for broader coverage and was adding IV acyclovir.  Dr. Rory Percy recommends obtaining an MRI of the brain with and without contrast as well as an LP next-both of these have been requested to be done stat -I also spoke with Dr. Baxter Flattery of the suspected diseases who does recommend continuing antibiotics but de-escalating meropenem to ceftriaxone which would be done now; now the patient is on IV vancomycin, IV ceftriaxone, IV acyclovir; the antibiotics had not been escalated to IV meropenem and IV vancomycin along with IV acyclovir by Dr. Baxter Flattery -Currently has a significant headache so we will continue IV ketorolac every 6 hours as needed for severe pain, ibuprofen 600 mg p.o. every 6 as needed for fevers not responding to Tylenol as well as oxycodone-acetaminophen 1-2 tabs p.o. every 6 hours as needed severe pain; states her headache is still severe today -WBC has acutely worsened and gone from 11.2 -> 25.2 -> 16.4 -Follow-up on lumbar puncture as well as MRI of the brain earlier done -CRP was 26.4 and repeat was 26.3; ESR was 79 -Her SARS-CoV-2 testing was negative and chest x-ray is negative for any acute abnormalities -She is given a 1 L bolus and will continued on normal saline at a rate of 125 mL's per  hour however this was changed to sodium bicarbonate 150 mEq in D5 at 100 mL/h yesterday but will change back to NS at 75 mL/hr for 12 hours  -Continue supportive care and antiemetics with p.o./IV Ondansetron 4 mg every 6 hours as needed nausea -Lactic acid level on admission was 0.8 -Continue to monitor for signs and symptoms of infection and will obtain echocardiogram as the MRI was unrevealing and CSF did not appear to be added as it had a clear appearance with 62 glucose, 2 RBCs, 4 WBCs, too few to count low normal cells, and 30 protein; CSF Gram stain and culture are still pending with fungus culture plated so far -We will await further infectious disease recommendations and appreciate their assistance  Hyponatremia -Patient sodium on admission was 131 and is now improved to 141 -Continued with normal saline at 125 MLS per hour but now changed to IV Sodium Bicarbonate 150 mEQ at 100 mL/hr -continue to monitor and trend and repeat CMP in a.m.  Hypothyroidism -Checked TSH and was 2.607. -Continue Levothyroxine 75 mcg p.o. daily  Asthma -Currently controlled.    Continue with albuterol 2.5 mg IH every 6 as needed for wheezing  Depression  -Continue Sertraline 100 mg p.o. nightly, as well as buspirone 5 mg p.o. twice daily  Metabolic Acidosis,  -Patient's CO2 was 18, chloride level  was 114, and anion gap was 8 -She was given normal saline at 125 mils per hour but we was changed to sodium bicarbonate 150 mEq in D5 at 100 MLS per hour and will now change back to NS at 75 mL/hr x12 hours  -Continue to monitor and repeat CMP in a.m.  Normocytic Anemia now with a transient Macrocytosis/Macrocytic Anemia -Patient's hemoglobin/hematocrit went from 11.7/36.5 on admission is now 9.2/28.7 and likely dilutional drop; MCV is now 97.3 -Checked Anemia Panel and showed iron level of 9, U IBC of 222, TIBC of 231, saturation ratios of 4%, ferritin level 133, folate level 9.5, and vitamin B12 of  1355 -likely will need iron supplementation but will defer at this time until a source of infection is found. -Continue to monitor for signs and symptoms of bleeding; Currently no overt bleeding noted -Repeat CBC in AM   Hypophosphatemia -Patient's phosphorus level this morning was 2.2 -Replete with p.o. K-Phos Neutral 500 mg p.o. twice daily x2 doses -Continue to monitor and replete as necessary -Repeat phosphorus level in the a.m.  Hyperglycemia -Blood sugar was elevated on admission and has been ranging from 117-148 -Check hemoglobin A1c in the a.m. -Continue to monitor blood sugars carefully and if necessary with the sensitive NovoLog sliding scale insulin AC  DVT prophylaxis: SCDs; Will likely add Enoxaparin 40 mg sq 24h now that she has had an LP Code Status: FULL CODE  Family Communication: Discussed with Husband at bedside  Disposition Plan: Patient is from home now presenting with a severe headache and fever with concern for meningitis so she will need to remain inpatient for further work-up and will need an MRI as well as an LP  Status is: Inpatient  Remains inpatient appropriate because:Ongoing diagnostic testing needed not appropriate for outpatient work up, Unsafe d/c plan, IV treatments appropriate due to intensity of illness or inability to take PO and Inpatient level of care appropriate due to severity of illness   Dispo: The patient is from: Home              Anticipated d/c is to: Home              Anticipated d/c date is: 2 days              Patient currently is not medically stable to d/c.  Consultants:   Discussed Case with Neurology Dr. Rory Percy  Infectious Diseases Dr. Carlyle Basques    Procedures:  MRI Brain w/wo Contrast Lumbar Puncture   ECHOCardiogram  Antimicrobials:  Anti-infectives (From admission, onward)   Start     Dose/Rate Route Frequency Ordered Stop   06/28/19 0100  acyclovir (ZOVIRAX) 675 mg in dextrose 5 % 100 mL IVPB     675  mg 113.5 mL/hr over 60 Minutes Intravenous Every 8 hours 06/27/19 2133     06/27/19 1600  meropenem (MERREM) 2 g in sodium chloride 0.9 % 100 mL IVPB     2 g 200 mL/hr over 30 Minutes Intravenous Every 8 hours 06/27/19 1447     06/27/19 1445  meropenem (MERREM) injection 2 g  Status:  Discontinued     2 g Intramuscular Every 8 hours 06/27/19 1442 06/27/19 1446   06/27/19 1400  vancomycin (VANCOREADY) IVPB 750 mg/150 mL     750 mg 150 mL/hr over 60 Minutes Intravenous Every 8 hours 06/27/19 1201     06/27/19 1400  acyclovir (ZOVIRAX) 675 mg in dextrose 5 % 100 mL IVPB  Status:  Discontinued     675 mg 113.5 mL/hr over 60 Minutes Intravenous Every 8 hours 06/27/19 1201 06/27/19 1203   06/27/19 1230  acyclovir (ZOVIRAX) 675 mg in dextrose 5 % 100 mL IVPB  Status:  Discontinued     675 mg 113.5 mL/hr over 60 Minutes Intravenous Every 8 hours 06/27/19 1203 06/27/19 2134   06/27/19 1215  cefTRIAXone (ROCEPHIN) 2 g in sodium chloride 0.9 % 100 mL IVPB  Status:  Discontinued     2 g 200 mL/hr over 30 Minutes Intravenous Every 12 hours 06/27/19 1201 06/27/19 1442   06/27/19 1200  cefTRIAXone (ROCEPHIN) 2 g in sodium chloride 0.9 % 100 mL IVPB  Status:  Discontinued     2 g 200 mL/hr over 30 Minutes Intravenous Every 24 hours 06/27/19 1146 06/27/19 1201   06/26/19 1800  vancomycin (VANCOCIN) IVPB 750 mg/150 ml premix  Status:  Discontinued     750 mg 150 mL/hr over 60 Minutes Intravenous 2 times daily 06/26/19 0853 06/26/19 1310   06/26/19 1800  vancomycin (VANCOCIN) IVPB 750 mg/150 ml premix  Status:  Discontinued     750 mg 150 mL/hr over 60 Minutes Intravenous Every 12 hours 06/26/19 1311 06/26/19 1313   06/26/19 1800  vancomycin (VANCOCIN) IVPB 750 mg/150 ml premix  Status:  Discontinued     750 mg 150 mL/hr over 60 Minutes Intravenous Every 12 hours 06/26/19 1313 06/26/19 1316   06/26/19 1800  vancomycin (VANCOREADY) IVPB 750 mg/150 mL  Status:  Discontinued     750 mg 150 mL/hr over 60  Minutes Intravenous Every 12 hours 06/26/19 1316 06/27/19 1201   06/26/19 0900  ceFEPIme (MAXIPIME) 2 g in sodium chloride 0.9 % 100 mL IVPB  Status:  Discontinued     2 g 200 mL/hr over 30 Minutes Intravenous Every 8 hours 06/26/19 0853 06/27/19 1137   06/26/19 0800  vancomycin (VANCOCIN) IVPB 1000 mg/200 mL premix     1,000 mg 200 mL/hr over 60 Minutes Intravenous  Once 06/26/19 0745 06/26/19 0925   06/26/19 0045  cefTRIAXone (ROCEPHIN) 1 g in sodium chloride 0.9 % 100 mL IVPB     1 g 200 mL/hr over 30 Minutes Intravenous  Once 06/26/19 0030 06/26/19 0153     Subjective: Seen and examined at bedside there was a little bit drowsy but still complained of significant headache.  Was not feeling well again today.  Had no more fevers but denies any cough, shortness of breath, nausea or vomiting.  No urinary discomfort.  Main complaint remains a headache.  No other concerns or complaints at this time.  Objective: Vitals:   06/27/19 2126 06/28/19 0008 06/28/19 0413 06/28/19 1202  BP: (!) 186/87 103/66 109/71   Pulse: (!) 54 87 86 77  Resp:  '18 18 16  '$ Temp: 97.7 F (36.5 C) 98.4 F (36.9 C) 98.9 F (37.2 C)   TempSrc: Oral Oral Oral   SpO2: 94% 100% 96% 97%  Weight:      Height:        Intake/Output Summary (Last 24 hours) at 06/28/2019 1207 Last data filed at 06/28/2019 0600 Gross per 24 hour  Intake 1460 ml  Output --  Net 1460 ml   Filed Weights   06/25/19 2354  Weight: 68 kg   Examination: Physical Exam:  Constitutional: Well-nourished, well-developed ill-appearing Caucasian female who is a little bit drowsy and does appear a little bit uncomfortable about a headache Eyes: Lids and conjunctivae normal, sclerae anicteric  ENMT:  External Ears, Nose appear normal. Grossly normal hearing.   Neck: Appears normal, supple, no cervical masses, normal ROM, no appreciable thyromegaly neck: No JVD Respiratory: Diminished to auscultation bilaterally, no wheezing, rales, rhonchi or  crackles. Normal respiratory effort and patient is not tachypenic. No accessory muscle use.  Unlabored breathing Cardiovascular: RRR, no murmurs / rubs / gallops. S1 and S2 auscultated. No extremity edema.  Abdomen: Soft, non-tender, non-distended. Bowel sounds positive.  GU: Deferred. Musculoskeletal: No clubbing / cyanosis of digits/nails. No joint deformity upper and lower extremities.  Skin: No rashes, lesions, ulcers on limited skin evaluation but does have some scattered tattoos on her body including her left wrist and left leg and foot. No induration; Warm and dry.  Neurologic: CN 2-12 grossly intact with no focal deficits.  Romberg sign and cerebellar reflexes not assessed.  Psychiatric: Normal judgment and insight. Alert and oriented x 3.  Slightly drowsy mood and appropriate affect.   Data Reviewed: I have personally reviewed following labs and imaging studies  CBC: Recent Labs  Lab 06/25/19 2231 06/26/19 0113 06/27/19 0656 06/28/19 0548  WBC 11.2*  --  25.2* 16.4*  NEUTROABS 9.0*  --   --  14.2*  HGB 11.7* 11.9* 9.7* 9.2*  HCT 36.5 35.0* 31.4* 28.7*  MCV 96.3  --  101.3* 97.3  PLT 309  --  244 389   Basic Metabolic Panel: Recent Labs  Lab 06/25/19 2231 06/26/19 0113 06/27/19 0656 06/28/19 0548  NA 132* 131* 140 141  K 4.0 3.9 3.5 4.0  CL 101 100 114* 110  CO2 23  --  18* 24  GLUCOSE 135* 134* 117* 148*  BUN 6 5* 8 7  CREATININE 0.77 0.90 0.70 0.49  CALCIUM 8.3*  --  7.3* 7.8*  MG  --   --  2.0 1.8  PHOS  --   --   --  2.2*   GFR: Estimated Creatinine Clearance: 91 mL/min (by C-G formula based on SCr of 0.49 mg/dL). Liver Function Tests: Recent Labs  Lab 06/25/19 2231 06/27/19 0656 06/28/19 0548  AST '24 24 19  '$ ALT 24 32 28  ALKPHOS 56 53 55  BILITOT 0.8 0.8 0.6  PROT 7.3 6.0* 5.0*  ALBUMIN 3.7 2.7* 2.3*   No results for input(s): LIPASE, AMYLASE in the last 168 hours. No results for input(s): AMMONIA in the last 168 hours. Coagulation  Profile: Recent Labs  Lab 06/25/19 2231  INR 1.0   Cardiac Enzymes: No results for input(s): CKTOTAL, CKMB, CKMBINDEX, TROPONINI in the last 168 hours. BNP (last 3 results) No results for input(s): PROBNP in the last 8760 hours. HbA1C: No results for input(s): HGBA1C in the last 72 hours. CBG: No results for input(s): GLUCAP in the last 168 hours. Lipid Profile: No results for input(s): CHOL, HDL, LDLCALC, TRIG, CHOLHDL, LDLDIRECT in the last 72 hours. Thyroid Function Tests: Recent Labs    06/28/19 0548  TSH 2.607   Anemia Panel: Recent Labs    06/28/19 0548  VITAMINB12 1,355*  FOLATE 9.5  FERRITIN 133  TIBC 231*  IRON 9*  RETICCTPCT 1.2   Sepsis Labs: Recent Labs  Lab 06/26/19 0031  LATICACIDVEN 0.8    Recent Results (from the past 240 hour(s))  SARS CORONAVIRUS 2 (TAT 6-24 HRS) Nasopharyngeal Nasopharyngeal Swab     Status: None   Collection Time: 06/23/19 10:08 AM   Specimen: Nasopharyngeal Swab  Result Value Ref Range Status   SARS Coronavirus 2 NEGATIVE NEGATIVE Final  Comment: (NOTE) SARS-CoV-2 target nucleic acids are NOT DETECTED. The SARS-CoV-2 RNA is generally detectable in upper and lower respiratory specimens during the acute phase of infection. Negative results do not preclude SARS-CoV-2 infection, do not rule out co-infections with other pathogens, and should not be used as the sole basis for treatment or other patient management decisions. Negative results must be combined with clinical observations, patient history, and epidemiological information. The expected result is Negative. Fact Sheet for Patients: SugarRoll.be Fact Sheet for Healthcare Providers: https://www.woods-mathews.com/ This test is not yet approved or cleared by the [place] FDA and  has been authorized for detection and/or diagnosis of SARS-CoV-2 by FDA under an Emergency Use Authorization (EUA). This EUA will remain  in  effect (meaning this test can be used) for the duration of the COVID-19 declaration under Section 56 4(b)(1) of the Act, 21 U.S.C. section 360bbb-3(b)(1), unless the authorization is terminated or revoked sooner. Performed at [facility], [place] [address], [place], [place] [address]   Culture, blood (Routine x 2)     Status: None (Preliminary result)   Collection Time: 06/25/19 10:31 PM   Specimen: BLOOD  Result Value Ref Range Status   Specimen Description   Final    BLOOD RIGHT HAND Performed at [facility] [address], [place], [place] [address]    Special Requests   Final    BOTTLES DRAWN AEROBIC AND ANAEROBIC Blood Culture results may not be optimal due to an inadequate volume of blood received in culture bottles Performed at [facility] [address], [place], [place] [address]    Culture   Final    NO GROWTH 2 DAYS Performed at [facility] [address] [place], [place] [address]    Report Status PENDING  Incomplete  Culture, blood (Routine x 2)     Status: None (Preliminary result)   Collection Time: 06/25/19 10:36 PM   Specimen: BLOOD RIGHT FOREARM  Result Value Ref Range Status   Specimen Description   Final    BLOOD RIGHT FOREARM Performed at [facility] [address] [place], [place] [address]    Special Requests   Final    BOTTLES DRAWN AEROBIC AND ANAEROBIC Blood Culture adequate volume Performed at [facility] [address], [place], [place] [address]    Culture   Final    NO GROWTH 2 DAYS Performed at [facility] [address], [place], [place] [address]    Report Status PENDING  Incomplete  Urine culture     Status: Abnormal   Collection Time: 06/26/19 12:55 AM   Specimen: Urine, Random  Result Value Ref Range Status   Specimen Description   Final    URINE, RANDOM Performed at [facility] [address],  [place], [place] [address]    Special Requests   Final    NONE Performed at [facility], [place] [address] [place], [place] [address]    Culture (A)  Final    <10,000 COLONIES/mL INSIGNIFICANT GROWTH Performed at [facility] [address] [place], [place] [address]    Report Status 06/27/2019 FINAL  Final  Respiratory Panel by RT PCR (Flu A&B, Covid) - Nasopharyngeal Swab     Status: None   Collection Time: 06/26/19  2:58 AM   Specimen: Nasopharyngeal Swab  Result Value Ref Range Status   SARS Coronavirus 2 by RT PCR NEGATIVE NEGATIVE  Final    Comment: (NOTE) SARS-CoV-2 target nucleic acids are NOT DETECTED. The SARS-CoV-2 RNA is generally detectable in upper respiratoy specimens during the acute phase of infection. The lowest concentration of SARS-CoV-2 viral copies this assay can detect is 131 copies/mL. A negative result does not preclude SARS-Cov-2 infection and should not be used as the sole basis for treatment or other patient management decisions. A negative result may occur with  improper specimen collection/handling, submission of specimen other than nasopharyngeal swab, presence of viral mutation(s) within the areas targeted by this assay, and inadequate number of viral copies (<131 copies/mL). A negative result must be combined with clinical observations, patient history, and epidemiological information. The expected result is Negative. Fact Sheet for Patients:  PinkCheek.be Fact Sheet for Healthcare Providers:  GravelBags.it This test is not yet ap proved or cleared by the [place] FDA and  has been authorized for detection and/or diagnosis of SARS-CoV-2 by FDA under an Emergency Use Authorization (EUA). This EUA will remain  in effect (meaning this test can be used) for the duration of the COVID-19 declaration under Section 564(b)(1) of the Act, 21 U.S.C. section 360bbb-3(b)(1),  unless the authorization is terminated or revoked sooner.    Influenza A by PCR NEGATIVE NEGATIVE Final   Influenza B by PCR NEGATIVE NEGATIVE Final    Comment: (NOTE) The Xpert Xpress SARS-CoV-2/FLU/RSV assay is intended as an aid in  the diagnosis of influenza from Nasopharyngeal swab specimens and  should not be used as a sole basis for treatment. Nasal washings and  aspirates are unacceptable for Xpert Xpress SARS-CoV-2/FLU/RSV  testing. Fact Sheet for Patients: PinkCheek.be Fact Sheet for Healthcare Providers: GravelBags.it This test is not yet approved or cleared by the [place] FDA and  has been authorized for detection and/or diagnosis of SARS-CoV-2 by  FDA under an Emergency Use Authorization (EUA). This EUA will remain  in effect (meaning this test can be used) for the duration of the  Covid-19 declaration under Section 564(b)(1) of the Act, 21  U.S.C. section 360bbb-3(b)(1), unless the authorization is  terminated or revoked. Performed at [facility], [place] [address] [place], [place] [address]   CSF culture     Status: None (Preliminary result)   Collection Time: 06/27/19  3:00 PM   Specimen: PATH Cytology CSF; Cerebrospinal Fluid  Result Value Ref Range Status   Specimen Description   Final    CSF Performed at [facility] [address] [place], [place] [address]    Special Requests   Final    NONE Performed at [facility], [place] [address], [place], [place] [address]    Gram Stain   Final    WBC PRESENT, PREDOMINANTLY MONONUCLEAR NO ORGANISMS SEEN K.WILSON,RN [id number] '@[id number]'$  BY V.WILKINS    Culture   Final    NO GROWTH < 12 HOURS Performed at [facility] [address], [place], [place] [address]    Report Status PENDING  Incomplete  Culture, fungus without smear     Status: None (Preliminary result)   Collection Time: 06/27/19   3:00 PM   Specimen: PATH Cytology CSF; Cerebrospinal Fluid  Result Value Ref Range Status   Specimen Description   Final    CSF Performed at [facility] [address] [place], [place] [address]    Special Requests   Final    NONE Performed at [facility], [place] [address], [place], [place] [address]  Culture   Final    NO FUNGUS ISOLATED AFTER 1 DAY Performed at [facility], [place] [address] [place], [place] [address]    Report Status PENDING  Incomplete     RN Pressure Injury Documentation:     Estimated body mass index is 24.21 kg/m as calculated from the following:   Height as of this encounter: '5\' 6"'$  (1.676 m).   Weight as of this encounter: 68 kg.  Malnutrition Type:      Malnutrition Characteristics:      Nutrition Interventions:    Radiology Studies: MR BRAIN W WO CONTRAST  Result Date: 06/27/2019 CLINICAL DATA:  Chronic headache.  History of meningitis in January. EXAM: MRI HEAD WITHOUT AND WITH CONTRAST TECHNIQUE: Multiplanar, multiecho pulse sequences of the brain and surrounding structures were obtained without and with intravenous contrast. CONTRAST:  73m GADAVIST GADOBUTROL 1 MMOL/ML IV SOLN COMPARISON:  04/27/2019 FINDINGS: Brain: The brain has a normal appearance without evidence of malformation, atrophy, old or acute small or large vessel infarction, mass lesion, hemorrhage, hydrocephalus or extra-axial collection. After contrast administration, no abnormal enhancement occurs. Vascular: Major vessels at the base of the brain show flow. Venous sinuses appear patent. Skull and upper cervical spine: Normal. Sinuses/Orbits: Clear/normal. Other: None significant. IMPRESSION: Normal examination. No evidence of active meningitis. No old insult evident by MRI. Electronically Signed   By: MNelson ChimesM.D.   On: 06/27/2019 14:14   DG FLUORO GUIDE LUMBAR PUNCTURE  Result Date: 06/27/2019 CLINICAL DATA:  Fever,  headache and history of meningitis. EXAM: DIAGNOSTIC LUMBAR PUNCTURE UNDER FLUOROSCOPIC GUIDANCE FLUOROSCOPY TIME:  Fluoroscopy Time:  0 minutes and 48 seconds Radiation Exposure Index (if provided by the fluoroscopic device): 5 mGy Number of Acquired Spot Images: 0 PROCEDURE: Informed consent was obtained from the patient prior to the procedure, including potential complications of headache, allergy, and pain. With the patient prone, the lower back was prepped with Betadine. 1% Lidocaine was used for local anesthesia. Lumbar puncture was performed at the L3-4 level using a 20 gauge needle with return of clear CSF with an opening pressure of 34 cm H2O. 13 ml of CSF were obtained for laboratory studies. Closing pressure was 21 cm H2O. The patient tolerated the procedure well and there were no apparent complications. IMPRESSION: Fluoroscopic guided lumbar puncture at L3-4 with clear CSF obtained for appropriate laboratory evaluation. High opening pressure of 34 cm H2O. Electronically Signed   By: PMarijo SanesM.D.   On: 06/27/2019 15:43   Scheduled Meds: . busPIRone  5 mg Oral BID  . gabapentin  100 mg Oral QHS  . levothyroxine  75 mcg Oral Q0600  . loratadine  10 mg Oral QHS  . phosphorus  500 mg Oral BID  . sertraline  100 mg Oral QHS   Continuous Infusions: . acyclovir 675 mg (06/28/19 0159)  . meropenem (MERREM) IV 2 g (06/28/19 0839)  . sodium bicarbonate 150 mEq in dextrose 5% 1000 mL 150 mEq (06/28/19 1157)  . vancomycin 750 mg (06/28/19 0508)    LOS: 2 days   OKerney Elbe DO [facility] [place] 7PM[place]7AM, please contact night-coverage [email or web address]

## 2019-06-28 NOTE — Progress Notes (Signed)
  Echocardiogram 2D Echocardiogram has been performed.   A  06/28/2019, 1:45 PM

## 2019-06-29 DIAGNOSIS — E039 Hypothyroidism, unspecified: Secondary | ICD-10-CM | POA: Diagnosis not present

## 2019-06-29 DIAGNOSIS — R509 Fever, unspecified: Secondary | ICD-10-CM | POA: Diagnosis not present

## 2019-06-29 DIAGNOSIS — D72829 Elevated white blood cell count, unspecified: Secondary | ICD-10-CM | POA: Diagnosis not present

## 2019-06-29 DIAGNOSIS — A419 Sepsis, unspecified organism: Secondary | ICD-10-CM | POA: Diagnosis not present

## 2019-06-29 LAB — CBC WITH DIFFERENTIAL/PLATELET
Abs Immature Granulocytes: 0.14 10*3/uL — ABNORMAL HIGH (ref 0.00–0.07)
Basophils Absolute: 0 10*3/uL (ref 0.0–0.1)
Basophils Relative: 0 %
Eosinophils Absolute: 0.2 10*3/uL (ref 0.0–0.5)
Eosinophils Relative: 2 %
HCT: 33.4 % — ABNORMAL LOW (ref 36.0–46.0)
Hemoglobin: 10 g/dL — ABNORMAL LOW (ref 12.0–15.0)
Immature Granulocytes: 1 %
Lymphocytes Relative: 13 %
Lymphs Abs: 1.6 10*3/uL (ref 0.7–4.0)
MCH: 30.3 pg (ref 26.0–34.0)
MCHC: 29.9 g/dL — ABNORMAL LOW (ref 30.0–36.0)
MCV: 101.2 fL — ABNORMAL HIGH (ref 80.0–100.0)
Monocytes Absolute: 0.9 10*3/uL (ref 0.1–1.0)
Monocytes Relative: 7 %
Neutro Abs: 9.4 10*3/uL — ABNORMAL HIGH (ref 1.7–7.7)
Neutrophils Relative %: 77 %
Platelets: 284 10*3/uL (ref 150–400)
RBC: 3.3 MIL/uL — ABNORMAL LOW (ref 3.87–5.11)
RDW: 13.5 % (ref 11.5–15.5)
WBC: 12.2 10*3/uL — ABNORMAL HIGH (ref 4.0–10.5)
nRBC: 0 % (ref 0.0–0.2)

## 2019-06-29 LAB — COMPREHENSIVE METABOLIC PANEL
ALT: 27 U/L (ref 0–44)
AST: 15 U/L (ref 15–41)
Albumin: 2.7 g/dL — ABNORMAL LOW (ref 3.5–5.0)
Alkaline Phosphatase: 67 U/L (ref 38–126)
Anion gap: 7 (ref 5–15)
BUN: 8 mg/dL (ref 6–20)
CO2: 24 mmol/L (ref 22–32)
Calcium: 8.2 mg/dL — ABNORMAL LOW (ref 8.9–10.3)
Chloride: 109 mmol/L (ref 98–111)
Creatinine, Ser: 0.61 mg/dL (ref 0.44–1.00)
GFR calc Af Amer: >60 mL/min (ref >60)
GFR calc non Af Amer: >60 mL/min (ref >60)
Glucose, Bld: 104 mg/dL — ABNORMAL HIGH (ref 70–99)
Potassium: 4.6 mmol/L (ref 3.5–5.1)
Sodium: 140 mmol/L (ref 135–145)
Total Bilirubin: 0.7 mg/dL (ref 0.3–1.2)
Total Protein: 6.4 g/dL — ABNORMAL LOW (ref 6.5–8.1)

## 2019-06-29 LAB — MAGNESIUM: Magnesium: 1.9 mg/dL (ref 1.7–2.4)

## 2019-06-29 LAB — HSV DNA BY PCR (REFERENCE LAB)
HSV 1 DNA: NEGATIVE
HSV 2 DNA: NEGATIVE

## 2019-06-29 LAB — PHOSPHORUS: Phosphorus: 2.4 mg/dL — ABNORMAL LOW (ref 2.5–4.6)

## 2019-06-29 LAB — HEMOGLOBIN A1C
Hgb A1c MFr Bld: 5.4 % (ref 4.8–5.6)
Mean Plasma Glucose: 108.28 mg/dL

## 2019-06-29 MED ORDER — PROCHLORPERAZINE EDISYLATE 10 MG/2ML IJ SOLN
10.0000 mg | Freq: Once | INTRAMUSCULAR | Status: AC
  Administered 2019-06-29: 10 mg via INTRAVENOUS
  Filled 2019-06-29: qty 2

## 2019-06-29 MED ORDER — ENOXAPARIN SODIUM 40 MG/0.4ML ~~LOC~~ SOLN
40.0000 mg | SUBCUTANEOUS | Status: DC
  Administered 2019-06-29 – 2019-06-30 (×2): 40 mg via SUBCUTANEOUS
  Filled 2019-06-29 (×2): qty 0.4

## 2019-06-29 MED ORDER — SODIUM CHLORIDE 0.9 % IV SOLN
1.0000 g | Freq: Three times a day (TID) | INTRAVENOUS | Status: DC
  Administered 2019-06-29 – 2019-06-30 (×3): 1 g via INTRAVENOUS
  Filled 2019-06-29 (×4): qty 1

## 2019-06-29 MED ORDER — DIVALPROEX SODIUM 250 MG PO DR TAB
1000.0000 mg | DELAYED_RELEASE_TABLET | Freq: Once | ORAL | Status: AC
  Administered 2019-06-29: 1000 mg via ORAL
  Filled 2019-06-29: qty 4

## 2019-06-29 MED ORDER — DIPHENHYDRAMINE HCL 50 MG/ML IJ SOLN
25.0000 mg | Freq: Once | INTRAMUSCULAR | Status: AC
  Administered 2019-06-29: 25 mg via INTRAVENOUS
  Filled 2019-06-29: qty 1

## 2019-06-29 MED ORDER — K PHOS MONO-SOD PHOS DI & MONO 155-852-130 MG PO TABS
500.0000 mg | ORAL_TABLET | Freq: Once | ORAL | Status: AC
  Administered 2019-06-29: 500 mg via ORAL
  Filled 2019-06-29: qty 2

## 2019-06-29 NOTE — Progress Notes (Signed)
PHARMACY NOTE:  ANTIMICROBIAL RENAL DOSAGE ADJUSTMENT  Current antimicrobial regimen includes a mismatch between antimicrobial dosage and estimated renal function.  As per policy approved by the Pharmacy & Therapeutics and Medical Executive Committees, the antimicrobial dosage will be adjusted accordingly.  Current antimicrobial dosage:  Meropenem 2 g IV q8h  Indication: Previously on broad spectrum antibiotics for r/o meningitis. ID following, LP WNL and antibiotics de-escalated. 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   Renal Function: SCr WNL.  Estimated Creatinine Clearance: 91 mL/min (by C-G formula based on SCr of 0.61 mg/dL).    Antimicrobial dosage has been changed to:  Meropenem 1 g IV q8h  Thank you for allowing pharmacy to be a part of this patient's care.  Cindi Carbon, PharmD 06/29/19 10:12 AM

## 2019-06-29 NOTE — Progress Notes (Signed)
[facility] set up for pt transfer to [facility] for TEE @ 1030. Weston Settle, RN

## 2019-06-29 NOTE — Progress Notes (Signed)
    [facility] has been requested to perform a transesophageal echocardiogram on Megan Lara for bacteremia.  After careful review of history and examination, the risks and benefits of transesophageal echocardiogram have been explained including risks of esophageal damage, perforation (1:10,000 risk), bleeding, pharyngeal hematoma as well as other potential complications associated with conscious sedation including aspiration, arrhythmia, respiratory failure and death. Alternatives to treatment were discussed, questions were answered. Patient is willing to proceed.   Georgie Chard, NP  06/29/2019 2:54 PM

## 2019-06-29 NOTE — Progress Notes (Signed)
PROGRESS NOTE    Megan Lara  JGG:[id number] DOB: [birth year] DOA: 06/25/2019 PCP: Jonathon Jordan, MD   Brief Narrative:  HPI per Dr. Aline August on 06/26/19 Megan Lara is a 39 y.o. female with medical history significant of Asthma,  Hypothyroidism, depression, right ureteral stone status post extracorporeal shockwave lithotripsy on 06/24/2019 by Dr. Ottelin/urology presented with fever since yesterday.  Patient is having high-grade fever with chills since yesterday but denies worsening cough, shortness of breath, chest pain, vomiting, abdominal pain, diarrhea.  She denies any increased frequency of urination or dysuria but complains of some flank pain along with nausea.  She has very poor appetite.  Denies loss of consciousness, seizures, rash.  Has not had her Covid vaccine yet.  ED Course: Patient was found to be febrile, tachycardic with mild leukocytosis.  She was started on IV fluids and antibiotics.  CT of the abdomen and pelvis with contrast showed postprocedural changes of recent lithotripsy with trace amount of right perinephric fluid and soft tissue stranding but no obstructing stone.  Chest x-ray was negative for infiltrates.  **Interim History She thought to have a urinary tract infection however based on examination today she appears to have meningitis given her severe headache, photophobia as well as neck pain and mild stiffness.  She did spike a temperature of 102.4 yesterday and she has no other focalizing symptoms of infection denies any shortness of breath, nausea or vomiting.  Will obtain MRI of the brain with and without contrast as well as an LP to further delineate and start the patient on IV ceftriaxone, IV vancomycin and IV acyclovir  MRI of the brain showed a normal examination and no evidence of any active meningitis and no old insult evident by MRI.  The LP was done and only had 4 WBCs in the CSF and did not appear infected but CSF culture and  Gram stain is still pending along with fungus culture.  Patient's inflammatory marker still remains significantly elevated with a CRP of 26.3 and a ESR of 79.  The WBC is slowly trending down and improving.  Will obtain echocardiogram to rule out any pathology on the heart that may be causing her fevers and there was a ? Vegetation on the Pulmonic Valve so she will under go TEE in the AM.  She did spike a temperature the night before last but is improved today but continues to complain of headache but not as bad  Assessment & Plan:   Principal Problem:   Fever Active Problems:   Hypothyroidism   Leukocytosis   Sepsis ([place])  Sepsis present on admission initially thought to be from a complicated UTI but now likely secondary to ?Meningioencephalitis vs. Other Etiology; Meningoencephalitis is less likely given her Negative LP Headache  Leukocytosis, improving  -Presented with a temperature 102.4, pulse rate of 144, respiratory rate of 31 and hypotension as well as a leukocytosis and now likely a source of infection and the meninges; she continued to spike a temperature and had TMax of 101.4 the day before yesterday but is improved today and WBC is trending down further -Continues to complain of significant headache -Initially thought to be from complicated UTI in a patient with right ureteral stone status post recent extracorporeal shockwave lithotripsy on 06/24/2019 however she is not have any urinary symptoms and urinalysis was not very revealing as it showed a clear appearance with moderate hemoglobin but rare bacteria and greater than 50 RBCs per high-power field; also has 0-5, epithelial  cells and 6-10 WBCs and urine culture showed less than 10,000 colonies of insignificant growth -Blood cultures x2 showed no growth to date at 2 days -Patient had ESWL on 06/24/2019 by Dr. Ottelin/Urology and presented with fever since yesterday with tachycardia and mild leukocytosis -She is now complaining of  significant headache and she has some neck pain and some stiffness; states that she is admitted in January for meningitis -CT of the abdomen and pelvis showed postprocedural changes of recent lithotripsy with trace amount of right perinephric fluid and soft tissue stranding, mild edema in the right kidney but no obstructing stone. -We will continue broad-spectrum antibiotics for now including cefepime and vancomycin however we have changed to Augmentin diabetes antibiotics with IV ceftriaxone 2 g, IV vancomycin and have added IV acyclovir -Dr. Starla Link spoke to Dr. Winter/Urology who recommended conservative medical management and no need for current surgical intervention.  Outpatient follow-up with urology -Because of the constellation of her symptoms and her fever of 102.4, headache and neck stiffness we have strong suspicion for meningitis -I spoke to neurology who recommended changing to the cefepime to meropenem for broader coverage and was adding IV acyclovir.  Dr. Rory Percy recommends obtaining an MRI of the brain with and without contrast as well as an LP next-both of these have been requested to be done stat -ABx per ID and they have are changing the patient to IV Cefazolin 2 grams q8h  now Monotherapy given that she had no findings of ESBL pathogens -Currently had a significant headache but is improving Ibuprofen and Acetaminophen stopped. She is now getting IV Compazine and IV Benadryl.  We will stop IV ketorolac every 6 hours as needed for severe pain; headache could have been worsened in the setting of her fever -WBC has acutely worsened and gone from 11.2 -> 25.2 -> 16.4 -> 12.1 -Follow-up on lumbar puncture as well as MRI of the brain earlier done -CRP was 26.4 and repeat was 26.3; ESR was 79 -Her SARS-CoV-2 testing was negative and chest x-ray is negative for any acute abnormalities -IVF now stopped  -Continue supportive care and antiemetics with p.o./IV Ondansetron 4 mg every 6 hours as needed  nausea -Lactic acid level on admission was 0.8 -Continue to monitor for signs and symptoms of infection and will obtain echocardiogram as the MRI was unrevealing and CSF did not appear to be added as it had a clear appearance with 62 glucose, 2 RBCs, 4 WBCs, too few to count low normal cells, and 30 protein; CSF Gram stain and culture are still pending with fungus culture plated so far -ECHOCardiogram showed normal EF but did show "Cannot exclude small independently mobile structure on pulmonary valve seen best in clip 19. Trivial pulmonary valve regurgitation, no pulmonary  valve stenosis." -We will undergo a TEE evaluation in the a.m. -We will await further infectious disease recommendations and appreciate their assistance; Dr. Gilda Crease evaluated and felt that she did have a questionable vegetation on TTE and had cardiology reevaluate.  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 Baxter Flattery recommending protested I spoke with Dr. Lorraine Lax of neurology who recommends giving her 1 g as the patient is not pregnant and her LFTs are fine.  Unfortunately we could not give to her IV he felt was better but because of national shortage we will give her to her p.o.  Hyponatremia -Patient sodium on admission was 131  and is now improved to 140 -IV fluids have now been stopped -continue to monitor and trend and repeat CMP in a.m.  Hypothyroidism -Checked TSH and was 2.607. -Continue Levothyroxine 75 mcg p.o. daily  Asthma -Currently controlled.  Continue with albuterol 2.5 mg IH every 6 as needed for wheezing  Depression  -Continue Sertraline 100 mg p.o. nightly, as well as buspirone 5 mg p.o. twice daily  Metabolic Acidosis,  -Patient's CO2 was 18, chloride level was 114, and anion gap was 8 -IV fluid hydration is now stopped.  Now repeat CO2 24, anion gap is 7, and chloride level is 109 -Continue to monitor and  repeat CMP in a.m.  Normocytic Anemia now with a transient Macrocytosis/Macrocytic Anemia -Patient's hemoglobin/hematocrit went from 11.7/36.5 on admission is now 10.0/33.4 and likely dilutional drop; MCV is now 97.3 -Checked Anemia Panel and showed iron level of 9, U IBC of 222, TIBC of 231, saturation ratios of 4%, ferritin level 133, folate level 9.5, and vitamin B12 of 1355 -likely will need iron supplementation but will defer at this time until a source of infection is found. -Continue to monitor for signs and symptoms of bleeding; Currently no overt bleeding noted -Repeat CBC in AM   Hypophosphatemia -Patient's phosphorus level this morning was 2.4 -Replete with p.o. K-Phos Neutral 500 mg p.o. x1 -Continue to monitor and replete as necessary -Repeat phosphorus level in the a.m.  Hyperglycemia -Blood sugar was elevated on admission and has been ranging from 117-148 -Check hemoglobin A1c and it was 5.4 -Continue to monitor blood sugars carefully and if necessary with the sensitive NovoLog sliding scale insulin AC  DVT prophylaxis: SCDs; Will likely add Enoxaparin 40 mg sq 24h now that she has had an LP Code Status: FULL CODE  Family Communication: Discussed with Husband at bedside  Disposition Plan: Patient is from home now presenting with a severe headache and fever with concern for meningitis so she will need to remain inpatient for further work-up and will need an MRI as well as an LP  Status is: Inpatient  Remains inpatient appropriate because:Ongoing diagnostic testing needed not appropriate for outpatient work up, Unsafe d/c plan, IV treatments appropriate due to intensity of illness or inability to take PO and Inpatient level of care appropriate due to severity of illness   Dispo: The patient is from: Home              Anticipated d/c is to: Home              Anticipated d/c date is: 2 days              Patient currently is not medically stable to d/c.  Consultants:    Discussed Case with Neurology Dr. Rory Percy and now Dr. Lorraine Lax   Infectious Diseases Dr. Carlyle Basques    Procedures:  MRI Brain w/wo Contrast Lumbar Puncture   ECHOCardiogram IMPRESSIONS    1. Left ventricular ejection fraction, by estimation, is 60 to 65%. The  left ventricle has normal function. The left ventricle has no regional  wall motion abnormalities. Left ventricular diastolic parameters were  normal.  2. Right ventricular systolic function is normal. The right ventricular  size is normal. Tricuspid regurgitation signal is inadequate for assessing  PA pressure.  3. The mitral valve is normal in structure. No evidence of mitral valve  regurgitation. No evidence of mitral stenosis.  4. The aortic valve is normal in structure. Aortic valve regurgitation is  not visualized. No aortic  stenosis is present.  5. Cannot exclude small independently mobile structure on pulmonary valve  seen best in clip 19. Trivial pulmonary valve regurgitation, no pulmonary  valve stenosis.  6. The inferior vena cava is normal in size with <50% respiratory  variability, suggesting right atrial pressure of 8 mmHg.   Conclusion(s)/Recommendation(s): No definite evidence of valvular  vegetations on this transthoracic echocardiogram. Would recommend a  transesophageal echocardiogram to exclude infective endocarditis if  clinically indicated.   FINDINGS  Left Ventricle: Left ventricular ejection fraction, by estimation, is 60  to 65%. The left ventricle has normal function. The left ventricle has no  regional wall motion abnormalities. The left ventricular internal cavity  size was normal in size. There is  no left ventricular hypertrophy. Left ventricular diastolic parameters  were normal.   Right Ventricle: The right ventricular size is normal. No increase in  right ventricular wall thickness. Right ventricular systolic function is  normal. Tricuspid regurgitation signal is  inadequate for assessing PA  pressure.   Left Atrium: Left atrial size was normal in size.   Right Atrium: Right atrial size was normal in size.   Pericardium: Trivial pericardial effusion is present.   Mitral Valve: The mitral valve is normal in structure. Normal mobility of  the mitral valve leaflets. No evidence of mitral valve regurgitation. No  evidence of mitral valve stenosis.   Tricuspid Valve: The tricuspid valve is normal in structure. Tricuspid  valve regurgitation is not demonstrated. No evidence of tricuspid  stenosis.   Aortic Valve: The aortic valve is normal in structure. Aortic valve  regurgitation is not visualized. No aortic stenosis is present.   Pulmonic Valve: Cannot exclude small independently mobile structure on  pulmonary valve seen best in clip 19. Trivial pulmonary valve  regurgitation, no pulmonary valve stenosis. The pulmonic valve was normal  in structure. Pulmonic valve regurgitation is  trivial. No evidence of pulmonic stenosis.   Aorta: The aortic root is normal in size and structure.   Venous: The inferior vena cava is normal in size with less than 50%  respiratory variability, suggesting right atrial pressure of 8 mmHg.   IAS/Shunts: No atrial level shunt detected by color flow Doppler.     LEFT VENTRICLE  PLAX 2D  LVIDd:     4.50 cm Diastology  LVIDs:     2.74 cm LV e' lateral:  19.70 cm/s  LV PW:     0.90 cm LV E/e' lateral: 6.7  LV IVS:    0.88 cm LV e' medial:  13.20 cm/s  LVOT diam:   1.80 cm LV E/e' medial: 10.0  LV SV:     67  LV SV Index:  38  LVOT Area:   2.54 cm     RIGHT VENTRICLE  RV S prime:   15.20 cm/s  TAPSE (M-mode): 2.6 cm   LEFT ATRIUM      Index    RIGHT ATRIUM      Index  LA diam:   3.70 cm 2.09 cm/m RA Area:   16.80 cm  LA Vol (A4C): 64.5 ml 36.45 ml/m RA Volume:  42.50 ml 24.02 ml/m  AORTIC VALVE  LVOT Vmax:  135.00 cm/s  LVOT Vmean:  103.000 cm/s  LVOT VTI:  0.264 m    AORTA  Ao Root diam: 2.40 cm   MITRAL VALVE  MV Area (PHT): 5.23 cm   SHUNTS  MV Decel Time: 145 msec   Systemic VTI: 0.26 m  MV E velocity: 132.00  cm/s Systemic Diam: 1.80 cm  MV A velocity: 76.00 cm/s  MV E/A ratio: 1.74   Antimicrobials:  Anti-infectives (From admission, onward)   Start     Dose/Rate Route Frequency Ordered Stop   06/29/19 1400  meropenem (MERREM) 1 g in sodium chloride 0.9 % 100 mL IVPB     1 g 200 mL/hr over 30 Minutes Intravenous Every 8 hours 06/29/19 1012     06/28/19 0100  acyclovir (ZOVIRAX) 675 mg in dextrose 5 % 100 mL IVPB  Status:  Discontinued     675 mg 113.5 mL/hr over 60 Minutes Intravenous Every 8 hours 06/27/19 2133 06/28/19 1832   06/27/19 1600  meropenem (MERREM) 2 g in sodium chloride 0.9 % 100 mL IVPB  Status:  Discontinued     2 g 200 mL/hr over 30 Minutes Intravenous Every 8 hours 06/27/19 1447 06/29/19 1011   06/27/19 1445  meropenem (MERREM) injection 2 g  Status:  Discontinued     2 g Intramuscular Every 8 hours 06/27/19 1442 06/27/19 1446   06/27/19 1400  vancomycin (VANCOREADY) IVPB 750 mg/150 mL  Status:  Discontinued     750 mg 150 mL/hr over 60 Minutes Intravenous Every 8 hours 06/27/19 1201 06/28/19 1832   06/27/19 1400  acyclovir (ZOVIRAX) 675 mg in dextrose 5 % 100 mL IVPB  Status:  Discontinued     675 mg 113.5 mL/hr over 60 Minutes Intravenous Every 8 hours 06/27/19 1201 06/27/19 1203   06/27/19 1230  acyclovir (ZOVIRAX) 675 mg in dextrose 5 % 100 mL IVPB  Status:  Discontinued     675 mg 113.5 mL/hr over 60 Minutes Intravenous Every 8 hours 06/27/19 1203 06/27/19 2134   06/27/19 1215  cefTRIAXone (ROCEPHIN) 2 g in sodium chloride 0.9 % 100 mL IVPB  Status:  Discontinued     2 g 200 mL/hr over 30 Minutes Intravenous Every 12 hours 06/27/19 1201 06/27/19 1442   06/27/19 1200  cefTRIAXone (ROCEPHIN) 2 g in sodium chloride 0.9 % 100 mL IVPB  Status:  Discontinued     2 g 200  mL/hr over 30 Minutes Intravenous Every 24 hours 06/27/19 1146 06/27/19 1201   06/26/19 1800  vancomycin (VANCOCIN) IVPB 750 mg/150 ml premix  Status:  Discontinued     750 mg 150 mL/hr over 60 Minutes Intravenous 2 times daily 06/26/19 0853 06/26/19 1310   06/26/19 1800  vancomycin (VANCOCIN) IVPB 750 mg/150 ml premix  Status:  Discontinued     750 mg 150 mL/hr over 60 Minutes Intravenous Every 12 hours 06/26/19 1311 06/26/19 1313   06/26/19 1800  vancomycin (VANCOCIN) IVPB 750 mg/150 ml premix  Status:  Discontinued     750 mg 150 mL/hr over 60 Minutes Intravenous Every 12 hours 06/26/19 1313 06/26/19 1316   06/26/19 1800  vancomycin (VANCOREADY) IVPB 750 mg/150 mL  Status:  Discontinued     750 mg 150 mL/hr over 60 Minutes Intravenous Every 12 hours 06/26/19 1316 06/27/19 1201   06/26/19 0900  ceFEPIme (MAXIPIME) 2 g in sodium chloride 0.9 % 100 mL IVPB  Status:  Discontinued     2 g 200 mL/hr over 30 Minutes Intravenous Every 8 hours 06/26/19 0853 06/27/19 1137   06/26/19 0800  vancomycin (VANCOCIN) IVPB 1000 mg/200 mL premix     1,000 mg 200 mL/hr over 60 Minutes Intravenous  Once 06/26/19 0745 06/26/19 0925   06/26/19 0045  cefTRIAXone (ROCEPHIN) 1 g in sodium chloride 0.9 % 100 mL IVPB  1 g 200 mL/hr over 30 Minutes Intravenous  Once 06/26/19 0030 06/26/19 0153     Subjective: Seen and examined at bedside and states that headache was still there but not as bad and thinks she is getting a little bit better.  Happy that she did not have a fever.  No nausea or vomiting.  Understand that she needs to go for TEE and the cardiology team is on the phone with her when I walked in.  No other concerns or complaints at this time.  Objective: Vitals:   06/28/19 1708 06/28/19 2257 06/29/19 0514 06/29/19 1341  BP: 123/74 110/72 130/87 116/71  Pulse: 80 67 75 (!) 56  Resp:  '18 18 19  '$ Temp: 100.1 F (37.8 C) 98.2 F (36.8 C) 98.9 F (37.2 C) 99.3 F (37.4 C)  TempSrc: Oral Oral Oral  Oral  SpO2: 96% 94% 97% 94%  Weight:      Height:        Intake/Output Summary (Last 24 hours) at 06/29/2019 1529 Last data filed at 06/29/2019 0200 Gross per 24 hour  Intake 1198.63 ml  Output --  Net 1198.63 ml   Filed Weights   06/25/19 2354  Weight: 68 kg   Examination: Physical Exam:  Constitutional: WN/WD Caucasian female who is a little bit drowsy but appears more comfortable and still has a headache but states it is better.  She is in no acute distress. Eyes: Lids and conjunctivae normal, sclerae anicteric  ENMT: External Ears, Nose appear normal. Grossly normal hearing.  Neck: Appears normal, supple, no cervical masses, normal ROM, no appreciable thyromegaly,: No JVD Respiratory: Mildly diminished to auscultation bilaterally, no wheezing, rales, rhonchi or crackles. Normal respiratory effort and patient is not tachypenic. No accessory muscle use.  Unlabored breathing Cardiovascular: RRR, no murmurs / rubs / gallops. S1 and S2 auscultated. No extremity edema.  Abdomen: Soft, non-tender, non-distended. Bowel sounds positive.  GU: Deferred. Musculoskeletal: No clubbing / cyanosis of digits/nails. No joint deformity upper and lower extremities.  Skin: No rashes, lesions, ulcers on limited skin evaluation. No induration; Warm and dry.  Neurologic: CN 2-12 grossly intact with no focal deficits. Romberg sign and cerebellar reflexes not assessed.  Psychiatric: Normal judgment and insight. Alert and oriented x 3. Normal mood and appropriate affect.   Data Reviewed: I have personally reviewed following labs and imaging studies  CBC: Recent Labs  Lab 06/25/19 2231 06/26/19 0113 06/27/19 0656 06/28/19 0548 06/29/19 0551  WBC 11.2*  --  25.2* 16.4* 12.2*  NEUTROABS 9.0*  --   --  14.2* 9.4*  HGB 11.7* 11.9* 9.7* 9.2* 10.0*  HCT 36.5 35.0* 31.4* 28.7* 33.4*  MCV 96.3  --  101.3* 97.3 101.2*  PLT 309  --  244 235 295   Basic Metabolic Panel: Recent Labs  Lab 06/25/19 2231  06/26/19 0113 06/27/19 0656 06/28/19 0548 06/29/19 0551  NA 132* 131* 140 141 140  K 4.0 3.9 3.5 4.0 4.6  CL 101 100 114* 110 109  CO2 23  --  18* 24 24  GLUCOSE 135* 134* 117* 148* 104*  BUN 6 5* '8 7 8  '$ CREATININE 0.77 0.90 0.70 0.49 0.61  CALCIUM 8.3*  --  7.3* 7.8* 8.2*  MG  --   --  2.0 1.8 1.9  PHOS  --   --   --  2.2* 2.4*   GFR: Estimated Creatinine Clearance: 91 mL/min (by C-G formula based on SCr of 0.61 mg/dL). Liver Function Tests: Recent Labs  Lab 06/25/19 2231 06/27/19 0656 06/28/19 0548 06/29/19 0551  AST '24 24 19 15  '$ ALT 24 32 28 27  ALKPHOS 56 53 55 67  BILITOT 0.8 0.8 0.6 0.7  PROT 7.3 6.0* 5.0* 6.4*  ALBUMIN 3.7 2.7* 2.3* 2.7*   No results for input(s): LIPASE, AMYLASE in the last 168 hours. No results for input(s): AMMONIA in the last 168 hours. Coagulation Profile: Recent Labs  Lab 06/25/19 2231  INR 1.0   Cardiac Enzymes: No results for input(s): CKTOTAL, CKMB, CKMBINDEX, TROPONINI in the last 168 hours. BNP (last 3 results) No results for input(s): PROBNP in the last 8760 hours. HbA1C: Recent Labs    06/29/19 0551  HGBA1C 5.4   CBG: No results for input(s): GLUCAP in the last 168 hours. Lipid Profile: No results for input(s): CHOL, HDL, LDLCALC, TRIG, CHOLHDL, LDLDIRECT in the last 72 hours. Thyroid Function Tests: Recent Labs    06/28/19 0548  TSH 2.607   Anemia Panel: Recent Labs    06/28/19 0548  VITAMINB12 1,355*  FOLATE 9.5  FERRITIN 133  TIBC 231*  IRON 9*  RETICCTPCT 1.2   Sepsis Labs: Recent Labs  Lab 06/26/19 0031  LATICACIDVEN 0.8    Recent Results (from the past 240 hour(s))  SARS CORONAVIRUS 2 (TAT 6-24 HRS) Nasopharyngeal Nasopharyngeal Swab     Status: None   Collection Time: 06/23/19 10:08 AM   Specimen: Nasopharyngeal Swab  Result Value Ref Range Status   SARS Coronavirus 2 NEGATIVE NEGATIVE Final    Comment: (NOTE) SARS-CoV-2 target nucleic acids are NOT DETECTED. The SARS-CoV-2 RNA is  generally detectable in upper and lower respiratory specimens during the acute phase of infection. Negative results do not preclude SARS-CoV-2 infection, do not rule out co-infections with other pathogens, and should not be used as the sole basis for treatment or other patient management decisions. Negative results must be combined with clinical observations, patient history, and epidemiological information. The expected result is Negative. Fact Sheet for Patients: SugarRoll.be Fact Sheet for Healthcare Providers: https://www.woods-mathews.com/ This test is not yet approved or cleared by the [place] FDA and  has been authorized for detection and/or diagnosis of SARS-CoV-2 by FDA under an Emergency Use Authorization (EUA). This EUA will remain  in effect (meaning this test can be used) for the duration of the COVID-19 declaration under Section 56 4(b)(1) of the Act, 21 U.S.C. section 360bbb-3(b)(1), unless the authorization is terminated or revoked sooner. Performed at [facility], [place] [address] [place], [place] [address]   Culture, blood (Routine x 2)     Status: None (Preliminary result)   Collection Time: 06/25/19 10:31 PM   Specimen: BLOOD  Result Value Ref Range Status   Specimen Description   Final    BLOOD RIGHT HAND Performed at [facility] [address] [place], [place] [address]    Special Requests   Final    BOTTLES DRAWN AEROBIC AND ANAEROBIC Blood Culture results may not be optimal due to an inadequate volume of blood received in culture bottles Performed at [facility] [address], [place], [place] [address]    Culture   Final    NO GROWTH 3 DAYS Performed at [facility], [place] [address], [place], [place] [address]    Report Status PENDING  Incomplete  Culture, blood (Routine x 2)     Status: None (Preliminary result)   Collection Time: 06/25/19 10:36 PM    Specimen: BLOOD RIGHT  FOREARM  Result Value Ref Range Status   Specimen Description   Final    BLOOD RIGHT FOREARM Performed at [facility] [address] [place], [place] [address]    Special Requests   Final    BOTTLES DRAWN AEROBIC AND ANAEROBIC Blood Culture adequate volume Performed at [facility] [address], [place], [place] [address]    Culture   Final    NO GROWTH 3 DAYS Performed at [facility], [place] [address], [place], [place] [address]    Report Status PENDING  Incomplete  Urine culture     Status: Abnormal   Collection Time: 06/26/19 12:55 AM   Specimen: Urine, Random  Result Value Ref Range Status   Specimen Description   Final    URINE, RANDOM Performed at [facility] [address], [place], [place] [address]    Special Requests   Final    NONE Performed at [facility], [place] [address], [place], [place] [address]    Culture (A)  Final    <10,000 COLONIES/mL INSIGNIFICANT GROWTH Performed at [facility] [address] [place], [place] [address]    Report Status 06/27/2019 FINAL  Final  Respiratory Panel by RT PCR (Flu A&B, Covid) - Nasopharyngeal Swab     Status: None   Collection Time: 06/26/19  2:58 AM   Specimen: Nasopharyngeal Swab  Result Value Ref Range Status   SARS Coronavirus 2 by RT PCR NEGATIVE NEGATIVE Final    Comment: (NOTE) SARS-CoV-2 target nucleic acids are NOT DETECTED. The SARS-CoV-2 RNA is generally detectable in upper respiratoy specimens during the acute phase of infection. The lowest concentration of SARS-CoV-2 viral copies this assay can detect is 131 copies/mL. A negative result does not preclude SARS-Cov-2 infection and should not be used as the sole basis for treatment or other patient management decisions. A negative result may occur with  improper specimen collection/handling, submission of specimen other than nasopharyngeal  swab, presence of viral mutation(s) within the areas targeted by this assay, and inadequate number of viral copies (<131 copies/mL). A negative result must be combined with clinical observations, patient history, and epidemiological information. The expected result is Negative. Fact Sheet for Patients:  PinkCheek.be Fact Sheet for Healthcare Providers:  GravelBags.it This test is not yet ap proved or cleared by the [place] FDA and  has been authorized for detection and/or diagnosis of SARS-CoV-2 by FDA under an Emergency Use Authorization (EUA). This EUA will remain  in effect (meaning this test can be used) for the duration of the COVID-19 declaration under Section 564(b)(1) of the Act, 21 U.S.C. section 360bbb-3(b)(1), unless the authorization is terminated or revoked sooner.    Influenza A by PCR NEGATIVE NEGATIVE Final   Influenza B by PCR NEGATIVE NEGATIVE Final    Comment: (NOTE) The Xpert Xpress SARS-CoV-2/FLU/RSV assay is intended as an aid in  the diagnosis of influenza from Nasopharyngeal swab specimens and  should not be used as a sole basis for treatment. Nasal washings and  aspirates are unacceptable for Xpert Xpress SARS-CoV-2/FLU/RSV  testing. Fact Sheet for Patients: PinkCheek.be Fact Sheet for Healthcare Providers: GravelBags.it This test is not yet approved or cleared by the [place] FDA and  has been authorized for detection and/or diagnosis of SARS-CoV-2 by  FDA under an Emergency Use Authorization (EUA). This EUA will remain  in effect (meaning this test can be used) for the duration of the  Covid-19 declaration under Section 564(b)(1) of the Act, 21  U.S.C. section 360bbb-3(b)(1), unless the authorization is  terminated or revoked. Performed at [facility], [place] [address], [place], [place] [address]   CSF culture      Status: None (Preliminary result)   Collection Time: 06/27/19  3:00 PM   Specimen: PATH Cytology CSF; Cerebrospinal Fluid  Result Value Ref Range Status   Specimen Description   Final    CSF Performed at [place] [address], [place], [place] [address]    Special Requests   Final    NONE Performed at [facility], [place] [address] [place], [place] [address]    Gram Stain   Final    WBC PRESENT, PREDOMINANTLY MONONUCLEAR NO ORGANISMS SEEN K.WILSON,RN [id number] '@[id number]'$  BY V.WILKINS    Culture   Final    NO GROWTH 2 DAYS Performed at [facility], [place] [address] [place], [place] [address]    Report Status PENDING  Incomplete  Culture, fungus without smear     Status: None (Preliminary result)   Collection Time: 06/27/19  3:00 PM   Specimen: PATH Cytology CSF; Cerebrospinal Fluid  Result Value Ref Range Status   Specimen Description   Final    CSF Performed at [place] [address] [place], [place] [address]    Special Requests   Final    NONE Performed at [facility], [place] [address], [place], [place] [address]    Culture   Final    NO FUNGUS ISOLATED AFTER 1 DAY Performed at [facility], [place] [address], [place], [place] [address]    Report Status PENDING  Incomplete     RN Pressure Injury Documentation:     Estimated body mass index is 24.21 kg/m as calculated from the following:   Height as of this encounter: '5\' 6"'$  (1.676 m).   Weight as of this encounter: 68 kg.  Malnutrition Type:      Malnutrition Characteristics:      Nutrition Interventions:    Radiology Studies: ECHOCARDIOGRAM COMPLETE  Result Date: 06/28/2019    ECHOCARDIOGRAM REPORT   Patient Name:   JESSI PITSTICK Date of Exam: 06/28/2019 Medical Rec #:  [id number]               Height:       66.0 in Accession #:    [id number]              Weight:       150.0 lb Date of Birth:   [birth year]               BSA:          1.770 m Patient Age:    3 years                BP:           109/71 mmHg Patient Gender: F                       HR:           77 bpm. Exam Location:  Inpatient Procedure: 2D Echo Indications:   Fever 780.6 / R50.9  History:       Patient has no prior history of Echocardiogram examinations.                Sepsis.  Sonographer:   Vikki Ports Turrentine  Referring      [id number] [place] [facility] Phys: IMPRESSIONS  1. Left ventricular ejection fraction, by estimation, is 60 to 65%. The left ventricle has normal function. The left ventricle has no regional wall motion abnormalities. Left ventricular diastolic parameters were normal.  2. Right ventricular systolic function is normal. The right ventricular size is normal. Tricuspid regurgitation signal is inadequate for assessing PA pressure.  3. The mitral valve is normal in structure. No evidence of mitral valve regurgitation. No evidence of mitral stenosis.  4. The aortic valve is normal in structure. Aortic valve regurgitation is not visualized. No aortic stenosis is present.  5. Cannot exclude small independently mobile structure on pulmonary valve seen best in clip 19. Trivial pulmonary valve regurgitation, no pulmonary valve stenosis.  6. The inferior vena cava is normal in size with <50% respiratory variability, suggesting right atrial pressure of 8 mmHg. Conclusion(s)/Recommendation(s): No definite evidence of valvular vegetations on this transthoracic echocardiogram. Would recommend a transesophageal echocardiogram to exclude infective endocarditis if clinically indicated. FINDINGS  Left Ventricle: Left ventricular ejection fraction, by estimation, is 60 to 65%. The left ventricle has normal function. The left ventricle has no regional wall motion abnormalities. The left ventricular internal cavity size was normal in size. There is  no left ventricular hypertrophy. Left ventricular diastolic parameters were normal. Right  Ventricle: The right ventricular size is normal. No increase in right ventricular wall thickness. Right ventricular systolic function is normal. Tricuspid regurgitation signal is inadequate for assessing PA pressure. Left Atrium: Left atrial size was normal in size. Right Atrium: Right atrial size was normal in size. Pericardium: Trivial pericardial effusion is present. Mitral Valve: The mitral valve is normal in structure. Normal mobility of the mitral valve leaflets. No evidence of mitral valve regurgitation. No evidence of mitral valve stenosis. Tricuspid Valve: The tricuspid valve is normal in structure. Tricuspid valve regurgitation is not demonstrated. No evidence of tricuspid stenosis. Aortic Valve: The aortic valve is normal in structure. Aortic valve regurgitation is not visualized. No aortic stenosis is present. Pulmonic Valve: Cannot exclude small independently mobile structure on pulmonary valve seen best in clip 19. Trivial pulmonary valve regurgitation, no pulmonary valve stenosis. The pulmonic valve was normal in structure. Pulmonic valve regurgitation is trivial. No evidence of pulmonic stenosis. Aorta: The aortic root is normal in size and structure. Venous: The inferior vena cava is normal in size with less than 50% respiratory variability, suggesting right atrial pressure of 8 mmHg. IAS/Shunts: No atrial level shunt detected by color flow Doppler.  LEFT VENTRICLE PLAX 2D LVIDd:         4.50 cm  Diastology LVIDs:         2.74 cm  LV e' lateral:   19.70 cm/s LV PW:         0.90 cm  LV E/e' lateral: 6.7 LV IVS:        0.88 cm  LV e' medial:    13.20 cm/s LVOT diam:     1.80 cm  LV E/e' medial:  10.0 LV SV:         67 LV SV Index:   38 LVOT Area:     2.54 cm  RIGHT VENTRICLE RV S prime:     15.20 cm/s TAPSE (M-mode): 2.6 cm LEFT ATRIUM           Index       RIGHT ATRIUM           Index LA diam:  3.70 cm 2.09 cm/m  RA Area:     16.80 cm LA Vol (A4C): 64.5 ml 36.45 ml/m RA Volume:   42.50 ml   24.02 ml/m  AORTIC VALVE LVOT Vmax:   135.00 cm/s LVOT Vmean:  103.000 cm/s LVOT VTI:    0.264 m  AORTA Ao Root diam: 2.40 cm MITRAL VALVE MV Area (PHT): 5.23 cm     SHUNTS MV Decel Time: 145 msec     Systemic VTI:  0.26 m MV E velocity: 132.00 cm/s  Systemic Diam: 1.80 cm MV A velocity: 76.00 cm/s MV E/A ratio:  1.74 Cherlynn Kaiser MD Electronically signed by Cherlynn Kaiser MD Signature Date/Time: 06/28/2019/2:24:01 PM    Final    Scheduled Meds: . busPIRone  5 mg Oral BID  . gabapentin  100 mg Oral QHS  . levothyroxine  75 mcg Oral Q0600  . loratadine  10 mg Oral QHS  . sertraline  100 mg Oral QHS   Continuous Infusions: . meropenem (MERREM) IV 1 g (06/29/19 1410)    LOS: 3 days   Kerney Elbe, DO [facility] PAGER is on [place]  If 7PM-7AM, please contact night-coverage [email or web address]

## 2019-06-29 NOTE — Progress Notes (Addendum)
[facility]    Date of Admission:  06/25/2019   Total days of antibiotics 5           ID: Megan Lara is a 39 y.o. female with   Principal Problem:   Fever Active Problems:   Hypothyroidism   Leukocytosis   Sepsis (HCC)    Subjective: Had some relief with iv compazine and benadryl last night, slept well. This morning ha at 6 of 10 pain scale.fever curve continues to trend down  Had TTE yesterday that possibly showed small mobile structure to PV- though conclusion said no definite evidence of valvular vegetation  Medications:  . busPIRone  5 mg Oral BID  . diphenhydrAMINE  25 mg Intravenous Once  . gabapentin  100 mg Oral QHS  . levothyroxine  75 mcg Oral Q0600  . loratadine  10 mg Oral QHS  . prochlorperazine  10 mg Intravenous Once  . sertraline  100 mg Oral QHS    Objective: Vital signs in last 24 hours: Temp:  [98.2 F (36.8 C)-100.1 F (37.8 C)] 98.9 F (37.2 C) (04/27 0514) Pulse Rate:  [67-84] 75 (04/27 0514) Resp:  [16-18] 18 (04/27 0514) BP: (110-130)/(70-87) 130/87 (04/27 0514) SpO2:  [94 %-97 %] 97 % (04/27 0514)    Lab Results Recent Labs    06/28/19 0548 06/29/19 0551  WBC 16.4* 12.2*  HGB 9.2* 10.0*  HCT 28.7* 33.4*  NA 141 140  K 4.0 4.6  CL 110 109  CO2 24 24  BUN 7 8  CREATININE 0.49 0.61   Liver Panel Recent Labs    06/28/19 0548 06/29/19 0551  PROT 5.0* 6.4*  ALBUMIN 2.3* 2.7*  AST 19 15  ALT 28 27  ALKPHOS 55 67  BILITOT 0.6 0.7   Sedimentation Rate Recent Labs    06/28/19 0548  ESRSEDRATE 76*   C-Reactive Protein Recent Labs    06/27/19 0656 06/28/19 0548  CRP 26.4* 26.3*    Microbiology: reviewed Studies/Results: MR BRAIN W WO CONTRAST  Result Date: 06/27/2019 CLINICAL DATA:  Chronic headache.  History of meningitis in January. EXAM: MRI HEAD WITHOUT AND WITH CONTRAST TECHNIQUE: Multiplanar, multiecho pulse sequences of the brain and surrounding structures were  obtained without and with intravenous contrast. CONTRAST:  35mL GADAVIST GADOBUTROL 1 MMOL/ML IV SOLN COMPARISON:  04/27/2019 FINDINGS: Brain: The brain has a normal appearance without evidence of malformation, atrophy, old or acute small or large vessel infarction, mass lesion, hemorrhage, hydrocephalus or extra-axial collection. After contrast administration, no abnormal enhancement occurs. Vascular: Major vessels at the base of the brain show flow. Venous sinuses appear patent. Skull and upper cervical spine: Normal. Sinuses/Orbits: Clear/normal. Other: None significant. IMPRESSION: Normal examination. No evidence of active meningitis. No old insult evident by MRI. Electronically Signed   By: Paulina Fusi M.D.   On: 06/27/2019 14:14   ECHOCARDIOGRAM COMPLETE  Result Date: 06/28/2019    ECHOCARDIOGRAM REPORT   Patient Name:   Megan Lara Date of Exam: 06/28/2019 Medical Rec #:  [id number]               Height:       66.0 in Accession #:    [id number]              Weight:       150.0 lb Date of Birth:  02-07-1981               BSA:  1.770 m Patient Age:    38 years                BP:           109/71 mmHg Patient Gender: F                       HR:           77 bpm. Exam Location:  Inpatient Procedure: 2D Echo Indications:   Fever 780.6 / R50.9  History:       Patient has no prior history of Echocardiogram examinations.                Sepsis.  Sonographer:   Leeroy Bock Turrentine Referring      [id number] Kateri Mc LATIF [facility] Phys: IMPRESSIONS  1. Left ventricular ejection fraction, by estimation, is 60 to 65%. The left ventricle has normal function. The left ventricle has no regional wall motion abnormalities. Left ventricular diastolic parameters were normal.  2. Right ventricular systolic function is normal. The right ventricular size is normal. Tricuspid regurgitation signal is inadequate for assessing PA pressure.  3. The mitral valve is normal in structure. No evidence of mitral valve  regurgitation. No evidence of mitral stenosis.  4. The aortic valve is normal in structure. Aortic valve regurgitation is not visualized. No aortic stenosis is present.  5. Cannot exclude small independently mobile structure on pulmonary valve seen best in clip 19. Trivial pulmonary valve regurgitation, no pulmonary valve stenosis.  6. The inferior vena cava is normal in size with <50% respiratory variability, suggesting right atrial pressure of 8 mmHg. Conclusion(s)/Recommendation(s): No definite evidence of valvular vegetations on this transthoracic echocardiogram. Would recommend a transesophageal echocardiogram to exclude infective endocarditis if clinically indicated. FINDINGS  Left Ventricle: Left ventricular ejection fraction, by estimation, is 60 to 65%. The left ventricle has normal function. The left ventricle has no regional wall motion abnormalities. The left ventricular internal cavity size was normal in size. There is  no left ventricular hypertrophy. Left ventricular diastolic parameters were normal. Right Ventricle: The right ventricular size is normal. No increase in right ventricular wall thickness. Right ventricular systolic function is normal. Tricuspid regurgitation signal is inadequate for assessing PA pressure. Left Atrium: Left atrial size was normal in size. Right Atrium: Right atrial size was normal in size. Pericardium: Trivial pericardial effusion is present. Mitral Valve: The mitral valve is normal in structure. Normal mobility of the mitral valve leaflets. No evidence of mitral valve regurgitation. No evidence of mitral valve stenosis. Tricuspid Valve: The tricuspid valve is normal in structure. Tricuspid valve regurgitation is not demonstrated. No evidence of tricuspid stenosis. Aortic Valve: The aortic valve is normal in structure. Aortic valve regurgitation is not visualized. No aortic stenosis is present. Pulmonic Valve: Cannot exclude small independently mobile structure on  pulmonary valve seen best in clip 19. Trivial pulmonary valve regurgitation, no pulmonary valve stenosis. The pulmonic valve was normal in structure. Pulmonic valve regurgitation is trivial. No evidence of pulmonic stenosis. Aorta: The aortic root is normal in size and structure. Venous: The inferior vena cava is normal in size with less than 50% respiratory variability, suggesting right atrial pressure of 8 mmHg. IAS/Shunts: No atrial level shunt detected by color flow Doppler.  LEFT VENTRICLE PLAX 2D LVIDd:         4.50 cm  Diastology LVIDs:         2.74 cm  LV e' lateral:   19.70 cm/s LV  PW:         0.90 cm  LV E/e' lateral: 6.7 LV IVS:        0.88 cm  LV e' medial:    13.20 cm/s LVOT diam:     1.80 cm  LV E/e' medial:  10.0 LV SV:         67 LV SV Index:   38 LVOT Area:     2.54 cm  RIGHT VENTRICLE RV S prime:     15.20 cm/s TAPSE (M-mode): 2.6 cm LEFT ATRIUM           Index       RIGHT ATRIUM           Index LA diam:      3.70 cm 2.09 cm/m  RA Area:     16.80 cm LA Vol (A4C): 64.5 ml 36.45 ml/m RA Volume:   42.50 ml  24.02 ml/m  AORTIC VALVE LVOT Vmax:   135.00 cm/s LVOT Vmean:  103.000 cm/s LVOT VTI:    0.264 m  AORTA Ao Root diam: 2.40 cm MITRAL VALVE MV Area (PHT): 5.23 cm     SHUNTS MV Decel Time: 145 msec     Systemic VTI:  0.26 m MV E velocity: 132.00 cm/s  Systemic Diam: 1.80 cm MV A velocity: 76.00 cm/s MV E/A ratio:  1.74 Cherlynn Kaiser MD Electronically signed by Cherlynn Kaiser MD Signature Date/Time: 06/28/2019/2:24:01 PM    Final    DG FLUORO GUIDE LUMBAR PUNCTURE  Result Date: 06/27/2019 CLINICAL DATA:  Fever, headache and history of meningitis. EXAM: DIAGNOSTIC LUMBAR PUNCTURE UNDER FLUOROSCOPIC GUIDANCE FLUOROSCOPY TIME:  Fluoroscopy Time:  0 minutes and 48 seconds Radiation Exposure Index (if provided by the fluoroscopic device): 5 mGy Number of Acquired Spot Images: 0 PROCEDURE: Informed consent was obtained from the patient prior to the procedure, including potential complications  of headache, allergy, and pain. With the patient prone, the lower back was prepped with Betadine. 1% Lidocaine was used for local anesthesia. Lumbar puncture was performed at the L3-4 level using a 20 gauge needle with return of clear CSF with an opening pressure of 34 cm H2O. 13 ml of CSF were obtained for laboratory studies. Closing pressure was 21 cm H2O. The patient tolerated the procedure well and there were no apparent complications. IMPRESSION: Fluoroscopic guided lumbar puncture at L3-4 with clear CSF obtained for appropriate laboratory evaluation. High opening pressure of 34 cm H2O. Electronically Signed   By: Marijo Sanes M.D.   On: 06/27/2019 15:43     Assessment/Plan: Leukocytosis = improving, in part thought to be treated for possible infection 2/2 post urologic procedure. No recent positive cultures during this admission though had 70,000 col MSSA on urine cx prior to procedure(mid April). Fever curve also improving. Will narrow abtx to cefazolin 2gm IV Q 8hr to cover mssa due to lack of finding ESBL pathogens  Intractable headache/migraine = will give another dose of IV compazine/benadryl to stop cycle. Still recommend to stop nsaids. Defer to primary team for her maintenance management of headache. She has temporary relief but recommend to try 100mg  valproic acid to see if that will help  questionable vegetation on TTE = recommend to ask cardiology to take 2nd look at her TTE. It could explain fever, hx of MSSA in urine, though it would not account for CNS symptoms. Spoke with cardiology and unable to say definitively if its TV artifact, recommend to get TEE. I have explained this to the patient.  [facility] Cell: [phone] Pager: [phone]  06/29/2019, 10:50 AM

## 2019-06-30 ENCOUNTER — Other Ambulatory Visit: Payer: Self-pay

## 2019-06-30 ENCOUNTER — Encounter (HOSPITAL_COMMUNITY)
Admission: EM | Disposition: A | Discharge status: Home / Self Care | Payer: Self-pay | Source: Home / Self Care | Attending: Internal Medicine

## 2019-06-30 ENCOUNTER — Inpatient Hospital Stay (HOSPITAL_COMMUNITY): Payer: 59

## 2019-06-30 ENCOUNTER — Inpatient Hospital Stay (HOSPITAL_COMMUNITY): Payer: 59 | Admitting: Certified Registered Nurse Anesthetist

## 2019-06-30 DIAGNOSIS — R7881 Bacteremia: Secondary | ICD-10-CM | POA: Diagnosis not present

## 2019-06-30 DIAGNOSIS — N390 Urinary tract infection, site not specified: Secondary | ICD-10-CM | POA: Diagnosis not present

## 2019-06-30 DIAGNOSIS — A419 Sepsis, unspecified organism: Secondary | ICD-10-CM | POA: Diagnosis not present

## 2019-06-30 DIAGNOSIS — E039 Hypothyroidism, unspecified: Secondary | ICD-10-CM | POA: Diagnosis not present

## 2019-06-30 HISTORY — PX: TEE WITHOUT CARDIOVERSION: SHX5443

## 2019-06-30 LAB — CSF CULTURE W GRAM STAIN: Culture: NO GROWTH

## 2019-06-30 LAB — MAGNESIUM: Magnesium: 2 mg/dL (ref 1.7–2.4)

## 2019-06-30 LAB — COMPREHENSIVE METABOLIC PANEL
ALT: 27 U/L (ref 0–44)
AST: 16 U/L (ref 15–41)
Albumin: 2.7 g/dL — ABNORMAL LOW (ref 3.5–5.0)
Alkaline Phosphatase: 64 U/L (ref 38–126)
Anion gap: 9 (ref 5–15)
BUN: 6 mg/dL (ref 6–20)
CO2: 24 mmol/L (ref 22–32)
Calcium: 8.4 mg/dL — ABNORMAL LOW (ref 8.9–10.3)
Chloride: 107 mmol/L (ref 98–111)
Creatinine, Ser: 0.65 mg/dL (ref 0.44–1.00)
GFR calc Af Amer: >60 mL/min (ref >60)
GFR calc non Af Amer: >60 mL/min (ref >60)
Glucose, Bld: 106 mg/dL — ABNORMAL HIGH (ref 70–99)
Potassium: 3.5 mmol/L (ref 3.5–5.1)
Sodium: 140 mmol/L (ref 135–145)
Total Bilirubin: 0.4 mg/dL (ref 0.3–1.2)
Total Protein: 6.6 g/dL (ref 6.5–8.1)

## 2019-06-30 LAB — CBC WITH DIFFERENTIAL/PLATELET
Abs Immature Granulocytes: 0.12 10*3/uL — ABNORMAL HIGH (ref 0.00–0.07)
Basophils Absolute: 0.1 10*3/uL (ref 0.0–0.1)
Basophils Relative: 1 %
Eosinophils Absolute: 0.1 10*3/uL (ref 0.0–0.5)
Eosinophils Relative: 2 %
HCT: 32.3 % — ABNORMAL LOW (ref 36.0–46.0)
Hemoglobin: 10.1 g/dL — ABNORMAL LOW (ref 12.0–15.0)
Immature Granulocytes: 1 %
Lymphocytes Relative: 26 %
Lymphs Abs: 2.2 10*3/uL (ref 0.7–4.0)
MCH: 29.9 pg (ref 26.0–34.0)
MCHC: 31.3 g/dL (ref 30.0–36.0)
MCV: 95.6 fL (ref 80.0–100.0)
Monocytes Absolute: 0.9 10*3/uL (ref 0.1–1.0)
Monocytes Relative: 11 %
Neutro Abs: 4.9 10*3/uL (ref 1.7–7.7)
Neutrophils Relative %: 59 %
Platelets: 353 10*3/uL (ref 150–400)
RBC: 3.38 MIL/uL — ABNORMAL LOW (ref 3.87–5.11)
RDW: 13.3 % (ref 11.5–15.5)
WBC: 8.4 10*3/uL (ref 4.0–10.5)
nRBC: 0 % (ref 0.0–0.2)

## 2019-06-30 LAB — PHOSPHORUS: Phosphorus: 2.9 mg/dL (ref 2.5–4.6)

## 2019-06-30 SURGERY — ECHOCARDIOGRAM, TRANSESOPHAGEAL
Anesthesia: Monitor Anesthesia Care

## 2019-06-30 MED ORDER — DIPHENHYDRAMINE HCL 50 MG/ML IJ SOLN: 25.0000 mg | Freq: Once | INTRAMUSCULAR | Status: DC

## 2019-06-30 MED ORDER — LACTATED RINGERS IV SOLN
INTRAVENOUS | Status: AC | PRN
  Administered 2019-06-30: 1000 mL via INTRAVENOUS

## 2019-06-30 MED ORDER — LIDOCAINE 2% (20 MG/ML) 5 ML SYRINGE
INTRAMUSCULAR | Status: DC | PRN
  Administered 2019-06-30: 80 mg via INTRAVENOUS

## 2019-06-30 MED ORDER — PROPOFOL 500 MG/50ML IV EMUL
INTRAVENOUS | Status: DC | PRN
  Administered 2019-06-30: 125 ug/kg/min via INTRAVENOUS

## 2019-06-30 MED ORDER — SODIUM CHLORIDE 0.9 % IV SOLN: INTRAVENOUS | Status: DC

## 2019-06-30 MED ORDER — PROCHLORPERAZINE EDISYLATE 10 MG/2ML IJ SOLN
10.0000 mg | Freq: Four times a day (QID) | INTRAMUSCULAR | Status: AC | PRN
  Administered 2019-06-30: 10 mg via INTRAVENOUS
  Filled 2019-06-30: qty 2

## 2019-06-30 MED ORDER — DIPHENHYDRAMINE HCL 50 MG/ML IJ SOLN
25.0000 mg | Freq: Once | INTRAMUSCULAR | Status: AC
  Administered 2019-06-30: 25 mg via INTRAVENOUS
  Filled 2019-06-30: qty 1

## 2019-06-30 MED ORDER — CEFAZOLIN SODIUM-DEXTROSE 2-4 GM/100ML-% IV SOLN
2.0000 g | Freq: Three times a day (TID) | INTRAVENOUS | Status: DC
  Administered 2019-06-30 – 2019-07-01 (×3): 2 g via INTRAVENOUS
  Filled 2019-06-30 (×3): qty 100

## 2019-06-30 MED ORDER — PROPOFOL 10 MG/ML IV BOLUS
INTRAVENOUS | Status: DC | PRN
  Administered 2019-06-30: 20 mg via INTRAVENOUS
  Administered 2019-06-30: 10 mg via INTRAVENOUS

## 2019-06-30 MED ORDER — BUTAMBEN-TETRACAINE-BENZOCAINE 2-2-14 % EX AERO
INHALATION_SPRAY | CUTANEOUS | Status: DC | PRN
  Administered 2019-06-30: 2 via TOPICAL

## 2019-06-30 MED ORDER — LACTATED RINGERS IV SOLN: INTRAVENOUS | Status: DC | PRN

## 2019-06-30 NOTE — Progress Notes (Signed)
  Echocardiogram Echocardiogram Transesophageal has been performed.  Megan Lara  06/30/2019, 11:39 AM

## 2019-06-30 NOTE — Anesthesia Preprocedure Evaluation (Addendum)
Anesthesia Evaluation  Patient identified by MRN, date of birth, ID band Patient awake    Reviewed: Allergy & Precautions, NPO status , Patient's Chart, lab work & pertinent test results  Airway Mallampati: II  TM Distance: >3 FB     Dental   Pulmonary COPD,  COPD inhaler, former smoker,  06/23/2019 SARS coronavirus NEG   breath sounds clear to auscultation       Cardiovascular  Rhythm:Regular Rate:Normal  06/28/2019 ECHO: EF 60-65%   Neuro/Psych Depression    GI/Hepatic   Endo/Other  Hypothyroidism   Renal/GU      Musculoskeletal   Abdominal   Peds  Hematology   Anesthesia Other Findings   Reproductive/Obstetrics                            Anesthesia Physical Anesthesia Plan  ASA: II  Anesthesia Plan: MAC   Post-op Pain Management:    Induction:   PONV Risk Score and Plan: 2  Airway Management Planned: Nasal Cannula and Natural Airway  Additional Equipment:   Intra-op Plan:   Post-operative Plan:   Informed Consent:   Plan Discussed with:   Anesthesia Plan Comments:         Anesthesia Quick Evaluation

## 2019-06-30 NOTE — Progress Notes (Signed)
RN notified pt sinus bradycardia in 50's. HR 50 on right radial on reassessment. Pt denies symptoms. MD notified. Will continue to monitor.

## 2019-06-30 NOTE — Progress Notes (Signed)
[facility]    Date of Admission:  06/25/2019   Total days of antibiotics 6           ID: Megan Lara is a 39 y.o. female with  Principal Problem:   Fever Active Problems:   Hypothyroidism   Leukocytosis   Sepsis (HCC)    Subjective: Afebrile. Headache still present but much improved. TEE negative for vegetation  Medications:  . busPIRone  5 mg Oral BID  . enoxaparin (LOVENOX) injection  40 mg Subcutaneous Q24H  . gabapentin  100 mg Oral QHS  . levothyroxine  75 mcg Oral Q0600  . loratadine  10 mg Oral QHS  . sertraline  100 mg Oral QHS    Objective: Vital signs in last 24 hours: Temp:  [97.9 F (36.6 C)-99.9 F (37.7 C)] 98.1 F (36.7 C) (04/28 1344) Pulse Rate:  [49-68] 61 (04/28 1344) Resp:  [14-20] 18 (04/28 1344) BP: (111-143)/(53-78) 113/74 (04/28 1344) SpO2:  [94 %-100 %] 99 % (04/28 1344) Weight:  [68 kg] 68 kg (04/28 0919)  Physical Exam  Constitutional:  oriented to person, place, and time. appears well-developed and well-nourished. No distress.  HENT: [place]/AT, PERRLA, no scleral icterus Mouth/Throat: Oropharynx is clear and moist. No oropharyngeal exudate.  Cardiovascular: Normal rate, regular rhythm and normal heart sounds. Exam reveals no gallop and no friction rub.  No murmur heard.  Pulmonary/Chest: Effort normal and breath sounds normal. No respiratory distress.  has no wheezes.  Neck = supple, no nuchal rigidity Psychiatric: a normal mood and affect.  behavior is normal.     Lab Results Recent Labs    06/29/19 0551 06/30/19 0534  WBC 12.2* 8.4  HGB 10.0* 10.1*  HCT 33.4* 32.3*  NA 140 140  K 4.6 3.5  CL 109 107  CO2 24 24  BUN 8 6  CREATININE 0.61 0.65   Liver Panel Recent Labs    06/29/19 0551 06/30/19 0534  PROT 6.4* 6.6  ALBUMIN 2.7* 2.7*  AST 15 16  ALT 27 27  ALKPHOS 67 64  BILITOT 0.7 0.4   Sedimentation Rate Recent Labs    06/28/19 0548  ESRSEDRATE 76*   C-Reactive  Protein Recent Labs    06/28/19 0548  CRP 26.3*    Microbiology: reviewed Studies/Results: ECHO TEE  Result Date: 06/30/2019    TRANSESOPHOGEAL ECHO REPORT   Patient Name:   SEPHIRA ZELLMAN Date of Exam: 06/30/2019 Medical Rec #:  [id number]               Height:       66.0 in Accession #:    [id number]              Weight:       149.9 lb Date of Birth:  January 26, 1981               BSA:          1.769 m Patient Age:    38 years                BP:           129/90 mmHg Patient Gender: F                       HR:           59 bpm. Exam Location:  Inpatient Procedure: Transesophageal Echo, Cardiac Doppler and Color Doppler Indications:  Bacteremia 790.7 / R78.81  History:         Patient has prior history of Echocardiogram examinations, most                  recent 06/28/2019. Sepsis. Hypothyroidism.  Sonographer:     Jonelle Sidle Dance Referring Phys:  [phone] JILL D MCDANIEL Diagnosing Phys: Lyman Bishop MD PROCEDURE: The transesophogeal probe was passed without difficulty through the esophogus of the patient. Local oropharyngeal anesthetic was provided with viscous lidocaine. Sedation performed by different physician. The patient was monitored while under deep sedation. Anesthestetic sedation was provided intravenously by Anesthesiology: 174.5mg  of Propofol, 80mg  of Lidocaine. The patient developed no complications during the procedure. IMPRESSIONS  1. Left ventricular ejection fraction, by estimation, is 60 to 65%. The left ventricle has normal function. The left ventricle has no regional wall motion abnormalities. Left ventricular diastolic parameters were normal.  2. Right ventricular systolic function is normal. The right ventricular size is normal.  3. No left atrial/left atrial appendage thrombus was detected.  4. The mitral valve is grossly normal. Trivial mitral valve regurgitation.  5. The aortic valve is tricuspid. Aortic valve regurgitation is not visualized. Conclusion(s)/Recommendation(s): No  evidence of vegetation/infective endocarditis on this transesophageal echocardiogram. FINDINGS  Left Ventricle: Left ventricular ejection fraction, by estimation, is 60 to 65%. The left ventricle has normal function. The left ventricle has no regional wall motion abnormalities. The left ventricular internal cavity size was normal in size. There is  no left ventricular hypertrophy. Left ventricular diastolic parameters were normal. Right Ventricle: The right ventricular size is normal. No increase in right ventricular wall thickness. Right ventricular systolic function is normal. Left Atrium: Left atrial size was normal in size. No left atrial/left atrial appendage thrombus was detected. Right Atrium: Right atrial size was normal in size. Pericardium: There is no evidence of pericardial effusion. Mitral Valve: The mitral valve is grossly normal. Trivial mitral valve regurgitation. Tricuspid Valve: The tricuspid valve is grossly normal. Tricuspid valve regurgitation is trivial. Aortic Valve: The aortic valve is tricuspid. Aortic valve regurgitation is not visualized. Pulmonic Valve: The pulmonic valve was grossly normal. Pulmonic valve regurgitation is trivial. Aorta: The aortic root and ascending aorta are structurally normal, with no evidence of dilitation. Venous: The left upper pulmonary vein and left lower pulmonary vein are normal. IAS/Shunts: No atrial level shunt detected by color flow Doppler.   AORTA Ao Asc diam: 2.70 cm Lyman Bishop MD Electronically signed by Lyman Bishop MD Signature Date/Time: 06/30/2019/11:52:29 AM    Final      Assessment/Plan: SIRS, presumed infection from post urologic procedure = continue on cefazolin for now, and can likely transition to oral to treat as complicated uti/pyelo since lithotripsy. Transition to 5 days of cephalexin 500mg  QID. And follow up with urology  Work up of fever found possible abn TV, which TEE had better visualization to confirm no vegetation/no  endocarditis  Intractable headache/migraine = had responded to iv benadryl/compazine yesterday, today she is feeling. Would give an additional dose now of oral compazine plus benadryl. Consider giving elavil at bedtime daily. Follow up with PCP for chronic headache management  Will sign off  [facility] Cell: [phone] Pager: [phone]  06/30/2019, 2:31 PM

## 2019-06-30 NOTE — H&P (Signed)
   INTERVAL PROCEDURE H&P  History and Physical Interval Note:  06/30/2019 10:02 AM  Megan Lara has presented today for their planned procedure. The various methods of treatment have been discussed with the patient and family. After consideration of risks, benefits and other options for treatment, the patient has consented to the procedure.  The patients' outpatient history has been reviewed, patient examined, and no change in status from most recent office note within the past 30 days. I have reviewed the patients' chart and labs and will proceed as planned. Questions were answered to the patient's satisfaction.   Chrystie Nose, MD, [facility] [employer], FACP  [place]  [facility]  Medical Director of the [facility] Lipid [facility] &  Cardiovascular Risk Reduction Clinic Diplomate of the American Board of Clinical Lipidology Attending Cardiologist  Direct Dial: [phone]  Fax: [phone]  Website:  [email or web address]  06/30/2019, 10:02 AM

## 2019-06-30 NOTE — CV Procedure (Signed)
TRANSESOPHAGEAL ECHOCARDIOGRAM (TEE) NOTE  INDICATIONS: infective endocarditis  PROCEDURE:   Informed consent was obtained prior to the procedure. The risks, benefits and alternatives for the procedure were discussed and the patient comprehended these risks.  Risks include, but are not limited to, cough, sore throat, vomiting, nausea, somnolence, esophageal and stomach trauma or perforation, bleeding, low blood pressure, aspiration, pneumonia, infection, trauma to the teeth and death.    After a procedural time-out, the patient was given propofol per anesthesia for sedation.  The patient's heart rate, blood pressure, and oxygen saturation are monitored continuously during the procedure. The transesophageal probe was inserted in the esophagus and stomach without difficulty and multiple views were obtained.  The patient was kept under observation until the patient left the procedure room.  I was present face-to-face 100% of this time. The patient left the procedure room in stable condition.   Agitated microbubble saline contrast was not administered.  COMPLICATIONS:    There were no immediate complications.  Findings:  1. LEFT VENTRICLE: The left ventricular wall thickness is normal.  The left ventricular cavity is normal in size. Wall motion is normal.  LVEF is 60-65%.  2. RIGHT VENTRICLE:  The right ventricle is normal in structure and function without any thrombus or masses.    3. LEFT ATRIUM:  The left atrium is normal in size without any thrombus or masses.  There is not spontaneous echo contrast ("smoke") in the left atrium consistent with a low flow state.  4. LEFT ATRIAL APPENDAGE:  The left atrial appendage is free of any thrombus or masses. The appendage has single lobes. Pulse doppler indicates high flow in the appendage.  5. ATRIAL SEPTUM:  The atrial septum appears intact and is free of thrombus and/or masses.  There is no evidence for interatrial shunting by color doppler  and saline microbubble.  6. RIGHT ATRIUM:  The right atrium is normal in size and function without any thrombus or masses.  7. MITRAL VALVE:  The mitral valve is normal in structure and function with trivial regurgitation.  There were no vegetations or stenosis.  8. AORTIC VALVE:  The aortic valve is trileaflet, normal in structure and function with no regurgitation.  There were no vegetations or stenosis  9. TRICUSPID VALVE:  The tricuspid valve is normal in structure and function with trivial regurgitation.  There were no vegetations or stenosis  10.  PULMONIC VALVE:  The pulmonic valve is normal in structure and function with trivial regurgitation.  There were no vegetations or stenosis.   11. AORTIC ARCH, ASCENDING AND DESCENDING AORTA:  There was no Myrtis Ser et. Al, 1992) atherosclerosis of the ascending aorta, aortic arch, or proximal descending aorta.  12. PULMONARY VEINS: Anomalous pulmonary venous return was not noted.  13. PERICARDIUM: The pericardium appeared normal and non-thickened.  There is no pericardial effusion.  IMPRESSION:   1. No endocarditis 2. Negative for PFO by color doppler 3. No LAA thrombus 4. LVEF 60-65%, normal wall motion 5. Normal pulmonary vein flow and diastolic function parameters   RECOMMENDATIONS:    1.  Management of bacteremia as per ID recommendations.  Time Spent Directly with the Patient:  45 minutes   Chrystie Nose, MD, [facility], FACP  [place]  [facility]  Medical Director of the [facility] Lipid [facility]  Cardiovascular Risk Reduction Clinic Diplomate of the American Board of Clinical Lipidology Attending Cardiologist  Direct Dial: [phone]  Fax: [phone]  Website:  [email or web address]  Chrystie Nose  06/30/2019, 11:25 AM

## 2019-06-30 NOTE — Anesthesia Postprocedure Evaluation (Signed)
Anesthesia Post Note  Patient: Megan Lara  Procedure(s) Performed: TRANSESOPHAGEAL ECHOCARDIOGRAM (TEE) (N/A )     Patient location during evaluation: PACU Anesthesia Type: MAC Level of consciousness: awake and alert Pain management: pain level controlled Vital Signs Assessment: post-procedure vital signs reviewed and stable Respiratory status: spontaneous breathing, nonlabored ventilation, respiratory function stable and patient connected to nasal cannula oxygen Cardiovascular status: stable and blood pressure returned to baseline Postop Assessment: no apparent nausea or vomiting Anesthetic complications: no    Last Vitals:  Vitals:   06/30/19 1344 06/30/19 1448  BP: 113/74   Pulse: 61 (!) 50  Resp: 18   Temp: 36.7 C   SpO2: 99%     Last Pain:  Vitals:   06/30/19 1349  TempSrc:   PainSc: 6                  Tiajuana Amass

## 2019-06-30 NOTE — Progress Notes (Signed)
PROGRESS NOTE    Megan Lara  LOV:[id number] DOB: 29-Nov-1980 DOA: 06/25/2019 PCP: Mila PalmerWolters, Sharon, MD   Brief Narrative: Megan Lara is a 39 y.o. femalewith medical history significant ofAsthma, Hypothyroidism, depression, right ureteral stone status post extracorporeal shockwave lithotripsy. Patient presented secondary to fevers and chills with concern for infection.   Assessment & Plan:   Principal Problem:   Fever Active Problems:   Hypothyroidism   Leukocytosis   Sepsis (HCC)   Sepsis Present on admission with concern for UTI source in setting of recent urological procedure for ureteral stone. Concern initially for possible meningioencephalitis secondary to headaches/neurological symptoms. Patient initially treated with Ciprofloxacin, then transitioned to Ceftriaxone. With concern for meningioencephalitis, patient transitioned to vancomycin, Cefepime and Acyclovir, subsequently to meropenem and Vancomycin and finally to Cefazolin IV. LP and MRI did not suggests CNS infection. Acyclovir discontinued. Blood cultures without growth. CSF fluid without culture growth. Fever curve trended down in addition to leukocytosis. Transthoracic Echocardiogram and Transesophageal Echocardiogram ruled out vegetation. 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 Patient follows with neurology as an outpatient.  -Compazine and benadryl prn  Hypothyroidism -Continue Synthroid  Asthma -Continue albuterol prn  Depression -Continue Zoloft  Normocytic anemia Stable. No bleeding.  Hypophosphatemia Resolved  Hyperglycemia Hemoglobin A1C of 5.4%  Sinus bradycardia Noted on telemetry. Asymptomatic.   DVT prophylaxis: Lovenox Code Status:   Code Status: Full Code Family Communication: Husband at bedside Disposition Plan: Discharge in 24 hours once transitioned to oral antibiotics   Consultants:    Infectious disease  Procedures:   TRANSTHORACIC ECHOCARDIOGRAM (06/28/2019) IMPRESSIONS    1. Left ventricular ejection fraction, by estimation, is 60 to 65%. The  left ventricle has normal function. The left ventricle has no regional  wall motion abnormalities. Left ventricular diastolic parameters were  normal.  2. Right ventricular systolic function is normal. The right ventricular  size is normal. Tricuspid regurgitation signal is inadequate for assessing  PA pressure.  3. The mitral valve is normal in structure. No evidence of mitral valve  regurgitation. No evidence of mitral stenosis.  4. The aortic valve is normal in structure. Aortic valve regurgitation is  not visualized. No aortic stenosis is present.  5. Cannot exclude small independently mobile structure on pulmonary valve  seen best in clip 19. Trivial pulmonary valve regurgitation, no pulmonary  valve stenosis.  6. The inferior vena cava is normal in size with <50% respiratory  variability, suggesting right atrial pressure of 8 mmHg.   Conclusion(s)/Recommendation(s): No definite evidence of valvular  vegetations on this transthoracic echocardiogram. Would recommend a  transesophageal echocardiogram to exclude infective endocarditis if  clinically indicated.   TRANSESOPHAGEAL ECHOCARDIOGRAM (06/30/2019) IMPRESSIONS    1. Left ventricular ejection fraction, by estimation, is 60 to 65%. The  left ventricle has normal function. The left ventricle has no regional  wall motion abnormalities. Left ventricular diastolic parameters were  normal.  2. Right ventricular systolic function is normal. The right ventricular  size is normal.  3. No left atrial/left atrial appendage thrombus was detected.  4. The mitral valve is grossly normal. Trivial mitral valve  regurgitation.  5. The aortic valve is tricuspid. Aortic valve regurgitation is not  visualized.   Conclusion(s)/Recommendation(s): No evidence of  vegetation/infective  endocarditis on this transesophageal  echocardiogram.  Antimicrobials:  Vancomycin  Meropenem  Ciprofloxacin  Ceftriaxone  Cefepime  Cefazolin  Acyclovir    Subjective: Headache in her occipital area in addition to  bitemporal.   Objective: Vitals:   06/30/19 1138 06/30/19 1148 06/30/19 1344 06/30/19 1448  BP: 130/63 (!) 143/78 113/74   Pulse: (!) 49 (!) 54 61 (!) 50  Resp: 19 20 18    Temp:   98.1 F (36.7 C)   TempSrc:   Oral   SpO2: 100% 94% 99%   Weight:      Height:        Intake/Output Summary (Last 24 hours) at 06/30/2019 1839 Last data filed at 06/30/2019 1751 Gross per 24 hour  Intake 1072 ml  Output --  Net 1072 ml   Filed Weights   06/25/19 2354 06/30/19 0919  Weight: 68 kg 68 kg    Examination:  General exam: Appears calm and comfortable Respiratory system: Clear to auscultation. Respiratory effort normal. Cardiovascular system: S1 & S2 heard, RRR. No murmurs, rubs, gallops or clicks. Gastrointestinal system: Abdomen is nondistended, soft and nontender. No organomegaly or masses felt. Normal bowel sounds heard. Central nervous system: Alert and oriented. No focal neurological deficits. Extremities: No edema. No calf tenderness Skin: No cyanosis. No rashes Psychiatry: Judgement and insight appear normal. Mood & affect appropriate.     Data Reviewed: I have personally reviewed following labs and imaging studies  CBC: Recent Labs  Lab 06/25/19 2231 06/25/19 2231 06/26/19 0113 06/27/19 0656 06/28/19 0548 06/29/19 0551 06/30/19 0534  WBC 11.2*  --   --  25.2* 16.4* 12.2* 8.4  NEUTROABS 9.0*  --   --   --  14.2* 9.4* 4.9  HGB 11.7*   < > 11.9* 9.7* 9.2* 10.0* 10.1*  HCT 36.5   < > 35.0* 31.4* 28.7* 33.4* 32.3*  MCV 96.3  --   --  101.3* 97.3 101.2* 95.6  PLT 309  --   --  244 235 284 353   < > = values in this interval not displayed.   Basic Metabolic Panel: Recent Labs  Lab 06/25/19 2231 06/25/19 2231  06/26/19 0113 06/27/19 0656 06/28/19 0548 06/29/19 0551 06/30/19 0534  NA 132*   < > 131* 140 141 140 140  K 4.0   < > 3.9 3.5 4.0 4.6 3.5  CL 101   < > 100 114* 110 109 107  CO2 23  --   --  18* 24 24 24   GLUCOSE 135*   < > 134* 117* 148* 104* 106*  BUN 6   < > 5* 8 7 8 6   CREATININE 0.77   < > 0.90 0.70 0.49 0.61 0.65  CALCIUM 8.3*  --   --  7.3* 7.8* 8.2* 8.4*  MG  --   --   --  2.0 1.8 1.9 2.0  PHOS  --   --   --   --  2.2* 2.4* 2.9   < > = values in this interval not displayed.   GFR: Estimated Creatinine Clearance: 91 mL/min (by C-G formula based on SCr of 0.65 mg/dL). Liver Function Tests: Recent Labs  Lab 06/25/19 2231 06/27/19 0656 06/28/19 0548 06/29/19 0551 06/30/19 0534  AST 24 24 19 15 16   ALT 24 32 28 27 27   ALKPHOS 56 53 55 67 64  BILITOT 0.8 0.8 0.6 0.7 0.4  PROT 7.3 6.0* 5.0* 6.4* 6.6  ALBUMIN 3.7 2.7* 2.3* 2.7* 2.7*   No results for input(s): LIPASE, AMYLASE in the last 168 hours. No results for input(s): AMMONIA in the last 168 hours. Coagulation Profile: Recent Labs  Lab 06/25/19 2231  INR 1.0   Cardiac  Enzymes: No results for input(s): CKTOTAL, CKMB, CKMBINDEX, TROPONINI in the last 168 hours. BNP (last 3 results) No results for input(s): PROBNP in the last 8760 hours. HbA1C: Recent Labs    06/29/19 0551  HGBA1C 5.4   CBG: No results for input(s): GLUCAP in the last 168 hours. Lipid Profile: No results for input(s): CHOL, HDL, LDLCALC, TRIG, CHOLHDL, LDLDIRECT in the last 72 hours. Thyroid Function Tests: Recent Labs    06/28/19 0548  TSH 2.607   Anemia Panel: Recent Labs    06/28/19 0548  VITAMINB12 1,355*  FOLATE 9.5  FERRITIN 133  TIBC 231*  IRON 9*  RETICCTPCT 1.2   Sepsis Labs: Recent Labs  Lab 06/26/19 0031  LATICACIDVEN 0.8    Recent Results (from the past 240 hour(s))  SARS CORONAVIRUS 2 (TAT 6-24 HRS) Nasopharyngeal Nasopharyngeal Swab     Status: None   Collection Time: 06/23/19 10:08 AM   Specimen:  Nasopharyngeal Swab  Result Value Ref Range Status   SARS Coronavirus 2 NEGATIVE NEGATIVE Final    Comment: (NOTE) SARS-CoV-2 target nucleic acids are NOT DETECTED. The SARS-CoV-2 RNA is generally detectable in upper and lower respiratory specimens during the acute phase of infection. Negative results do not preclude SARS-CoV-2 infection, do not rule out co-infections with other pathogens, and should not be used as the sole basis for treatment or other patient management decisions. Negative results must be combined with clinical observations, patient history, and epidemiological information. The expected result is Negative. Fact Sheet for Patients: HairSlick.no Fact Sheet for Healthcare Providers: quierodirigir.com This test is not yet approved or cleared by the [place] FDA and  has been authorized for detection and/or diagnosis of SARS-CoV-2 by FDA under an Emergency Use Authorization (EUA). This EUA will remain  in effect (meaning this test can be used) for the duration of the COVID-19 declaration under Section 56 4(b)(1) of the Act, 21 U.S.C. section 360bbb-3(b)(1), unless the authorization is terminated or revoked sooner. Performed at [facility], [address], [place], [place] [address]   Culture, blood (Routine x 2)     Status: None (Preliminary result)   Collection Time: 06/25/19 10:31 PM   Specimen: BLOOD  Result Value Ref Range Status   Specimen Description   Final    BLOOD RIGHT HAND Performed at [facility], [address], [place], [place] [address]    Special Requests   Final    BOTTLES DRAWN AEROBIC AND ANAEROBIC Blood Culture results may not be optimal due to an inadequate volume of blood received in culture bottles Performed at [facility], [address] [place], [place] [address]    Culture   Final    NO GROWTH 4 DAYS Performed at [facility], [address] [place], [place] [address]    Report Status PENDING  Incomplete  Culture, blood (Routine x 2)     Status: None (Preliminary result)   Collection Time: 06/25/19 10:36 PM   Specimen: BLOOD RIGHT FOREARM  Result Value Ref Range Status   Specimen Description   Final    BLOOD RIGHT FOREARM Performed at [facility], [address] [place], [place] [address]    Special Requests   Final    BOTTLES DRAWN AEROBIC AND ANAEROBIC Blood Culture adequate volume Performed at [facility], [address], [place], [place] [address]    Culture   Final    NO GROWTH 4 DAYS Performed at  [facility], [address] [place], [place] [address]    Report Status PENDING  Incomplete  Urine culture     Status: Abnormal   Collection Time: 06/26/19 12:55 AM   Specimen: Urine, Random  Result Value Ref Range Status   Specimen Description   Final    URINE, RANDOM Performed at [facility], [address], [place], [place] [address]    Special Requests   Final    NONE Performed at [facility], [address], [place], [place] [address]    Culture (A)  Final    <10,000 COLONIES/mL INSIGNIFICANT GROWTH Performed at [facility], [address], [place], [place] [address]    Report Status 06/27/2019 FINAL  Final  Respiratory Panel by RT PCR (Flu A&B, Covid) - Nasopharyngeal Swab     Status: None   Collection Time: 06/26/19  2:58 AM   Specimen: Nasopharyngeal Swab  Result Value Ref Range Status   SARS Coronavirus 2 by RT PCR NEGATIVE NEGATIVE Final    Comment: (NOTE) SARS-CoV-2 target nucleic acids are NOT DETECTED. The SARS-CoV-2 RNA is generally detectable in upper respiratoy specimens during the acute phase of infection. The lowest concentration of SARS-CoV-2 viral copies this assay can detect is 131 copies/mL. A negative result does not preclude SARS-Cov-2 infection and should not be used as the  sole basis for treatment or other patient management decisions. A negative result may occur with  improper specimen collection/handling, submission of specimen other than nasopharyngeal swab, presence of viral mutation(s) within the areas targeted by this assay, and inadequate number of viral copies (<131 copies/mL). A negative result must be combined with clinical observations, patient history, and epidemiological information. The expected result is Negative. Fact Sheet for Patients:  https://www.moore.com/ Fact Sheet for Healthcare Providers:  https://www.young.biz/ This test is not yet ap proved or cleared by the [place] FDA and  has been authorized for detection and/or diagnosis of SARS-CoV-2 by FDA under an Emergency Use Authorization (EUA). This EUA will remain  in effect (meaning this test can be used) for the duration of the COVID-19 declaration under Section 564(b)(1) of the Act, 21 U.S.C. section 360bbb-3(b)(1), unless the authorization is terminated or revoked sooner.    Influenza A by PCR NEGATIVE NEGATIVE Final   Influenza B by PCR NEGATIVE NEGATIVE Final    Comment: (NOTE) The Xpert Xpress SARS-CoV-2/FLU/RSV assay is intended as an aid in  the diagnosis of influenza from Nasopharyngeal swab specimens and  should not be used as a sole basis for treatment. Nasal washings and  aspirates are unacceptable for Xpert Xpress SARS-CoV-2/FLU/RSV  testing. Fact Sheet for Patients: https://www.moore.com/ Fact Sheet for Healthcare Providers: https://www.young.biz/ This test is not yet approved or cleared by the [place] FDA and  has been authorized for detection and/or diagnosis of SARS-CoV-2 by  FDA under an Emergency Use Authorization (EUA). This EUA will remain  in effect (meaning this test can be used) for the duration of the  Covid-19 declaration under Section 564(b)(1) of the Act, 21    U.S.C. section 360bbb-3(b)(1), unless the authorization is  terminated or revoked. Performed at [facility], [address], [place], [place] [address]   CSF culture     Status: None   Collection Time: 06/27/19  3:00 PM   Specimen: PATH Cytology CSF; Cerebrospinal Fluid  Result Value Ref Range Status   Specimen Description   Final    CSF Performed at [facility], [address] [place], [place] [address]    Special Requests   Final    NONE Performed at [facility], [address], [place], [place] [address]    Gram Stain   Final    WBC PRESENT, PREDOMINANTLY MONONUCLEAR NO ORGANISMS SEEN K.WILSON,RN [id number]  BY V.WILKINS    Culture   Final    NO GROWTH Performed at [facility], [address], [place], [place] [address]    Report Status 06/30/2019 FINAL  Final  Culture, fungus without smear     Status: None (Preliminary result)   Collection Time: 06/27/19  3:00 PM   Specimen: PATH Cytology CSF; Cerebrospinal Fluid  Result Value Ref Range Status   Specimen Description   Final    CSF Performed at [facility], [address] [place], [place] [address]    Special Requests   Final    NONE Performed at [facility], [address], [place], [place] [address]    Culture   Final    NO GROWTH 3 DAYS Performed at [facility], [address] [place], [place] [address]    Report Status PENDING  Incomplete         Radiology Studies: ECHO TEE  Result Date: 06/30/2019    TRANSESOPHOGEAL ECHO REPORT   Patient Name:   JAMARI DIANA Date of Exam: 06/30/2019 Medical Rec #:  [id number]               Height:       66.0 in Accession #:    [id number]              Weight:       149.9 lb Date of Birth:  [birth year]               BSA:          1.769 m Patient Age:    38 years                BP:           129/90 mmHg Patient Gender: F                       HR:            59 bpm. Exam Location:  Inpatient Procedure: Transesophageal Echo, Cardiac Doppler and Color Doppler Indications:     Bacteremia 790.7 / R78.81  History:         Patient has prior history of Echocardiogram examinations, most                  recent 06/28/2019. Sepsis. Hypothyroidism.  Sonographer:     Elmarie Shiley Dance Referring Phys:  [phone] JILL D MCDANIEL Diagnosing Phys: Zoila Shutter MD PROCEDURE: The transesophogeal probe was passed without difficulty through the esophogus of the patient. Local oropharyngeal anesthetic was provided with viscous lidocaine. Sedation performed by different physician. The patient was monitored while under deep sedation. Anesthestetic sedation was provided intravenously by Anesthesiology: 174.  of Propofol,  of Lidocaine. The patient developed no complications during the procedure. IMPRESSIONS  1. Left ventricular ejection fraction, by estimation, is 60 to 65%. The left ventricle has normal function. The left ventricle has no regional wall motion abnormalities. Left ventricular diastolic parameters were normal.  2. Right ventricular systolic function is normal. The right ventricular size is normal.  3. No left atrial/left atrial appendage thrombus was  detected.  4. The mitral valve is grossly normal. Trivial mitral valve regurgitation.  5. The aortic valve is tricuspid. Aortic valve regurgitation is not visualized. Conclusion(s)/Recommendation(s): No evidence of vegetation/infective endocarditis on this transesophageal echocardiogram. FINDINGS  Left Ventricle: Left ventricular ejection fraction, by estimation, is 60 to 65%. The left ventricle has normal function. The left ventricle has no regional wall motion abnormalities. The left ventricular internal cavity size was normal in size. There is  no left ventricular hypertrophy. Left ventricular diastolic parameters were normal. Right Ventricle: The right ventricular size is normal. No increase in right ventricular wall  thickness. Right ventricular systolic function is normal. Left Atrium: Left atrial size was normal in size. No left atrial/left atrial appendage thrombus was detected. Right Atrium: Right atrial size was normal in size. Pericardium: There is no evidence of pericardial effusion. Mitral Valve: The mitral valve is grossly normal. Trivial mitral valve regurgitation. Tricuspid Valve: The tricuspid valve is grossly normal. Tricuspid valve regurgitation is trivial. Aortic Valve: The aortic valve is tricuspid. Aortic valve regurgitation is not visualized. Pulmonic Valve: The pulmonic valve was grossly normal. Pulmonic valve regurgitation is trivial. Aorta: The aortic root and ascending aorta are structurally normal, with no evidence of dilitation. Venous: The left upper pulmonary vein and left lower pulmonary vein are normal. IAS/Shunts: No atrial level shunt detected by color flow Doppler.   AORTA Ao Asc diam: 2.70 cm Zoila Shutter MD Electronically signed by Zoila Shutter MD Signature Date/Time: 06/30/2019/11:52:29 AM    Final         Scheduled Meds: . busPIRone  5 mg Oral BID  . diphenhydrAMINE  25 mg Intravenous Once  . enoxaparin (LOVENOX) injection  40 mg Subcutaneous Q24H  . gabapentin  100 mg Oral QHS  . levothyroxine  75 mcg Oral Q0600  . loratadine  10 mg Oral QHS  . sertraline  100 mg Oral QHS   Continuous Infusions: .  ceFAZolin (ANCEF) IV 2 g (06/30/19 1417)     LOS: 4 days     Jacquelin Hawking, MD [facility] 06/30/2019, 6:39 PM  If 7PM-7AM, please contact night-coverage [email or web address]

## 2019-06-30 NOTE — Transfer of Care (Signed)
Immediate Anesthesia Transfer of Care Note  Patient: Jonda Alanis  Procedure(s) Performed: TRANSESOPHAGEAL ECHOCARDIOGRAM (TEE) (N/A )  Patient Location: Endoscopy Unit  Anesthesia Type:MAC  Level of Consciousness: awake, alert , oriented, patient cooperative and responds to stimulation  Airway & Oxygen Therapy: Patient Spontanous Breathing and Patient connected to nasal cannula oxygen  Post-op Assessment: Report given to RN and Post -op Vital signs reviewed and stable  Post vital signs: Reviewed and stable  Last Vitals:  Vitals Value Taken Time  BP 134/69 06/30/19 1129  Temp 36.6 C 06/30/19 1128  Pulse 50 06/30/19 1130  Resp 18 06/30/19 1130  SpO2 99 % 06/30/19 1130  Vitals shown include unvalidated device data.  Last Pain:  Vitals:   06/30/19 1128  TempSrc: Temporal  PainSc: 0-No pain      Patients Stated Pain Goal: 5 (33/43/56 8616)  Complications: No apparent anesthesia complications

## 2019-06-30 NOTE — Progress Notes (Signed)
This nurse took phone call from [place] in endo at 11:36 AM to give update that patient is doing well, no endocarditis noted, and the patient is waiting for [facility] to come transport patient back to the unit and it could take a couple of hours.

## 2019-07-01 DIAGNOSIS — D72829 Elevated white blood cell count, unspecified: Secondary | ICD-10-CM | POA: Diagnosis not present

## 2019-07-01 DIAGNOSIS — A419 Sepsis, unspecified organism: Secondary | ICD-10-CM | POA: Diagnosis not present

## 2019-07-01 DIAGNOSIS — N390 Urinary tract infection, site not specified: Secondary | ICD-10-CM

## 2019-07-01 DIAGNOSIS — E039 Hypothyroidism, unspecified: Secondary | ICD-10-CM | POA: Diagnosis not present

## 2019-07-01 LAB — CULTURE, BLOOD (ROUTINE X 2)
Culture: NO GROWTH
Culture: NO GROWTH
Special Requests: ADEQUATE

## 2019-07-01 MED ORDER — CEPHALEXIN 500 MG PO CAPS: 500.0000 mg | ORAL_CAPSULE | Freq: Four times a day (QID) | ORAL | 0 refills | Status: AC

## 2019-07-01 MED ORDER — CEPHALEXIN 500 MG PO CAPS
500.0000 mg | ORAL_CAPSULE | Freq: Four times a day (QID) | ORAL | Status: DC
  Administered 2019-07-01: 500 mg via ORAL
  Filled 2019-07-01: qty 1

## 2019-07-01 NOTE — Discharge Summary (Signed)
Physician Discharge Summary  Megan Lara KKX:[id number] DOB: 14-Aug-1980 DOA: 06/25/2019  PCP: Jonathon Jordan, MD  Admit date: 06/25/2019 Discharge date: 07/01/2019  Admitted From: Home Disposition: Home  Recommendations for Outpatient Follow-up:  1. Follow up with PCP in 1 week 2. Follow up with neurology for headaches 3. Please follow up on the following pending results: None  Home Health: None Equipment/Devices: None  Discharge Condition: Stable CODE STATUS: Full code Diet recommendation: Regular   Brief/Interim Summary:  Admission HPI written by Aline August, MD   Chief Complaint: Fever  HPI: Megan Lara is a 39 y.o. female with medical history significant of Asthma,  Hypothyroidism, depression, right ureteral stone status post extracorporeal shockwave lithotripsy on 06/24/2019 by Dr. Ottelin/urology presented with fever since yesterday.  Patient is having high-grade fever with chills since yesterday but denies worsening cough, shortness of breath, chest pain, vomiting, abdominal pain, diarrhea.  She denies any increased frequency of urination or dysuria but complains of some flank pain along with nausea.  She has very poor appetite.  Denies loss of consciousness, seizures, rash.  Has not had her Covid vaccine yet.  ED Course: Patient was found to be febrile, tachycardic with mild leukocytosis.  She was started on IV fluids and antibiotics.  CT of the abdomen and pelvis with contrast showed postprocedural changes of recent lithotripsy with trace amount of right perinephric fluid and soft tissue stranding but no obstructing stone.  Chest x-ray was negative for infiltrates.    Hospital course:  Sepsis Complicated UTI Present on admission with concern for UTI source in setting of recent urological procedure for ureteral stone. Concern initially for possible meningioencephalitis secondary to headaches/neurological symptoms. Patient initially treated  with Ciprofloxacin, then transitioned to Ceftriaxone. With concern for meningioencephalitis, patient transitioned to vancomycin, Cefepime and Acyclovir, subsequently to meropenem and Vancomycin and finally to Cefazolin IV. LP and MRI did not suggests CNS infection. Acyclovir discontinued. Blood cultures without growth. CSF fluid without culture growth. Fever curve trended down in addition to leukocytosis. Transthoracic Echocardiogram and Transesophageal Echocardiogram ruled out vegetation. Infectious disease recommended to discharge on Keflex for 5 additional days to cover previously positive urine culture (4/12) significant for MSSA. PCP follow-up.  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 Patient follows with neurology as an outpatient. Compazine and benadryl prn helped. Improved prior to discharge. Outpatient follow-up.  Hypothyroidism Continue Synthroid  Asthma Continue albuterol prn  Depression Continue Zoloft  Normocytic anemia Stable. No bleeding.  Hypophosphatemia Resolved  Hyperglycemia Hemoglobin A1C of 5.4%  Sinus bradycardia Noted on telemetry. Asymptomatic. Discussed with cardiology who recommended no outpatient follow-up unless she is symptomatic.  Discharge Diagnoses:  Principal Problem:   Fever Active Problems:   Hypothyroidism   Leukocytosis   Sepsis ([address])    Discharge Instructions   Allergies as of 07/01/2019   No Known Allergies     Medication List    STOP taking these medications   ibuprofen 800 MG tablet Commonly known as: ADVIL   ketorolac 10 MG tablet Commonly known as: TORADOL     TAKE these medications   acetaminophen 500 MG tablet Commonly known as: TYLENOL Take 1,000 mg by mouth every 6 (six) hours as needed for mild pain, moderate pain or headache.   albuterol 108 (90 Base) MCG/ACT inhaler Commonly known as: VENTOLIN HFA Inhale 2 puffs into the lungs every 6 (six) hours as needed for wheezing.   busPIRone 5  MG tablet Commonly known as: BUSPAR Take 5 mg by  mouth 2 (two) times daily.   cephALEXin 500 MG capsule Commonly known as: KEFLEX Take 1 capsule (500 mg total) by mouth every 6 (six) hours for 5 days.   cetirizine 10 MG tablet Commonly known as: ZYRTEC Take 10 mg by mouth at bedtime.   gabapentin 100 MG capsule Commonly known as: Neurontin Take 1 capsule (100 mg total) by mouth at bedtime as needed. What changed: when to take this   levothyroxine 75 MCG tablet Commonly known as: SYNTHROID Take 75 mcg by mouth every other day. What changed: Another medication with the same name was removed. Continue taking this medication, and follow the directions you see here.   ondansetron 8 MG disintegrating tablet Commonly known as: Zofran ODT Take 1 tablet (8 mg total) by mouth every 8 (eight) hours as needed for nausea or vomiting.   oxyCODONE-acetaminophen 5-325 MG tablet Commonly known as: PERCOCET/ROXICET Take 2 tablets by mouth every 6 (six) hours as needed for severe pain.   sertraline 100 MG tablet Commonly known as: ZOLOFT Take 100 mg by mouth at bedtime.   tamsulosin 0.4 MG Caps capsule Commonly known as: Flomax Take 1 capsule (0.4 mg total) by mouth daily. Take until kidney stone passes.       No Known Allergies  Consultations:  Infectious disease   Procedures/Studies: DG Chest 2 View  Result Date: 06/25/2019 CLINICAL DATA:  39 year old female with suspected sepsis. EXAM: CHEST - 2 VIEW COMPARISON:  Chest radiograph dated 03/23/2019. FINDINGS: The heart size and mediastinal contours are within normal limits. Both lungs are clear. The visualized skeletal structures are unremarkable. IMPRESSION: No active cardiopulmonary disease. Electronically Signed   By: Elgie CollardArash  Radparvar M.D.   On: 06/25/2019 22:48   DG Abd 1 View - KUB  Result Date: 06/24/2019 CLINICAL DATA:  Right kidney stone. EXAM: ABDOMEN - 1 VIEW COMPARISON:  June 18, 2019. June 22, 2019. FINDINGS: The  bowel gas pattern is normal. Rounded calculus is seen to the right of the L3-4 disc space which corresponds to ureteral calculus noted on prior CT. No definite nephrolithiasis is noted. IMPRESSION: Right ureteral calculus is noted. No evidence of bowel obstruction or ileus. Electronically Signed   By: Lupita RaiderJames  Green Jr M.D.   On: 06/24/2019 10:15   MR BRAIN W WO CONTRAST  Result Date: 06/27/2019 CLINICAL DATA:  Chronic headache.  History of meningitis in January. EXAM: MRI HEAD WITHOUT AND WITH CONTRAST TECHNIQUE: Multiplanar, multiecho pulse sequences of the brain and surrounding structures were obtained without and with intravenous contrast. CONTRAST:  7mL GADAVIST GADOBUTROL 1 MMOL/ML IV SOLN COMPARISON:  04/27/2019 FINDINGS: Brain: The brain has a normal appearance without evidence of malformation, atrophy, old or acute small or large vessel infarction, mass lesion, hemorrhage, hydrocephalus or extra-axial collection. After contrast administration, no abnormal enhancement occurs. Vascular: Major vessels at the base of the brain show flow. Venous sinuses appear patent. Skull and upper cervical spine: Normal. Sinuses/Orbits: Clear/normal. Other: None significant. IMPRESSION: Normal examination. No evidence of active meningitis. No old insult evident by MRI. Electronically Signed   By: Paulina FusiMark  Shogry M.D.   On: 06/27/2019 14:14   CT ABDOMEN PELVIS W CONTRAST  Result Date: 06/26/2019 CLINICAL DATA:  39 year old female with history of lithotripsy on Thursday. Postprocedural pain and fever. EXAM: CT ABDOMEN AND PELVIS WITH CONTRAST TECHNIQUE: Multidetector CT imaging of the abdomen and pelvis was performed using the standard protocol following bolus administration of intravenous contrast. CONTRAST:  100mL OMNIPAQUE IOHEXOL 300 MG/ML  SOLN COMPARISON:  CT the abdomen and pelvis 06/18/2019. FINDINGS: Lower chest: Trace volume of right pleural fluid and minimal subsegmental atelectasis in the base of the right lower  lobe. Hepatobiliary: 1.3 x 0.8 cm low-attenuation area in segment 6 of the liver (axial image 46 of series 6). No other suspicious hepatic lesions. No intra or extrahepatic biliary ductal dilatation. Gallbladder is normal in appearance. Pancreas: No pancreatic mass. No pancreatic ductal dilatation. No pancreatic or peripancreatic fluid collections or inflammatory changes. Spleen: Unremarkable. Adrenals/Urinary Tract: Left kidney and bilateral adrenal glands are normal in appearance. Previously noted calculus in the proximal third of the right ureter is not identified. No calculus fragments are identified along the course of the right ureter or within the lumen of the urinary bladder. Mild hypoenhancement of the right kidney when compared to the contralateral side, which may reflect some resolving parenchymal edema in the setting of recent lithotripsy. Trace amount of perinephric fluid and soft tissue stranding, presumably related to recent lithotripsy. No well-defined perinephric fluid collection. Specifically, no evidence of subcapsular hematoma. Urinary bladder is normal in appearance. Stomach/Bowel: Normal appearance of the stomach. No pathologic dilatation of small bowel or colon. Normal appendix. Vascular/Lymphatic: No significant atherosclerotic disease, aneurysm or dissection noted in the abdominal or pelvic vasculature. No lymphadenopathy noted in the abdomen or pelvis. Reproductive: Uterus and ovaries are unremarkable in appearance. Other: No significant volume of ascites.  No pneumoperitoneum. Musculoskeletal: There are no aggressive appearing lytic or blastic lesions noted in the visualized portions of the skeleton. IMPRESSION: 1. Postprocedural changes of recent lithotripsy with trace amount of right perinephric fluid and soft tissue stranding, mild edema in the right kidney, and what appears to be a very small contusion of the inferior aspect of segment 6 of the liver adjacent to the inferior pole of  the right kidney. No subcapsular hematoma or other significant acute complicating features. Previously noted right proximal ureteral calculus is no longer identified. No calculus fragments along the course of the right kidney or within the lumen of the urinary bladder. 2. Trace right pleural effusion with minimal subsegmental atelectasis in the dependent right lower lobe. Electronically Signed   By: Trudie Reed M.D.   On: 06/26/2019 05:52   ECHOCARDIOGRAM COMPLETE  Result Date: 06/28/2019    ECHOCARDIOGRAM REPORT   Patient Name:   YARITHZA MINK Date of Exam: 06/28/2019 Medical Rec #:  [id number]               Height:       66.0 in Accession #:    [id number]              Weight:       150.0 lb Date of Birth:  1981/01/14               BSA:          1.770 m Patient Age:    38 years                BP:           109/71 mmHg Patient Gender: F                       HR:           77 bpm. Exam Location:  Inpatient Procedure: 2D Echo Indications:   Fever 780.6 / R50.9  History:       Patient has no prior history of Echocardiogram examinations.  Sepsis.  Sonographer:   Leeroy Bock Turrentine Referring      [id number] Kateri Mc LATIF [facility] Phys: IMPRESSIONS  1. Left ventricular ejection fraction, by estimation, is 60 to 65%. The left ventricle has normal function. The left ventricle has no regional wall motion abnormalities. Left ventricular diastolic parameters were normal.  2. Right ventricular systolic function is normal. The right ventricular size is normal. Tricuspid regurgitation signal is inadequate for assessing PA pressure.  3. The mitral valve is normal in structure. No evidence of mitral valve regurgitation. No evidence of mitral stenosis.  4. The aortic valve is normal in structure. Aortic valve regurgitation is not visualized. No aortic stenosis is present.  5. Cannot exclude small independently mobile structure on pulmonary valve seen best in clip 19. Trivial pulmonary valve regurgitation, no  pulmonary valve stenosis.  6. The inferior vena cava is normal in size with <50% respiratory variability, suggesting right atrial pressure of 8 mmHg. Conclusion(s)/Recommendation(s): No definite evidence of valvular vegetations on this transthoracic echocardiogram. Would recommend a transesophageal echocardiogram to exclude infective endocarditis if clinically indicated. FINDINGS  Left Ventricle: Left ventricular ejection fraction, by estimation, is 60 to 65%. The left ventricle has normal function. The left ventricle has no regional wall motion abnormalities. The left ventricular internal cavity size was normal in size. There is  no left ventricular hypertrophy. Left ventricular diastolic parameters were normal. Right Ventricle: The right ventricular size is normal. No increase in right ventricular wall thickness. Right ventricular systolic function is normal. Tricuspid regurgitation signal is inadequate for assessing PA pressure. Left Atrium: Left atrial size was normal in size. Right Atrium: Right atrial size was normal in size. Pericardium: Trivial pericardial effusion is present. Mitral Valve: The mitral valve is normal in structure. Normal mobility of the mitral valve leaflets. No evidence of mitral valve regurgitation. No evidence of mitral valve stenosis. Tricuspid Valve: The tricuspid valve is normal in structure. Tricuspid valve regurgitation is not demonstrated. No evidence of tricuspid stenosis. Aortic Valve: The aortic valve is normal in structure. Aortic valve regurgitation is not visualized. No aortic stenosis is present. Pulmonic Valve: Cannot exclude small independently mobile structure on pulmonary valve seen best in clip 19. Trivial pulmonary valve regurgitation, no pulmonary valve stenosis. The pulmonic valve was normal in structure. Pulmonic valve regurgitation is trivial. No evidence of pulmonic stenosis. Aorta: The aortic root is normal in size and structure. Venous: The inferior vena cava is  normal in size with less than 50% respiratory variability, suggesting right atrial pressure of 8 mmHg. IAS/Shunts: No atrial level shunt detected by color flow Doppler.  LEFT VENTRICLE PLAX 2D LVIDd:         4.50 cm  Diastology LVIDs:         2.74 cm  LV e' lateral:   19.70 cm/s LV PW:         0.90 cm  LV E/e' lateral: 6.7 LV IVS:        0.88 cm  LV e' medial:    13.20 cm/s LVOT diam:     1.80 cm  LV E/e' medial:  10.0 LV SV:         67 LV SV Index:   38 LVOT Area:     2.54 cm  RIGHT VENTRICLE RV S prime:     15.20 cm/s TAPSE (M-mode): 2.6 cm LEFT ATRIUM           Index       RIGHT ATRIUM  Index LA diam:      3.70 cm 2.09 cm/m  RA Area:     16.80 cm LA Vol (A4C): 64.5 ml 36.45 ml/m RA Volume:   42.50 ml  24.02 ml/m  AORTIC VALVE LVOT Vmax:   135.00 cm/s LVOT Vmean:  103.000 cm/s LVOT VTI:    0.264 m  AORTA Ao Root diam: 2.40 cm MITRAL VALVE MV Area (PHT): 5.23 cm     SHUNTS MV Decel Time: 145 msec     Systemic VTI:  0.26 m MV E velocity: 132.00 cm/s  Systemic Diam: 1.80 cm MV A velocity: 76.00 cm/s MV E/A ratio:  1.74 Weston Brass MD Electronically signed by Weston Brass MD Signature Date/Time: 06/28/2019/2:24:01 PM    Final    CT Renal Stone Study  Result Date: 06/18/2019 CLINICAL DATA:  Right-sided abdominal pain, nausea for 4 days EXAM: CT ABDOMEN AND PELVIS WITHOUT CONTRAST TECHNIQUE: Multidetector CT imaging of the abdomen and pelvis was performed following the standard protocol without IV contrast. COMPARISON:  05/28/2018 FINDINGS: Lower chest: No acute pleural or parenchymal lung disease. Hepatobiliary: No focal liver abnormality is seen. No gallstones, gallbladder wall thickening, or biliary dilatation. Pancreas: Unremarkable. No pancreatic ductal dilatation or surrounding inflammatory changes. Spleen: Normal in size without focal abnormality. Adrenals/Urinary Tract: Moderate right-sided obstructive uropathy is identified, related to a 5 mm obstructing proximal right ureteral  calculus, reference image 40. The left kidney is unremarkable. Bladder is normal. Adrenal glands are unremarkable. Stomach/Bowel: No bowel obstruction or ileus. Normal retrocecal appendix. No bowel wall thickening or inflammatory change. Vascular/Lymphatic: No significant vascular findings are present. No enlarged abdominal or pelvic lymph nodes. Reproductive: Uterus and bilateral adnexa are unremarkable. Other: Trace pelvic free fluid is likely physiologic. No free gas. Embolic coils are seen within the left rectus sheath. Musculoskeletal: No acute or destructive bony lesions. Reconstructed images demonstrate no additional findings. IMPRESSION: 1. Right-sided obstructive uropathy due to a 5 mm proximal right ureteral calculus. Electronically Signed   By: Sharlet Salina M.D.   On: 06/18/2019 02:02   ECHO TEE  Result Date: 06/30/2019    TRANSESOPHOGEAL ECHO REPORT   Patient Name:   TYANNE DEROCHER Date of Exam: 06/30/2019 Medical Rec #:  [id number]               Height:       66.0 in Accession #:    [id number]              Weight:       149.9 lb Date of Birth:  17-Sep-1980               BSA:          1.769 m Patient Age:    38 years                BP:           129/90 mmHg Patient Gender: F                       HR:           59 bpm. Exam Location:  Inpatient Procedure: Transesophageal Echo, Cardiac Doppler and Color Doppler Indications:     Bacteremia 790.7 / R78.81  History:         Patient has prior history of Echocardiogram examinations, most                  recent 06/28/2019. Sepsis. Hypothyroidism.  Sonographer:  Tiffany Dance Referring Phys:  [phone] Deri Fuelling MCDANIEL Diagnosing Phys: Zoila Shutter MD PROCEDURE: The transesophogeal probe was passed without difficulty through the esophogus of the patient. Local oropharyngeal anesthetic was provided with viscous lidocaine. Sedation performed by different physician. The patient was monitored while under deep sedation. Anesthestetic sedation was provided  intravenously by Anesthesiology: 174.5mg  of Propofol, 80mg  of Lidocaine. The patient developed no complications during the procedure. IMPRESSIONS  1. Left ventricular ejection fraction, by estimation, is 60 to 65%. The left ventricle has normal function. The left ventricle has no regional wall motion abnormalities. Left ventricular diastolic parameters were normal.  2. Right ventricular systolic function is normal. The right ventricular size is normal.  3. No left atrial/left atrial appendage thrombus was detected.  4. The mitral valve is grossly normal. Trivial mitral valve regurgitation.  5. The aortic valve is tricuspid. Aortic valve regurgitation is not visualized. Conclusion(s)/Recommendation(s): No evidence of vegetation/infective endocarditis on this transesophageal echocardiogram. FINDINGS  Left Ventricle: Left ventricular ejection fraction, by estimation, is 60 to 65%. The left ventricle has normal function. The left ventricle has no regional wall motion abnormalities. The left ventricular internal cavity size was normal in size. There is  no left ventricular hypertrophy. Left ventricular diastolic parameters were normal. Right Ventricle: The right ventricular size is normal. No increase in right ventricular wall thickness. Right ventricular systolic function is normal. Left Atrium: Left atrial size was normal in size. No left atrial/left atrial appendage thrombus was detected. Right Atrium: Right atrial size was normal in size. Pericardium: There is no evidence of pericardial effusion. Mitral Valve: The mitral valve is grossly normal. Trivial mitral valve regurgitation. Tricuspid Valve: The tricuspid valve is grossly normal. Tricuspid valve regurgitation is trivial. Aortic Valve: The aortic valve is tricuspid. Aortic valve regurgitation is not visualized. Pulmonic Valve: The pulmonic valve was grossly normal. Pulmonic valve regurgitation is trivial. Aorta: The aortic root and ascending aorta are  structurally normal, with no evidence of dilitation. Venous: The left upper pulmonary vein and left lower pulmonary vein are normal. IAS/Shunts: No atrial level shunt detected by color flow Doppler.   AORTA Ao Asc diam: 2.70 cm MD Electronically signed by MD Signature Date/Time: 06/30/2019/11:52:29 AM    Final    DG FLUORO GUIDE LUMBAR PUNCTURE  Result Date: 06/27/2019 CLINICAL DATA:  Fever, headache and history of meningitis. EXAM: DIAGNOSTIC LUMBAR PUNCTURE UNDER FLUOROSCOPIC GUIDANCE FLUOROSCOPY TIME:  Fluoroscopy Time:  0 minutes and 48 seconds Radiation Exposure Index (if provided by the fluoroscopic device): 5 mGy Number of Acquired Spot Images: 0 PROCEDURE: Informed consent was obtained from the patient prior to the procedure, including potential complications of headache, allergy, and pain. With the patient prone, the lower back was prepped with Betadine. 1% Lidocaine was used for local anesthesia. Lumbar puncture was performed at the L3-4 level using a 20 gauge needle with return of clear CSF with an opening pressure of 34 cm H2O. 13 ml of CSF were obtained for laboratory studies. Closing pressure was 21 cm H2O. The patient tolerated the procedure well and there were no apparent complications. IMPRESSION: Fluoroscopic guided lumbar puncture at L3-4 with clear CSF obtained for appropriate laboratory evaluation. High opening pressure of 34 cm H2O. Electronically Signed   By: 07/02/2019 M.D.   On: 06/27/2019 15:43      TRANSTHORACIC ECHOCARDIOGRAM (06/28/2019) IMPRESSIONS    1. Left ventricular ejection fraction, by estimation, is 60 to 65%. The  left ventricle has normal function. The  left ventricle has no regional  wall motion abnormalities. Left ventricular diastolic parameters were  normal.  2. Right ventricular systolic function is normal. The right ventricular  size is normal. Tricuspid regurgitation signal is inadequate for assessing  PA pressure.  3.  The mitral valve is normal in structure. No evidence of mitral valve  regurgitation. No evidence of mitral stenosis.  4. The aortic valve is normal in structure. Aortic valve regurgitation is  not visualized. No aortic stenosis is present.  5. Cannot exclude small independently mobile structure on pulmonary valve  seen best in clip 19. Trivial pulmonary valve regurgitation, no pulmonary  valve stenosis.  6. The inferior vena cava is normal in size with <50% respiratory  variability, suggesting right atrial pressure of 8 mmHg.   Conclusion(s)/Recommendation(s): No definite evidence of valvular  vegetations on this transthoracic echocardiogram. Would recommend a  transesophageal echocardiogram to exclude infective endocarditis if  clinically indicated.   TRANSESOPHAGEAL ECHOCARDIOGRAM (06/30/2019) IMPRESSIONS    1. Left ventricular ejection fraction, by estimation, is 60 to 65%. The  left ventricle has normal function. The left ventricle has no regional  wall motion abnormalities. Left ventricular diastolic parameters were  normal.  2. Right ventricular systolic function is normal. The right ventricular  size is normal.  3. No left atrial/left atrial appendage thrombus was detected.  4. The mitral valve is grossly normal. Trivial mitral valve  regurgitation.  5. The aortic valve is tricuspid. Aortic valve regurgitation is not  visualized.   Conclusion(s)/Recommendation(s): No evidence of vegetation/infective  endocarditis on this transesophageal  echocardiogram.   Subjective: Headache improved. No other issues  Discharge Exam: Vitals:   06/30/19 2034 07/01/19 0500  BP: (!) 99/58 117/69  Pulse: 71 60  Resp: 16 17  Temp: 98.9 F (37.2 C) 98.2 F (36.8 C)  SpO2: 96% 96%   Vitals:   06/30/19 1344 06/30/19 1448 06/30/19 2034 07/01/19 0500  BP: 113/74  (!) 99/58 117/69  Pulse: 61 (!) 50 71 60  Resp: Temp: 98.1 F (36.7 C)  98.9 F (37.2 C) 98.2 F  (36.8 C)  TempSrc: Oral  Oral Oral  SpO2: 99%  96% 96%  Weight:      Height:        General: Pt is alert, awake, not in acute distress Cardiovascular: RRR, S1/S2 +, no rubs, no gallops Respiratory: CTA bilaterally, no wheezing, no rhonchi Abdominal: Soft, NT, ND, bowel sounds + Extremities: no edema, no cyanosis    The results of significant diagnostics from this hospitalization (including imaging, microbiology, ancillary and laboratory) are listed below for reference.     Microbiology: Recent Results (from the past 240 hour(s))  SARS CORONAVIRUS 2 (TAT 6-24 HRS) Nasopharyngeal Nasopharyngeal Swab     Status: None   Collection Time: 06/23/19 10:08 AM   Specimen: Nasopharyngeal Swab  Result Value Ref Range Status   SARS Coronavirus 2 NEGATIVE NEGATIVE Final    Comment: (NOTE) SARS-CoV-2 target nucleic acids are NOT DETECTED. The SARS-CoV-2 RNA is generally detectable in upper and lower respiratory specimens during the acute phase of infection. Negative results do not preclude SARS-CoV-2 infection, do not rule out co-infections with other pathogens, and should not be used as the sole basis for treatment or other patient management decisions. Negative results must be combined with clinical observations, patient history, and epidemiological information. The expected result is Negative. Fact Sheet for Patients: HairSlick.no Fact Sheet for Healthcare Providers: quierodirigir.com This test is not yet approved or  cleared by the [place] and  has been authorized for detection and/or diagnosis of SARS-CoV-2 by FDA under an Emergency Use Authorization (EUA). This EUA will remain  in effect (meaning this test can be used) for the duration of the COVID-19 declaration under Section 56 4(b)(1) of the Act, 21 U.S.C. section 360bbb-3(b)(1), unless the authorization is terminated or revoked sooner. Performed at [facility], [address], [place], [place] [address]   Culture, blood (Routine x 2)     Status: None (Preliminary result)   Collection Time: 06/25/19 10:31 PM   Specimen: BLOOD  Result Value Ref Range Status   Specimen Description   Final    BLOOD RIGHT HAND Performed at [facility], [address], [place], [place] [address]    Special Requests   Final    BOTTLES DRAWN AEROBIC AND ANAEROBIC Blood Culture results may not be optimal due to an inadequate volume of blood received in culture bottles Performed at [facility], [address] [place], [place] [address]    Culture   Final    NO GROWTH 4 DAYS Performed at [facility], [address], [place], [place] [address]    Report Status PENDING  Incomplete  Culture, blood (Routine x 2)     Status: None (Preliminary result)   Collection Time: 06/25/19 10:36 PM   Specimen: BLOOD RIGHT FOREARM  Result Value Ref Range Status   Specimen Description   Final    BLOOD RIGHT FOREARM Performed at [facility], [address] [place], [place] [address]    Special Requests   Final    BOTTLES DRAWN AEROBIC AND ANAEROBIC Blood Culture adequate volume Performed at [facility], [address], [place], [place] [address]    Culture   Final    NO GROWTH 4 DAYS Performed at [facility], [address] [place], [place] [address]    Report Status PENDING  Incomplete  Urine culture     Status: Abnormal   Collection Time: 06/26/19 12:55 AM   Specimen: Urine, Random  Result Value Ref Range Status   Specimen Description   Final    URINE, RANDOM Performed at [facility], [address] [place], [place] [address]    Special Requests   Final    NONE Performed at [facility], [address], [place], [place] [address]    Culture (A)  Final    <10,000 COLONIES/mL INSIGNIFICANT GROWTH Performed at [facility], [address], [place], [place] [address]    Report Status 06/27/2019 FINAL  Final  Respiratory Panel by RT PCR (Flu A&B, Covid) - Nasopharyngeal Swab     Status: None   Collection Time: 06/26/19  2:58 AM   Specimen: Nasopharyngeal Swab  Result Value Ref Range Status   SARS Coronavirus 2 by RT PCR NEGATIVE NEGATIVE Final    Comment: (NOTE) SARS-CoV-2 target nucleic acids are NOT DETECTED. The SARS-CoV-2 RNA is generally detectable in upper respiratoy specimens during the acute phase of infection. The lowest concentration of SARS-CoV-2 viral copies this assay can detect is 131 copies/mL. A negative result does not preclude SARS-Cov-2 infection and should not be used as the sole basis for treatment or other patient management decisions. A negative result may occur with  improper specimen collection/handling, submission of specimen other than nasopharyngeal swab, presence of viral mutation(s) within the areas targeted by this  assay, and inadequate number of viral copies (<131 copies/mL). A negative result must be combined with clinical observations, patient history, and epidemiological information. The expected result is Negative. Fact Sheet for Patients:  https://www.moore.com/ Fact Sheet for Healthcare Providers:  https://www.young.biz/ This test is not yet ap proved or cleared by the [place] FDA and  has been authorized for detection and/or diagnosis of SARS-CoV-2 by FDA under an Emergency Use Authorization (EUA). This EUA will remain  in effect (meaning this test can be used) for the duration of the COVID-19 declaration under Section 564(b)(1) of the Act, 21 U.S.C. section 360bbb-3(b)(1), unless the authorization is terminated or revoked sooner.    Influenza A by PCR NEGATIVE NEGATIVE Final   Influenza B by PCR NEGATIVE NEGATIVE Final    Comment: (NOTE) The Xpert Xpress SARS-CoV-2/FLU/RSV assay is intended as an aid in  the  diagnosis of influenza from Nasopharyngeal swab specimens and  should not be used as a sole basis for treatment. Nasal washings and  aspirates are unacceptable for Xpert Xpress SARS-CoV-2/FLU/RSV  testing. Fact Sheet for Patients: https://www.moore.com/ Fact Sheet for Healthcare Providers: https://www.young.biz/ This test is not yet approved or cleared by the [place] FDA and  has been authorized for detection and/or diagnosis of SARS-CoV-2 by  FDA under an Emergency Use Authorization (EUA). This EUA will remain  in effect (meaning this test can be used) for the duration of the  Covid-19 declaration under Section 564(b)(1) of the Act, 21  U.S.C. section 360bbb-3(b)(1), unless the authorization is  terminated or revoked. Performed at [facility], [address] [place], [place] [address]   CSF culture     Status: None   Collection Time: 06/27/19  3:00 PM   Specimen: PATH Cytology CSF; Cerebrospinal Fluid  Result Value Ref Range Status   Specimen Description   Final    CSF Performed at [facility], [address], [place], [place] [address]    Special Requests   Final    NONE Performed at [facility], [address] [place], [place] [address]    Gram Stain   Final    WBC PRESENT, PREDOMINANTLY MONONUCLEAR NO ORGANISMS SEEN K.WILSON,RN [id number] @1719  BY V.WILKINS    Culture   Final    NO GROWTH Performed at [facility], [address], [place], [place] [address]    Report Status 06/30/2019 FINAL  Final  Culture, fungus without smear     Status: None (Preliminary result)   Collection Time: 06/27/19  3:00 PM   Specimen: PATH Cytology CSF; Cerebrospinal Fluid  Result Value Ref Range Status   Specimen Description   Final    CSF Performed at [facility], [address], [place], [place] [address]    Special Requests   Final    NONE Performed at  [facility], [address], [place], [place] [address]    Culture   Final    NO GROWTH 3 DAYS Performed at [facility], [address], [place], [place] [address]    Report Status PENDING  Incomplete     Labs: BNP (last 3 results) No results for input(s): BNP in the last 8760 hours. Basic Metabolic Panel: Recent Labs  Lab 06/25/19 2231 06/25/19 2231 06/26/19 0113 06/27/19 0656 06/28/19 0548 06/29/19 0551 06/30/19 0534  NA 132*   < > 131* 140 141 140 140  K 4.0   < > 3.9 3.5 4.0 4.6  3.5  CL 101   < > 100 114* 110 109 107  CO2 23  --   --  18* 24 24 24   GLUCOSE 135*   < > 134* 117* 148* 104* 106*  BUN 6   < > 5* 8 7 8 6   CREATININE 0.77   < > 0.90 0.70 0.49 0.61 0.65  CALCIUM 8.3*  --   --  7.3* 7.8* 8.2* 8.4*  MG  --   --   --  2.0 1.8 1.9 2.0  PHOS  --   --   --   --  2.2* 2.4* 2.9   < > = values in this interval not displayed.   Liver Function Tests: Recent Labs  Lab 06/25/19 2231 06/27/19 0656 06/28/19 0548 06/29/19 0551 06/30/19 0534  AST 24 24 19 15 16   ALT 24 32 28 27 27   ALKPHOS 56 53 55 67 64  BILITOT 0.8 0.8 0.6 0.7 0.4  PROT 7.3 6.0* 5.0* 6.4* 6.6  ALBUMIN 3.7 2.7* 2.3* 2.7* 2.7*   No results for input(s): LIPASE, AMYLASE in the last 168 hours. No results for input(s): AMMONIA in the last 168 hours. CBC: Recent Labs  Lab 06/25/19 2231 06/25/19 2231 06/26/19 0113 06/27/19 0656 06/28/19 0548 06/29/19 0551 06/30/19 0534  WBC 11.2*  --   --  25.2* 16.4* 12.2* 8.4  NEUTROABS 9.0*  --   --   --  14.2* 9.4* 4.9  HGB 11.7*   < > 11.9* 9.7* 9.2* 10.0* 10.1*  HCT 36.5   < > 35.0* 31.4* 28.7* 33.4* 32.3*  MCV 96.3  --   --  101.3* 97.3 101.2* 95.6  PLT 309  --   --  244 235 284 353   < > = values in this interval not displayed.   Cardiac Enzymes: No results for input(s): CKTOTAL, CKMB, CKMBINDEX, TROPONINI in the last 168 hours. BNP: Invalid input(s): POCBNP CBG: No results for input(s): GLUCAP in the last 168  hours. D-Dimer No results for input(s): DDIMER in the last 72 hours. Hgb A1c Recent Labs    06/29/19 0551  HGBA1C 5.4   Lipid Profile No results for input(s): CHOL, HDL, LDLCALC, TRIG, CHOLHDL, LDLDIRECT in the last 72 hours. Thyroid function studies No results for input(s): TSH, T4TOTAL, T3FREE, THYROIDAB in the last 72 hours.  Invalid input(s): FREET3 Anemia work up No results for input(s): VITAMINB12, FOLATE, FERRITIN, TIBC, IRON, RETICCTPCT in the last 72 hours. Urinalysis    Component Value Date/Time   COLORURINE YELLOW 06/26/2019 0055   APPEARANCEUR CLEAR 06/26/2019 0055   LABSPEC 1.009 06/26/2019 0055   PHURINE 6.0 06/26/2019 0055   GLUCOSEU NEGATIVE 06/26/2019 0055   HGBUR MODERATE (A) 06/26/2019 0055   BILIRUBINUR NEGATIVE 06/26/2019 0055   BILIRUBINUR negative 06/14/2019 1424   BILIRUBINUR N 04/12/2014 1437   KETONESUR NEGATIVE 06/26/2019 0055   PROTEINUR 30 (A) 06/26/2019 0055   UROBILINOGEN 0.2 06/14/2019 1424   UROBILINOGEN 0.2 09/12/2012 1714   NITRITE NEGATIVE 06/26/2019 0055   LEUKOCYTESUR NEGATIVE 06/26/2019 0055   Sepsis Labs Invalid input(s): PROCALCITONIN,  WBC,  LACTICIDVEN Microbiology Recent Results (from the past 240 hour(s))  SARS CORONAVIRUS 2 (TAT 6-24 HRS) Nasopharyngeal Nasopharyngeal Swab     Status: None   Collection Time: 06/23/19 10:08 AM   Specimen: Nasopharyngeal Swab  Result Value Ref Range Status   SARS Coronavirus 2 NEGATIVE NEGATIVE Final    Comment: (NOTE) SARS-CoV-2 target nucleic acids are NOT DETECTED. The SARS-CoV-2 RNA is generally  detectable in upper and lower respiratory specimens during the acute phase of infection. Negative results do not preclude SARS-CoV-2 infection, do not rule out co-infections with other pathogens, and should not be used as the sole basis for treatment or other patient management decisions. Negative results must be combined with clinical observations, patient history, and epidemiological  information. The expected result is Negative. Fact Sheet for Patients: HairSlick.no Fact Sheet for Healthcare Providers: quierodirigir.com This test is not yet approved or cleared by the [place] FDA and  has been authorized for detection and/or diagnosis of SARS-CoV-2 by FDA under an Emergency Use Authorization (EUA). This EUA will remain  in effect (meaning this test can be used) for the duration of the COVID-19 declaration under Section 56 4(b)(1) of the Act, 21 U.S.C. section 360bbb-3(b)(1), unless the authorization is terminated or revoked sooner. Performed at [facility], [address], [place], [place] [address]   Culture, blood (Routine x 2)     Status: None (Preliminary result)   Collection Time: 06/25/19 10:31 PM   Specimen: BLOOD  Result Value Ref Range Status   Specimen Description   Final    BLOOD RIGHT HAND Performed at [facility], [address] [place], [place] [address]    Special Requests   Final    BOTTLES DRAWN AEROBIC AND ANAEROBIC Blood Culture results may not be optimal due to an inadequate volume of blood received in culture bottles Performed at [facility], [address] [place], [place] [address]    Culture   Final    NO GROWTH 4 DAYS Performed at [facility], [address] [place], [place] [address]    Report Status PENDING  Incomplete  Culture, blood (Routine x 2)     Status: None (Preliminary result)   Collection Time: 06/25/19 10:36 PM   Specimen: BLOOD RIGHT FOREARM  Result Value Ref Range Status   Specimen Description   Final    BLOOD RIGHT FOREARM Performed at [facility], [address] [place], [place] [address]    Special Requests   Final    BOTTLES DRAWN AEROBIC AND ANAEROBIC Blood Culture adequate volume Performed at [facility], [address], [place], [place] [address]     Culture   Final    NO GROWTH 4 DAYS Performed at [facility], [address], [place], [place] [address]    Report Status PENDING  Incomplete  Urine culture     Status: Abnormal   Collection Time: 06/26/19 12:55 AM   Specimen: Urine, Random  Result Value Ref Range Status   Specimen Description   Final    URINE, RANDOM Performed at [facility], [address] [place], [place] [address]    Special Requests   Final    NONE Performed at [facility], [address], [place], [place] [address]    Culture (A)  Final    <10,000 COLONIES/mL INSIGNIFICANT GROWTH Performed at [facility], [address] [place], [place] [address]    Report Status 06/27/2019 FINAL  Final  Respiratory Panel by RT PCR (Flu A&B, Covid) - Nasopharyngeal Swab     Status: None   Collection Time: 06/26/19  2:58 AM   Specimen: Nasopharyngeal Swab  Result Value Ref Range Status   SARS Coronavirus 2 by RT PCR NEGATIVE NEGATIVE Final    Comment: (NOTE) SARS-CoV-2 target nucleic acids are NOT DETECTED. The  SARS-CoV-2 RNA is generally detectable in upper respiratoy specimens during the acute phase of infection. The lowest concentration of SARS-CoV-2 viral copies this assay can detect is 131 copies/mL. A negative result does not preclude SARS-Cov-2 infection and should not be used as the sole basis for treatment or other patient management decisions. A negative result may occur with  improper specimen collection/handling, submission of specimen other than nasopharyngeal swab, presence of viral mutation(s) within the areas targeted by this assay, and inadequate number of viral copies (<131 copies/mL). A negative result must be combined with clinical observations, patient history, and epidemiological information. The expected result is Negative. Fact Sheet for Patients:  https://www.moore.com/ Fact Sheet for Healthcare Providers:   https://www.young.biz/ This test is not yet ap proved or cleared by the [place] FDA and  has been authorized for detection and/or diagnosis of SARS-CoV-2 by FDA under an Emergency Use Authorization (EUA). This EUA will remain  in effect (meaning this test can be used) for the duration of the COVID-19 declaration under Section 564(b)(1) of the Act, 21 U.S.C. section 360bbb-3(b)(1), unless the authorization is terminated or revoked sooner.    Influenza A by PCR NEGATIVE NEGATIVE Final   Influenza B by PCR NEGATIVE NEGATIVE Final    Comment: (NOTE) The Xpert Xpress SARS-CoV-2/FLU/RSV assay is intended as an aid in  the diagnosis of influenza from Nasopharyngeal swab specimens and  should not be used as a sole basis for treatment. Nasal washings and  aspirates are unacceptable for Xpert Xpress SARS-CoV-2/FLU/RSV  testing. Fact Sheet for Patients: https://www.moore.com/ Fact Sheet for Healthcare Providers: https://www.young.biz/ This test is not yet approved or cleared by the [place] FDA and  has been authorized for detection and/or diagnosis of SARS-CoV-2 by  FDA under an Emergency Use Authorization (EUA). This EUA will remain  in effect (meaning this test can be used) for the duration of the  Covid-19 declaration under Section 564(b)(1) of the Act, 21  U.S.C. section 360bbb-3(b)(1), unless the authorization is  terminated or revoked. Performed at [facility], [address], [place], [place] [address]   CSF culture     Status: None   Collection Time: 06/27/19  3:00 PM   Specimen: PATH Cytology CSF; Cerebrospinal Fluid  Result Value Ref Range Status   Specimen Description   Final    CSF Performed at [facility], [address], [place], [place] [address]    Special Requests   Final    NONE Performed at [facility], [address] [place], [place]  [address]    Gram Stain   Final    WBC PRESENT, PREDOMINANTLY MONONUCLEAR NO ORGANISMS SEEN K.WILSON,RN [id number] @1719  BY V.WILKINS    Culture   Final    NO GROWTH Performed at [facility], [address] [place], [place] [address]    Report Status 06/30/2019 FINAL  Final  Culture, fungus without smear     Status: None (Preliminary result)   Collection Time: 06/27/19  3:00 PM   Specimen: PATH Cytology CSF; Cerebrospinal Fluid  Result Value Ref Range Status   Specimen Description   Final    CSF Performed at [facility], [address], [place], [place] [address]    Special Requests   Final    NONE Performed at [facility], [address] [place], [place] [address]    Culture   Final    NO GROWTH 3 DAYS Performed at [facility], [address] [place], [place] [address]    Report Status PENDING  Incomplete     Time coordinating discharge: 35 minutes  SIGNED:   Jacquelin Hawking, MD [facility] 07/01/2019, 8:57 AM

## 2019-07-01 NOTE — Discharge Instructions (Signed)
Megan Lara,  You were in the hospital because of an infection. You will be discharged with antibiotics. You also had a slow heart rate. I discussed this with the cardiologist who said you didn't need to follow up unless you become symptomatic.

## 2019-07-01 NOTE — Progress Notes (Signed)
Discharge instructions discussed with patient and family, verbalized agreement and understanding 

## 2019-07-02 LAB — HSV 1/2 AB IGG/IGM CSF
HSV 1/2 Ab Screen IgG, CSF: <0.34 IV (ref <=0.89)
HSV 1/2 Ab, IgM, CSF: 0.22 IV (ref <=0.89)

## 2019-07-05 ENCOUNTER — Other Ambulatory Visit: Payer: Self-pay

## 2019-07-05 ENCOUNTER — Ambulatory Visit: Payer: 59 | Admitting: Family Medicine

## 2019-07-05 ENCOUNTER — Encounter: Payer: Self-pay | Admitting: Family Medicine

## 2019-07-05 VITALS — BP 118/74 | HR 70 | Temp 97.1°F | Ht 67.0 in | Wt 150.0 lb

## 2019-07-05 DIAGNOSIS — R519 Headache, unspecified: Secondary | ICD-10-CM

## 2019-07-05 DIAGNOSIS — G43709 Chronic migraine without aura, not intractable, without status migrainosus: Secondary | ICD-10-CM | POA: Diagnosis not present

## 2019-07-05 DIAGNOSIS — G44209 Tension-type headache, unspecified, not intractable: Secondary | ICD-10-CM | POA: Diagnosis not present

## 2019-07-05 MED ORDER — RIZATRIPTAN BENZOATE 10 MG PO TBDP
10.0000 mg | ORAL_TABLET | ORAL | 11 refills | Status: DC | PRN
Start: 2019-07-05 — End: 2023-03-10

## 2019-07-05 MED ORDER — GABAPENTIN 100 MG PO CAPS: 100.0000 mg | ORAL_CAPSULE | Freq: Every day | ORAL | 3 refills | Status: DC

## 2019-07-05 NOTE — Patient Instructions (Signed)
We will continue gabapentin 100mg  at bedtime for migraine prevention. We will start rizatriptan as needed for abortive therapy. Please take 1 tablet at onset of headache. May take 1 additional tablet in 2 hours if needed. Do not take more than 2 tablets in 24 hours or more than 10 in a month.   Stay well hydrated. Finish antibiotics as directed. Follow up closely with PCP.   Follow up with me in 3-6 months   Migraine Headache A migraine headache is a very strong throbbing pain on one side or both sides of your head. This type of headache can also cause other symptoms. It can last from 4 hours to 3 days. Talk with your doctor about what things may bring on (trigger) this condition. What are the causes? The exact cause of this condition is not known. This condition may be triggered or caused by:  Drinking alcohol.  Smoking.  Taking medicines, such as: ? Medicine used to treat chest pain (nitroglycerin). ? Birth control pills. ? Estrogen. ? Some blood pressure medicines.  Eating or drinking certain products.  Doing physical activity. Other things that may trigger a migraine headache include:  Having a menstrual period.  Pregnancy.  Hunger.  Stress.  Not getting enough sleep or getting too much sleep.  Weather changes.  Tiredness (fatigue). What increases the risk?  Being 19-67 years old.  Being female.  Having a family history of migraine headaches.  Being Caucasian.  Having depression or anxiety.  Being very overweight. What are the signs or symptoms?  A throbbing pain. This pain may: ? Happen in any area of the head, such as on one side or both sides. ? Make it hard to do daily activities. ? Get worse with physical activity. ? Get worse around bright lights or loud noises.  Other symptoms may include: ? Feeling sick to your stomach (nauseous). ? Vomiting. ? Dizziness. ? Being sensitive to bright lights, loud noises, or smells.  Before you get a  migraine headache, you may get warning signs (an aura). An aura may include: ? Seeing flashing lights or having blind spots. ? Seeing bright spots, halos, or zigzag lines. ? Having tunnel vision or blurred vision. ? Having numbness or a tingling feeling. ? Having trouble talking. ? Having weak muscles.  Some people have symptoms after a migraine headache (postdromal phase), such as: ? Tiredness. ? Trouble thinking (concentrating). How is this treated?  Taking medicines that: ? Relieve pain. ? Relieve the feeling of being sick to your stomach. ? Prevent migraine headaches.  Treatment may also include: ? Having acupuncture. ? Avoiding foods that bring on migraine headaches. ? Learning ways to control your body functions (biofeedback). ? Therapy to help you know and deal with negative thoughts (cognitive behavioral therapy). Follow these instructions at home: Medicines  Take over-the-counter and prescription medicines only as told by your doctor.  Ask your doctor if the medicine prescribed to you: ? Requires you to avoid driving or using heavy machinery. ? Can cause trouble pooping (constipation). You may need to take these steps to prevent or treat trouble pooping:  Drink enough fluid to keep your pee (urine) pale yellow.  Take over-the-counter or prescription medicines.  Eat foods that are high in fiber. These include beans, whole grains, and fresh fruits and vegetables.  Limit foods that are high in fat and sugar. These include fried or sweet foods. Lifestyle  Do not drink alcohol.  Do not use any products that contain nicotine or  tobacco, such as cigarettes, e-cigarettes, and chewing tobacco. If you need help quitting, ask your doctor.  Get at least 8 hours of sleep every night.  Limit and deal with stress. General instructions      Keep a journal to find out what may bring on your migraine headaches. For example, write down: ? What you eat and drink. ? How  much sleep you get. ? Any change in what you eat or drink. ? Any change in your medicines.  If you have a migraine headache: ? Avoid things that make your symptoms worse, such as bright lights. ? It may help to lie down in a dark, quiet room. ? Do not drive or use heavy machinery. ? Ask your doctor what activities are safe for you.  Keep all follow-up visits as told by your doctor. This is important. Contact a doctor if:  You get a migraine headache that is different or worse than others you have had.  You have more than 15 headache days in one month. Get help right away if:  Your migraine headache gets very bad.  Your migraine headache lasts longer than 72 hours.  You have a fever.  You have a stiff neck.  You have trouble seeing.  Your muscles feel weak or like you cannot control them.  You start to lose your balance a lot.  You start to have trouble walking.  You pass out (faint).  You have a seizure. Summary  A migraine headache is a very strong throbbing pain on one side or both sides of your head. These headaches can also cause other symptoms.  This condition may be treated with medicines and changes to your lifestyle.  Keep a journal to find out what may bring on your migraine headaches.  Contact a doctor if you get a migraine headache that is different or worse than others you have had.  Contact your doctor if you have more than 15 headache days in a month. This information is not intended to replace advice given to you by your health care provider. Make sure you discuss any questions you have with your health care provider. Document Revised: 06/12/2018 Document Reviewed: 04/02/2018 Elsevier Patient Education  [place].

## 2019-07-05 NOTE — Progress Notes (Addendum)
PATIENT: Megan Lara DOB: 1980/10/12  REASON FOR VISIT: follow up HISTORY FROM: patient  Chief Complaint  Patient presents with  . Follow-up    headaches, rm 8, alone     HISTORY OF PRESENT ILLNESS: Today 07/05/19 Megan Lara is a 39 y.o. female here today for follow up for headaches. She was seen in 04/2019 and started on amitriptyline. She reported increased grogginess and was switched to gabapentin 100mg  at bedtime. She was hospitalized 4/24/4/29/2021 for urosepsis. At one point, she was treated for suspected meningitis due to severe headache with neck pain and stiffness. LP and MRI were unremarkable. She was treated for complicated UTI. She continues oral abx now. Headaches have improved. She has had a couple of headaches since discharge but feels they are less severe. She does have occasional light sensitivity and nausea with headache. No vision changes or aura symptoms.   HISTORY: (copied from Dr 07/03/2019 note on 04/06/2019)  Dear Dr. 06/04/2019,   I saw your patient, Megan Lara, upon your kind request in my neurologic clinic today for initial consultation of her headaches.  The patient is unaccompanied today.  As you know, Megan Lara is a 39 year old right-handed woman with an underlying medical history of thyroid disease, history of cellulitis, depression, asthma, anemia, and overweight state, who reports ongoing issues with headaches.  She feels that she has improved overall since her hospitalization recently last month but still has recurrent headaches which are primarily in the back of her head and radiate forward, she also has frontal headaches, they feel constant and at times throbbing.  She has some light sensitivity, some nausea at times, typically no vomiting.  She denies any prior history of migraines.  She has not been on anything for her headaches.  She has tried some Toradol and has had some leftover Compazine.  I reviewed your office  records. She was recently discharged from the hospital.  She presented to the ER on 03/23/2019 with Has, myalgias, high fever of up to 104, and was found to have elevated white cell count. She was admitted for sepsis work-up.  She had an abnormal CSF but no organism was found, no growth on CSF culture, no organism on Gram stain.  She had been started on IV antibiotics but these were discontinued.  I reviewed the hospital records.  She was discharged on 03/28/2019.  COVID-19 testing was negative on 03/23/2019.  She had a head CT without contrast on 03/23/2019 reviewed the results: IMPRESSION: No acute intracranial pathology. She had a CT angiogram head and neck with and without contrast on 03/23/2019 and I reviewed the results: IMPRESSION: Negative CTA head and neck. No significant stenosis or large vessel occlusion. Negative for aneurysm or dissection.  She denies any significant snoring, she denies waking up with a sense of gasping for air or witnessed apneas.  She does not have any recurrent nocturia.She tries to hydrate well with water.  She drinks alcohol occasionally in the form of wine.  She quit smoking 3 years ago.  She drinks caffeine in the form of coffee or soda, 1-2 servings per day on average.  She lives with her husband and 2 young children, ages 28 and 90 months old.  She is currently not breast-feeding.   REVIEW OF SYSTEMS: Out of a complete 14 system review of symptoms, the patient complains only of the following symptoms, headaches and all other reviewed systems are negative.  ALLERGIES: No Known Allergies  HOME MEDICATIONS: Outpatient Medications  Prior to Visit  Medication Sig Dispense Refill  . acetaminophen (TYLENOL) 500 MG tablet Take 1,000 mg by mouth every 6 (six) hours as needed for mild pain, moderate pain or headache.     . albuterol (PROVENTIL HFA;VENTOLIN HFA) 108 (90 BASE) MCG/ACT inhaler Inhale 2 puffs into the lungs every 6 (six) hours as needed for wheezing.    .  busPIRone (BUSPAR) 5 MG tablet Take 5 mg by mouth 2 (two) times daily.    . cephALEXin (KEFLEX) 500 MG capsule Take 1 capsule (500 mg total) by mouth every 6 (six) hours for 5 days. 20 capsule 0  . cetirizine (ZYRTEC) 10 MG tablet Take 10 mg by mouth at bedtime.     [name] Kitchen levothyroxine (SYNTHROID, LEVOTHROID) 75 MCG tablet Take 75 mcg by mouth every other day.     . ondansetron (ZOFRAN ODT) 8 MG disintegrating tablet Take 1 tablet (8 mg total) by mouth every 8 (eight) hours as needed for nausea or vomiting. 20 tablet 0  . sertraline (ZOLOFT) 100 MG tablet Take 100 mg by mouth at bedtime.    . gabapentin (NEURONTIN) 100 MG capsule Take 1 capsule (100 mg total) by mouth at bedtime as needed. (Patient taking differently: Take 100 mg by mouth at bedtime. ) 30 capsule 3  . tamsulosin (FLOMAX) 0.4 MG CAPS capsule Take 1 capsule (0.4 mg total) by mouth daily. Take until kidney stone passes. 30 capsule 0  . oxyCODONE-acetaminophen (PERCOCET/ROXICET) 5-325 MG tablet Take 2 tablets by mouth every 6 (six) hours as needed for severe pain. 16 tablet 0   No facility-administered medications prior to visit.    PAST MEDICAL HISTORY: Past Medical History:  Diagnosis Date  . Adjustment disorder with mixed anxiety and depressed mood 12/17/2016  . Anemia   . Anemia affecting pregnancy, antepartum 12/17/2016  . Asthma   . Chorioamnionitis, delivered, current hospitalization 12/17/2016  . Depression   . Hx of cellulitis of skin with lymphangitis    from last CS  . Hypothyroidism 12/17/2016  . Thyroid dysfunction in pregnancy in third trimester     PAST SURGICAL HISTORY: Past Surgical History:  Procedure Laterality Date  . CESAREAN SECTION N/A 12/16/2016   Procedure: CESAREAN SECTION;  Surgeon: Shea Evans, MD;  Location: [facility]IRTHING SU[facility];  Service: Obstetrics;  Laterality: N/A;  . CESAREAN SECTION WITH BILATERAL TUBAL LIGATION Bilateral 05/25/2018   Procedure: Repeat CESAREAN SECTION WITH BILATERAL TUBAL  LIGATION;  Surgeon: Shea Evans, MD;  Location: [facility] LD ORS;  Service: Obstetrics;  Laterality: Bilateral;  EDD: 05/22/18  . EXTRACORPOREAL SHOCK WAVE LITHOTRIPSY Right 06/24/2019   Procedure: EXTRACORPOREAL SHOCK WAVE LITHOTRIPSY (ESWL);  Surgeon: Ihor Gully, MD;  Location: [facility];  Service: Urology;  Laterality: Right;  75 MINS  . GANGLION CYST EXCISION Left 4/15   3 rd toe  . HERNIA REPAIR Right 1991  . IR ANGIOGRAM PELVIS SELECTIVE OR SUPRASELECTIVE  05/28/2018  . IR ANGIOGRAM SELECTIVE EACH ADDITIONAL VESSEL  05/28/2018  . IR ANGIOGRAM SELECTIVE EACH ADDITIONAL VESSEL  05/28/2018  . IR ANGIOGRAM SELECTIVE EACH ADDITIONAL VESSEL  05/28/2018  . IR EMBO ART  VEN HEMORR LYMPH EXTRAV  INC GUIDE ROADMAPPING  05/28/2018  . IR US GUIDE VASC ACCESS RIGHT  05/28/2018  . TEE WITHOUT CARDIOVERSION N/A 06/30/2019   Procedure: TRANSESOPHAGEAL ECHOCARDIOGRAM (TEE);  Surgeon: Chrystie Nose, MD;  Location: [facility] ENDOSCOPY;  Service: Cardiovascular;  Laterality: N/A;  . TONSILLECTOMY  1997    FAMILY HISTORY: Family History  Adopted:  Yes  Problem Relation Age of Onset  . Uterine cancer Maternal Grandmother     SOCIAL HISTORY: Social History   Socioeconomic History  . Marital status: Married    Spouse name: Not on file  . Number of children: 0  . Years of education: Not on file  . Highest education level: Not on file  Occupational History  . Not on file  Tobacco Use  . Smoking status: Former Smoker    Packs/day: 0.50    Years: 7.00    Pack years: 3.50    Types: Cigarettes    Quit date: 03/04/2013    Years since quitting: 6.3  . Smokeless tobacco: Never Used  . Tobacco comment: does some vape  Substance and Sexual Activity  . Alcohol use: Not Currently    Alcohol/week: 0.0 standard drinks    Comment: 9 a week  . Drug use: No  . Sexual activity: Not Currently    Partners: Male    Birth control/protection: None  Other Topics Concern  . Not on file  Social History  Narrative  . Not on file   Social Determinants of Health   Financial Resource Strain:   . Difficulty of Paying Living Expenses:   Food Insecurity:   . Worried About Charity fundraiser in the Last Year:   . Arboriculturist in the Last Year:   Transportation Needs:   . Film/video editor (Medical):   [name] Kitchen Lack of Transportation (Non-Medical):   Physical Activity:   . Days of Exercise per Week:   . Minutes of Exercise per Session:   Stress:   . Feeling of Stress :   Social Connections:   . Frequency of Communication with Friends and Family:   . Frequency of Social Gatherings with Friends and Family:   . Attends Religious Services:   . Active Member of Clubs or Organizations:   . Attends Archivist Meetings:   [name] Kitchen Marital Status:   Intimate Partner Violence:   . Fear of Current or Ex-Partner:   . Emotionally Abused:   [name] Kitchen Physically Abused:   . Sexually Abused:       PHYSICAL EXAM  Vitals:   07/05/19 1504  BP: 118/74  Pulse: 70  Temp: (!) 97.1 F (36.2 C)  Weight: 150 lb (68 kg)  Height: 5\' 7"  (1.702 m)   Body mass index is 23.49 kg/m.  Generalized: Well developed, in no acute distress  Cardiology: normal rate and rhythm, no murmur noted Respiratory: clear to auscultation bilaterally  Neurological examination  Mentation: Alert oriented to time, place, history taking. Follows all commands speech and language fluent Cranial nerve II-XII: Pupils were equal round reactive to light. Extraocular movements were full, visual field were full  Motor: The motor testing reveals 5 over 5 strength of all 4 extremities. Good symmetric motor tone is noted throughout.  Gait and station: Gait is normal.   DIAGNOSTIC DATA (LABS, IMAGING, TESTING) - I reviewed patient records, labs, notes, testing and imaging myself where available.  No flowsheet data found.   Lab Results  Component Value Date   WBC 8.4 06/30/2019   HGB 10.1 (L) 06/30/2019   HCT 32.3 (L) 06/30/2019    MCV 95.6 06/30/2019   PLT 353 06/30/2019      Component Value Date/Time   NA 140 06/30/2019 0534   K 3.5 06/30/2019 0534   CL 107 06/30/2019 0534   CO2 24 06/30/2019 0534   GLUCOSE 106 (H) 06/30/2019 0534  BUN 6 06/30/2019 0534   CREATININE 0.65 06/30/2019 0534   CALCIUM 8.4 (L) 06/30/2019 0534   PROT 6.6 06/30/2019 0534   ALBUMIN 2.7 (L) 06/30/2019 0534   AST 16 06/30/2019 0534   ALT 27 06/30/2019 0534   ALKPHOS 64 06/30/2019 0534   BILITOT 0.4 06/30/2019 0534   GFRNONAA >60 06/30/2019 0534   GFRAA >60 06/30/2019 0534   No results found for: CHOL, HDL, LDLCALC, LDLDIRECT, TRIG, CHOLHDL Lab Results  Component Value Date   HGBA1C 5.4 06/29/2019   Lab Results  Component Value Date   VITAMINB12 1,355 (H) 06/28/2019   Lab Results  Component Value Date   TSH 2.607 06/28/2019       ASSESSMENT AND PLAN 39 y.o. year old female  has a past medical history of Adjustment disorder with mixed anxiety and depressed mood (12/17/2016), Anemia, Anemia affecting pregnancy, antepartum (12/17/2016), Asthma, Chorioamnionitis, delivered, current hospitalization (12/17/2016), Depression, cellulitis of skin with lymphangitis, Hypothyroidism (12/17/2016), and Thyroid dysfunction in pregnancy in third trimester. here with     ICD-10-CM   1. Chronic migraine without aura without status migrainosus, not intractable  G43.709   2. Recurrent headache  R51.9   3. Tension headache  G44.209     Megan Lara is recovering well following hospitalization for urosepsis.  She does report that headaches have improved since being discharged 4 days ago.  She does have chronic headaches with some migrainous features.  We will continue gabapentin 100 mg daily at bedtime as this has been helpful.  Also start rizatriptan 10 mg as needed for abortive therapy.  She was advised on appropriate administration of this medication.  She will stay well-hydrated and focus on healthy lifestyle habits.  She will follow-up  closely with her primary care provider and notify them of any worsening urinary symptoms.  She will follow up with me in 3 to 6 months, sooner if needed.  She verbalizes understanding and agreement with this plan.   No orders of the defined types were placed in this encounter.    Meds ordered this encounter  Medications  . rizatriptan (MAXALT-MLT) 10 MG disintegrating tablet    Sig: Take 1 tablet (10 mg total) by mouth as needed for migraine. May repeat in 2 hours if needed    Dispense:  9 tablet    Refill:  11    Order Specific Question:   Supervising Provider    Answer:   Anson Fret [id number]  . gabapentin (NEURONTIN) 100 MG capsule    Sig: Take 1 capsule (100 mg total) by mouth at bedtime.    Dispense:  90 capsule    Refill:  3    Order Specific Question:   Supervising Provider    Answer:   Anson Fret [id number]      I spent 15 minutes with the patient. 50% of this time was spent counseling and educating patient on plan of care and medications.    Shawnie Dapper, FNP-C 07/05/2019, 4:27 PM [facility] [address] [place], [place] [address] [phone]   I reviewed the above note and documentation by the Nurse Practitioner and agree with the history, exam, assessment and plan as outlined above. I was available for consultation. Huston Foley, MD, PhD [facility])

## 2019-07-14 ENCOUNTER — Ambulatory Visit
Admission: EM | Admit: 2019-07-14 | Discharge: 2019-07-14 | Disposition: A | Discharge status: Home / Self Care | Payer: 59 | Attending: Emergency Medicine | Admitting: Emergency Medicine

## 2019-07-14 ENCOUNTER — Other Ambulatory Visit: Payer: Self-pay

## 2019-07-14 ENCOUNTER — Ambulatory Visit (INDEPENDENT_AMBULATORY_CARE_PROVIDER_SITE_OTHER)
Admission: RE | Admit: 2019-07-14 | Discharge: 2019-07-14 | Disposition: A | Discharge status: Home / Self Care | Payer: 59 | Source: Ambulatory Visit

## 2019-07-14 DIAGNOSIS — B349 Viral infection, unspecified: Secondary | ICD-10-CM

## 2019-07-14 DIAGNOSIS — Z20822 Contact with and (suspected) exposure to covid-19: Secondary | ICD-10-CM

## 2019-07-14 MED ORDER — ONDANSETRON HCL 4 MG PO TABS
4.0000 mg | ORAL_TABLET | Freq: Four times a day (QID) | ORAL | 0 refills | Status: DC | PRN
Start: 2019-07-14 — End: 2021-04-12

## 2019-07-14 NOTE — ED Triage Notes (Signed)
Sent for covid testing

## 2019-07-14 NOTE — ED Provider Notes (Signed)
Virtual Visit via Video Note:  Megan Lara  initiated request for Telemedicine visit with [facility] team. I connected with Megan Lara  on 07/14/2019 at 3:08 PM  for a synchronized telemedicine visit using a video enabled HIPPA compliant telemedicine application. I verified that I am speaking with Megan Lara  using two identifiers. Mickie Bail, NP  was physically located in a [facility] Urgent care site and Lareina Espino was located at a different location.   The limitations of evaluation and management by telemedicine as well as the availability of in-person appointments were discussed. Patient was informed that she  may incur a bill ( including co-pay) for this virtual visit encounter. Megan Lara  expressed understanding and gave verbal consent to proceed with virtual visit.     History of Present Illness:Megan Lara  is a 39 y.o. female presents for evaluation of diarrhea, nausea, abdominal cramps, headache, light-headedness, low grade fever, and chills x 1 day.  No emesis.  Tmax 99.9.  Her family members had diarrhea earlier this week.   She reports no COVID test in the past 2 weeks.  She was hospitalized on 06/25/2019 - 07/01/2019 for fever after having lithotripsy on 06/24/2019.     No Known Allergies   Past Medical History:  Diagnosis Date  . Adjustment disorder with mixed anxiety and depressed mood 12/17/2016  . Anemia   . Anemia affecting pregnancy, antepartum 12/17/2016  . Asthma   . Chorioamnionitis, delivered, current hospitalization 12/17/2016  . Depression   . Hx of cellulitis of skin with lymphangitis    from last CS  . Hypothyroidism 12/17/2016  . Thyroid dysfunction in pregnancy in third trimester      Social History   Tobacco Use  . Smoking status: Former Smoker    Packs/day: 0.50    Years: 7.00    Pack years: 3.50    Types: Cigarettes    Quit date: 03/04/2013    Years since  quitting: 6.3  . Smokeless tobacco: Never Used  . Tobacco comment: does some vape  Substance Use Topics  . Alcohol use: Not Currently    Alcohol/week: 0.0 standard drinks    Comment: 9 a week  . Drug use: No    ROS: as stated in HPI.  All other systems reviewed and negative.      Observations/Objective: Physical Exam  VITALS: Patient denies fever. GENERAL: Alert, appears well and in no acute distress. HEENT: Atraumatic. NECK: Normal movements of the head and neck. CARDIOPULMONARY: No increased WOB. Speaking in clear sentences. I:E ratio WNL.  MS: Moves all visible extremities without noticeable abnormality. PSYCH: Pleasant and cooperative, well-groomed. Speech normal rate and rhythm. Affect is appropriate. Insight and judgement are appropriate. Attention is focused, linear, and appropriate.  NEURO: CN grossly intact. Oriented as arrived to appointment on time with no prompting. Moves both UE equally.  SKIN: No obvious lesions, wounds, erythema, or cyanosis noted on face or hands.   Assessment and Plan:    ICD-10-CM   1. Viral illness  B34.9        Follow Up Instructions: Recommended that patient come to the urgent care to get a Covid test.  Instructed her to self quarantine until the test result is back.  Treating nausea with Zofran.  Instructed her to stay hydrated with clear liquids.  Discussed that she should go to the ED if she has severe worsening symptoms.  Patient agrees to plan of care.  I discussed the assessment and treatment plan with the patient. The patient was provided an opportunity to ask questions and all were answered. The patient agreed with the plan and demonstrated an understanding of the instructions.   The patient was advised to call back or seek an in-person evaluation if the symptoms worsen or if the condition fails to improve as anticipated.      Sharion Balloon, NP  07/14/2019 3:08 PM         Sharion Balloon, NP 07/14/19 [phone]

## 2019-07-14 NOTE — Discharge Instructions (Signed)
Come to the Urgent Care to get a COVID test.  You should self quarantine until the test result is back.    Take the antinausea medication as directed.    Keep yourself hydrated with clear liquids, such as water, Gatorade, Pedialyte, Sprite, or ginger ale.    Go to the emergency department if you have worsening symptoms.

## 2019-07-15 LAB — NOVEL CORONAVIRUS, NAA: SARS-CoV-2, NAA: NOT DETECTED

## 2019-07-15 LAB — SARS-COV-2, NAA 2 DAY TAT

## 2019-07-18 LAB — CULTURE, FUNGUS WITHOUT SMEAR

## 2019-09-11 ENCOUNTER — Telehealth: Payer: 59 | Admitting: Nurse Practitioner

## 2019-09-11 DIAGNOSIS — B9789 Other viral agents as the cause of diseases classified elsewhere: Secondary | ICD-10-CM

## 2019-09-11 DIAGNOSIS — J329 Chronic sinusitis, unspecified: Secondary | ICD-10-CM

## 2019-09-11 MED ORDER — FLUTICASONE PROPIONATE 50 MCG/ACT NA SUSP
2.0000 | Freq: Every day | NASAL | 6 refills | Status: DC
Start: 2019-09-11 — End: 2021-04-12

## 2019-09-11 NOTE — Progress Notes (Signed)

## 2020-01-10 ENCOUNTER — Encounter: Payer: Self-pay | Admitting: Family Medicine

## 2020-01-10 ENCOUNTER — Ambulatory Visit: Payer: 59 | Admitting: Family Medicine

## 2020-01-10 NOTE — Progress Notes (Deleted)
No chief complaint on file.    HISTORY OF PRESENT ILLNESS: Today 01/10/20  Megan Lara is a 39 y.o. female here today for follow up for headaches. She has continues gabapentin 100mg  at bedtime and rizatriptan as needed.    HISTORY (copied from my note on 07/05/2019)  Megan Lara is a 39 y.o. female here today for follow up for headaches. She was seen in 04/2019 and started on amitriptyline. She reported increased grogginess and was switched to gabapentin 100mg  at bedtime. She was hospitalized 4/24/4/29/2021 for urosepsis. At one point, she was treated for suspected meningitis due to severe headache with neck pain and stiffness. LP and MRI were unremarkable. She was treated for complicated UTI. She continues oral abx now. Headaches have improved. She has had a couple of headaches since discharge but feels they are less severe. She does have occasional light sensitivity and nausea with headache. No vision changes or aura symptoms.   HISTORY: (copied from Dr note on 04/06/2019)  Dear Dr. Teofilo Pod,   I saw your patient, Megan Lara, upon your kind request in my neurologic clinic today for initial consultation of her headaches. The patient is unaccompanied today. As you know, Megan Lara is a 39 year old right-handed woman with an underlying medical history of thyroid disease, history of cellulitis, depression, asthma, anemia, and overweight state, who reports ongoing issues with headaches. She feels that she has improved overall since her hospitalization recently last month but still has recurrent headaches which are primarily in the back of her head and radiate forward, she also has frontal headaches, they feel constant and at times throbbing. She has some light sensitivity, some nausea at times, typically no vomiting. She denies any prior history of migraines. She has not been on anything for her headaches. She has tried some Toradol and has had some  leftover Compazine. I reviewed your office records. She was recently discharged from the hospital. She presented to the ER on 03/23/2019 with Has, myalgias, high fever of up to 104, and was found to have elevated white cell count. She was admitted for sepsis work-up. She had an abnormal CSF but no organism was found, no growth on CSF culture, no organism on Gram stain. She had been started on IV antibiotics but these were discontinued. I reviewed the hospital records. She was discharged on 03/28/2019. COVID-19 testing was negative on 03/23/2019.  She had a head CT without contrast on 03/23/2019 reviewed the results: IMPRESSION: No acute intracranial pathology. She had a CT angiogram head and neck with and without contrast on 03/23/2019 and I reviewed the results: IMPRESSION: Negative CTA head and neck. No significant stenosis or large vessel occlusion. Negative for aneurysm or dissection.  She denies any significant snoring, she denies waking up with a sense of gasping for air or witnessed apneas. She does not have any recurrent nocturia.She tries to hydrate well with water. She drinks alcohol occasionally in the form of wine. She quit smoking 3 years ago. She drinks caffeine in the form of coffee or soda, 1-2 servings per day on average. She lives with her husband and 2 young children, ages 35 and 55 months old. She is currently not breast-feeding.    REVIEW OF SYSTEMS: Out of a complete 14 system review of symptoms, the patient complains only of the following symptoms, and all other reviewed systems are negative.   ALLERGIES: No Known Allergies   HOME MEDICATIONS: Outpatient Medications Prior to Visit  Medication Sig Dispense Refill  .  acetaminophen (TYLENOL) 500 MG tablet Take 1,000 mg by mouth every 6 (six) hours as needed for mild pain, moderate pain or headache.     . albuterol (PROVENTIL HFA;VENTOLIN HFA) 108 (90 BASE) MCG/ACT inhaler Inhale 2 puffs into the lungs every 6 (six)  hours as needed for wheezing.    . busPIRone (BUSPAR) 5 MG tablet Take 5 mg by mouth 2 (two) times daily.    . cetirizine (ZYRTEC) 10 MG tablet Take 10 mg by mouth at bedtime.     . fluticasone (FLONASE) 50 MCG/ACT nasal spray Place 2 sprays into both nostrils daily. 16 g 6  . gabapentin (NEURONTIN) 100 MG capsule Take 1 capsule (100 mg total) by mouth at bedtime. 90 capsule 3  . levothyroxine (SYNTHROID, LEVOTHROID) 75 MCG tablet Take 75 mcg by mouth every other day.     . ondansetron (ZOFRAN ODT) 8 MG disintegrating tablet Take 1 tablet (8 mg total) by mouth every 8 (eight) hours as needed for nausea or vomiting. 20 tablet 0  . ondansetron (ZOFRAN) 4 MG tablet Take 1 tablet (4 mg total) by mouth every 6 (six) hours as needed for nausea or vomiting. 12 tablet 0  . rizatriptan (MAXALT-MLT) 10 MG disintegrating tablet Take 1 tablet (10 mg total) by mouth as needed for migraine. May repeat in 2 hours if needed 9 tablet 11  . sertraline (ZOLOFT) 100 MG tablet Take 100 mg by mouth at bedtime.     No facility-administered medications prior to visit.     PAST MEDICAL HISTORY: Past Medical History:  Diagnosis Date  . Adjustment disorder with mixed anxiety and depressed mood 12/17/2016  . Anemia   . Anemia affecting pregnancy, antepartum 12/17/2016  . Asthma   . Chorioamnionitis, delivered, current hospitalization 12/17/2016  . Depression   . Hx of cellulitis of skin with lymphangitis    from last CS  . Hypothyroidism 12/17/2016  . Thyroid dysfunction in pregnancy in third trimester      PAST SURGICAL HISTORY: Past Surgical History:  Procedure Laterality Date  . CESAREAN SECTION N/A 12/16/2016   Procedure: CESAREAN SECTION;  Surgeon: Shea Evans, MD;  Location: [facility] BIRTHING SUITES;  Service: Obstetrics;  Laterality: N/A;  . CESAREAN SECTION WITH BILATERAL TUBAL LIGATION Bilateral 05/25/2018   Procedure: Repeat CESAREAN SECTION WITH BILATERAL TUBAL LIGATION;  Surgeon: Shea Evans, MD;   Location: [facility] LD ORS;  Service: Obstetrics;  Laterality: Bilateral;  EDD: 05/22/18  . EXTRACORPOREAL SHOCK WAVE LITHOTRIPSY Right 06/24/2019   Procedure: EXTRACORPOREAL SHOCK WAVE LITHOTRIPSY (ESWL);  Surgeon: Ihor Gully, MD;  Location: [facility];  Service: Urology;  Laterality: Right;  75 MINS  . GANGLION CYST EXCISION Left 4/15   3 rd toe  . HERNIA REPAIR Right 1991  . IR ANGIOGRAM PELVIS SELECTIVE OR SUPRASELECTIVE  05/28/2018  . IR ANGIOGRAM SELECTIVE EACH ADDITIONAL VESSEL  05/28/2018  . IR ANGIOGRAM SELECTIVE EACH ADDITIONAL VESSEL  05/28/2018  . IR ANGIOGRAM SELECTIVE EACH ADDITIONAL VESSEL  05/28/2018  . IR EMBO ART  VEN HEMORR LYMPH EXTRAV  INC GUIDE ROADMAPPING  05/28/2018  . IR US GUIDE VASC ACCESS RIGHT  05/28/2018  . TEE WITHOUT CARDIOVERSION N/A 06/30/2019   Procedure: TRANSESOPHAGEAL ECHOCARDIOGRAM (TEE);  Surgeon: Chrystie Nose, MD;  Location: [facility] ENDOSCOPY;  Service: Cardiovascular;  Laterality: N/A;  . TONSILLECTOMY  1997     FAMILY HISTORY: Family History  Adopted: Yes  Problem Relation Age of Onset  . Uterine cancer Maternal Grandmother  SOCIAL HISTORY: Social History   Socioeconomic History  . Marital status: Married    Spouse name: Not on file  . Number of children: 0  . Years of education: Not on file  . Highest education level: Not on file  Occupational History  . Not on file  Tobacco Use  . Smoking status: Former Smoker    Packs/day: 0.50    Years: 7.00    Pack years: 3.50    Types: Cigarettes    Quit date: 03/04/2013    Years since quitting: 6.8  . Smokeless tobacco: Never Used  . Tobacco comment: does some vape  Vaping Use  . Vaping Use: Never used  Substance and Sexual Activity  . Alcohol use: Not Currently    Alcohol/week: 0.0 standard drinks    Comment: 9 a week  . Drug use: No  . Sexual activity: Not Currently    Partners: Male    Birth control/protection: None  Other Topics Concern  . Not on file  Social History  Narrative  . Not on file   Social Determinants of Health   Financial Resource Strain:   . Difficulty of Paying Living Expenses: Not on file  Food Insecurity:   . Worried About Programme researcher, broadcasting/film/videounning Out of Food in the Last Year: Not on file  . Ran Out of Food in the Last Year: Not on file  Transportation Needs:   . Lack of Transportation (Medical): Not on file  . Lack of Transportation (Non-Medical): Not on file  Physical Activity:   . Days of Exercise per Week: Not on file  . Minutes of Exercise per Session: Not on file  Stress:   . Feeling of Stress : Not on file  Social Connections:   . Frequency of Communication with Friends and Family: Not on file  . Frequency of Social Gatherings with Friends and Family: Not on file  . Attends Religious Services: Not on file  . Active Member of Clubs or Organizations: Not on file  . Attends BankerClub or Organization Meetings: Not on file  . Marital Status: Not on file  Intimate Partner Violence:   . Fear of Current or Ex-Partner: Not on file  . Emotionally Abused: Not on file  . Physically Abused: Not on file  . Sexually Abused: Not on file      PHYSICAL EXAM  There were no vitals filed for this visit. There is no height or weight on file to calculate BMI.   Generalized: Well developed, in no acute distress   Neurological examination  Mentation: Alert oriented to time, place, history taking. Follows all commands speech and language fluent Cranial nerve II-XII: Pupils were equal round reactive to light. Extraocular movements were full, visual field were full on confrontational test. Facial sensation and strength were normal. Uvula tongue midline. Head turning and shoulder shrug  were normal and symmetric. Motor: The motor testing reveals 5 over 5 strength of all 4 extremities. Good symmetric motor tone is noted throughout.  Sensory: Sensory testing is intact to soft touch on all 4 extremities. No evidence of extinction is noted.  Coordination:  Cerebellar testing reveals good finger-nose-finger and heel-to-shin bilaterally.  Gait and station: Gait is normal. Tandem gait is normal. Romberg is negative. No drift is seen.  Reflexes: Deep tendon reflexes are symmetric and normal bilaterally.     DIAGNOSTIC DATA (LABS, IMAGING, TESTING) - I reviewed patient records, labs, notes, testing and imaging myself where available.  Lab Results  Component Value Date  WBC 8.4 06/30/2019   HGB 10.1 (L) 06/30/2019   HCT 32.3 (L) 06/30/2019   MCV 95.6 06/30/2019   PLT 353 06/30/2019      Component Value Date/Time   NA 140 06/30/2019 0534   K 3.5 06/30/2019 0534   CL 107 06/30/2019 0534   CO2 24 06/30/2019 0534   GLUCOSE 106 (H) 06/30/2019 0534   BUN 6 06/30/2019 0534   CREATININE 0.65 06/30/2019 0534   CALCIUM 8.4 (L) 06/30/2019 0534   PROT 6.6 06/30/2019 0534   ALBUMIN 2.7 (L) 06/30/2019 0534   AST 16 06/30/2019 0534   ALT 27 06/30/2019 0534   ALKPHOS 64 06/30/2019 0534   BILITOT 0.4 06/30/2019 0534   GFRNONAA >60 06/30/2019 0534   GFRAA >60 06/30/2019 0534   No results found for: CHOL, HDL, LDLCALC, LDLDIRECT, TRIG, CHOLHDL Lab Results  Component Value Date   HGBA1C 5.4 06/29/2019   Lab Results  Component Value Date   VITAMINB12 1,355 (H) 06/28/2019   Lab Results  Component Value Date   TSH 2.607 06/28/2019      ASSESSMENT AND PLAN  39 y.o. year old female  has a past medical history of Adjustment disorder with mixed anxiety and depressed mood (12/17/2016), Anemia, Anemia affecting pregnancy, antepartum (12/17/2016), Asthma, Chorioamnionitis, delivered, current hospitalization (12/17/2016), Depression, cellulitis of skin with lymphangitis, Hypothyroidism (12/17/2016), and Thyroid dysfunction in pregnancy in third trimester. here with ***  No diagnosis found.   I spent 20 minutes of face-to-face and non-face-to-face time with patient.  This included previsit chart review, lab review, study review, order entry,  electronic health record documentation, patient education.    Shawnie Dapper, MSN, FNP-C 01/10/2020, 12:43 PM  [facility] [address] [place], [place] [address] [phone]

## 2020-05-18 ENCOUNTER — Ambulatory Visit
Admission: EM | Admit: 2020-05-18 | Discharge: 2020-05-18 | Disposition: A | Discharge status: Home / Self Care | Payer: 59 | Attending: Family Medicine | Admitting: Family Medicine

## 2020-05-18 ENCOUNTER — Other Ambulatory Visit: Payer: Self-pay

## 2020-05-18 DIAGNOSIS — G5601 Carpal tunnel syndrome, right upper limb: Secondary | ICD-10-CM | POA: Diagnosis not present

## 2020-05-18 DIAGNOSIS — M25532 Pain in left wrist: Secondary | ICD-10-CM

## 2020-05-18 MED ORDER — PREDNISONE 10 MG (21) PO TBPK: ORAL_TABLET | Freq: Every day | ORAL | 0 refills | Status: AC

## 2020-05-18 MED ORDER — KETOROLAC TROMETHAMINE 30 MG/ML IJ SOLN
30.0000 mg | Freq: Once | INTRAMUSCULAR | Status: AC
  Administered 2020-05-18: 30 mg via INTRAMUSCULAR

## 2020-05-18 NOTE — Discharge Instructions (Addendum)
You have received an injection of toradol in the office for pain  I have sent in a prednisone taper for you to take for 6 days. 6 tablets on day one, 5 tablets on day two, 4 tablets on day three, 3 tablets on day four, 2 tablets on day five, and 1 tablet on day six.  Follow up with this office or with primary care if symptoms are persisting.  Follow up in the ER for high fever, trouble swallowing, trouble breathing, other concerning symptoms.

## 2020-05-18 NOTE — ED Triage Notes (Signed)
Pt presents with c/o right hand pain and numbness, has h/o carpel tunnel , pt trying to get in with ortho next week,

## 2020-05-18 NOTE — ED Provider Notes (Signed)
[facility]   [id number] 05/18/20 Arrival Time: 1452  VE:LFYBO PAIN  SUBJECTIVE: History from: patient. Megan Lara is a 40 y.o. female complains of right wrist pain for the last week. Reports history of carpal tunnel and that she has been using her hands more often than usual. Denies a precipitating event or specific injury. Localizes the pain to the R wrist. Reports numbness to the right fingertips.  Describes the pain as constant and achy in character, with intermittent sharp pain. Has tried OTC medications without relief. Symptoms are made worse with activity. Denies similar symptoms in the past. Denies fever, chills, erythema, ecchymosis, effusion, saddle paresthesias, loss of bowel or bladder function.      ROS: As per HPI.  All other pertinent ROS negative.     Past Medical History:  Diagnosis Date  . Adjustment disorder with mixed anxiety and depressed mood 12/17/2016  . Anemia   . Anemia affecting pregnancy, antepartum 12/17/2016  . Asthma   . Chorioamnionitis, delivered, current hospitalization 12/17/2016  . Depression   . Hx of cellulitis of skin with lymphangitis    from last CS  . Hypothyroidism 12/17/2016  . Thyroid dysfunction in pregnancy in third trimester    Past Surgical History:  Procedure Laterality Date  . CESAREAN SECTION N/A 12/16/2016   Procedure: CESAREAN SECTION;  Surgeon: Shea Evans, MD;  Location: [facility] BIRTHING SUITES;  Service: Obstetrics;  Laterality: N/A;  . CESAREAN SECTION WITH BILATERAL TUBAL LIGATION Bilateral 05/25/2018   Procedure: Repeat CESAREAN SECTION WITH BILATERAL TUBAL LIGATION;  Surgeon: Shea Evans, MD;  Location: [facility] LD ORS;  Service: Obstetrics;  Laterality: Bilateral;  EDD: 05/22/18  . EXTRACORPOREAL SHOCK WAVE LITHOTRIPSY Right 06/24/2019   Procedure: EXTRACORPOREAL SHOCK WAVE LITHOTRIPSY (ESWL);  Surgeon: Ihor Gully, MD;  Location: [facility];  Service: Urology;  Laterality: Right;  75 MINS   . GANGLION CYST EXCISION Left 4/15   3 rd toe  . HERNIA REPAIR Right 1991  . IR ANGIOGRAM PELVIS SELECTIVE OR SUPRASELECTIVE  05/28/2018  . IR ANGIOGRAM SELECTIVE EACH ADDITIONAL VESSEL  05/28/2018  . IR ANGIOGRAM SELECTIVE EACH ADDITIONAL VESSEL  05/28/2018  . IR ANGIOGRAM SELECTIVE EACH ADDITIONAL VESSEL  05/28/2018  . IR EMBO ART  VEN HEMORR LYMPH EXTRAV  INC GUIDE ROADMAPPING  05/28/2018  . IR US GUIDE VASC ACCESS RIGHT  05/28/2018  . TEE WITHOUT CARDIOVERSION N/A 06/30/2019   Procedure: TRANSESOPHAGEAL ECHOCARDIOGRAM (TEE);  Surgeon: Chrystie Nose, MD;  Location: [facility] ENDOSCOPY;  Service: Cardiovascular;  Laterality: N/A;  . TONSILLECTOMY  1997   No Known Allergies No current facility-administered medications on file prior to encounter.   Current Outpatient Medications on File Prior to Encounter  Medication Sig Dispense Refill  . acetaminophen (TYLENOL) 500 MG tablet Take 1,000 mg by mouth every 6 (six) hours as needed for mild pain, moderate pain or headache.     . albuterol (PROVENTIL HFA;VENTOLIN HFA) 108 (90 BASE) MCG/ACT inhaler Inhale 2 puffs into the lungs every 6 (six) hours as needed for wheezing.    . busPIRone (BUSPAR) 5 MG tablet Take 5 mg by mouth 2 (two) times daily.    . cetirizine (ZYRTEC) 10 MG tablet Take 10 mg by mouth at bedtime.     . fluticasone (FLONASE) 50 MCG/ACT nasal spray Place 2 sprays into both nostrils daily. 16 g 6  . gabapentin (NEURONTIN) 100 MG capsule Take 1 capsule (100 mg total) by mouth at bedtime. 90 capsule 3  . levothyroxine (SYNTHROID,  LEVOTHROID) 75 MCG tablet Take 75 mcg by mouth every other day.     . ondansetron (ZOFRAN ODT) 8 MG disintegrating tablet Take 1 tablet (8 mg total) by mouth every 8 (eight) hours as needed for nausea or vomiting. 20 tablet 0  . ondansetron (ZOFRAN) 4 MG tablet Take 1 tablet (4 mg total) by mouth every 6 (six) hours as needed for nausea or vomiting. 12 tablet 0  . rizatriptan (MAXALT-MLT) 10 MG disintegrating  tablet Take 1 tablet (10 mg total) by mouth as needed for migraine. May repeat in 2 hours if needed 9 tablet 11  . sertraline (ZOLOFT) 100 MG tablet Take 100 mg by mouth at bedtime.     Social History   Socioeconomic History  . Marital status: Married    Spouse name: Not on file  . Number of children: 0  . Years of education: Not on file  . Highest education level: Not on file  Occupational History  . Not on file  Tobacco Use  . Smoking status: Former Smoker    Packs/day: 0.50    Years: 7.00    Pack years: 3.50    Types: Cigarettes    Quit date: 03/04/2013    Years since quitting: 7.2  . Smokeless tobacco: Never Used  . Tobacco comment: does some vape  Vaping Use  . Vaping Use: Never used  Substance and Sexual Activity  . Alcohol use: Not Currently    Alcohol/week: 0.0 standard drinks    Comment: 9 a week  . Drug use: No  . Sexual activity: Not Currently    Partners: Male    Birth control/protection: None  Other Topics Concern  . Not on file  Social History Narrative  . Not on file   Social Determinants of Health   Financial Resource Strain: Not on file  Food Insecurity: Not on file  Transportation Needs: Not on file  Physical Activity: Not on file  Stress: Not on file  Social Connections: Not on file  Intimate Partner Violence: Not on file   Family History  Adopted: Yes  Problem Relation Age of Onset  . Uterine cancer Maternal Grandmother     OBJECTIVE:  Vitals:   05/18/20 1502  BP: 121/62  Pulse: 76  Resp: 16  Temp: 98.9 F (37.2 C)  TempSrc: Oral  SpO2: 99%    General appearance: ALERT; in no acute distress.  Head: NCAT Lungs: Normal respiratory effort CV: pulses 2+ bilaterally. Cap refill < 2 seconds Musculoskeletal:  Inspection: Skin warm, dry, clear and intact No erythema, effusion noted Palpation: Right wrist tender to palpation ROM: Limited ROM active and passive to R wrist Skin: warm and dry Neurologic: Ambulates without difficulty;  Sensation intact about the upper/ lower extremities Psychological: alert and cooperative; normal mood and affect  DIAGNOSTIC STUDIES:  No results found.   ASSESSMENT & PLAN:  1. Carpal tunnel syndrome of right wrist   2. Left wrist pain     Meds ordered this encounter  Medications  . ketorolac (TORADOL) 30 MG/ML injection 30 mg  . predniSONE (STERAPRED UNI-PAK 21 TAB) 10 MG (21) TBPK tablet    Sig: Take by mouth daily for 6 days. Take 6 tablets on day 1, 5 tablets on day 2, 4 tablets on day 3, 3 tablets on day 4, 2 tablets on day 5, 1 tablet on day 6    Dispense:  21 tablet    Refill:  0    Order Specific Question:  Supervising Provider    Answer:   Merrilee Jansky [[id number]]   Toradol 30mg  IM in office today Prednisone taper prescribed Continue sleeping in wrist splint May eventually need surgical repair with carpal tunnel release Continue conservative management of rest, ice, and gentle stretches Take ibuprofen as needed for pain relief (may cause abdominal discomfort, ulcers, and GI bleeds avoid taking with other NSAIDs)  Follow up with PCP if symptoms persist Return or go to the ER if you have any new or worsening symptoms (fever, chills, chest pain, abdominal pain, changes in bowel or bladder habits, pain radiating into lower legs)   Reviewed expectations re: course of current medical issues. Questions answered. Outlined signs and symptoms indicating need for more acute intervention. Patient verbalized understanding. After Visit Summary given.       , NP 05/18/20 1519

## 2020-05-24 ENCOUNTER — Encounter: Payer: Self-pay | Admitting: Physician Assistant

## 2020-05-24 ENCOUNTER — Ambulatory Visit (INDEPENDENT_AMBULATORY_CARE_PROVIDER_SITE_OTHER): Payer: 59 | Admitting: Physician Assistant

## 2020-05-24 DIAGNOSIS — G5601 Carpal tunnel syndrome, right upper limb: Secondary | ICD-10-CM | POA: Diagnosis not present

## 2020-05-24 DIAGNOSIS — G5602 Carpal tunnel syndrome, left upper limb: Secondary | ICD-10-CM | POA: Diagnosis not present

## 2020-05-24 MED ORDER — METHYLPREDNISOLONE ACETATE 40 MG/ML IJ SUSP
20.0000 mg | INTRAMUSCULAR | Status: AC | PRN
  Administered 2020-05-24: 20 mg

## 2020-05-24 MED ORDER — LIDOCAINE HCL 1 % IJ SOLN
0.5000 mL | INTRAMUSCULAR | Status: AC | PRN
  Administered 2020-05-24: .5 mL

## 2020-05-24 NOTE — Progress Notes (Signed)
Office Visit Note   Patient: Megan Lara           Date of Birth: Nov 24, 1980           MRN: [id number] Visit Date: 05/24/2020              Requested by: Mila Palmer, MD [address] [place],  [place] [address] PCP: Mila Palmer, MD   Assessment & Plan: Visit Diagnoses:  1. Carpal tunnel syndrome, right upper limb   2. 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  We will have her use Voltaren gel over the median nerve bilaterally 2 g up to 4 times daily as needed.  See her back in 4 months that time may consider right carpal tunnel release.  Questions were encouraged and answered.  Follow-Up Instructions: Return in about 4 months (around 09/23/2020).   Orders:  No orders of the defined types were placed in this encounter.  No orders of the defined types were placed in this encounter.     Procedures: Hand/UE Inj: R carpal tunnel for carpal tunnel syndrome on 05/24/2020 5:33 PM Details: 25 G needle, volar approach Medications: 0.5 mL lidocaine 1 %; 20 mg methylPREDNISolone acetate 40 MG/ML Consent was given by the patient. Immediately prior to procedure a time out was called to verify the correct patient, procedure, equipment, support staff and site/side marked as required. Patient was prepped and draped in the usual sterile fashion.       Clinical Data: No additional findings.   Subjective: Chief Complaint  Patient presents with  . Right Hand - Numbness, Pain  . Left Hand - Numbness, Pain    HPI Patient is a 40 year old female who is well-known to Dr. Magnus Ivan service is been sometime since we have seen her.  She comes in with bilateral hand pain numbness tingling.  Worse in the right hand.  Wakes her at night.  She did go to the urgent care due to the pain in her right hand and she was  given prednisone Dosepak and Toradol which helped some with the pain but she still having numbness in the right hand.  She states she can awaken to go to work in the morning with right hand numbness that lasted until about 9:00 in the morning.  She does a lot of repetitive motion at work she is right-hand dominant.  She has had no known injury.  She did undergo EMG nerve conduction studies back in 2018 which showed moderate carpal tunnel syndrome on the right and mild carpal tunnel syndrome on the left.  Review of Systems Negative for fevers chills shortness of breath chest pain.  Objective: Vital Signs: There were no vitals taken for this visit.  Physical Exam Constitutional:      Appearance: She is not ill-appearing or diaphoretic.  Cardiovascular:     Pulses: Normal pulses.  Neurological:     Mental Status: She is alert and oriented to person, place, and time.  Psychiatric:        Mood and Affect: Mood normal.     Ortho Exam Bilateral hands full sensation full motor.  Positive compression test over the median nerve bilaterally.  Positive Phalen's bilaterally.  Negative Tinel's over the median nerve bilaterally. Specialty Comments:  No specialty comments available.  Imaging: No results found.   PMFS History: Patient Active Problem List   Diagnosis Date Noted  . Complicated UTI (urinary tract infection) 07/01/2019  . Fever 06/26/2019  . Leukocytosis 06/26/2019  . Sepsis (HCC) 06/26/2019  . 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 Hematoma of rectus sheath 05/28/2018  . Status post repeat low transverse cesarean section and BTL 05/25/2018  . Postpartum care following cesarean delivery (3/23) 05/25/2018  . Carpal tunnel syndrome, left upper limb 01/13/2017  . Carpal tunnel syndrome, right upper limb 01/13/2017  . Hypothyroidism 12/17/2016  . Adjustment disorder with mixed anxiety and depressed mood 12/17/2016   Past Medical History:  Diagnosis Date  . Adjustment disorder with mixed  anxiety and depressed mood 12/17/2016  . Anemia   . Anemia affecting pregnancy, antepartum 12/17/2016  . Asthma   . Chorioamnionitis, delivered, current hospitalization 12/17/2016  . Depression   . Hx of cellulitis of skin with lymphangitis    from last CS  . Hypothyroidism 12/17/2016  . Thyroid dysfunction in pregnancy in third trimester     Family History  Adopted: Yes  Problem Relation Age of Onset  . Uterine cancer Maternal Grandmother     Past Surgical History:  Procedure Laterality Date  . CESAREAN SECTION N/A 12/16/2016   Procedure: CESAREAN SECTION;  Surgeon: Shea Evans, MD;  Location: [facility];  Service: Obstetrics;  Laterality: N/A;  . CESAREAN SECTION WITH BILATERAL TUBAL LIGATION Bilateral 05/25/2018   Procedure: Repeat CESAREAN SECTION WITH BILATERAL TUBAL LIGATION;  Surgeon: Shea Evans, MD;  Location: [facility];  Service: Obstetrics;  Laterality: Bilateral;  EDD: 05/22/18  . EXTRACORPOREAL SHOCK WAVE LITHOTRIPSY Right 06/24/2019   Procedure: EXTRACORPOREAL SHOCK WAVE LITHOTRIPSY (ESWL);  Surgeon: Ihor Gully, MD;  Location: [facility];  Service: Urology;  Laterality: Right;  75 MINS  . GANGLION CYST EXCISION Left 4/15   3 rd toe  . HERNIA REPAIR Right 1991  . IR ANGIOGRAM PELVIS SELECTIVE OR SUPRASELECTIVE  05/28/2018  . IR ANGIOGRAM SELECTIVE EACH ADDITIONAL VESSEL  05/28/2018  . IR ANGIOGRAM SELECTIVE EACH ADDITIONAL VESSEL  05/28/2018  . IR ANGIOGRAM SELECTIVE EACH ADDITIONAL VESSEL  05/28/2018  . IR EMBO ART  VEN HEMORR LYMPH EXTRAV  INC GUIDE ROADMAPPING  05/28/2018  . IR US GUIDE VASC ACCESS RIGHT  05/28/2018  . TEE WITHOUT CARDIOVERSION N/A 06/30/2019   Procedure: TRANSESOPHAGEAL ECHOCARDIOGRAM (TEE);  Surgeon: Chrystie Nose, MD;  Location: [facility];  Service: Cardiovascular;  Laterality: N/A;  . TONSILLECTOMY  1997   Social History   Occupational History  . Not on file  Tobacco Use  . Smoking status: Former Smoker     Packs/day: 0.50    Years: 7.00    Pack years: 3.50    Types: Cigarettes    Quit date: 03/04/2013    Years since quitting: 7.2  . Smokeless tobacco: Never Used  . Tobacco comment: does some vape  Vaping Use  . Vaping Use: Never used  Substance and Sexual Activity  . Alcohol use: Not Currently    Alcohol/week: 0.0 standard drinks    Comment: 9 a week  . Drug use: No  . Sexual activity: Not Currently    Partners: Male    Birth control/protection: None

## 2020-06-20 ENCOUNTER — Ambulatory Visit: Admit: 2020-06-20 | Disposition: A | Discharge status: Home / Self Care | Payer: 59

## 2020-06-20 ENCOUNTER — Ambulatory Visit
Admission: EM | Admit: 2020-06-20 | Discharge: 2020-06-20 | Disposition: A | Discharge status: Home / Self Care | Payer: 59 | Attending: Family Medicine | Admitting: Family Medicine

## 2020-06-20 DIAGNOSIS — G4486 Cervicogenic headache: Secondary | ICD-10-CM

## 2020-06-20 MED ORDER — TIZANIDINE HCL 4 MG PO TABS
4.0000 mg | ORAL_TABLET | Freq: Every day | ORAL | 0 refills | Status: DC
Start: 2020-06-20 — End: 2021-04-12

## 2020-06-20 MED ORDER — PREDNISONE 20 MG PO TABS
40.0000 mg | ORAL_TABLET | Freq: Every day | ORAL | 0 refills | Status: DC
Start: 2020-06-20 — End: 2021-04-12

## 2020-06-20 NOTE — ED Triage Notes (Addendum)
Pt presents with complaints of dizziness, neck pain that goes into her back, and swollen lymphnodes in her neck x 1 week. Reports she had a filling fall out months ago and is unsure if that has anything to do with it. Reports pain in that tooth x 2 weeks now. The neck pain is concerning to the patient due to history of meningitis and sepsis. Pt reports diarrhea and cold chills on Saturday.

## 2020-06-20 NOTE — ED Provider Notes (Signed)
[facility]    CSN: [id number] Arrival date & time: 06/20/20  1433      History   Chief Complaint Chief Complaint  Patient presents with  . Back Pain  . Lymphadenopathy    HPI Megan Lara is a 40 y.o. female.   HPI  Patient with a history of asthma, adjustment disorder and carpal tunnel and a history of sepsis along with meningitis presents today with neck pain ongoing for the last 2 days.  Neck pain is present at the base of the occipital region and radiating to occipita region of head.  Patient has not had any fever, nausea or vomiting.  She endorses some intermittent dizziness which precipitated current symptoms.  She also reports that she had some cervical adenopathy 2 weeks ago which she was not treated for and has subsequently resolved.  She denies any throat pain or any associated URI symptoms    Past Medical History:  Diagnosis Date  . Adjustment disorder with mixed anxiety and depressed mood 12/17/2016  . Anemia   . Anemia affecting pregnancy, antepartum 12/17/2016  . Asthma   . Chorioamnionitis, delivered, current hospitalization 12/17/2016  . Depression   . Hx of cellulitis of skin with lymphangitis    from last CS  . Hypothyroidism 12/17/2016  . Thyroid dysfunction in pregnancy in third trimester     Patient Active Problem List   Diagnosis Date Noted  . Complicated UTI (urinary tract infection) 07/01/2019  . Fever 06/26/2019  . Leukocytosis 06/26/2019  . Sepsis (HCC) 06/26/2019  . 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 Hematoma of rectus sheath 05/28/2018  . Status post repeat low transverse cesarean section and BTL 05/25/2018  . Postpartum care following cesarean delivery (3/23) 05/25/2018  . Carpal tunnel syndrome, left upper limb 01/13/2017  . Carpal tunnel syndrome, right upper limb 01/13/2017  . Hypothyroidism 12/17/2016  . Adjustment disorder with mixed anxiety and depressed mood 12/17/2016    Past Surgical History:  Procedure  Laterality Date  . CESAREAN SECTION N/A 12/16/2016   Procedure: CESAREAN SECTION;  Surgeon: Shea Evans, MD;  Location: [facility]IRTHING SU[facility];  Service: Obstetrics;  Laterality: N/A;  . CESAREAN SECTION WITH BILATERAL TUBAL LIGATION Bilateral 05/25/2018   Procedure: Repeat CESAREAN SECTION WITH BILATERAL TUBAL LIGATION;  Surgeon: Shea Evans, MD;  Location: [facility];  Service: Obstetrics;  Laterality: Bilateral;  EDD: 05/22/18  . EXTRACORPOREAL SHOCK WAVE LITHOTRIPSY Right 06/24/2019   Procedure: EXTRACORPOREAL SHOCK WAVE LITHOTRIPSY (ESWL);  Surgeon: Ihor Gully, MD;  Location: [facility];  Service: Urology;  Laterality: Right;  75 MINS  . GANGLION CYST EXCISION Left 4/15   3 rd toe  . HERNIA REPAIR Right 1991  . IR ANGIOGRAM PELVIS SELECTIVE OR SUPRASELECTIVE  05/28/2018  . IR ANGIOGRAM SELECTIVE EACH ADDITIONAL VESSEL  05/28/2018  . IR ANGIOGRAM SELECTIVE EACH ADDITIONAL VESSEL  05/28/2018  . IR ANGIOGRAM SELECTIVE EACH ADDITIONAL VESSEL  05/28/2018  . IR EMBO ART  VEN HEMORR LYMPH EXTRAV  INC GUIDE ROADMAPPING  05/28/2018  . IR US GUIDE VASC ACCESS RIGHT  05/28/2018  . TEE WITHOUT CARDIOVERSION N/A 06/30/2019   Procedure: TRANSESOPHAGEAL ECHOCARDIOGRAM (TEE);  Surgeon: Chrystie Nose, MD;  Location: [facility] ENDOSCOPY;  Service: Cardiovascular;  Laterality: N/A;  . TONSILLECTOMY  1997    OB History    Gravida  2   Para  2   Term  2   Preterm      AB      Living  1  SAB      IAB      Ectopic      Multiple  0   Live Births  1            Home Medications    Prior to Admission medications   Medication Sig Start Date End Date Taking? Authorizing Provider  predniSONE (DELTASONE) 20 MG tablet Take 2 tablets (40 mg total) by mouth daily with breakfast. 06/20/20  Yes Bing Neighbors, FNP  tiZANidine (ZANAFLEX) 4 MG tablet Take 1 tablet (4 mg total) by mouth at bedtime. 06/20/20  Yes Bing Neighbors, FNP  acetaminophen (TYLENOL) 500 MG tablet Take  1,000 mg by mouth every 6 (six) hours as needed for mild pain, moderate pain or headache.     [provider]  albuterol (PROVENTIL HFA;VENTOLIN HFA) 108 (90 BASE) MCG/ACT inhaler Inhale 2 puffs into the lungs every 6 (six) hours as needed for wheezing.    [provider]  busPIRone (BUSPAR) 5 MG tablet Take 5 mg by mouth 2 (two) times daily.    Emergency, Nurse, RN  cetirizine (ZYRTEC) 10 MG tablet Take 10 mg by mouth at bedtime.     [provider]  fluticasone (FLONASE) 50 MCG/ACT nasal spray Place 2 sprays into both nostrils daily. 09/11/19   Daphine Deutscher, Mary-Margaret, FNP  gabapentin (NEURONTIN) 100 MG capsule Take 1 capsule (100 mg total) by mouth at bedtime. 07/05/19   Lomax, Amy, NP  levothyroxine (SYNTHROID, LEVOTHROID) 75 MCG tablet Take 75 mcg by mouth every other day.     [provider]  ondansetron (ZOFRAN ODT) 8 MG disintegrating tablet Take 1 tablet (8 mg total) by mouth every 8 (eight) hours as needed for nausea or vomiting. 06/21/19   Wallis Bamberg, PA-C  ondansetron (ZOFRAN) 4 MG tablet Take 1 tablet (4 mg total) by mouth every 6 (six) hours as needed for nausea or vomiting. 07/14/19   Mickie Bail, NP  rizatriptan (MAXALT-MLT) 10 MG disintegrating tablet Take 1 tablet (10 mg total) by mouth as needed for migraine. May repeat in 2 hours if needed 07/05/19   Lomax, Amy, NP  sertraline (ZOLOFT) 100 MG tablet Take 100 mg by mouth at bedtime.    [provider]    Family History Family History  Adopted: Yes  Problem Relation Age of Onset  . Uterine cancer Maternal Grandmother     Social History Social History   Tobacco Use  . Smoking status: Former Smoker    Packs/day: 0.50    Years: 7.00    Pack years: 3.50    Types: Cigarettes    Quit date: 03/04/2013    Years since quitting: 7.3  . Smokeless tobacco: Never Used  . Tobacco comment: does some vape  Vaping Use  . Vaping Use: Never used  Substance Use Topics  . Alcohol use: Not  Currently    Alcohol/week: 0.0 standard drinks    Comment: 9 a week  . Drug use: No     Allergies   Patient has no known allergies.   Review of Systems Review of Systems Pertinent negatives listed in HPI   Physical Exam Triage Vital Signs ED Triage Vitals [06/20/20 1626]  Enc Vitals Group     BP 121/80     Pulse Rate 80     Resp 20     Temp 98.9 F (37.2 C)     Temp Source Oral     SpO2 99 %     Weight  Height      Head Circumference      Peak Flow      Pain Score      Pain Loc      Pain Edu?      Excl. in GC?    No data found.  Updated Vital Signs BP 121/80 (BP Location: Left Arm)   Pulse 80   Temp 98.9 F (37.2 C) (Oral)   Resp 20   LMP 06/20/2020   SpO2 99%   Visual Acuity Right Eye Distance:   Left Eye Distance:   Bilateral Distance:    Right Eye Near:   Left Eye Near:    Bilateral Near:     Physical Exam HENT:     Head: Normocephalic and atraumatic.     Nose: Nose normal. No congestion.     Mouth/Throat:     Mouth: Mucous membranes are moist.  Eyes:     Extraocular Movements: Extraocular movements intact.     Pupils: Pupils are equal, round, and reactive to light.  Cardiovascular:     Rate and Rhythm: Normal rate and regular rhythm.  Pulmonary:     Effort: Pulmonary effort is normal.     Breath sounds: Normal breath sounds.  Musculoskeletal:     Cervical back: Normal range of motion. No rigidity.  Lymphadenopathy:     Cervical: No cervical adenopathy.  Skin:    General: Skin is warm.     Capillary Refill: Capillary refill takes less than 2 seconds.  Neurological:     General: No focal deficit present.     Mental Status: She is alert and oriented to person, place, and time.  Psychiatric:        Mood and Affect: Mood normal.        Behavior: Behavior normal.        Thought Content: Thought content normal.        Judgment: Judgment normal.    .  UC Treatments / Results  Labs (all labs ordered are listed, but only  abnormal results are displayed) Labs Reviewed - No data to display  EKG   Radiology No results found.  Procedures Procedures (including critical care time)  Medications Ordered in UC Medications - No data to display  Initial Impression / Assessment and Plan / UC Course  I have reviewed the triage vital signs and the nursing notes.  Pertinent labs & imaging results that were available during my care of the patient were reviewed by me and considered in my medical decision making (see chart for details).     Cervicogenic headache treating with prednisone 40 mg once daily for total 5 days.  For acute pain tizanidine 4 mg at bedtime as needed.  Follow-up with PCP as needed. Final Clinical Impressions(s) / UC Diagnoses   Final diagnoses:  Cervicogenic headache   Discharge Instructions   None    ED Prescriptions    Medication Sig Dispense Auth. Provider   predniSONE (DELTASONE) 20 MG tablet Take 2 tablets (40 mg total) by mouth daily with breakfast. 10 tablet Bing Neighbors, FNP   tiZANidine (ZANAFLEX) 4 MG tablet Take 1 tablet (4 mg total) by mouth at bedtime. 20 tablet Bing Neighbors, FNP     PDMP not reviewed this encounter.   Bing Neighbors, FNP 06/20/20 [phone]

## 2020-09-20 ENCOUNTER — Ambulatory Visit: Payer: 59 | Admitting: Orthopaedic Surgery

## 2020-09-20 ENCOUNTER — Other Ambulatory Visit: Payer: Self-pay

## 2020-09-20 ENCOUNTER — Encounter: Payer: Self-pay | Admitting: Orthopaedic Surgery

## 2020-09-20 DIAGNOSIS — G5601 Carpal tunnel syndrome, right upper limb: Secondary | ICD-10-CM

## 2020-09-20 NOTE — Progress Notes (Signed)
The patient is well-known to [place].  She has known significant carpal tunnel syndrome involving the right upper extremity.  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  She says now her right hand is staying numb.  She has had a steroid injection in the transverse carpal ligament area that helped for a while.  The numbness and tingling that wakes her up at night and it affects her daily as well.  She is experiencing weakness with pinch and grip activities on her right dominant side.  She is not a smoker not a diabetic and only 40 years old.  At this point she is interested in carpal tunnel surgery.  On the right side she does have weak pinch and grip strength.  There is no muscle atrophy.  She has a positive Phalen's and Tinel's exam as well.  Her hand is well-perfused.  We talked about the anatomy of the transverse carpal ligament.  We described the risks and benefits of release of the transverse carpal ligament as well as what to expect in the postoperative course with the recovery.  All questions and concerns were answered and addressed.  We will work on getting this scheduled at this standpoint, I feel this is medically necessary.

## 2020-09-30 ENCOUNTER — Telehealth: Payer: 59 | Admitting: Nurse Practitioner

## 2020-09-30 DIAGNOSIS — N3000 Acute cystitis without hematuria: Secondary | ICD-10-CM

## 2020-09-30 MED ORDER — CEPHALEXIN 500 MG PO CAPS
500.0000 mg | ORAL_CAPSULE | Freq: Two times a day (BID) | ORAL | 0 refills | Status: AC
Start: 2020-09-30 — End: 2020-10-07

## 2020-09-30 NOTE — Progress Notes (Signed)
E-Visit for Urinary Problems  We are sorry that you are not feeling well.  Here is how we plan to help!  Based on what you shared with me it looks like you most likely have a simple urinary tract infection.  A UTI (Urinary Tract Infection) is a bacterial infection of the bladder.  Most cases of urinary tract infections are simple to treat but a key part of your care is to encourage you to drink plenty of fluids and watch your symptoms carefully.  I have prescribed Keflex 500 mg twice a day for 7 days.  Your symptoms should gradually improve. Call us if the burning in your urine worsens, you develop worsening fever, back pain or pelvic pain or if your symptoms do not resolve after completing the antibiotic.  Urinary tract infections can be prevented by drinking plenty of water to keep your body hydrated.  Also be sure when you wipe, wipe from front to back and don't hold it in!  If possible, empty your bladder every 4 hours.  HOME CARE Drink plenty of fluids Compete the full course of the antibiotics even if the symptoms resolve Remember, when you need to go.go. Holding in your urine can increase the likelihood of getting a UTI! GET HELP RIGHT AWAY IF: You cannot urinate You get a high fever Worsening back pain occurs You see blood in your urine You feel sick to your stomach or throw up You feel like you are going to pass out  MAKE SURE YOU  Understand these instructions. Will watch your condition. Will get help right away if you are not doing well or get worse.   Thank you for choosing an e-visit.  Your e-visit answers were reviewed by a board certified advanced clinical practitioner to complete your personal care plan. Depending upon the condition, your plan could have included both over the counter or prescription medications.  Please review your pharmacy choice. Make sure the pharmacy is open so you can pick up prescription now. If there is a problem, you may contact your  provider through [employer] and have the prescription routed to another pharmacy.  Your safety is important to [place]. If you have drug allergies check your prescription carefully.   For the next 24 hours you can use MyChart to ask questions about today's visit, request a non-urgent call back, or ask for a work or school excuse. You will get an email in the next two days asking about your experience. I hope that your e-visit has been valuable and will speed your recovery.   I spent approximately 10 minutes reviewing the patient's history, current symptoms and coordinating their care today.    Meds ordered this encounter  Medications   cephALEXin (KEFLEX) 500 MG capsule    Sig: Take 1 capsule (500 mg total) by mouth 2 (two) times daily for 7 days.    Dispense:  14 capsule    Refill:  0

## 2020-10-12 ENCOUNTER — Other Ambulatory Visit: Payer: Self-pay | Admitting: Orthopaedic Surgery

## 2020-10-12 DIAGNOSIS — G5601 Carpal tunnel syndrome, right upper limb: Secondary | ICD-10-CM

## 2020-10-12 MED ORDER — HYDROCODONE-ACETAMINOPHEN 5-325 MG PO TABS: 1.0000 | ORAL_TABLET | Freq: Four times a day (QID) | ORAL | 0 refills | Status: DC | PRN

## 2020-10-26 ENCOUNTER — Ambulatory Visit (INDEPENDENT_AMBULATORY_CARE_PROVIDER_SITE_OTHER): Payer: 59 | Admitting: Orthopaedic Surgery

## 2020-10-26 ENCOUNTER — Encounter: Payer: Self-pay | Admitting: Orthopaedic Surgery

## 2020-10-26 DIAGNOSIS — Z9889 Other specified postprocedural states: Secondary | ICD-10-CM

## 2020-10-26 NOTE — Progress Notes (Signed)
The patient is 2 weeks status post a right open carpal tunnel release.  She is only 40 years old and has quite significant carpal tunnel syndrome.  This is her dominant hand as well.  She does work in histology.  She reports weak pinch and grip strength but does feel better since having had the surgery.  She still has some pain at night but decreased numbness and tingling.  Her suture line looks good on her right palm.  Remove the sutures in place Steri-Strips.  We talked in length in detail about the things that she should do well using her hand.  I also gave her a work note to return to work starting September 12.  We need to have her out further since she does perform meticulous work with her hands and this is her dominant side.  All questions and concerns were answered and addressed.  I would like to see her back for regular postoperative visit in 4 weeks.

## 2020-11-23 ENCOUNTER — Encounter: Payer: Self-pay | Admitting: Orthopaedic Surgery

## 2020-11-23 ENCOUNTER — Ambulatory Visit: Payer: 59 | Admitting: Orthopaedic Surgery

## 2020-11-23 DIAGNOSIS — Z9889 Other specified postprocedural states: Secondary | ICD-10-CM

## 2020-11-23 MED ORDER — GABAPENTIN 300 MG PO CAPS
300.0000 mg | ORAL_CAPSULE | Freq: Every day | ORAL | 1 refills | Status: DC
Start: 2020-11-23 — End: 2021-04-12

## 2020-11-23 NOTE — Progress Notes (Signed)
Patient is now 6 weeks status post a right open carpal tunnel release.  She is right-hand dominant.  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  She is experience more pain and numbness and tingling in that right hand at night.  She also has pain at the incision site.  On examination of her palm the incision itself looks good and there is no evidence of infection.  She does have improved grip and pinch strength on my exam and no muscle atrophy.  She did let me know she is having to use ice and a wrist splint at night after work.  I will start her on 300 mg of Neurontin at bedtime and I think this will help her.  I also recommended Voltaren gel.  I asked her about modifying her work or keep her out of work but she is not able to do that.  All questions and concerns were answered and addressed.  I will see her back in 4 weeks to see how she is doing overall.

## 2020-12-19 ENCOUNTER — Telehealth: Payer: 59 | Admitting: Nurse Practitioner

## 2020-12-19 DIAGNOSIS — J0100 Acute maxillary sinusitis, unspecified: Secondary | ICD-10-CM

## 2020-12-19 DIAGNOSIS — R051 Acute cough: Secondary | ICD-10-CM

## 2020-12-20 MED ORDER — BENZONATATE 100 MG PO CAPS
100.0000 mg | ORAL_CAPSULE | Freq: Three times a day (TID) | ORAL | 0 refills | Status: DC | PRN
Start: 2020-12-20 — End: 2021-04-12

## 2020-12-20 MED ORDER — AMOXICILLIN-POT CLAVULANATE 875-125 MG PO TABS
1.0000 | ORAL_TABLET | Freq: Two times a day (BID) | ORAL | 0 refills | Status: DC
Start: 2020-12-20 — End: 2021-04-12

## 2020-12-20 NOTE — Progress Notes (Signed)
E-Visit for Sinus Problems  We are sorry that you are not feeling well.  Here is how we plan to help!  Based on what you have shared with me it looks like you have sinusitis.  Sinusitis is inflammation and infection in the sinus cavities of the head.  Based on your presentation I believe you most likely have Acute Bacterial Sinusitis.  This is an infection caused by bacteria and is treated with antibiotics. I have prescribed Augmentin 875mg /125mg  one tablet twice daily with food, for 7 days. You may use an oral decongestant such as Mucinex D or if you have glaucoma or high blood pressure use plain Mucinex. Saline nasal spray help and can safely be used as often as needed for congestion.  If you develop worsening sinus pain, fever or notice severe headache and vision changes, or if symptoms are not better after completion of antibiotic, please schedule an appointment with a health care provider.   I have also called in tessalon perles for your cough.   Sinus infections are not as easily transmitted as other respiratory infection, however we still recommend that you avoid close contact with loved ones, especially the very young and elderly.  Remember to wash your hands thoroughly throughout the day as this is the number one way to prevent the spread of infection!  Home Care: Only take medications as instructed by your medical team. Complete the entire course of an antibiotic. Do not take these medications with alcohol. A steam or ultrasonic humidifier can help congestion.  You can place a towel over your head and breathe in the steam from hot water coming from a faucet. Avoid close contacts especially the very young and the elderly. Cover your mouth when you cough or sneeze. Always remember to wash your hands.  Get Help Right Away If: You develop worsening fever or sinus pain. You develop a severe head ache or visual changes. Your symptoms persist after you have completed your treatment  plan.  Make sure you Understand these instructions. Will watch your condition. Will get help right away if you are not doing well or get worse.  Thank you for choosing an e-visit.  Your e-visit answers were reviewed by a board certified advanced clinical practitioner to complete your personal care plan. Depending upon the condition, your plan could have included both over the counter or prescription medications.  Please review your pharmacy choice. Make sure the pharmacy is open so you can pick up prescription now. If there is a problem, you may contact your provider through and have the prescription routed to another pharmacy.  Your safety is important to [employer]. If you have drug allergies check your prescription carefully.   For the next 24 hours you can use MyChart to ask questions about today's visit, request a non-urgent call back, or ask for a work or school excuse. You will get an email in the next two days asking about your experience. I hope that your e-visit has been valuable and will speed your recovery.  5-10 minutes spent reviewing and documenting in chart.

## 2020-12-25 ENCOUNTER — Encounter: Payer: Self-pay | Admitting: Orthopaedic Surgery

## 2020-12-25 ENCOUNTER — Ambulatory Visit (INDEPENDENT_AMBULATORY_CARE_PROVIDER_SITE_OTHER): Payer: 59 | Admitting: Orthopaedic Surgery

## 2020-12-25 DIAGNOSIS — Z9889 Other specified postprocedural states: Secondary | ICD-10-CM

## 2020-12-25 NOTE — Progress Notes (Signed)
The patient is now 10 weeks status post a right open carpal tunnel release.  She is doing much better overall.  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  She denies any numbness in her hand.  We had had her on Neurontin at bedtime.  She does have mild carpal tunnel syndrome on the left side.  She has equal and strong grip strength bilaterally and equal pinch strength bilaterally.  There is no numbness in the right side on the median nerve distribution.  There is no evidence of muscle atrophy in her hand and her incision looks good.  At this point she will decrease her Neurontin to every other night for a week and then can stop the Neurontin.  Obviously if her symptoms recur she will let us know.  At some point she may wish to proceed with a left open carpal tunnel release.  She will let us know.  All questions and concerns were answered and addressed.  Follow-up is as needed.

## 2021-04-12 ENCOUNTER — Telehealth: Payer: 59 | Admitting: Physician Assistant

## 2021-04-12 DIAGNOSIS — J4541 Moderate persistent asthma with (acute) exacerbation: Secondary | ICD-10-CM | POA: Diagnosis not present

## 2021-04-12 DIAGNOSIS — J208 Acute bronchitis due to other specified organisms: Secondary | ICD-10-CM | POA: Diagnosis not present

## 2021-04-12 DIAGNOSIS — B9689 Other specified bacterial agents as the cause of diseases classified elsewhere: Secondary | ICD-10-CM

## 2021-04-12 MED ORDER — PROMETHAZINE-DM 6.25-15 MG/5ML PO SYRP: 5.0000 mL | ORAL_SOLUTION | Freq: Four times a day (QID) | ORAL | 0 refills | Status: DC | PRN

## 2021-04-12 MED ORDER — PREDNISONE 20 MG PO TABS: 40.0000 mg | ORAL_TABLET | Freq: Every day | ORAL | 0 refills | Status: DC

## 2021-04-12 MED ORDER — AZITHROMYCIN 250 MG PO TABS: ORAL_TABLET | ORAL | 0 refills | Status: AC

## 2021-04-12 NOTE — Progress Notes (Signed)
Virtual Visit Consent   Megan Lara, you are scheduled for a virtual visit with a [facility] provider today.     Just as with appointments in the office, your consent must be obtained to participate.  Your consent will be active for this visit and any virtual visit you may have with one of our providers in the next 365 days.     If you have a MyChart account, a copy of this consent can be sent to you electronically.  All virtual visits are billed to your insurance company just like a traditional visit in the office.    As this is a virtual visit, video technology does not allow for your provider to perform a traditional examination.  This may limit your provider's ability to fully assess your condition.  If your provider identifies any concerns that need to be evaluated in person or the need to arrange testing (such as labs, EKG, etc.), we will make arrangements to do so.     Although advances in technology are sophisticated, we cannot ensure that it will always work on either your end or our end.  If the connection with a video visit is poor, the visit may have to be switched to a telephone visit.  With either a video or telephone visit, we are not always able to ensure that we have a secure connection.     I need to obtain your verbal consent now.   Are you willing to proceed with your visit today?    Megan Lara has provided verbal consent on 04/12/2021 for a virtual visit (video or telephone).   Leeanne Rio, [place]   Date: 04/12/2021 4:35 PM   Virtual Visit via Video Note   I, Leeanne Rio, connected with  Megan Lara  ([id number], 12/16/1980) on 04/12/21 at  4:30 PM EST by a video-enabled telemedicine application and verified that I am speaking with the correct person using two identifiers.  Location: Patient: Virtual Visit Location Patient: Home Provider: Virtual Visit Location Provider: Home Office   I discussed the limitations of  evaluation and management by telemedicine and the availability of in person appointments. The patient expressed understanding and agreed to proceed.    History of Present Illness: Megan Lara is a 41 y.o. who identifies as a female who was assigned female at birth with a history of asthma, is being seen today for head and nasal congestion with progressively worsening chest congestion and cough. Now with some chest tightness and wheezing. Yesterday had to take a breathing treatment for this. Started noting a fever last night into today with Tmax (101). Notes similar symptoms last week that had gotten better before this hitting her hard again 2 days ago. Denies recent travel or sick contact. Has had a COVID PCR test which was negative. Has taken Mucinex and her regular inhalers/nebulizer solution.   HPI: HPI  Problems:  Patient Active Problem List   Diagnosis Date Noted   Complicated UTI (urinary tract infection) 07/01/2019   Fever 06/26/2019   Leukocytosis 06/26/2019   Sepsis ([place]) 06/26/2019   Meningitis 03/23/2019   Hematoma of rectus sheath 05/28/2018   Status post repeat low transverse cesarean section and BTL 05/25/2018   Postpartum care following cesarean delivery (3/23) 05/25/2018   Carpal tunnel syndrome, left upper limb 01/13/2017   Carpal tunnel syndrome, right upper limb 01/13/2017   Hypothyroidism 12/17/2016   Adjustment disorder with mixed anxiety and depressed mood 12/17/2016  Allergies: No Known Allergies Medications:  Current Outpatient Medications:    albuterol (PROVENTIL HFA;VENTOLIN HFA) 108 (90 BASE) MCG/ACT inhaler, Inhale 2 puffs into the lungs every 6 (six) hours as needed for wheezing., Disp: , Rfl:    azithromycin (ZITHROMAX) 250 MG tablet, Take 2 tablets on day 1, then 1 tablet daily on days 2 through 5, Disp: 6 tablet, Rfl: 0   busPIRone (BUSPAR) 5 MG tablet, Take 5 mg by mouth 2 (two) times daily., Disp: , Rfl:    cetirizine (ZYRTEC) 10 MG tablet,  Take 10 mg by mouth at bedtime., Disp: , Rfl:    levothyroxine (SYNTHROID, LEVOTHROID) 75 MCG tablet, Take 75 mcg by mouth every other day. , Disp: , Rfl:    predniSONE (DELTASONE) 20 MG tablet, Take 2 tablets (40 mg total) by mouth daily with breakfast., Disp: 10 tablet, Rfl: 0   promethazine-dextromethorphan (PROMETHAZINE-DM) 6.25-15 MG/5ML syrup, Take 5 mLs by mouth 4 (four) times daily as needed for cough., Disp: 118 mL, Rfl: 0   rizatriptan (MAXALT-MLT) 10 MG disintegrating tablet, Take 1 tablet (10 mg total) by mouth as needed for migraine. May repeat in 2 hours if needed, Disp: 9 tablet, Rfl: 11   sertraline (ZOLOFT) 100 MG tablet, Take 100 mg by mouth at bedtime., Disp: , Rfl:   Observations/Objective: Patient is well-developed, well-nourished in no acute distress.  Resting comfortably at home.  Head is normocephalic, atraumatic.  No labored breathing. Speech is clear and coherent with logical content.  Patient is alert and oriented at baseline.   Assessment and Plan: 1. Moderate persistent asthma with exacerbation - predniSONE (DELTASONE) 20 MG tablet; Take 2 tablets (40 mg total) by mouth daily with breakfast.  Dispense: 10 tablet; Refill: 0  Secondary to current infection. Continue inhaler/nebulizer. Will add on 5-day burst of 40 mg prednisone to open airways and calm bronchospasm. Promethazine-DM per orders.   2. Acute bacterial bronchitis - promethazine-dextromethorphan (PROMETHAZINE-DM) 6.25-15 MG/5ML syrup; Take 5 mLs by mouth 4 (four) times daily as needed for cough.  Dispense: 118 mL; Refill: 0 - azithromycin (ZITHROMAX) 250 MG tablet; Take 2 tablets on day 1, then 1 tablet daily on days 2 through 5  Dispense: 6 tablet; Refill: 0  COVID PCR negative. Rx Azithromycin.  Increase fluids.  Rest.  Saline nasal spray.  Probiotic.  Mucinex as directed.  Humidifier in bedroom. Promethazine-DM per orders.  Call or return to clinic if symptoms are not improving.   Follow Up  Instructions: I discussed the assessment and treatment plan with the patient. The patient was provided an opportunity to ask questions and all were answered. The patient agreed with the plan and demonstrated an understanding of the instructions.  A copy of instructions were sent to the patient via MyChart unless otherwise noted below.   The patient was advised to call back or seek an in-person evaluation if the symptoms worsen or if the condition fails to improve as anticipated.  Time:  I spent 12 minutes with the patient via telehealth technology discussing the above problems/concerns.    Leeanne Rio, PA-C

## 2021-04-12 NOTE — Patient Instructions (Signed)
[name], thank you for joining Piedad Climes, PA-C for today's virtual visit.  While this provider is not your primary care provider (PCP), if your PCP is located in our provider database this encounter information will be shared with them immediately following your visit.  Consent: (Patient) [name] provided verbal consent for this virtual visit at the beginning of the encounter.  Current Medications:  Current Outpatient Medications:    HYDROcodone-acetaminophen (NORCO/VICODIN) 5-325 MG tablet, Take 1-2 tablets by mouth every 6 (six) hours as needed for moderate pain., Disp: 30 tablet, Rfl: 0   acetaminophen (TYLENOL) 500 MG tablet, Take 1,000 mg by mouth every 6 (six) hours as needed for mild pain, moderate pain or headache. , Disp: , Rfl:    albuterol (PROVENTIL HFA;VENTOLIN HFA) 108 (90 BASE) MCG/ACT inhaler, Inhale 2 puffs into the lungs every 6 (six) hours as needed for wheezing., Disp: , Rfl:    amoxicillin-clavulanate (AUGMENTIN) 875-125 MG tablet, Take 1 tablet by mouth 2 (two) times daily., Disp: 14 tablet, Rfl: 0   benzonatate (TESSALON PERLES) 100 MG capsule, Take 1 capsule (100 mg total) by mouth 3 (three) times daily as needed for cough., Disp: 20 capsule, Rfl: 0   busPIRone (BUSPAR) 5 MG tablet, Take 5 mg by mouth 2 (two) times daily., Disp: , Rfl:    cetirizine (ZYRTEC) 10 MG tablet, Take 10 mg by mouth at bedtime., Disp: , Rfl:    fluticasone (FLONASE) 50 MCG/ACT nasal spray, Place 2 sprays into both nostrils daily., Disp: 16 g, Rfl: 6   gabapentin (NEURONTIN) 300 MG capsule, Take 1 capsule (300 mg total) by mouth at bedtime., Disp: 30 capsule, Rfl: 1   levothyroxine (SYNTHROID, LEVOTHROID) 75 MCG tablet, Take 75 mcg by mouth every other day. , Disp: , Rfl:    ondansetron (ZOFRAN ODT) 8 MG disintegrating tablet, Take 1 tablet (8 mg total) by mouth every 8 (eight) hours as needed for nausea or vomiting., Disp: 20 tablet, Rfl: 0   ondansetron  (ZOFRAN) 4 MG tablet, Take 1 tablet (4 mg total) by mouth every 6 (six) hours as needed for nausea or vomiting., Disp: 12 tablet, Rfl: 0   predniSONE (DELTASONE) 20 MG tablet, Take 2 tablets (40 mg total) by mouth daily with breakfast., Disp: 10 tablet, Rfl: 0   rizatriptan (MAXALT-MLT) 10 MG disintegrating tablet, Take 1 tablet (10 mg total) by mouth as needed for migraine. May repeat in 2 hours if needed, Disp: 9 tablet, Rfl: 11   sertraline (ZOLOFT) 100 MG tablet, Take 100 mg by mouth at bedtime., Disp: , Rfl:    tiZANidine (ZANAFLEX) 4 MG tablet, Take 1 tablet (4 mg total) by mouth at bedtime., Disp: 20 tablet, Rfl: 0   Medications ordered in this encounter:  No orders of the defined types were placed in this encounter.    *If you need refills on other medications prior to your next appointment, please contact your pharmacy*  Follow-Up: Call back or seek an in-person evaluation if the symptoms worsen or if the condition fails to improve as anticipated.  Other Instructions Take antibiotic (Azithromycin) as directed.  Increase fluids.  Get plenty of rest. Use Mucinex for congestion. . Take a daily probiotic (I recommend Align or Culturelle, but even Activia Yogurt may be beneficial).  A humidifier placed in the bedroom may offer some relief for a dry, scratchy throat of nasal irritation.  Read information below on acute bronchitis. Please call or return to clinic if symptoms are  not improving.  Acute Bronchitis Bronchitis is when the airways that extend from the windpipe into the lungs get red, puffy, and painful (inflamed). Bronchitis often causes thick spit (mucus) to develop. This leads to a cough. A cough is the most common symptom of bronchitis. In acute bronchitis, the condition usually begins suddenly and goes away over time (usually in 2 weeks). Smoking, allergies, and asthma can make bronchitis worse. Repeated episodes of bronchitis may cause more lung problems.  HOME  CARE Rest. Drink enough fluids to keep your pee (urine) clear or pale yellow (unless you need to limit fluids as told by your doctor). Only take over-the-counter or prescription medicines as told by your doctor. Avoid smoking and secondhand smoke. These can make bronchitis worse. If you are a smoker, think about using nicotine gum or skin patches. Quitting smoking will help your lungs heal faster. Reduce the chance of getting bronchitis again by: Washing your hands often. Avoiding people with cold symptoms. Trying not to touch your hands to your mouth, nose, or eyes. Follow up with your doctor as told.  GET HELP IF: Your symptoms do not improve after 1 week of treatment. Symptoms include: Cough. Fever. Coughing up thick spit. Body aches. Chest congestion. Chills. Shortness of breath. Sore throat.  GET HELP RIGHT AWAY IF:  You have an increased fever. You have chills. You have severe shortness of breath. You have bloody thick spit (sputum). You throw up (vomit) often. You lose too much body fluid (dehydration). You have a severe headache. You faint.  MAKE SURE YOU:  Understand these instructions. Will watch your condition. Will get help right away if you are not doing well or get worse. Document Released: 08/07/2007 Document Revised: 10/21/2012 Document Reviewed: 08/11/2012 [facility] Patient Information 2015 [place], [place]. This information is not intended to replace advice given to you by your health care provider. Make sure you discuss any questions you have with your health care provider.    If you have been instructed to have an in-person evaluation today at a local Urgent Care facility, please use the link below. It will take you to a list of all of our available [facility], including address, phone number and hours of operation. Please do not delay care.  [facility]  If you or a family member do not have a primary care provider, use the  link below to schedule a visit and establish care. When you choose a [facility] primary care physician or advanced practice provider, you gain a long-term partner in health. Find a Primary Care Provider  Learn more about [facility]'s in-office and virtual care options:  - Get Care Now

## 2021-06-06 ENCOUNTER — Ambulatory Visit (HOSPITAL_COMMUNITY)
Admission: EM | Admit: 2021-06-06 | Discharge: 2021-06-06 | Disposition: A | Discharge status: Home / Self Care | Payer: 59 | Attending: Family Medicine | Admitting: Family Medicine

## 2021-06-06 ENCOUNTER — Encounter (HOSPITAL_COMMUNITY): Payer: Self-pay | Admitting: Emergency Medicine

## 2021-06-06 DIAGNOSIS — R109 Unspecified abdominal pain: Secondary | ICD-10-CM

## 2021-06-06 DIAGNOSIS — R3129 Other microscopic hematuria: Secondary | ICD-10-CM | POA: Diagnosis not present

## 2021-06-06 LAB — BASIC METABOLIC PANEL
Anion gap: 7 (ref 5–15)
BUN: 9 mg/dL (ref 6–20)
CO2: 24 mmol/L (ref 22–32)
Calcium: 9.1 mg/dL (ref 8.9–10.3)
Chloride: 107 mmol/L (ref 98–111)
Creatinine, Ser: 0.7 mg/dL (ref 0.44–1.00)
GFR, Estimated: >60 mL/min (ref >60)
Glucose, Bld: 94 mg/dL (ref 70–99)
Potassium: 3.6 mmol/L (ref 3.5–5.1)
Sodium: 138 mmol/L (ref 135–145)

## 2021-06-06 LAB — POCT URINALYSIS DIPSTICK, ED / UC
Bilirubin Urine: NEGATIVE
Glucose, UA: NEGATIVE mg/dL
Ketones, ur: NEGATIVE mg/dL
Leukocytes,Ua: NEGATIVE
Nitrite: NEGATIVE
Protein, ur: NEGATIVE mg/dL
Specific Gravity, Urine: 1.015 (ref 1.005–1.030)
Urobilinogen, UA: 0.2 mg/dL (ref 0.0–1.0)
pH: 7 (ref 5.0–8.0)

## 2021-06-06 MED ORDER — ONDANSETRON 4 MG PO TBDP: 4.0000 mg | ORAL_TABLET | Freq: Three times a day (TID) | ORAL | 0 refills | Status: DC | PRN

## 2021-06-06 MED ORDER — HYDROCODONE-ACETAMINOPHEN 5-325 MG PO TABS: 1.0000 | ORAL_TABLET | Freq: Four times a day (QID) | ORAL | 0 refills | Status: DC | PRN

## 2021-06-06 MED ORDER — KETOROLAC TROMETHAMINE 60 MG/2ML IM SOLN
INTRAMUSCULAR | Status: AC
  Filled 2021-06-06: qty 2

## 2021-06-06 MED ORDER — KETOROLAC TROMETHAMINE 60 MG/2ML IM SOLN
60.0000 mg | Freq: Once | INTRAMUSCULAR | Status: AC
  Administered 2021-06-06: 60 mg via INTRAMUSCULAR

## 2021-06-06 NOTE — ED Provider Notes (Signed)
?[facility] ? ? ?[id number] ?06/06/21 Arrival Time: [id number] ? ?ASSESSMENT & PLAN: ? ?1. Left flank pain   ?2. Other microscopic hematuria   ? ?Likely kidney stone. BMP pending to check Cr. ?Benign abdominal exam.  ? ?Labs Reviewed  ?POCT URINALYSIS DIPSTICK, ED / UC - Abnormal; Notable for the following components:  ?    Result Value  ? Hgb urine dipstick MODERATE (*)   ? All other components within normal limits  ?BASIC METABOLIC PANEL  ? ?Meds ordered this encounter  ?Medications  ? ketorolac (TORADOL) injection 60 mg  ? ondansetron (ZOFRAN-ODT) 4 MG disintegrating tablet  ?  Sig: Take 1 tablet (4 mg total) by mouth every 8 (eight) hours as needed for nausea or vomiting.  ?  Dispense:  15 tablet  ?  Refill:  0  ? HYDROcodone-acetaminophen (NORCO/VICODIN) 5-325 MG tablet  ?  Sig: Take 1 tablet by mouth every 6 (six) hours as needed for moderate pain or severe pain.  ?  Dispense:  12 tablet  ?  Refill:  0  ? ? ? ?Discharge Instructions   ? ?  ?Be aware, you have been prescribed pain medications that may cause drowsiness. While taking this medication, do not take any other medications containing acetaminophen (Tylenol). Do not combine with alcohol or other illicit drugs. Please do not drive, operate heavy machinery, or take part in activities that require making important decisions while on this medication as your judgement may be clouded. ? ?You have had labs (blood work) drawn today. We will call you with any significant abnormalities or if there is need to begin or change treatment or pursue further follow up. ? ?You may also review your test results online through [facility]. If you do not have a MyChart account, instructions to sign up should be on your discharge paperwork. ? ? ? ? Follow-up Information   ? ? [facility].   ?Specialty: Emergency Medicine ?Why: If symptoms worsen in any way. ?Contact information: ?[address] ?[id number] mc ?[place] ?[phone] ? ?  ?  ? ?  ?  ? ?  ? ? ?Reviewed expectations re: course of current medical issues. Questions answered. ?Outlined signs and symptoms indicating need for more acute intervention. ?Patient verbalized understanding. ?After Visit Summary given. ? ? ?SUBJECTIVE: ?History from: patient. ?Megan Lara is a 41 y.o. female who presents with complaint of L flank pain; abrupt onset; today; intermittent; h/o kidney stone with same symptoms; required lithotripsy. Normal PO intake without v/d. Slight nausea when pain present.  ? ?No tx PTA. ? ?Patient's last menstrual period was 05/11/2021. ? ?Past Surgical History:  ?Procedure Laterality Date  ? CESAREAN SECTION N/A 12/16/2016  ? Procedure: CESAREAN SECTION;  Surgeon: Azucena Fallen, MD;  Location: [facility];  Service: Obstetrics;  Laterality: N/A;  ? CESAREAN SECTION WITH BILATERAL TUBAL LIGATION Bilateral 05/25/2018  ? Procedure: Repeat CESAREAN SECTION WITH BILATERAL TUBAL LIGATION;  Surgeon: Azucena Fallen, MD;  Location: [facility];  Service: Obstetrics;  Laterality: Bilateral;  EDD: 05/22/18  ? EXTRACORPOREAL SHOCK WAVE LITHOTRIPSY Right 06/24/2019  ? Procedure: EXTRACORPOREAL SHOCK WAVE LITHOTRIPSY (ESWL);  Surgeon: Kathie Rhodes, MD;  Location: [facility];  Service: Urology;  Laterality: Right;  75 MINS  ? GANGLION CYST EXCISION Left 4/15  ? 3 rd toe  ? HERNIA REPAIR Right 1991  ? IR ANGIOGRAM PELVIS SELECTIVE OR SUPRASELECTIVE  05/28/2018  ? IR ANGIOGRAM SELECTIVE EACH ADDITIONAL  VESSEL  05/28/2018  ? IR ANGIOGRAM SELECTIVE EACH ADDITIONAL VESSEL  05/28/2018  ? IR ANGIOGRAM SELECTIVE EACH ADDITIONAL VESSEL  05/28/2018  ? IR EMBO ART  VEN HEMORR LYMPH EXTRAV  INC GUIDE ROADMAPPING  05/28/2018  ? IR US GUIDE VASC ACCESS RIGHT  05/28/2018  ? TEE WITHOUT CARDIOVERSION N/A 06/30/2019  ? Procedure: TRANSESOPHAGEAL ECHOCARDIOGRAM (TEE);  Surgeon: Pixie Casino, MD;  Location[facility];  Service: Cardiovascular;   Laterality: N/A;  ? TONSILLECTOMY  1997  ? ? ? ?OBJECTIVE: ? ?Vitals:  ? 06/06/21 1514  ?BP: 119/63  ?Pulse: 79  ?Resp: 17  ?Temp: 98.7 ?F (37.1 ?C)  ?TempSrc: Oral  ?SpO2: 99%  ?  ?General appearance: alert, oriented, no acute distress ?Lungs: unlabored respirations ?Abdomen: soft; benign ?Back: with reported LEFT CVA tenderness; FROM at waist ?Extremities: without LE edema; symmetrical; without gross deformities ?Skin: warm and dry ?Neurologic: normal gait ?Psychological: alert and cooperative; normal mood and affect ? ?Labs: ?Results for orders placed or performed during the hospital encounter of 06/06/21  ?POC Urinalysis dipstick  ?Result Value Ref Range  ? Glucose, UA NEGATIVE NEGATIVE mg/dL  ? Bilirubin Urine NEGATIVE NEGATIVE  ? Ketones, ur NEGATIVE NEGATIVE mg/dL  ? Specific Gravity, Urine 1.015 1.005 - 1.030  ? Hgb urine dipstick MODERATE (A) NEGATIVE  ? pH 7.0 5.0 - 8.0  ? Protein, ur NEGATIVE NEGATIVE mg/dL  ? Urobilinogen, UA 0.2 0.0 - 1.0 mg/dL  ? Nitrite NEGATIVE NEGATIVE  ? Leukocytes,Ua NEGATIVE NEGATIVE  ? ?Labs Reviewed  ?POCT URINALYSIS DIPSTICK, ED / UC - Abnormal; Notable for the following components:  ?    Result Value  ? Hgb urine dipstick MODERATE (*)   ? All other components within normal limits  ?BASIC METABOLIC PANEL  ? ? ?Imaging: ?No results found.  ? ?No Known Allergies ?                                            ?Past Medical History:  ?Diagnosis Date  ? Adjustment disorder with mixed anxiety and depressed mood 12/17/2016  ? Anemia   ? Anemia affecting pregnancy, antepartum 12/17/2016  ? Asthma   ? Chorioamnionitis, delivered, current hospitalization 12/17/2016  ? Depression   ? Hx of cellulitis of skin with lymphangitis   ? from last CS  ? Hypothyroidism 12/17/2016  ? Thyroid dysfunction in pregnancy in third trimester   ? ? ?Social History  ? ?Socioeconomic History  ? Marital status: Married  ?  Spouse name: Not on file  ? Number of children: 0  ? Years of education: Not on file  ?  Highest education level: Not on file  ?Occupational History  ? Not on file  ?Tobacco Use  ? Smoking status: Former  ?  Packs/day: 0.50  ?  Years: 7.00  ?  Pack years: 3.50  ?  Types: Cigarettes  ?  Quit date: 03/04/2013  ?  Years since quitting: 8.2  ? Smokeless tobacco: Never  ? Tobacco comments:  ?  does some vape  ?Vaping Use  ? Vaping Use: Never used  ?Substance and Sexual Activity  ? Alcohol use: Not Currently  ?  Alcohol/week: 0.0 standard drinks  ?  Comment: 9 a week  ? Drug use: No  ? Sexual activity: Not Currently  ?  Partners: Male  ?  Birth control/protection: None  ?Other Topics Concern  ?  Not on file  ?Social History Narrative  ? Not on file  ? ?Social Determinants of Health  ? ?Financial Resource Strain: Not on file  ?Food Insecurity: Not on file  ?Transportation Needs: Not on file  ?Physical Activity: Not on file  ?Stress: Not on file  ?Social Connections: Not on file  ?Intimate Partner Violence: Not on file  ? ? ?Family History  ?Adopted: Yes  ?Problem Relation Age of Onset  ? Uterine cancer Maternal Grandmother   ? ?  ?Vanessa Kick, MD ?06/06/21 1609 ? ?

## 2021-06-06 NOTE — ED Triage Notes (Signed)
Pt reports left flank pain and pain in bladder that started yesterday but eased off. Reports flared back up today around lunch time. Hx kidney stone.  ?

## 2021-06-06 NOTE — Discharge Instructions (Signed)
Be aware, you have been prescribed pain medications that may cause drowsiness. While taking this medication, do not take any other medications containing acetaminophen (Tylenol). Do not combine with alcohol or other illicit drugs. Please do not drive, operate heavy machinery, or take part in activities that require making important decisions while on this medication as your judgement may be clouded. ? ?You have had labs (blood work) drawn today. We will call you with any significant abnormalities or if there is need to begin or change treatment or pursue further follow up. ? ?You may also review your test results online through MyChart. If you do not have a MyChart account, instructions to sign up should be on your discharge paperwork. ? ?

## 2021-06-13 ENCOUNTER — Telehealth: Payer: 59 | Admitting: Physician Assistant

## 2021-06-13 DIAGNOSIS — B9689 Other specified bacterial agents as the cause of diseases classified elsewhere: Secondary | ICD-10-CM | POA: Diagnosis not present

## 2021-06-13 DIAGNOSIS — J019 Acute sinusitis, unspecified: Secondary | ICD-10-CM

## 2021-06-13 DIAGNOSIS — H6691 Otitis media, unspecified, right ear: Secondary | ICD-10-CM | POA: Diagnosis not present

## 2021-06-13 MED ORDER — AMOXICILLIN-POT CLAVULANATE 875-125 MG PO TABS: 1.0000 | ORAL_TABLET | Freq: Two times a day (BID) | ORAL | 0 refills | Status: DC

## 2021-06-13 NOTE — Patient Instructions (Signed)
Megan BrilliantAlaina Nicole Lara, thank you for joining Margaretann LovelessJennifer M , PA-C for today's virtual visit.  While this provider is not your primary care provider (PCP), if your PCP is located in our provider database this encounter information will be shared with them immediately following your visit. ? ?Consent: ?(Patient) Megan BrilliantAlaina Nicole Lara provided verbal consent for this virtual visit at the beginning of the encounter. ? ?Current Medications: ? ?Current Outpatient Medications:  ?  amoxicillin-clavulanate (AUGMENTIN) 875-125 MG tablet, Take 1 tablet by mouth 2 (two) times daily., Disp: 20 tablet, Rfl: 0 ?  albuterol (PROVENTIL HFA;VENTOLIN HFA) 108 (90 BASE) MCG/ACT inhaler, Inhale 2 puffs into the lungs every 6 (six) hours as needed for wheezing., Disp: , Rfl:  ?  busPIRone (BUSPAR) 5 MG tablet, Take 5 mg by mouth 2 (two) times daily., Disp: , Rfl:  ?  cetirizine (ZYRTEC) 10 MG tablet, Take 10 mg by mouth at bedtime., Disp: , Rfl:  ?  HYDROcodone-acetaminophen (NORCO/VICODIN) 5-325 MG tablet, Take 1 tablet by mouth every 6 (six) hours as needed for moderate pain or severe pain., Disp: 12 tablet, Rfl: 0 ?  levothyroxine (SYNTHROID, LEVOTHROID) 75 MCG tablet, Take 75 mcg by mouth every other day. , Disp: , Rfl:  ?  ondansetron (ZOFRAN-ODT) 4 MG disintegrating tablet, Take 1 tablet (4 mg total) by mouth every 8 (eight) hours as needed for nausea or vomiting., Disp: 15 tablet, Rfl: 0 ?  rizatriptan (MAXALT-MLT) 10 MG disintegrating tablet, Take 1 tablet (10 mg total) by mouth as needed for migraine. May repeat in 2 hours if needed, Disp: 9 tablet, Rfl: 11 ?  sertraline (ZOLOFT) 100 MG tablet, Take 100 mg by mouth at bedtime., Disp: , Rfl:   ? ?Medications ordered in this encounter:  ?Meds ordered this encounter  ?Medications  ? amoxicillin-clavulanate (AUGMENTIN) 875-125 MG tablet  ?  Sig: Take 1 tablet by mouth 2 (two) times daily.  ?  Dispense:  20 tablet  ?  Refill:  0  ?  Order Specific Question:   Supervising  Provider  ?  Answer:   Eber HongMILLER, BRIAN [[id number]]  ?  ? ?*If you need refills on other medications prior to your next appointment, please contact your pharmacy* ? ?Follow-Up: ?Call back or seek an in-person evaluation if the symptoms worsen or if the condition fails to improve as anticipated. ? ?Other Instructions ? ?Sinusitis, Adult ?Sinusitis is inflammation of your sinuses. Sinuses are hollow spaces in the bones around your face. Your sinuses are located: ?Around your eyes. ?In the middle of your forehead. ?Behind your nose. ?In your cheekbones. ?Mucus normally drains out of your sinuses. When your nasal tissues become inflamed or swollen, mucus can become trapped or blocked. This allows bacteria, viruses, and fungi to grow, which leads to infection. Most infections of the sinuses are caused by a virus. ?Sinusitis can develop quickly. It can last for up to 4 weeks (acute) or for more than 12 weeks (chronic). Sinusitis often develops after a cold. ?What are the causes? ?This condition is caused by anything that creates swelling in the sinuses or stops mucus from draining. This includes: ?Allergies. ?Asthma. ?Infection from bacteria or viruses. ?Deformities or blockages in your nose or sinuses. ?Abnormal growths in the nose (nasal polyps). ?Pollutants, such as chemicals or irritants in the air. ?Infection from fungi (rare). ?What increases the risk? ?You are more likely to develop this condition if you: ?Have a weak body defense system (immune system). ?Do a lot of swimming or diving. ?Overuse  nasal sprays. ?Smoke. ?What are the signs or symptoms? ?The main symptoms of this condition are pain and a feeling of pressure around the affected sinuses. Other symptoms include: ?Stuffy nose or congestion. ?Thick drainage from your nose. ?Swelling and warmth over the affected sinuses. ?Headache. ?Upper toothache. ?A cough that may get worse at night. ?Extra mucus that collects in the throat or the back of the nose (postnasal  drip). ?Decreased sense of smell and taste. ?Fatigue. ?A fever. ?Sore throat. ?Bad breath. ?How is this diagnosed? ?This condition is diagnosed based on: ?Your symptoms. ?Your medical history. ?A physical exam. ?Tests to find out if your condition is acute or chronic. This may include: ?Checking your nose for nasal polyps. ?Viewing your sinuses using a device that has a light (endoscope). ?Testing for allergies or bacteria. ?Imaging tests, such as an MRI or CT scan. ?In rare cases, a bone biopsy may be done to rule out more serious types of fungal sinus disease. ?How is this treated? ?Treatment for sinusitis depends on the cause and whether your condition is chronic or acute. ?If caused by a virus, your symptoms should go away on their own within 10 days. You may be given medicines to relieve symptoms. They include: ?Medicines that shrink swollen nasal passages (topical intranasal decongestants). ?Medicines that treat allergies (antihistamines). ?A spray that eases inflammation of the nostrils (topical intranasal corticosteroids). ?Rinses that help get rid of thick mucus in your nose (nasal saline washes). ?If caused by bacteria, your health care provider may recommend waiting to see if your symptoms improve. Most bacterial infections will get better without antibiotic medicine. You may be given antibiotics if you have: ?A severe infection. ?A weak immune system. ?If caused by narrow nasal passages or nasal polyps, you may need to have surgery. ?Follow these instructions at home: ?Medicines ?Take, use, or apply over-the-counter and prescription medicines only as told by your health care provider. These may include nasal sprays. ?If you were prescribed an antibiotic medicine, take it as told by your health care provider. Do not stop taking the antibiotic even if you start to feel better. ?Hydrate and humidify ? ?Drink enough fluid to keep your urine pale yellow. Staying hydrated will help to thin your mucus. ?Use a  cool mist humidifier to keep the humidity level in your home above 50%. ?Inhale steam for 10-15 minutes, 3-4 times a day, or as told by your health care provider. You can do this in the bathroom while a hot shower is running. ?Limit your exposure to cool or dry air. ?Rest ?Rest as much as possible. ?Sleep with your head raised (elevated). ?Make sure you get enough sleep each night. ?General instructions ? ?Apply a warm, moist washcloth to your face 3-4 times a day or as told by your health care provider. This will help with discomfort. ?Wash your hands often with soap and water to reduce your exposure to germs. If soap and water are not available, use hand sanitizer. ?Do not smoke. Avoid being around people who are smoking (secondhand smoke). ?Keep all follow-up visits as told by your health care provider. This is important. ?Contact a health care provider if: ?You have a fever. ?Your symptoms get worse. ?Your symptoms do not improve within 10 days. ?Get help right away if: ?You have a severe headache. ?You have persistent vomiting. ?You have severe pain or swelling around your face or eyes. ?You have vision problems. ?You develop confusion. ?Your neck is stiff. ?You have trouble breathing. ?Summary ?Sinusitis  is soreness and inflammation of your sinuses. Sinuses are hollow spaces in the bones around your face. ?This condition is caused by nasal tissues that become inflamed or swollen. The swelling traps or blocks the flow of mucus. This allows bacteria, viruses, and fungi to grow, which leads to infection. ?If you were prescribed an antibiotic medicine, take it as told by your health care provider. Do not stop taking the antibiotic even if you start to feel better. ?Keep all follow-up visits as told by your health care provider. This is important. ?This information is not intended to replace advice given to you by your health care provider. Make sure you discuss any questions you have with your health care  provider. ?Document Revised: 07/21/2017 Document Reviewed: 07/21/2017 ?Elsevier Patient Education ? 2022 Elsevier [employer]. ? ? ? ?If you have been instructed to have an in-person evaluation today at a local Urgent Care

## 2021-06-13 NOTE — Progress Notes (Signed)
?Virtual Visit Consent  ? ?[name], you are scheduled for a virtual visit with a [facility] provider today.   ?  ?Just as with appointments in the office, your consent must be obtained to participate.  Your consent will be active for this visit and any virtual visit you may have with one of our providers in the next 365 days.   ?  ?If you have a MyChart account, a copy of this consent can be sent to you electronically.  All virtual visits are billed to your insurance company just like a traditional visit in the office.   ? ?As this is a virtual visit, video technology does not allow for your provider to perform a traditional examination.  This may limit your provider's ability to fully assess your condition.  If your provider identifies any concerns that need to be evaluated in person or the need to arrange testing (such as labs, EKG, etc.), we will make arrangements to do so.   ?  ?Although advances in technology are sophisticated, we cannot ensure that it will always work on either your end or our end.  If the connection with a video visit is poor, the visit may have to be switched to a telephone visit.  With either a video or telephone visit, we are not always able to ensure that we have a secure connection.    ? ?I need to obtain your verbal consent now.   Are you willing to proceed with your visit today?  ?  ?Savannha Yena Tisby has provided verbal consent on 06/13/2021 for a virtual visit (video or telephone). ?  ?Margaretann Loveless, PA-C  ? ?Date: 06/13/2021 2:51 PM ? ? ?Virtual Visit via Video Note  ? ?Delmer Islam, connected with  Lakelynn Severtson  ([id number], January 22, 1981) on 06/13/21 at  2:45 PM EDT by a video-enabled telemedicine application and verified that I am speaking with the correct person using two identifiers. ? ?Location: ?Patient: Virtual Visit Location Patient: Home ?Provider: Virtual Visit Location Provider: Home Office ?  ?I discussed the limitations of  evaluation and management by telemedicine and the availability of in person appointments. The patient expressed understanding and agreed to proceed.   ? ?History of Present Illness: ?Orlanda Debby Clyne is a 41 y.o. who identifies as a female who was assigned female at birth, and is being seen today for possible sinus infection. ? ?HPI: Sinusitis ?This is a new problem. The current episode started in the past 7 days (Friday last week). The problem has been gradually worsening since onset. Maximum temperature: subjective fevers. The pain is mild. Associated symptoms include chills, congestion, coughing, ear pain (fullness), headaches, neck pain, shortness of breath, sinus pressure and a sore throat (when first started but now improving, ended Sunday). (Hoarse voice) Treatments tried: Mucinex DM, asthma, decongestant, and Ibuprofen. The treatment provided no relief.   ?Went to [facility] at [facility] on Monday and was tested for flu, covid, and RSV all negative. ? ?Problems:  ?Patient Active Problem List  ? Diagnosis Date Noted  ? Complicated UTI (urinary tract infection) 07/01/2019  ? Fever 06/26/2019  ? Leukocytosis 06/26/2019  ? Sepsis (HCC) 06/26/2019  ? Meningitis 03/23/2019  ? Hematoma of rectus sheath 05/28/2018  ? Status post repeat low transverse cesarean section and BTL 05/25/2018  ? Postpartum care following cesarean delivery (3/23) 05/25/2018  ? Carpal tunnel syndrome, left upper limb 01/13/2017  ? Carpal tunnel syndrome, right upper limb 01/13/2017  ?  Hypothyroidism 12/17/2016  ? Adjustment disorder with mixed anxiety and depressed mood 12/17/2016  ?  ?Allergies: No Known Allergies ?Medications:  ?Current Outpatient Medications:  ?  amoxicillin-clavulanate (AUGMENTIN) 875-125 MG tablet, Take 1 tablet by mouth 2 (two) times daily., Disp: 20 tablet, Rfl: 0 ?  albuterol (PROVENTIL HFA;VENTOLIN HFA) 108 (90 BASE) MCG/ACT inhaler, Inhale 2 puffs into the lungs every 6 (six) hours as needed for  wheezing., Disp: , Rfl:  ?  busPIRone (BUSPAR) 5 MG tablet, Take 5 mg by mouth 2 (two) times daily., Disp: , Rfl:  ?  cetirizine (ZYRTEC) 10 MG tablet, Take 10 mg by mouth at bedtime., Disp: , Rfl:  ?  HYDROcodone-acetaminophen (NORCO/VICODIN) 5-325 MG tablet, Take 1 tablet by mouth every 6 (six) hours as needed for moderate pain or severe pain., Disp: 12 tablet, Rfl: 0 ?  levothyroxine (SYNTHROID, LEVOTHROID) 75 MCG tablet, Take 75 mcg by mouth every other day. , Disp: , Rfl:  ?  ondansetron (ZOFRAN-ODT) 4 MG disintegrating tablet, Take 1 tablet (4 mg total) by mouth every 8 (eight) hours as needed for nausea or vomiting., Disp: 15 tablet, Rfl: 0 ?  rizatriptan (MAXALT-MLT) 10 MG disintegrating tablet, Take 1 tablet (10 mg total) by mouth as needed for migraine. May repeat in 2 hours if needed, Disp: 9 tablet, Rfl: 11 ?  sertraline (ZOLOFT) 100 MG tablet, Take 100 mg by mouth at bedtime., Disp: , Rfl:  ? ?Observations/Objective: ?Patient is well-developed, well-nourished in no acute distress.  ?Resting comfortably at home.  ?Head is normocephalic, atraumatic.  ?No labored breathing.  ?Speech is clear and coherent with logical content.  ?Patient is alert and oriented at baseline.  ? ? ?Assessment and Plan: ?1. Acute bacterial sinusitis ?- amoxicillin-clavulanate (AUGMENTIN) 875-125 MG tablet; Take 1 tablet by mouth 2 (two) times daily.  Dispense: 20 tablet; Refill: 0 ? ?2. Right otitis media, unspecified otitis media type ?- amoxicillin-clavulanate (AUGMENTIN) 875-125 MG tablet; Take 1 tablet by mouth 2 (two) times daily.  Dispense: 20 tablet; Refill: 0 ? ?- Worsening symptoms that have not responded to OTC medications.  ?- Will give augmentin  ?- Continue allergy medications.  ?- Steam and Humidifier can help ?- Can continue heat as needed ?- Stay well hydrated and get plenty of rest.  ?- Seek in person evaluation if no symptom improvement or if symptoms worsen. ? ? ?Follow Up Instructions: ?I discussed the  assessment and treatment plan with the patient. The patient was provided an opportunity to ask questions and all were answered. The patient agreed with the plan and demonstrated an understanding of the instructions.  A copy of instructions were sent to the patient via MyChart unless otherwise noted below.  ? ? ?The patient was advised to call back or seek an in-person evaluation if the symptoms worsen or if the condition fails to improve as anticipated. ? ?Time:  ?I spent 12 minutes with the patient via telehealth technology discussing the above problems/concerns.   ? ?Margaretann Loveless, PA-C ?

## 2021-06-25 ENCOUNTER — Other Ambulatory Visit: Payer: Self-pay

## 2021-06-25 ENCOUNTER — Encounter (HOSPITAL_COMMUNITY): Payer: Self-pay | Admitting: Emergency Medicine

## 2021-06-25 ENCOUNTER — Ambulatory Visit (HOSPITAL_COMMUNITY)
Admission: EM | Admit: 2021-06-25 | Discharge: 2021-06-25 | Disposition: A | Discharge status: Home / Self Care | Payer: 59 | Attending: Internal Medicine | Admitting: Internal Medicine

## 2021-06-25 DIAGNOSIS — Z20822 Contact with and (suspected) exposure to covid-19: Secondary | ICD-10-CM | POA: Diagnosis not present

## 2021-06-25 DIAGNOSIS — B349 Viral infection, unspecified: Secondary | ICD-10-CM

## 2021-06-25 DIAGNOSIS — R519 Headache, unspecified: Secondary | ICD-10-CM | POA: Insufficient documentation

## 2021-06-25 DIAGNOSIS — R509 Fever, unspecified: Secondary | ICD-10-CM | POA: Diagnosis present

## 2021-06-25 DIAGNOSIS — J029 Acute pharyngitis, unspecified: Secondary | ICD-10-CM | POA: Insufficient documentation

## 2021-06-25 LAB — POCT INFECTIOUS MONO SCREEN, ED / UC: Mono Screen: NEGATIVE

## 2021-06-25 NOTE — ED Triage Notes (Addendum)
Complains of fever, chills, body aches and sore throat for 2 days ? ?Took last augmentin dose on Saturday.  Reports was being treated for sinus infection ?

## 2021-06-25 NOTE — Discharge Instructions (Addendum)
Maintain adequate hydration ?Tylenol/Motrin as needed for pain and/or fever ?Mono screen is negative ?We will call you with COVID-19 test if positive ?Return to urgent care if symptoms worsen ?Use your inhaler if you develop any chest tightness or wheezing. ?

## 2021-06-26 LAB — SARS CORONAVIRUS 2 (TAT 6-24 HRS): SARS Coronavirus 2: NEGATIVE

## 2021-06-26 NOTE — ED Provider Notes (Signed)
?[place] ? ? ? ?CSN: [id number] ?Arrival date & time: 06/25/21  1800 ? ? ?  ? ?History   ?Chief Complaint ?Chief Complaint  ?Patient presents with  ? Fever  ?  Chills, body aches, sore throat, bad headache x2days - Entered by patient  ? ? ?HPI ?Megan Lara is a 41 y.o. female comes to the urgent care with fever, chills, generalized body aches and sore throat of 2 days duration.  Patient completed a course of Augmentin for bacterial sinus infection.  She completed the Augmentin a couple of days ago.  She endorses sick contacts at home.  No nausea no vomiting. ?.  Patient denies any cough or sputum production.  No chest tightness or generalized body aches.  Oral intake is maintained..  ? ?HPI ? ?Past Medical History:  ?Diagnosis Date  ? Adjustment disorder with mixed anxiety and depressed mood 12/17/2016  ? Anemia   ? Anemia affecting pregnancy, antepartum 12/17/2016  ? Asthma   ? Chorioamnionitis, delivered, current hospitalization 12/17/2016  ? Depression   ? Hx of cellulitis of skin with lymphangitis   ? from last CS  ? Hypothyroidism 12/17/2016  ? Thyroid dysfunction in pregnancy in third trimester   ? ? ?Patient Active Problem List  ? Diagnosis Date Noted  ? Complicated UTI (urinary tract infection) 07/01/2019  ? Fever 06/26/2019  ? Leukocytosis 06/26/2019  ? Sepsis ([place]) 06/26/2019  ? Meningitis 03/23/2019  ? Hematoma of rectus sheath 05/28/2018  ? Status post repeat low transverse cesarean section and BTL 05/25/2018  ? Postpartum care following cesarean delivery (3/23) 05/25/2018  ? Carpal tunnel syndrome, left upper limb 01/13/2017  ? Carpal tunnel syndrome, right upper limb 01/13/2017  ? Hypothyroidism 12/17/2016  ? Adjustment disorder with mixed anxiety and depressed mood 12/17/2016  ? ? ?Past Surgical History:  ?Procedure Laterality Date  ? CESAREAN SECTION N/A 12/16/2016  ? Procedure: CESAREAN SECTION;  Surgeon: Azucena Fallen, MD;  Location: [facility];  Service:  Obstetrics;  Laterality: N/A;  ? CESAREAN SECTION WITH BILATERAL TUBAL LIGATION Bilateral 05/25/2018  ? Procedure: Repeat CESAREAN SECTION WITH BILATERAL TUBAL LIGATION;  Surgeon: Azucena Fallen, MD;  Location: [facility];  Service: Obstetrics;  Laterality: Bilateral;  EDD: 05/22/18  ? EXTRACORPOREAL SHOCK WAVE LITHOTRIPSY Right 06/24/2019  ? Procedure: EXTRACORPOREAL SHOCK WAVE LITHOTRIPSY (ESWL);  Surgeon: Kathie Rhodes, MD;  Location: [facility];  Service: Urology;  Laterality: Right;  75 MINS  ? GANGLION CYST EXCISION Left 4/15  ? 3 rd toe  ? HERNIA REPAIR Right 1991  ? IR ANGIOGRAM PELVIS SELECTIVE OR SUPRASELECTIVE  05/28/2018  ? IR ANGIOGRAM SELECTIVE EACH ADDITIONAL VESSEL  05/28/2018  ? IR ANGIOGRAM SELECTIVE EACH ADDITIONAL VESSEL  05/28/2018  ? IR ANGIOGRAM SELECTIVE EACH ADDITIONAL VESSEL  05/28/2018  ? IR EMBO ART  VEN HEMORR LYMPH EXTRAV  INC GUIDE ROADMAPPING  05/28/2018  ? IR US GUIDE VASC ACCESS RIGHT  05/28/2018  ? TEE WITHOUT CARDIOVERSION N/A 06/30/2019  ? Procedure: TRANSESOPHAGEAL ECHOCARDIOGRAM (TEE);  Surgeon: Pixie Casino, MD;  Location[facility];  Service: Cardiovascular;  Laterality: N/A;  ? TONSILLECTOMY  1997  ? ? ?OB History   ? ? Gravida  ?2  ? Para  ?2  ? Term  ?2  ? Preterm  ?   ? AB  ?   ? Living  ?1  ?  ? ? SAB  ?   ? IAB  ?   ? Ectopic  ?   ?  Multiple  ?0  ? Live Births  ?1  ?   ?  ?  ? ? ? ?Home Medications   ? ?Prior to Admission medications   ?Medication Sig Start Date End Date Taking? Authorizing Provider  ?albuterol (PROVENTIL HFA;VENTOLIN HFA) 108 (90 BASE) MCG/ACT inhaler Inhale 2 puffs into the lungs every 6 (six) hours as needed for wheezing.    [provider]  ?amoxicillin-clavulanate (AUGMENTIN) 875-125 MG tablet Take 1 tablet by mouth 2 (two) times daily. 06/13/21   Mar Daring, PA-C  ?busPIRone (BUSPAR) 5 MG tablet Take 5 mg by mouth 2 (two) times daily.    Emergency, Nurse, RN  ?cetirizine (ZYRTEC) 10 MG tablet Take 10 mg by mouth at  bedtime.    [provider]  ?HYDROcodone-acetaminophen (NORCO/VICODIN) 5-325 MG tablet Take 1 tablet by mouth every 6 (six) hours as needed for moderate pain or severe pain. ?Patient not taking: Reported on 06/25/2021 06/06/21   Vanessa Kick, MD  ?levothyroxine (SYNTHROID, LEVOTHROID) 75 MCG tablet Take 75 mcg by mouth every other day.     [provider]  ?ondansetron (ZOFRAN-ODT) 4 MG disintegrating tablet Take 1 tablet (4 mg total) by mouth every 8 (eight) hours as needed for nausea or vomiting. ?Patient not taking: Reported on 06/25/2021 06/06/21   Vanessa Kick, MD  ?rizatriptan (MAXALT-MLT) 10 MG disintegrating tablet Take 1 tablet (10 mg total) by mouth as needed for migraine. May repeat in 2 hours if needed 07/05/19   Lomax, Amy, NP  ?sertraline (ZOLOFT) 100 MG tablet Take 100 mg by mouth at bedtime.    [provider]  ? ? ?Family History ?Family History  ?Adopted: Yes  ?Problem Relation Age of Onset  ? Uterine cancer Maternal Grandmother   ? ? ?Social History ?Social History  ? ?Tobacco Use  ? Smoking status: Former  ?  Packs/day: 0.50  ?  Years: 7.00  ?  Pack years: 3.50  ?  Types: Cigarettes  ?  Quit date: 03/04/2013  ?  Years since quitting: 8.3  ? Smokeless tobacco: Never  ? Tobacco comments:  ?  does some vape  ?Vaping Use  ? Vaping Use: Never used  ?Substance Use Topics  ? Alcohol use: Not Currently  ?  Alcohol/week: 0.0 standard drinks  ?  Comment: 9 a week  ? Drug use: No  ? ? ? ?Allergies   ?Patient has no known allergies. ? ? ?Review of Systems ?Review of Systems  ?HENT:  Positive for congestion and sore throat. Negative for ear discharge, ear pain, sinus pressure and sinus pain.   ?Eyes:  Negative for discharge and redness.  ?Respiratory: Negative.    ?Cardiovascular: Negative.   ?Gastrointestinal: Negative.   ?Musculoskeletal:  Positive for arthralgias and myalgias.  ?Neurological:  Positive for headaches.  ? ? ?Physical Exam ?Triage Vital Signs ?ED Triage Vitals  ?Enc Vitals  Group  ?   BP 06/25/21 1834 121/75  ?   Pulse Rate 06/25/21 1834 (!) 112  ?   Resp 06/25/21 1834 20  ?   Temp 06/25/21 1834 100.2 ?F (37.9 ?C)  ?   Temp Source 06/25/21 1834 Oral  ?   SpO2 06/25/21 1834 99 %  ?   Weight --   ?   Height --   ?   Head Circumference --   ?   Peak Flow --   ?   Pain Score 06/25/21 1830 6  ?   Pain Loc --   ?  Pain Edu? --   ?   Excl. in [place]? --   ? ?No data found. ? ?Updated Vital Signs ?BP 121/75 (BP Location: Right Arm)   Pulse (!) 112   Temp 100.2 ?F (37.9 ?C) (Oral)   Resp 20   LMP 06/12/2021   SpO2 99%  ? ?Visual Acuity ?Right Eye Distance:   ?Left Eye Distance:   ?Bilateral Distance:   ? ?Right Eye Near:   ?Left Eye Near:    ?Bilateral Near:    ? ?Physical Exam ?Vitals and nursing note reviewed.  ?Constitutional:   ?   Appearance: She is ill-appearing.  ?HENT:  ?   Right Ear: Tympanic membrane normal.  ?   Left Ear: Tympanic membrane normal.  ?   Mouth/Throat:  ?   Pharynx: Posterior oropharyngeal erythema present.  ?Cardiovascular:  ?   Rate and Rhythm: Normal rate and regular rhythm.  ?   Pulses: Normal pulses.  ?   Heart sounds: Normal heart sounds.  ?Pulmonary:  ?   Effort: Pulmonary effort is normal.  ?   Breath sounds: Normal breath sounds.  ?Abdominal:  ?   General: Bowel sounds are normal.  ?   Palpations: Abdomen is soft.  ?Musculoskeletal:  ?   Cervical back: Normal range of motion and neck supple.  ?Neurological:  ?   Mental Status: She is alert.  ? ? ? ?UC Treatments / Results  ?Labs ?(all labs ordered are listed, but only abnormal results are displayed) ?Labs Reviewed  ?SARS CORONAVIRUS 2 (TAT 6-24 HRS)  ?POCT INFECTIOUS MONO SCREEN, ED / UC  ? ? ?EKG ? ? ?Radiology ?No results found. ? ?Procedures ?Procedures (including critical care time) ? ?Medications Ordered in UC ?Medications - No data to display ? ?Initial Impression / Assessment and Plan / UC Course  ?I have reviewed the triage vital signs and the nursing notes. ? ?Pertinent labs & imaging results that  were available during my care of the patient were reviewed by me and considered in my medical decision making (see chart for details). ? ?  ? ?1.  Acute viral syndrome: ?Monoscreen is negative ?COVID-19 PCR test has b

## 2021-06-28 ENCOUNTER — Encounter (HOSPITAL_COMMUNITY): Payer: Self-pay | Admitting: *Deleted

## 2021-06-28 ENCOUNTER — Other Ambulatory Visit: Payer: Self-pay

## 2021-06-28 ENCOUNTER — Ambulatory Visit (HOSPITAL_COMMUNITY)
Admission: EM | Admit: 2021-06-28 | Discharge: 2021-06-28 | Disposition: A | Discharge status: Home / Self Care | Payer: 59 | Attending: Internal Medicine | Admitting: Internal Medicine

## 2021-06-28 DIAGNOSIS — H66002 Acute suppurative otitis media without spontaneous rupture of ear drum, left ear: Secondary | ICD-10-CM | POA: Diagnosis not present

## 2021-06-28 MED ORDER — CEFDINIR 300 MG PO CAPS: 300.0000 mg | ORAL_CAPSULE | Freq: Two times a day (BID) | ORAL | 0 refills | Status: DC

## 2021-06-28 MED ORDER — FLUCONAZOLE 150 MG PO TABS: 150.0000 mg | ORAL_TABLET | Freq: Every day | ORAL | 0 refills | Status: DC

## 2021-06-28 NOTE — Discharge Instructions (Addendum)
I am prescribing you an antibiotic for your ear infection.  I would also like you to add Flonase and cetirizine to your daily regimen to help with the fluid behind your ears.  As we discussed, make sure you complete antibiotic.  If symptoms continue, I have given you the number to ENT to schedule follow-up. ?

## 2021-06-28 NOTE — ED Triage Notes (Signed)
T reports ear pain and congestion started yesterday. ?

## 2021-06-28 NOTE — ED Provider Notes (Signed)
?[facility] ? ? ? ?CSN: [id number] ?Arrival date & time: 06/28/21  1601 ? ? ?  ? ?History   ?Chief Complaint ?Chief Complaint  ?Patient presents with  ? Ear Fullness  ?  Left ear pain and fullness. - Entered by patient  ? Otalgia  ?  LT  ? ? ?HPI ?Megan Lara is a 41 y.o. female presents to urgent care today with complaints of left ear pain.  Patient was last seen in urgent care on 4/24 with congestion.,  She tested negative for COVID and mono.  She was prescribed Augmentin for acute sinusitis on 4/12 telehealth visit.  Patient states she was beginning to feel somewhat better with no fever in 3 days however developed left ear pain and muffled hearing yesterday.  Again patient denies any recent fever chills, cough, sore throat, chest pain, shortness of breath.  She does endorse continued nasal congestion.  She completed course of Augmentin with some relief and continues taking OTC decongestant and Tylenol. ? ? ? ?Past Medical History:  ?Diagnosis Date  ? Adjustment disorder with mixed anxiety and depressed mood 12/17/2016  ? Anemia   ? Anemia affecting pregnancy, antepartum 12/17/2016  ? Asthma   ? Chorioamnionitis, delivered, current hospitalization 12/17/2016  ? Depression   ? Hx of cellulitis of skin with lymphangitis   ? from last CS  ? Hypothyroidism 12/17/2016  ? Thyroid dysfunction in pregnancy in third trimester   ? ? ?Patient Active Problem List  ? Diagnosis Date Noted  ? Complicated UTI (urinary tract infection) 07/01/2019  ? Fever 06/26/2019  ? Leukocytosis 06/26/2019  ? Sepsis (HCC) 06/26/2019  ? Meningitis 03/23/2019  ? Hematoma of rectus sheath 05/28/2018  ? Status post repeat low transverse cesarean section and BTL 05/25/2018  ? Postpartum care following cesarean delivery (3/23) 05/25/2018  ? Carpal tunnel syndrome, left upper limb 01/13/2017  ? Carpal tunnel syndrome, right upper limb 01/13/2017  ? Hypothyroidism 12/17/2016  ? Adjustment disorder with mixed anxiety and depressed  mood 12/17/2016  ? ? ?Past Surgical History:  ?Procedure Laterality Date  ? CESAREAN SECTION N/A 12/16/2016  ? Procedure: CESAREAN SECTION;  Surgeon: Shea Evans, MD;  Location: [facility];  Service: Obstetrics;  Laterality: N/A;  ? CESAREAN SECTION WITH BILATERAL TUBAL LIGATION Bilateral 05/25/2018  ? Procedure: Repeat CESAREAN SECTION WITH BILATERAL TUBAL LIGATION;  Surgeon: Shea Evans, MD;  Location: [facility];  Service: Obstetrics;  Laterality: Bilateral;  EDD: 05/22/18  ? EXTRACORPOREAL SHOCK WAVE LITHOTRIPSY Right 06/24/2019  ? Procedure: EXTRACORPOREAL SHOCK WAVE LITHOTRIPSY (ESWL);  Surgeon: Ihor Gully, MD;  Location: [facility];  Service: Urology;  Laterality: Right;  75 MINS  ? GANGLION CYST EXCISION Left 4/15  ? 3 rd toe  ? HERNIA REPAIR Right 1991  ? IR ANGIOGRAM PELVIS SELECTIVE OR SUPRASELECTIVE  05/28/2018  ? IR ANGIOGRAM SELECTIVE EACH ADDITIONAL VESSEL  05/28/2018  ? IR ANGIOGRAM SELECTIVE EACH ADDITIONAL VESSEL  05/28/2018  ? IR ANGIOGRAM SELECTIVE EACH ADDITIONAL VESSEL  05/28/2018  ? IR EMBO ART  VEN HEMORR LYMPH EXTRAV  INC GUIDE ROADMAPPING  05/28/2018  ? IR US GUIDE VASC ACCESS RIGHT  05/28/2018  ? TEE WITHOUT CARDIOVERSION N/A 06/30/2019  ? Procedure: TRANSESOPHAGEAL ECHOCARDIOGRAM (TEE);  Surgeon: Chrystie Nose, MD;  Location: [facility] ENDOSCOPY;  Service: Cardiovascular;  Laterality: N/A;  ? TONSILLECTOMY  1997  ? ? ?OB History   ? ? Gravida  ?2  ? Para  ?2  ? Term  ?2  ?  Preterm  ?   ? AB  ?   ? Living  ?1  ?  ? ? SAB  ?   ? IAB  ?   ? Ectopic  ?   ? Multiple  ?0  ? Live Births  ?1  ?   ?  ?  ? ? ? ?Home Medications   ? ?Prior to Admission medications   ?Medication Sig Start Date End Date Taking? Authorizing Provider  ?cefdinir (OMNICEF) 300 MG capsule Take 1 capsule (300 mg total) by mouth 2 (two) times daily. 06/28/21  Yes Rolla Etienne, NP  ?fluconazole (DIFLUCAN) 150 MG tablet Take 1 tablet (150 mg total) by mouth daily. 06/28/21  Yes Rolla Etienne, NP  ?albuterol  (PROVENTIL HFA;VENTOLIN HFA) 108 (90 BASE) MCG/ACT inhaler Inhale 2 puffs into the lungs every 6 (six) hours as needed for wheezing.    [provider]  ?busPIRone (BUSPAR) 5 MG tablet Take 5 mg by mouth 2 (two) times daily.    Emergency, Nurse, RN  ?cetirizine (ZYRTEC) 10 MG tablet Take 10 mg by mouth at bedtime.    [provider]  ?HYDROcodone-acetaminophen (NORCO/VICODIN) 5-325 MG tablet Take 1 tablet by mouth every 6 (six) hours as needed for moderate pain or severe pain. ?Patient not taking: Reported on 06/25/2021 06/06/21   Mardella Layman, MD  ?levothyroxine (SYNTHROID, LEVOTHROID) 75 MCG tablet Take 75 mcg by mouth every other day.     [provider]  ?ondansetron (ZOFRAN-ODT) 4 MG disintegrating tablet Take 1 tablet (4 mg total) by mouth every 8 (eight) hours as needed for nausea or vomiting. ?Patient not taking: Reported on 06/25/2021 06/06/21   Mardella Layman, MD  ?rizatriptan (MAXALT-MLT) 10 MG disintegrating tablet Take 1 tablet (10 mg total) by mouth as needed for migraine. May repeat in 2 hours if needed 07/05/19   Lomax, Amy, NP  ?sertraline (ZOLOFT) 100 MG tablet Take 100 mg by mouth at bedtime.    [provider]  ? ? ?Family History ?Family History  ?Adopted: Yes  ?Problem Relation Age of Onset  ? Uterine cancer Maternal Grandmother   ? ? ?Social History ?Social History  ? ?Tobacco Use  ? Smoking status: Former  ?  Packs/day: 0.50  ?  Years: 7.00  ?  Pack years: 3.50  ?  Types: Cigarettes  ?  Quit date: 03/04/2013  ?  Years since quitting: 8.3  ? Smokeless tobacco: Never  ? Tobacco comments:  ?  does some vape  ?Vaping Use  ? Vaping Use: Never used  ?Substance Use Topics  ? Alcohol use: Not Currently  ?  Alcohol/week: 0.0 standard drinks  ?  Comment: 9 a week  ? Drug use: No  ? ? ? ?Allergies   ?Patient has no known allergies. ? ? ?Review of Systems ?As stated in HPI otherwise negative ? ? ?Physical Exam ?Triage Vital Signs ?ED Triage Vitals  ?Enc Vitals Group  ?   BP  06/28/21 1711 119/82  ?   Pulse Rate 06/28/21 1711 77  ?   Resp 06/28/21 1711 18  ?   Temp 06/28/21 1711 98.4 ?F (36.9 ?C)  ?   Temp src --   ?   SpO2 06/28/21 1711 99 %  ?   Weight --   ?   Height --   ?   Head Circumference --   ?   Peak Flow --   ?   Pain Score 06/28/21 1709 7  ?  Pain Loc --   ?   Pain Edu? --   ?   Excl. in GC? --   ? ?No data found. ? ?Updated Vital Signs ?BP 119/82   Pulse 77   Temp 98.4 ?F (36.9 ?C)   Resp 18   LMP 06/12/2021   SpO2 99%  ? ?Visual Acuity ?Right Eye Distance:   ?Left Eye Distance:   ?Bilateral Distance:   ? ?Right Eye Near:   ?Left Eye Near:    ?Bilateral Near:    ? ?Physical Exam ?Constitutional:   ?   General: She is not in acute distress. ?   Appearance: Normal appearance. She is normal weight. She is not ill-appearing or toxic-appearing.  ?HENT:  ?   Head: Normocephalic and atraumatic.  ?   Right Ear: Ear canal and external ear normal.  ?   Left Ear: Ear canal and external ear normal.  ?   Ears:  ?   Comments: Right TM with air-fluid levels.  Left TM with erythema and purulent effusion.  Textured appearance to TM without any perforation.  No drainage ?Cardiovascular:  ?   Rate and Rhythm: Normal rate and regular rhythm.  ?Pulmonary:  ?   Effort: Pulmonary effort is normal.  ?   Breath sounds: Normal breath sounds. No wheezing, rhonchi or rales.  ?Abdominal:  ?   General: Bowel sounds are normal.  ?   Palpations: Abdomen is soft.  ?Skin: ?   General: Skin is warm and dry.  ?Neurological:  ?   General: No focal deficit present.  ?   Mental Status: She is alert and oriented to person, place, and time.  ?Psychiatric:     ?   Mood and Affect: Mood normal.     ?   Behavior: Behavior normal.  ? ? ? ?UC Treatments / Results  ?Labs ?(all labs ordered are listed, but only abnormal results are displayed) ?Labs Reviewed - No data to display ? ?EKG ? ? ?Radiology ?No results found. ? ?Procedures ?Procedures (including critical care time) ? ?Medications Ordered in UC ?Medications  - No data to display ? ?Initial Impression / Assessment and Plan / UC Course  ?I have reviewed the triage vital signs and the nursing notes. ? ?Pertinent labs & imaging results that were available during my c

## 2021-09-01 IMAGING — CT CT RENAL STONE PROTOCOL
2 of 4 series · 17 of 46 positions shown, 19 images · non-contrast
Comparison: 05/28/2018

CLINICAL DATA: Right-sided abdominal pain, nausea for 4 days

EXAM:
CT ABDOMEN AND PELVIS WITHOUT CONTRAST
TECHNIQUE: Multidetector CT imaging of the abdomen and pelvis was performed
following the standard protocol without IV contrast.

[Series 3: renal stone 5.0 · axial · 0.79mm/px · z∈[+606,+1016]mm · 14 of 90 slices shown, 16 images]
[im 4/90  soft-tissue]
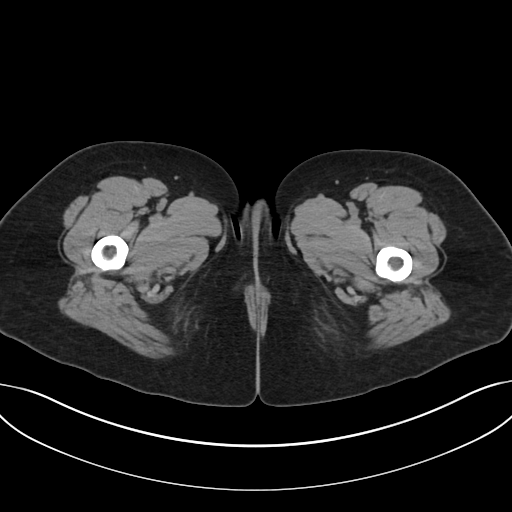
[im 4/90  bone]
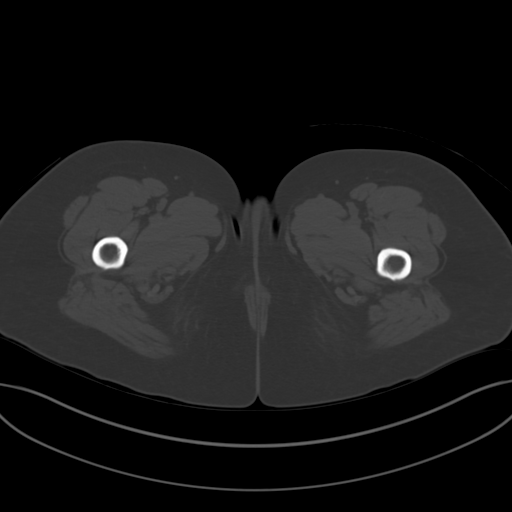
[im 12/90  soft-tissue]
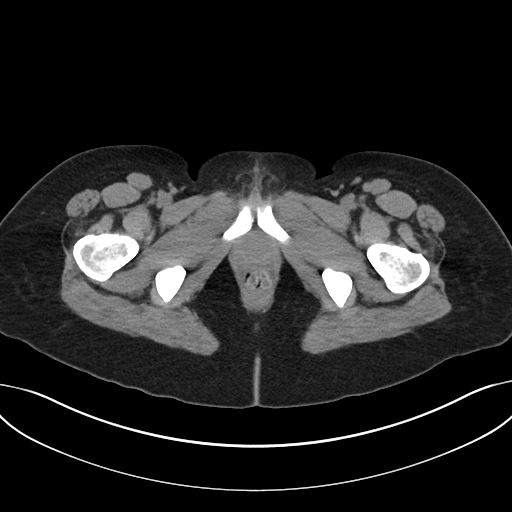
[im 19/90  soft-tissue]
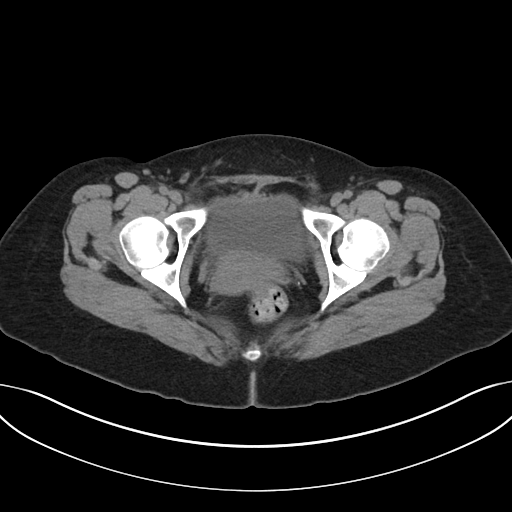
[im 23/90  soft-tissue]
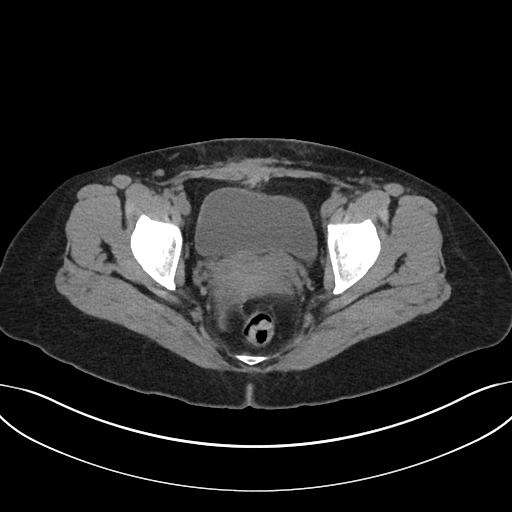
[im 30/90  soft-tissue]
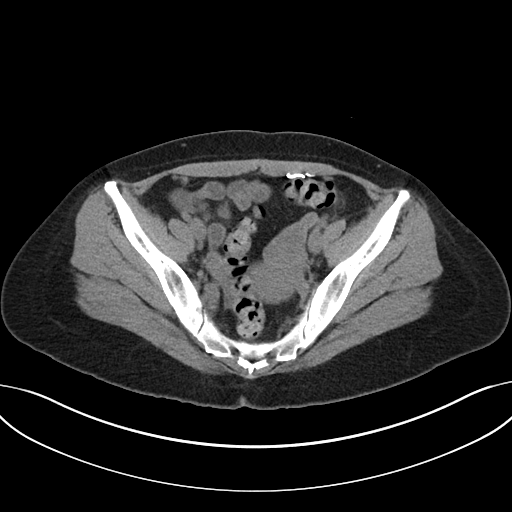
[im 38/90  soft-tissue]
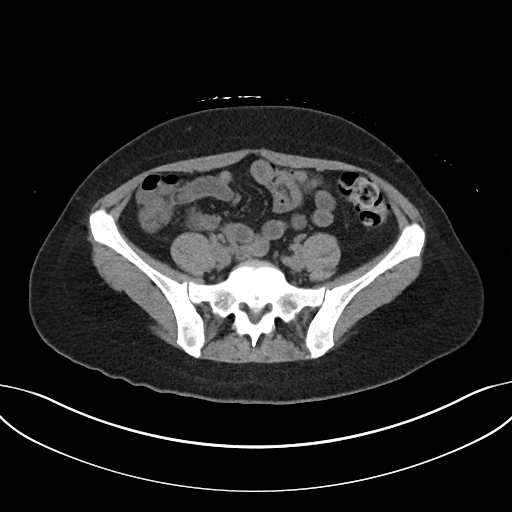
[im 41/90  soft-tissue]
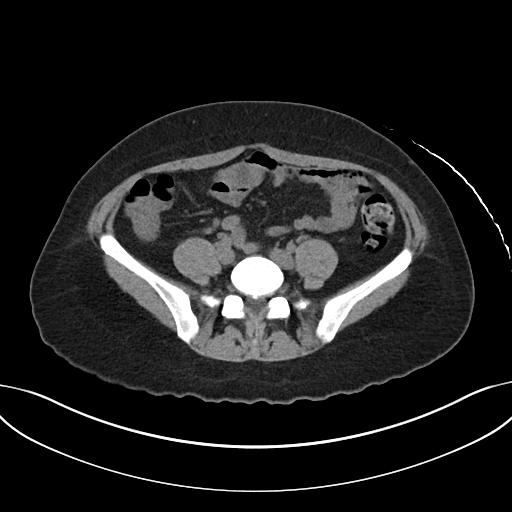
[im 49/90  soft-tissue]
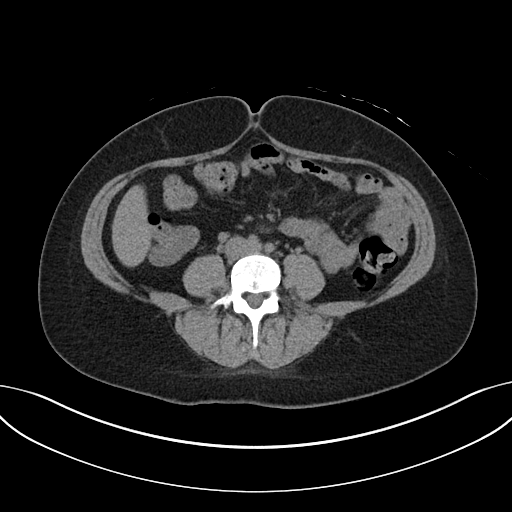
[im 52/90  soft-tissue]
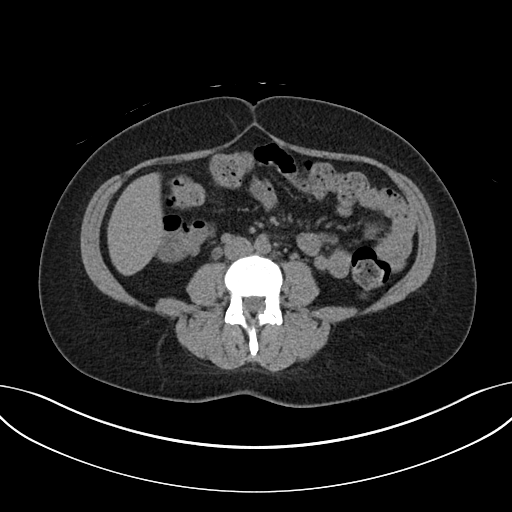
[im 52/90  bone]
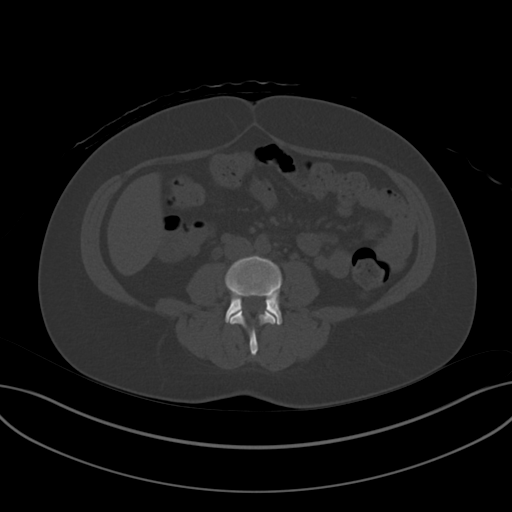
[im 60/90  soft-tissue]
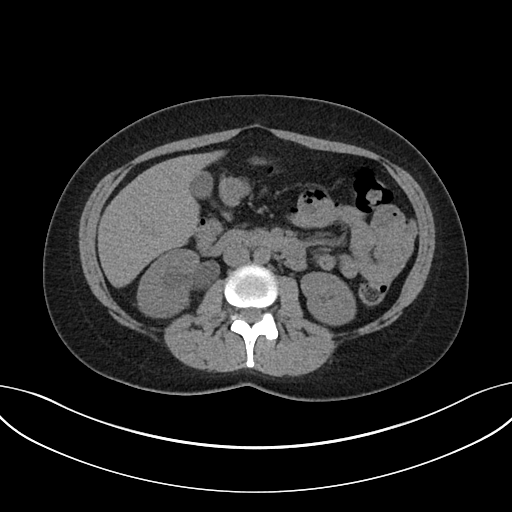
[im 67/90  soft-tissue]
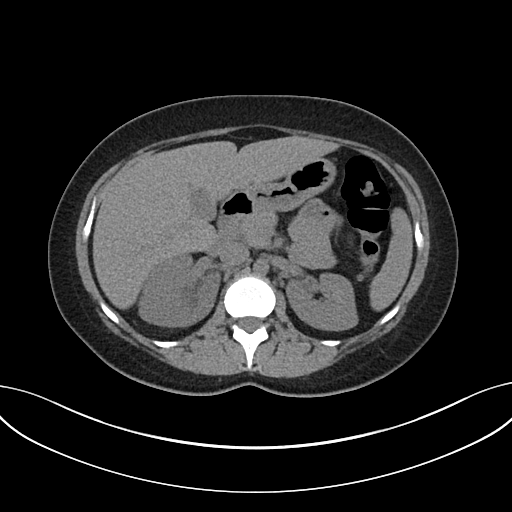
[im 71/90  soft-tissue]
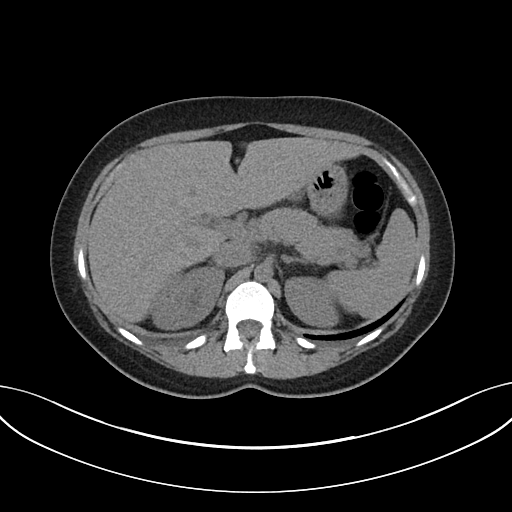
[im 78/90  soft-tissue]
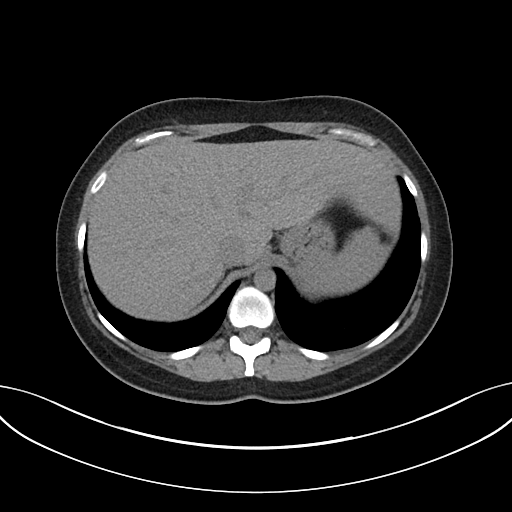
[im 86/90  soft-tissue]
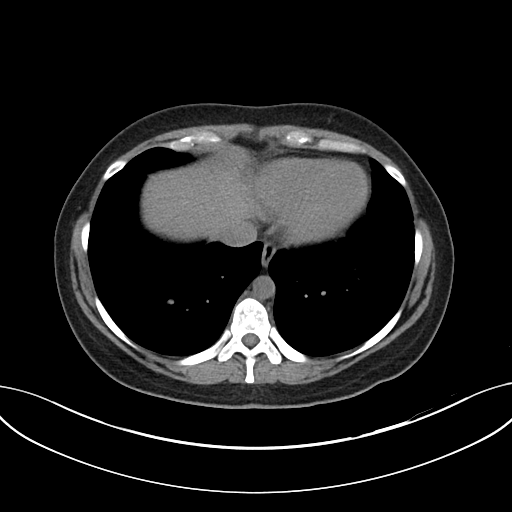

[Series 6: coronal · coronal · 0.83mm/px · 3 of 95 slices shown]
[im 32/95  soft-tissue]
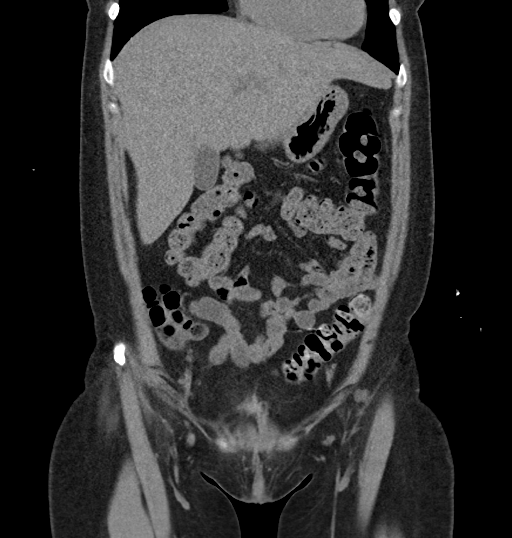
[im 42/95  soft-tissue]
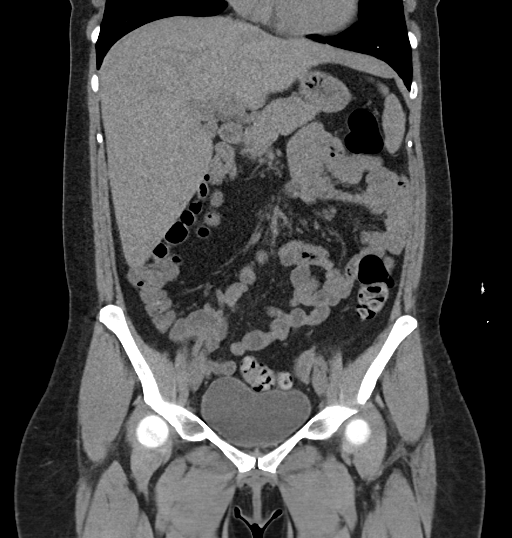
[im 53/95  soft-tissue]
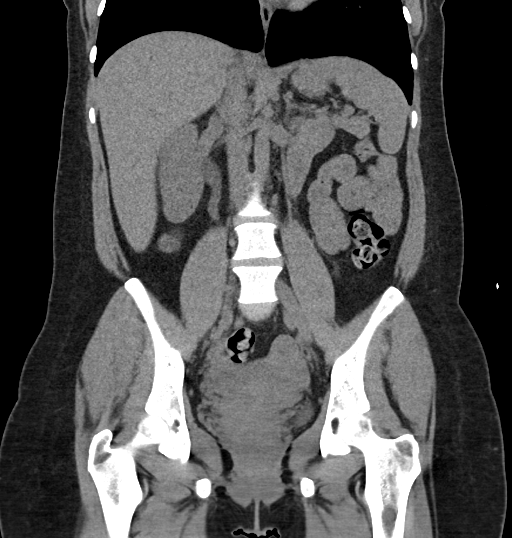

[17 of 46 positions shown; findings below may reference images not displayed]

FINDINGS: Lower chest: No acute pleural or parenchymal lung disease.

Hepatobiliary: No focal liver abnormality is seen. No gallstones,
gallbladder wall thickening, or biliary dilatation.

Pancreas: Unremarkable. No pancreatic ductal dilatation or
surrounding inflammatory changes.

Spleen: Normal in size without focal abnormality.

Adrenals/Urinary Tract: Moderate right-sided obstructive uropathy is
identified, related to a 5 mm obstructing proximal right ureteral
calculus, reference image 40.

The left kidney is unremarkable. Bladder is normal. Adrenal glands
are unremarkable.

Stomach/Bowel: No bowel obstruction or ileus. Normal retrocecal
appendix. No bowel wall thickening or inflammatory change.

Vascular/Lymphatic: No significant vascular findings are present. No
enlarged abdominal or pelvic lymph nodes.

Reproductive: Uterus and bilateral adnexa are unremarkable.

Other: Trace pelvic free fluid is likely physiologic. No free gas.
Embolic coils are seen within the left rectus sheath.

Musculoskeletal: No acute or destructive bony lesions. Reconstructed
images demonstrate no additional findings.
IMPRESSION: 1. Right-sided obstructive uropathy due to a 5 mm proximal right
ureteral calculus.

## 2021-09-15 ENCOUNTER — Telehealth: Payer: 59 | Admitting: Physician Assistant

## 2021-09-15 DIAGNOSIS — R300 Dysuria: Secondary | ICD-10-CM | POA: Diagnosis not present

## 2021-09-15 MED ORDER — CEPHALEXIN 500 MG PO CAPS: 500.0000 mg | ORAL_CAPSULE | Freq: Two times a day (BID) | ORAL | 0 refills | Status: DC

## 2021-09-15 NOTE — Progress Notes (Signed)
E-Visit for Urinary Problems  We are sorry that you are not feeling well.  Here is how we plan to help!  Based on what you shared with me it looks like you most likely have a simple urinary tract infection.  A UTI (Urinary Tract Infection) is a bacterial infection of the bladder.  Most cases of urinary tract infections are simple to treat but a key part of your care is to encourage you to drink plenty of fluids and watch your symptoms carefully.  I have prescribed Keflex 500 mg twice a day for 7 days.  Your symptoms should gradually improve. Call us if the burning in your urine worsens, you develop worsening fever, back pain or pelvic pain or if your symptoms do not resolve after completing the antibiotic.  Urinary tract infections can be prevented by drinking plenty of water to keep your body hydrated.  Also be sure when you wipe, wipe from front to back and don't hold it in!  If possible, empty your bladder every 4 hours.  HOME CARE Drink plenty of fluids Compete the full course of the antibiotics even if the symptoms resolve Remember, when you need to go.go. Holding in your urine can increase the likelihood of getting a UTI! GET HELP RIGHT AWAY IF: You cannot urinate You get a high fever Worsening back pain occurs You see blood in your urine You feel sick to your stomach or throw up You feel like you are going to pass out  MAKE SURE YOU  Understand these instructions. Will watch your condition. Will get help right away if you are not doing well or get worse.   Thank you for choosing an e-visit.  Your e-visit answers were reviewed by a board certified advanced clinical practitioner to complete your personal care plan. Depending upon the condition, your plan could have included both over the counter or prescription medications.  Please review your pharmacy choice. Make sure the pharmacy is open so you can pick up prescription now. If there is a problem, you may contact your  provider through [employer] and have the prescription routed to another pharmacy.  Your safety is important to [place]. If you have drug allergies check your prescription carefully.   For the next 24 hours you can use MyChart to ask questions about today's visit, request a non-urgent call back, or ask for a work or school excuse. You will get an email in the next two days asking about your experience. I hope that your e-visit has been valuable and will speed your recovery.  Time spent with patient today was 5-10 minutes which consisted of chart review, discussing diagnosis, work up, treatment answering questions and documentation.  Jarold Motto PA-C

## 2021-10-12 ENCOUNTER — Encounter (HOSPITAL_BASED_OUTPATIENT_CLINIC_OR_DEPARTMENT_OTHER): Payer: Self-pay

## 2021-10-12 ENCOUNTER — Other Ambulatory Visit: Payer: Self-pay

## 2021-10-12 ENCOUNTER — Emergency Department (HOSPITAL_BASED_OUTPATIENT_CLINIC_OR_DEPARTMENT_OTHER): Payer: 59 | Admitting: Radiology

## 2021-10-12 DIAGNOSIS — N9489 Other specified conditions associated with female genital organs and menstrual cycle: Secondary | ICD-10-CM | POA: Diagnosis not present

## 2021-10-12 DIAGNOSIS — R1011 Right upper quadrant pain: Secondary | ICD-10-CM | POA: Insufficient documentation

## 2021-10-12 DIAGNOSIS — R110 Nausea: Secondary | ICD-10-CM | POA: Insufficient documentation

## 2021-10-12 LAB — CBC
HCT: 38.8 % (ref 36.0–46.0)
Hemoglobin: 12.7 g/dL (ref 12.0–15.0)
MCH: 30.4 pg (ref 26.0–34.0)
MCHC: 32.7 g/dL (ref 30.0–36.0)
MCV: 92.8 fL (ref 80.0–100.0)
Platelets: 358 10*3/uL (ref 150–400)
RBC: 4.18 MIL/uL (ref 3.87–5.11)
RDW: 15.2 % (ref 11.5–15.5)
WBC: 7 10*3/uL (ref 4.0–10.5)
nRBC: 0 % (ref 0.0–0.2)

## 2021-10-12 LAB — COMPREHENSIVE METABOLIC PANEL
ALT: 15 U/L (ref 0–44)
AST: 20 U/L (ref 15–41)
Albumin: 4.7 g/dL (ref 3.5–5.0)
Alkaline Phosphatase: 38 U/L (ref 38–126)
Anion gap: 12 (ref 5–15)
BUN: 15 mg/dL (ref 6–20)
CO2: 23 mmol/L (ref 22–32)
Calcium: 9.6 mg/dL (ref 8.9–10.3)
Chloride: 103 mmol/L (ref 98–111)
Creatinine, Ser: 0.71 mg/dL (ref 0.44–1.00)
GFR, Estimated: >60 mL/min (ref >60)
Glucose, Bld: 88 mg/dL (ref 70–99)
Potassium: 3.9 mmol/L (ref 3.5–5.1)
Sodium: 138 mmol/L (ref 135–145)
Total Bilirubin: 1 mg/dL (ref 0.3–1.2)
Total Protein: 7.6 g/dL (ref 6.5–8.1)

## 2021-10-12 LAB — URINALYSIS, MICROSCOPIC (REFLEX)

## 2021-10-12 LAB — HCG, SERUM, QUALITATIVE: Preg, Serum: NEGATIVE

## 2021-10-12 LAB — URINALYSIS, ROUTINE W REFLEX MICROSCOPIC
Bilirubin Urine: NEGATIVE
Glucose, UA: NEGATIVE mg/dL
Ketones, ur: 40 mg/dL — AB
Leukocytes,Ua: NEGATIVE
Nitrite: NEGATIVE
Protein, ur: NEGATIVE mg/dL
Specific Gravity, Urine: 1.015 (ref 1.005–1.030)
pH: 6.5 (ref 5.0–8.0)

## 2021-10-12 LAB — LIPASE, BLOOD: Lipase: <10 U/L — ABNORMAL LOW (ref 11–51)

## 2021-10-12 LAB — TROPONIN I (HIGH SENSITIVITY): Troponin I (High Sensitivity): <2 ng/L (ref <18)

## 2021-10-12 NOTE — ED Triage Notes (Signed)
Patient here POV from Home.  Endorses RUQ ABD Pain for approximately 3-4 Days. Today Pain began to radiate to back and into Mid Chest.   Mild Nausea. No Emesis or Diarrhea. No Known Fevers.   NAD Noted during Triage. A&Ox4. Gcs 15. Ambulatory.

## 2021-10-13 ENCOUNTER — Emergency Department (HOSPITAL_BASED_OUTPATIENT_CLINIC_OR_DEPARTMENT_OTHER)
Admission: EM | Admit: 2021-10-13 | Discharge: 2021-10-13 | Disposition: A | Discharge status: Home / Self Care | Payer: 59 | Attending: Emergency Medicine | Admitting: Emergency Medicine

## 2021-10-13 ENCOUNTER — Emergency Department (HOSPITAL_BASED_OUTPATIENT_CLINIC_OR_DEPARTMENT_OTHER): Payer: 59

## 2021-10-13 DIAGNOSIS — R1011 Right upper quadrant pain: Secondary | ICD-10-CM

## 2021-10-13 LAB — TROPONIN I (HIGH SENSITIVITY): Troponin I (High Sensitivity): 2 ng/L (ref <18)

## 2021-10-13 MED ORDER — IOHEXOL 300 MG/ML  SOLN
100.0000 mL | Freq: Once | INTRAMUSCULAR | Status: AC | PRN
  Administered 2021-10-13: 80 mL via INTRAVENOUS

## 2021-10-13 MED ORDER — SODIUM CHLORIDE 0.9 % IV BOLUS
1000.0000 mL | Freq: Once | INTRAVENOUS | Status: AC
  Administered 2021-10-13: 1000 mL via INTRAVENOUS

## 2021-10-13 MED ORDER — FENTANYL CITRATE PF 50 MCG/ML IJ SOSY
50.0000 ug | PREFILLED_SYRINGE | Freq: Once | INTRAMUSCULAR | Status: AC
  Administered 2021-10-13: 50 ug via INTRAVENOUS
  Filled 2021-10-13: qty 1

## 2021-10-13 MED ORDER — ONDANSETRON HCL 4 MG/2ML IJ SOLN
4.0000 mg | Freq: Once | INTRAMUSCULAR | Status: AC
  Administered 2021-10-13: 4 mg via INTRAVENOUS
  Filled 2021-10-13: qty 2

## 2021-10-13 NOTE — ED Provider Notes (Signed)
[facility] EMERGENCY DEPT  Provider Note  CSN: [id number] Arrival date & time: 10/12/21 2038  History Chief Complaint  Patient presents with   Abdominal Pain    Megan Lara is a 41 y.o. female reports RUQ abdominal pain intermittently for a few days but more persistent and more severe for the last several hours. Now also radiating into R upper back/shoulder and into epigastric region. No vomiting. Some nausea. No fevers. No recent diarrhea or constipation. No prior history of same. No noticeable difference after eating.    Home Medications Prior to Admission medications   Medication Sig Start Date End Date Taking? Authorizing Provider  albuterol (PROVENTIL HFA;VENTOLIN HFA) 108 (90 BASE) MCG/ACT inhaler Inhale 2 puffs into the lungs every 6 (six) hours as needed for wheezing.    [provider]  busPIRone (BUSPAR) 5 MG tablet Take 5 mg by mouth 2 (two) times daily.    Emergency, Nurse, RN  cephALEXin (KEFLEX) 500 MG capsule Take 1 capsule (500 mg total) by mouth 2 (two) times daily. 09/15/21   Jarold Motto, PA  cetirizine (ZYRTEC) 10 MG tablet Take 10 mg by mouth at bedtime.    [provider]  levothyroxine (SYNTHROID, LEVOTHROID) 75 MCG tablet Take 75 mcg by mouth every other day.     [provider]  rizatriptan (MAXALT-MLT) 10 MG disintegrating tablet Take 1 tablet (10 mg total) by mouth as needed for migraine. May repeat in 2 hours if needed 07/05/19   Lomax, Amy, NP  sertraline (ZOLOFT) 100 MG tablet Take 100 mg by mouth at bedtime.    [provider]     Allergies    Patient has no known allergies.   Review of Systems   Review of Systems Please see HPI for pertinent positives and negatives  Physical Exam BP 125/69   Pulse 65   Temp 98.8 F (37.1 C) (Oral)   Resp 14   Ht 5\' 7"  (1.702 m)   Wt 68 kg   SpO2 100%   BMI 23.48 kg/m   Physical Exam Vitals and nursing note reviewed.  Constitutional:       Appearance: Normal appearance.  HENT:     Head: Normocephalic and atraumatic.     Nose: Nose normal.     Mouth/Throat:     Mouth: Mucous membranes are moist.  Eyes:     Extraocular Movements: Extraocular movements intact.     Conjunctiva/sclera: Conjunctivae normal.  Cardiovascular:     Rate and Rhythm: Normal rate.  Pulmonary:     Effort: Pulmonary effort is normal.     Breath sounds: Normal breath sounds.  Abdominal:     General: Abdomen is flat.     Palpations: Abdomen is soft.     Tenderness: There is abdominal tenderness in the right upper quadrant. There is no guarding. Positive signs include Murphy's sign. Negative signs include McBurney's sign.  Musculoskeletal:        General: No swelling. Normal range of motion.     Cervical back: Neck supple.  Skin:    General: Skin is warm and dry.  Neurological:     General: No focal deficit present.     Mental Status: She is alert.  Psychiatric:        Mood and Affect: Mood normal.     ED Results / Procedures / Treatments   EKG EKG Interpretation  Date/Time:  Friday October 12 2021 20:54:12 EDT Ventricular Rate:  65 PR Interval:  122 QRS  Duration: 74 QT Interval:  388 QTC Calculation: 403 R Axis:   65 Text Interpretation: Normal sinus rhythm with sinus arrhythmia Normal ECG When compared with ECG of 30-Jun-2019 15:53, T wave inversion no longer evident in Anterior leads Confirmed by Susy Frizzle [phone]) on 10/13/2021 1:42:52 AM  Procedures Procedures  Medications Ordered in the ED Medications  fentaNYL (SUBLIMAZE) injection 50 mcg (50 mcg Intravenous Given 10/13/21 0154)  ondansetron (ZOFRAN) injection 4 mg (4 mg Intravenous Given 10/13/21 0154)  sodium chloride 0.9 % bolus 1,000 mL (0 mLs Intravenous Stopped 10/13/21 0307)  iohexol (OMNIPAQUE) 300 MG/ML solution 100 mL (80 mLs Intravenous Contrast Given 10/13/21 0202)    Initial Impression and Plan  Patient with RUQ pain, borderline positive Murphy's sign. Labs  done in triage show normal CBC, CMP, lipase and Trop. UA is neg for infection. Given lack of [place] at this hour, will send for CT to evaluation biliary colic. Pain/nausea meds for comfort.   ED Course   Clinical Course as of 10/13/21 0322  Sat Oct 13, 2021  0212 Repeat Trop remains normal. No concern for ACS.  [CS]  [id number] I personally viewed the images from radiology studies and agree with radiologist interpretation: CT is neg for acute process. Pain improved. No signs of acute surgical process. Could still be biliary in etiology, recommend she avoid fatty or spicy foods. Follow up with Gen Surg for re-eval and consideration for additional testing such as HIDA if her symptoms persist. RTED in the meantime for any worsening pain, uncontrolled vomiting or fevers.   [CS]    Clinical Course User Index [CS] Pollyann Savoy, MD     MDM Rules/Calculators/A&P Medical Decision Making Given presenting complaint, I considered that admission might be necessary. After review of results from ED lab and/or imaging studies, admission to the hospital is not indicated at this time.    Problems Addressed: RUQ abdominal pain: acute illness or injury  Amount and/or Complexity of Data Reviewed Labs: ordered. Decision-making details documented in ED Course. Radiology: ordered and independent interpretation performed. Decision-making details documented in ED Course.  Risk Prescription drug management. Parenteral controlled substances. Decision regarding hospitalization.    Final Clinical Impression(s) / ED Diagnoses Final diagnoses:  RUQ abdominal pain    Rx / DC Orders ED Discharge Orders     None        Pollyann Savoy, MD 10/13/21 [id number]

## 2021-11-05 ENCOUNTER — Telehealth: Payer: 59 | Admitting: Nurse Practitioner

## 2021-11-05 DIAGNOSIS — R21 Rash and other nonspecific skin eruption: Secondary | ICD-10-CM | POA: Diagnosis not present

## 2021-11-05 DIAGNOSIS — W57XXXD Bitten or stung by nonvenomous insect and other nonvenomous arthropods, subsequent encounter: Secondary | ICD-10-CM

## 2021-11-05 MED ORDER — HYDROXYZINE HCL 10 MG PO TABS: 10.0000 mg | ORAL_TABLET | Freq: Three times a day (TID) | ORAL | 0 refills | Status: DC | PRN

## 2021-11-05 MED ORDER — DOXYCYCLINE HYCLATE 100 MG PO TABS: 100.0000 mg | ORAL_TABLET | Freq: Once | ORAL | 0 refills | Status: AC

## 2021-11-05 NOTE — Progress Notes (Signed)
I have spent 5 minutes in review of e-visit questionnaire, review and updating patient chart, medical decision making and response to patient.  ° ° W , NP ° °  °

## 2021-11-05 NOTE — Progress Notes (Signed)
E-Visit for Tick Bite  Thank you for describing your tick bite, Here is how we plan to help! Based on the information that you shared with me it looks like you have A tick that bite that we will treat with a short course of doxycycline.  In most cases a tick bite is painless and does not itch.  Most tick bites in which the tick is quickly removed do not require prescriptions. Ticks can transmit several diseases if they are infected and remain attacked to your skin. Therefore the length that the tick was attached and any symptoms you have experienced after the bite are import to accurately develop your custom treatment plan. In most cases a single dose of doxycycline may prevent the development of a more serious condition.  Based on your information I have Provided a home care guide for tick bites and  instructions on when to call for help. and I have sent a single dose of doxycycline to the pharmacy you selected. Please make sure that you selected a pharmacy that is open now. I have also prescribed hydroxyzine for itching.   Which ticks  are associated with illness?  The Wood Tick (dog tick) is the size of a watermelon seed and can sometimes transmit [facility] spotted fever and [place] tick fever.   The Deer Tick (black-legged tick) is between the size of a poppy seed (pin head) and an apple seed, and can sometimes transmit Lyme disease.  A brown to black tick with a white splotch on its back is likely a female Amblyomma americanum ([place] tick). This tick has been associated with Southern Tick Associated illness ( STARI)  Lyme disease has become the most common tick-borne illness in the [place]. The risk of Lyme disease following a recognized deer tick bite is estimated to be 1%.  The majority of cases of Lyme disease start with a bull's eye rash at the site of the tick bite. The rash can occur days to weeks (typically 7-10 days) after a tick bite. Treatment with antibiotics is  indicated if this rash appears. Flu-like symptoms may accompany the rash, including: fever, chills, headaches, muscle aches, and fatigue. Removing ticks promptly may prevent tick borne disease.  What can be used to prevent Tick Bites?  Insect repellant with at leas 20% DEET. Wearing long pants with sock and shoes. Avoiding tall grass and heavily wooded areas. Checking your skin after being outdoors. Shower with a washcloth after outdoor exposures.  HOME CARE ADVICE FOR TICK BITE  Wood Tick Removal:  Use a pair of tweezers and grasp the wood tick close to the skin (on its head). Pull the wood tick straight upward without twisting or crushing it. Maintain a steady pressure until it releases its grip.   If tweezers aren't available, use fingers, a loop of thread around the jaws, or a needle between the jaws for traction.  Note: covering the tick with petroleum jelly, nail polish or rubbing alcohol doesn't work. Neither does touching the tick with a hot or cold object. Tiny Deer Tick Removal:   Needs to be scraped off with a knife blade or credit card edge. Place tick in a sealed container (e.g. glass jar, zip lock plastic bag), in case your doctor wants to see it. Tick's Head Removal:  If the wood tick's head breaks off in the skin, it must be removed. Clean the skin. Then use a sterile needle to uncover the head and lift it out or scrape  it off.  If a very small piece of the head remains, the skin will eventually slough it off. Antibiotic Ointment:  Wash the wound and your hands with soap and water after removal to prevent catching any tick disease.  Apply an over the counter antibiotic ointment (e.g. bacitracin) to the bite once. Expected Course: Tick bites normally don't itch or hurt. That's why they often go unnoticed. Call Your Doctor If:  You can't remove the tick or the tick's head Fever, a severe head ache, or rash occur in the next 2 weeks Bite begins to look infected Lyme's  disease is common in your area You have not had a tetanus in the last 10 years Your current symptoms become worse    MAKE SURE YOU  Understand these instructions. Will watch your condition. Will get help right away if you are not doing well or get worse.    Thank you for choosing an e-visit.  Your e-visit answers were reviewed by a board certified advanced clinical practitioner to complete your personal care plan. Depending upon the condition, your plan could have included both over the counter or prescription medications.  Please review your pharmacy choice. Make sure the pharmacy is open so you can pick up prescription now. If there is a problem, you may contact your provider through [employer] and have the prescription routed to another pharmacy.  Your safety is important to [place]. If you have drug allergies check your prescription carefully.   For the next 24 hours you can use MyChart to ask questions about today's visit, request a non-urgent call back, or ask for a work or school excuse. You will get an email in the next two days asking about your experience. I hope that your e-visit has been valuable and will speed your recovery.

## 2021-11-09 ENCOUNTER — Other Ambulatory Visit: Payer: Self-pay | Admitting: General Surgery

## 2021-11-09 DIAGNOSIS — R1011 Right upper quadrant pain: Secondary | ICD-10-CM

## 2021-11-14 ENCOUNTER — Ambulatory Visit
Admission: RE | Admit: 2021-11-14 | Discharge: 2021-11-14 | Disposition: A | Discharge status: Home / Self Care | Payer: 59 | Source: Ambulatory Visit | Attending: General Surgery | Admitting: General Surgery

## 2021-11-14 DIAGNOSIS — R1011 Right upper quadrant pain: Secondary | ICD-10-CM

## 2022-01-28 ENCOUNTER — Telehealth: Payer: 59 | Admitting: Physician Assistant

## 2022-01-28 DIAGNOSIS — J069 Acute upper respiratory infection, unspecified: Secondary | ICD-10-CM

## 2022-01-28 MED ORDER — FLUTICASONE PROPIONATE 50 MCG/ACT NA SUSP: 2.0000 | Freq: Every day | NASAL | 0 refills | Status: DC

## 2022-01-28 MED ORDER — PREDNISONE 20 MG PO TABS: 40.0000 mg | ORAL_TABLET | Freq: Every day | ORAL | 0 refills | Status: DC

## 2022-01-28 MED ORDER — BENZONATATE 100 MG PO CAPS: 100.0000 mg | ORAL_CAPSULE | Freq: Three times a day (TID) | ORAL | 0 refills | Status: DC | PRN

## 2022-01-28 NOTE — Progress Notes (Signed)
E-Visit for Upper Respiratory Infection   We are sorry you are not feeling well.  Here is how we plan to help!  Based on what you have shared with me, it looks like you may have a viral upper respiratory infection.  Upper respiratory infections are caused by a large number of viruses; however, rhinovirus is the most common cause.   Symptoms vary from person to person, with common symptoms including sore throat, cough, fatigue or lack of energy and feeling of general discomfort.  A low-grade fever of up to 100.4 may present, but is often uncommon.  Symptoms vary however, and are closely related to a person's age or underlying illnesses.  The most common symptoms associated with an upper respiratory infection are nasal discharge or congestion, cough, sneezing, headache and pressure in the ears and face.  These symptoms usually persist for about 3 to 10 days, but can last up to 2 weeks.  It is important to know that upper respiratory infections do not cause serious illness or complications in most cases.    Upper respiratory infections can be transmitted from person to person, with the most common method of transmission being a person's hands.  The virus is able to live on the skin and can infect other persons for up to 2 hours after direct contact.  Also, these can be transmitted when someone coughs or sneezes; thus, it is important to cover the mouth to reduce this risk.  To keep the spread of the illness at bay, good hand hygiene is very important.  This is an infection that is most likely caused by a virus. There are no specific treatments other than to help you with the symptoms until the infection runs its course.  We are sorry you are not feeling well.  Here is how we plan to help!   For nasal congestion, you may use an oral decongestants such as Mucinex D or if you have glaucoma or high blood pressure use plain Mucinex.  Saline nasal spray or nasal drops can help and can safely be used as often as  needed for congestion.  For your congestion, I have prescribed Fluticasone nasal spray one spray in each nostril twice a day  If you do not have a history of heart disease, hypertension, diabetes or thyroid disease, prostate/bladder issues or glaucoma, you may also use Sudafed to treat nasal congestion.  It is highly recommended that you consult with a pharmacist or your primary care physician to ensure this medication is safe for you to take.     If you have a cough, you may use cough suppressants such as Delsym and Robitussin.  If you have glaucoma or high blood pressure, you can also use Coricidin HBP.   For cough I have prescribed for you A prescription cough medication called Tessalon Perles 100 mg. You may take 1-2 capsules every 8 hours as needed for cough  I have prescribed Prednisone 20mg  Take 2 tablets (40mg ) daily for 5 days for chest congestion and wheezing. Continue Albuterol inhaler as needed. If refill required, let know and we can supply this as well.   If you have a sore or scratchy throat, use a saltwater gargle-  to  teaspoon of salt dissolved in a 4-ounce to 8-ounce glass of warm water.  Gargle the solution for approximately 15-30 seconds and then spit.  It is important not to swallow the solution.  You can also use throat lozenges/cough drops and Chloraseptic spray to help  with throat pain or discomfort.  Warm or cold liquids can also be helpful in relieving throat pain.  For headache, pain or general discomfort, you can use Ibuprofen or Tylenol as directed.   Some authorities believe that zinc sprays or the use of Echinacea may shorten the course of your symptoms.   HOME CARE Only take medications as instructed by your medical team. Be sure to drink plenty of fluids. Water is fine as well as fruit juices, sodas and electrolyte beverages. You may want to stay away from caffeine or alcohol. If you are nauseated, try taking small sips of liquids. How do you know if you are  getting enough fluid? Your urine should be a pale yellow or almost colorless. Get rest. Taking a steamy shower or using a humidifier may help nasal congestion and ease sore throat pain. You can place a towel over your head and breathe in the steam from hot water coming from a faucet. Using a saline nasal spray works much the same way. Cough drops, hard candies and sore throat lozenges may ease your cough. Avoid close contacts especially the very young and the elderly Cover your mouth if you cough or sneeze Always remember to wash your hands.   GET HELP RIGHT AWAY IF: You develop worsening fever. If your symptoms do not improve within 10 days You develop yellow or green discharge from your nose over 3 days. You have coughing fits You develop a severe head ache or visual changes. You develop shortness of breath, difficulty breathing or start having chest pain Your symptoms persist after you have completed your treatment plan  MAKE SURE YOU  Understand these instructions. Will watch your condition. Will get help right away if you are not doing well or get worse.  Thank you for choosing an e-visit.  Your e-visit answers were reviewed by a board certified advanced clinical practitioner to complete your personal care plan. Depending upon the condition, your plan could have included both over the counter or prescription medications.  Please review your pharmacy choice. Make sure the pharmacy is open so you can pick up prescription now. If there is a problem, you may contact your provider through [employer] and have the prescription routed to another pharmacy.  Your safety is important to [place]. If you have drug allergies check your prescription carefully.   For the next 24 hours you can use MyChart to ask questions about today's visit, request a non-urgent call back, or ask for a work or school excuse. You will get an email in the next two days asking about your experience. I hope that  your e-visit has been valuable and will speed your recovery.   I have spent 5 minutes in review of e-visit questionnaire, review and updating patient chart, medical decision making and response to patient.   Margaretann Loveless, PA-C

## 2022-03-01 ENCOUNTER — Telehealth: Payer: 59 | Admitting: Physician Assistant

## 2022-03-01 DIAGNOSIS — J208 Acute bronchitis due to other specified organisms: Secondary | ICD-10-CM | POA: Diagnosis not present

## 2022-03-01 MED ORDER — BENZONATATE 100 MG PO CAPS: 100.0000 mg | ORAL_CAPSULE | Freq: Three times a day (TID) | ORAL | 0 refills | Status: DC | PRN

## 2022-03-01 MED ORDER — PREDNISONE 20 MG PO TABS: 40.0000 mg | ORAL_TABLET | Freq: Every day | ORAL | 0 refills | Status: DC

## 2022-03-01 NOTE — Progress Notes (Signed)
We are sorry that you are not feeling well.  Here is how we plan to help!  Based on your presentation I believe you most likely have A cough due to a virus.  This is called viral bronchitis and is best treated by rest, plenty of fluids and control of the cough.  You may use Ibuprofen or Tylenol as directed to help your symptoms.     In addition you may use A prescription cough medication called Tessalon Perles 100mg. You may take 1-2 capsules every 8 hours as needed for your cough. I have also sent in a short course of prednisone to take as directed.  From your responses in the eVisit questionnaire you describe inflammation in the upper respiratory tract which is causing a significant cough.  This is commonly called Bronchitis and has four common causes:   Allergies Viral Infections Acid Reflux Bacterial Infection Allergies, viruses and acid reflux are treated by controlling symptoms or eliminating the cause. An example might be a cough caused by taking certain blood pressure medications. You stop the cough by changing the medication. Another example might be a cough caused by acid reflux. Controlling the reflux helps control the cough.  USE OF BRONCHODILATOR ("RESCUE") INHALERS: There is a risk from using your bronchodilator too frequently.  The risk is that over-reliance on a medication which only relaxes the muscles surrounding the breathing tubes can reduce the effectiveness of medications prescribed to reduce swelling and congestion of the tubes themselves.  Although you feel brief relief from the bronchodilator inhaler, your asthma may actually be worsening with the tubes becoming more swollen and filled with mucus.  This can delay other crucial treatments, such as oral steroid medications. If you need to use a bronchodilator inhaler daily, several times per day, you should discuss this with your provider.  There are probably better treatments that could be used to keep your asthma under  control.     HOME CARE Only take medications as instructed by your medical team. Complete the entire course of an antibiotic. Drink plenty of fluids and get plenty of rest. Avoid close contacts especially the very young and the elderly Cover your mouth if you cough or cough into your sleeve. Always remember to wash your hands A steam or ultrasonic humidifier can help congestion.   GET HELP RIGHT AWAY IF: You develop worsening fever. You become short of breath You cough up blood. Your symptoms persist after you have completed your treatment plan MAKE SURE YOU  Understand these instructions. Will watch your condition. Will get help right away if you are not doing well or get worse.    Thank you for choosing an e-visit.  Your e-visit answers were reviewed by a board certified advanced clinical practitioner to complete your personal care plan. Depending upon the condition, your plan could have included both over the counter or prescription medications.  Please review your pharmacy choice. Make sure the pharmacy is open so you can pick up prescription now. If there is a problem, you may contact your provider through MyChart messaging and have the prescription routed to another pharmacy.  Your safety is important to us. If you have drug allergies check your prescription carefully.   For the next 24 hours you can use MyChart to ask questions about today's visit, request a non-urgent call back, or ask for a work or school excuse. You will get an email in the next two days asking about your experience. I hope that your e-visit   has been valuable and will speed your recovery.  

## 2022-03-01 NOTE — Progress Notes (Signed)
I have spent 5 minutes in review of e-visit questionnaire, review and updating patient chart, medical decision making and response to patient.    C[place] , PA-C    

## 2022-03-03 ENCOUNTER — Telehealth: Payer: 59 | Admitting: Family

## 2022-03-03 DIAGNOSIS — H109 Unspecified conjunctivitis: Secondary | ICD-10-CM

## 2022-03-03 MED ORDER — POLYMYXIN B-TRIMETHOPRIM 10000-0.1 UNIT/ML-% OP SOLN: 1.0000 [drp] | Freq: Four times a day (QID) | OPHTHALMIC | 0 refills | Status: DC

## 2022-03-03 NOTE — Progress Notes (Signed)

## 2022-03-08 ENCOUNTER — Telehealth: Payer: 59 | Admitting: Physician Assistant

## 2022-03-08 DIAGNOSIS — J069 Acute upper respiratory infection, unspecified: Secondary | ICD-10-CM

## 2022-03-08 MED ORDER — AMOXICILLIN-POT CLAVULANATE 875-125 MG PO TABS: 1.0000 | ORAL_TABLET | Freq: Two times a day (BID) | ORAL | 0 refills | Status: DC

## 2022-03-08 NOTE — Progress Notes (Signed)

## 2022-07-02 ENCOUNTER — Telehealth: Payer: 59 | Admitting: Nurse Practitioner

## 2022-07-02 DIAGNOSIS — R300 Dysuria: Secondary | ICD-10-CM

## 2022-07-02 MED ORDER — CEPHALEXIN 500 MG PO CAPS: 500.0000 mg | ORAL_CAPSULE | Freq: Two times a day (BID) | ORAL | 0 refills | Status: AC

## 2022-07-02 NOTE — Progress Notes (Signed)

## 2022-09-25 ENCOUNTER — Other Ambulatory Visit (HOSPITAL_COMMUNITY): Payer: Self-pay | Admitting: Family Medicine

## 2022-09-25 DIAGNOSIS — R1011 Right upper quadrant pain: Secondary | ICD-10-CM

## 2022-10-05 ENCOUNTER — Encounter: Payer: Self-pay | Admitting: Emergency Medicine

## 2022-10-05 ENCOUNTER — Emergency Department (HOSPITAL_COMMUNITY)
Admission: EM | Admit: 2022-10-05 | Discharge: 2022-10-05 | Disposition: A | Discharge status: Home / Self Care | Payer: 59 | Attending: Emergency Medicine | Admitting: Emergency Medicine

## 2022-10-05 ENCOUNTER — Other Ambulatory Visit: Payer: Self-pay

## 2022-10-05 ENCOUNTER — Encounter (HOSPITAL_COMMUNITY): Payer: Self-pay

## 2022-10-05 ENCOUNTER — Ambulatory Visit
Admission: EM | Admit: 2022-10-05 | Discharge: 2022-10-05 | Disposition: A | Discharge status: Home / Self Care | Payer: 59 | Source: Home / Self Care

## 2022-10-05 DIAGNOSIS — T63441A Toxic effect of venom of bees, accidental (unintentional), initial encounter: Secondary | ICD-10-CM

## 2022-10-05 DIAGNOSIS — S80261A Insect bite (nonvenomous), right knee, initial encounter: Secondary | ICD-10-CM | POA: Insufficient documentation

## 2022-10-05 DIAGNOSIS — S80262A Insect bite (nonvenomous), left knee, initial encounter: Secondary | ICD-10-CM | POA: Insufficient documentation

## 2022-10-05 DIAGNOSIS — E039 Hypothyroidism, unspecified: Secondary | ICD-10-CM | POA: Diagnosis not present

## 2022-10-05 DIAGNOSIS — S60561A Insect bite (nonvenomous) of right hand, initial encounter: Secondary | ICD-10-CM | POA: Diagnosis not present

## 2022-10-05 DIAGNOSIS — W57XXXA Bitten or stung by nonvenomous insect and other nonvenomous arthropods, initial encounter: Secondary | ICD-10-CM | POA: Diagnosis not present

## 2022-10-05 DIAGNOSIS — J45909 Unspecified asthma, uncomplicated: Secondary | ICD-10-CM | POA: Diagnosis not present

## 2022-10-05 LAB — BASIC METABOLIC PANEL
Anion gap: 14 (ref 5–15)
BUN: 13 mg/dL (ref 6–20)
CO2: 20 mmol/L — ABNORMAL LOW (ref 22–32)
Calcium: 9.6 mg/dL (ref 8.9–10.3)
Chloride: 103 mmol/L (ref 98–111)
Creatinine, Ser: 0.78 mg/dL (ref 0.44–1.00)
GFR, Estimated: >60 mL/min (ref >60)
Glucose, Bld: 89 mg/dL (ref 70–99)
Potassium: 3.7 mmol/L (ref 3.5–5.1)
Sodium: 137 mmol/L (ref 135–145)

## 2022-10-05 LAB — CBC WITH DIFFERENTIAL/PLATELET
Abs Immature Granulocytes: 0.02 10*3/uL (ref 0.00–0.07)
Basophils Absolute: 0 10*3/uL (ref 0.0–0.1)
Basophils Relative: 1 %
Eosinophils Absolute: 0.1 10*3/uL (ref 0.0–0.5)
Eosinophils Relative: 1 %
HCT: 42 % (ref 36.0–46.0)
Hemoglobin: 13.5 g/dL (ref 12.0–15.0)
Immature Granulocytes: 0 %
Lymphocytes Relative: 11 %
Lymphs Abs: 0.9 10*3/uL (ref 0.7–4.0)
MCH: 31.3 pg (ref 26.0–34.0)
MCHC: 32.1 g/dL (ref 30.0–36.0)
MCV: 97.4 fL (ref 80.0–100.0)
Monocytes Absolute: 0.4 10*3/uL (ref 0.1–1.0)
Monocytes Relative: 5 %
Neutro Abs: 6.8 10*3/uL (ref 1.7–7.7)
Neutrophils Relative %: 82 %
Platelets: 351 10*3/uL (ref 150–400)
RBC: 4.31 MIL/uL (ref 3.87–5.11)
RDW: 12.2 % (ref 11.5–15.5)
WBC: 8.2 10*3/uL (ref 4.0–10.5)
nRBC: 0 % (ref 0.0–0.2)

## 2022-10-05 MED ORDER — IBUPROFEN 600 MG PO TABS: 600.0000 mg | ORAL_TABLET | Freq: Four times a day (QID) | ORAL | 0 refills | Status: DC | PRN

## 2022-10-05 MED ORDER — DIPHENHYDRAMINE HCL 50 MG/ML IJ SOLN
25.0000 mg | Freq: Once | INTRAMUSCULAR | Status: AC
  Administered 2022-10-05: 25 mg via INTRAVENOUS
  Filled 2022-10-05: qty 1

## 2022-10-05 MED ORDER — ONDANSETRON HCL 4 MG/2ML IJ SOLN
4.0000 mg | Freq: Once | INTRAMUSCULAR | Status: AC
  Administered 2022-10-05: 4 mg via INTRAVENOUS
  Filled 2022-10-05: qty 2

## 2022-10-05 MED ORDER — KETOROLAC TROMETHAMINE 30 MG/ML IJ SOLN
30.0000 mg | Freq: Once | INTRAMUSCULAR | Status: AC
  Administered 2022-10-05: 30 mg via INTRAVENOUS
  Filled 2022-10-05: qty 1

## 2022-10-05 MED ORDER — FAMOTIDINE IN NACL 20-0.9 MG/50ML-% IV SOLN
20.0000 mg | Freq: Once | INTRAVENOUS | Status: AC
Start: 2022-10-05 — End: 2022-10-05
  Administered 2022-10-05: 20 mg via INTRAVENOUS
  Filled 2022-10-05: qty 50

## 2022-10-05 MED ORDER — HYDROCODONE-ACETAMINOPHEN 5-325 MG PO TABS
1.0000 | ORAL_TABLET | Freq: Four times a day (QID) | ORAL | 0 refills | Status: DC | PRN
Start: 2022-10-05 — End: 2022-11-07

## 2022-10-05 MED ORDER — DEXAMETHASONE SODIUM PHOSPHATE 10 MG/ML IJ SOLN
10.0000 mg | Freq: Once | INTRAMUSCULAR | Status: AC
  Administered 2022-10-05: 10 mg via INTRAMUSCULAR

## 2022-10-05 MED ORDER — SODIUM CHLORIDE 0.9 % IV BOLUS
1000.0000 mL | Freq: Once | INTRAVENOUS | Status: AC
  Administered 2022-10-05: 1000 mL via INTRAVENOUS

## 2022-10-05 MED ORDER — MORPHINE SULFATE (PF) 4 MG/ML IV SOLN
4.0000 mg | Freq: Once | INTRAVENOUS | Status: AC
  Administered 2022-10-05: 4 mg via INTRAVENOUS
  Filled 2022-10-05: qty 1

## 2022-10-05 NOTE — ED Triage Notes (Signed)
Was swarmed by something that stung her while weedeating around 45 min ago. Was given 20 ml of children's liquid benadryl. No previous history of allergy reaction to insect bites/stings. Is moaning in pain in triage with obvious antalgic gait and multiple reddened areas across body. Believes it may have been yellow jackets

## 2022-10-05 NOTE — ED Triage Notes (Signed)
Pt arrives with c/o insect bite/sting that happened about 2 hours ago. Pt was seen at [facility] for the same and was given IM steroids. Per pt, the pain has increased. Pt red swollen area on her bilateral knees, right thigh, back, and right hand. Pt denies SOB, CP, or difficulty talking.

## 2022-10-05 NOTE — ED Provider Notes (Signed)
[facility]    CSN: [id number] Arrival date & time: 10/05/22  1509      History   Chief Complaint Chief Complaint  Patient presents with   Insect Bite    HPI Megan Lara is a 42 y.o. female.   Here today for evaluation of multiple bee stings that occurred about 45 minutes ago.  She states that she was swarmed by some sort of bee or insect she is unsure what but thinks may be yellow jackets.  She was stung approximately 15 times.  She has never had allergy to bee stings in the past.  She denies any trouble breathing or swallowing.  She did take 20 mL of children's Benadryl and states she has had some lightheadedness.  She notes she does have significant pain from stings.  The history is provided by the patient.    Past Medical History:  Diagnosis Date   Adjustment disorder with mixed anxiety and depressed mood 12/17/2016   Anemia    Anemia affecting pregnancy, antepartum 12/17/2016   Asthma    Chorioamnionitis, delivered, current hospitalization 12/17/2016   Depression    Hx of cellulitis of skin with lymphangitis    from last CS   Hypothyroidism 12/17/2016   Thyroid dysfunction in pregnancy in third trimester     Patient Active Problem List   Diagnosis Date Noted   Complicated UTI (urinary tract infection) 07/01/2019   Fever 06/26/2019   Leukocytosis 06/26/2019   Sepsis (HCC) 06/26/2019   Meningitis 03/23/2019   Hematoma of rectus sheath 05/28/2018   Status post repeat low transverse cesarean section and BTL 05/25/2018   Postpartum care following cesarean delivery (3/23) 05/25/2018   Carpal tunnel syndrome, left upper limb 01/13/2017   Carpal tunnel syndrome, right upper limb 01/13/2017   Hypothyroidism 12/17/2016   Adjustment disorder with mixed anxiety and depressed mood 12/17/2016    Past Surgical History:  Procedure Laterality Date   CESAREAN SECTION N/A 12/16/2016   Procedure: CESAREAN SECTION;  Surgeon: Shea Evans, MD;   Location: [facility] BIRTHING SUITES;  Service: Obstetrics;  Laterality: N/A;   CESAREAN SECTION WITH BILATERAL TUBAL LIGATION Bilateral 05/25/2018   Procedure: Repeat CESAREAN SECTION WITH BILATERAL TUBAL LIGATION;  Surgeon: Shea Evans, MD;  Location: [facility] LD ORS;  Service: Obstetrics;  Laterality: Bilateral;  EDD: 05/22/18   EXTRACORPOREAL SHOCK WAVE LITHOTRIPSY Right 06/24/2019   Procedure: EXTRACORPOREAL SHOCK WAVE LITHOTRIPSY (ESWL);  Surgeon: Ihor Gully, MD;  Location: [facility];  Service: Urology;  Laterality: Right;  75 MINS   GANGLION CYST EXCISION Left 4/15   3 rd toe   HERNIA REPAIR Right 1991   IR ANGIOGRAM PELVIS SELECTIVE OR SUPRASELECTIVE  05/28/2018   IR ANGIOGRAM SELECTIVE EACH ADDITIONAL VESSEL  05/28/2018   IR ANGIOGRAM SELECTIVE EACH ADDITIONAL VESSEL  05/28/2018   IR ANGIOGRAM SELECTIVE EACH ADDITIONAL VESSEL  05/28/2018   IR EMBO ART  VEN HEMORR LYMPH EXTRAV  INC GUIDE ROADMAPPING  05/28/2018   IR US GUIDE VASC ACCESS RIGHT  05/28/2018   TEE WITHOUT CARDIOVERSION N/A 06/30/2019   Procedure: TRANSESOPHAGEAL ECHOCARDIOGRAM (TEE);  Surgeon: Chrystie Nose, MD;  Location: [facility] ENDOSCOPY;  Service: Cardiovascular;  Laterality: N/A;   TONSILLECTOMY  1997    OB History     Gravida  2   Para  2   Term  2   Preterm      AB      Living  1      SAB  IAB      Ectopic      Multiple  0   Live Births  1            Home Medications    Prior to Admission medications   Medication Sig Start Date End Date Taking? Authorizing Provider  albuterol (PROVENTIL HFA;VENTOLIN HFA) 108 (90 BASE) MCG/ACT inhaler Inhale 2 puffs into the lungs every 6 (six) hours as needed for wheezing.    [provider]  benzonatate (TESSALON) 100 MG capsule Take 1 capsule (100 mg total) by mouth 3 (three) times daily as needed for cough. 03/01/22   Waldon Merl, PA-C  busPIRone (BUSPAR) 5 MG tablet Take 5 mg by mouth 2 (two) times daily.    Emergency, Nurse, RN   cetirizine (ZYRTEC) 10 MG tablet Take 10 mg by mouth at bedtime.    [provider]  fluticasone (FLONASE) 50 MCG/ACT nasal spray Place 2 sprays into both nostrils daily. 01/28/22   Margaretann Loveless, PA-C  hydrOXYzine (ATARAX) 10 MG tablet Take 1 tablet (10 mg total) by mouth 3 (three) times daily as needed. 11/05/21   Claiborne Rigg, NP  levothyroxine (SYNTHROID, LEVOTHROID) 75 MCG tablet Take 75 mcg by mouth every other day.     [provider]  predniSONE (DELTASONE) 20 MG tablet Take 2 tablets (40 mg total) by mouth daily with breakfast. 03/01/22   Waldon Merl, PA-C  rizatriptan (MAXALT-MLT) 10 MG disintegrating tablet Take 1 tablet (10 mg total) by mouth as needed for migraine. May repeat in 2 hours if needed 07/05/19   Lomax, Amy, NP  sertraline (ZOLOFT) 100 MG tablet Take 100 mg by mouth at bedtime.    [provider]  trimethoprim-polymyxin b (POLYTRIM) ophthalmic solution Place 1 drop into the left eye every 6 (six) hours. 03/03/22   Junie Spencer, FNP    Family History Family History  Adopted: Yes  Problem Relation Age of Onset   Uterine cancer Maternal Grandmother     Social History Social History   Tobacco Use   Smoking status: Former    Current packs/day: 0.00    Average packs/day: 0.5 packs/day for 7.0 years (3.5 ttl pk-yrs)    Types: Cigarettes    Start date: 03/04/2006    Quit date: 03/04/2013    Years since quitting: 9.5   Smokeless tobacco: Never   Tobacco comments:    does some vape  Vaping Use   Vaping status: Never Used  Substance Use Topics   Alcohol use: Not Currently    Alcohol/week: 0.0 standard drinks of alcohol    Comment: 9 a week   Drug use: No     Allergies   Patient has no known allergies.   Review of Systems Review of Systems  Constitutional:  Negative for chills and fever.  HENT:  Negative for facial swelling and trouble swallowing.   Eyes:  Negative for discharge and redness.  Respiratory:   Negative for shortness of breath.   Gastrointestinal:  Negative for nausea and vomiting.  Skin:  Positive for color change.  Neurological:  Positive for light-headedness.     Physical Exam Triage Vital Signs ED Triage Vitals  Encounter Vitals Group     BP 10/05/22 1522 119/77     Systolic BP Percentile --      Diastolic BP Percentile --      Pulse Rate 10/05/22 1522 90     Resp 10/05/22 1522 20     Temp  10/05/22 1522 99.1 F (37.3 C)     Temp Source 10/05/22 1522 Oral     SpO2 10/05/22 1522 98 %     Weight --      Height --      Head Circumference --      Peak Flow --      Pain Score 10/05/22 1523 7     Pain Loc --      Pain Education --      Exclude from Growth Chart --    No data found.  Updated Vital Signs BP 119/77 (BP Location: Left Arm)   Pulse 90   Temp 99.1 F (37.3 C) (Oral)   Resp 20   SpO2 98%   Physical Exam Vitals and nursing note reviewed.  Constitutional:      General: She is not in acute distress.    Appearance: Normal appearance. She is not ill-appearing.  HENT:     Head: Normocephalic and atraumatic.     Mouth/Throat:     Mouth: Mucous membranes are moist.     Pharynx: Oropharynx is clear. No oropharyngeal exudate or posterior oropharyngeal erythema.  Eyes:     Conjunctiva/sclera: Conjunctivae normal.  Cardiovascular:     Rate and Rhythm: Normal rate.  Pulmonary:     Effort: Pulmonary effort is normal. No respiratory distress.  Skin:    Comments: Approx 15 scattered erythematous wheals with central pinpoint wound consistent with bee sting, present to bilateral legs, less so on arms, one sting to low back  Neurological:     Mental Status: She is alert.  Psychiatric:        Mood and Affect: Mood normal.        Behavior: Behavior normal.        Thought Content: Thought content normal.      UC Treatments / Results  Labs (all labs ordered are listed, but only abnormal results are displayed) Labs Reviewed - No data to  display  EKG   Radiology No results found.  Procedures Procedures (including critical care time)  Medications Ordered in UC Medications  dexamethasone (DECADRON) injection 10 mg (has no administration in time range)    Initial Impression / Assessment and Plan / UC Course  I have reviewed the triage vital signs and the nursing notes.  Pertinent labs & imaging results that were available during my care of the patient were reviewed by me and considered in my medical decision making (see chart for details).    10 mg dexamethasone administered in office IM. Patient observed for 30 minutes- pain unable to be controlled- recommended further evaluation in the ED. Significant other with her will drive her. No signs of anaphylaxis, shortness of breath, trouble swallowing noted prior to discharge from urgent care.  Final Clinical Impressions(s) / UC Diagnoses   Final diagnoses:  Bee sting reaction, accidental or unintentional, initial encounter   Discharge Instructions   None    ED Prescriptions   None    PDMP not reviewed this encounter.   Tomi Bamberger, PA-C 10/05/22 1556

## 2022-10-05 NOTE — ED Notes (Signed)
Patient is being discharged from the Urgent Care and sent to the Emergency Department via Private Vehicle . Per Provider Guy Sandifer), patient is in need of higher level of care due to Complexity of care needed. Patient is aware and verbalizes understanding of plan of care.  Vitals:   10/05/22 1522  BP: 119/77  Pulse: 90  Resp: 20  Temp: 99.1 F (37.3 C)  SpO2: 98%

## 2022-10-05 NOTE — ED Notes (Signed)
Patient verbalizes understanding of discharge instructions. Opportunity for questioning and answers were provided. Armband removed by staff, pt discharged from ED. Pt ambulatory to ED waiting room with steady gait.  

## 2022-10-05 NOTE — ED Provider Notes (Signed)
[place] EM[facility]ENC[facility] DE[facility] AT [facility] Provider Note   CSN: [id number] Arrival date & time: 10/05/22  1622     History  Chief Complaint  Patient presents with   Insect Bite    Megan Lara is a 42 y.o. female.  Pt is a 42 yo female with pmhx significant for asthma, depression, and hypothyroidism.  Pt said she was weeding and was stung by several yellow jackets.  She initially went to [facility] and was given a dose of decadron (around 1600).  She said she is having a lot of pain.  She did take a dose of children's benadryl prior to her visit to UC (around 1530).  Pt denies sob.       Home Medications Prior to Admission medications   Medication Sig Start Date End Date Taking? Authorizing Provider  HYDROcodone-acetaminophen (NORCO/VICODIN) 5-325 MG tablet Take 1 tablet by mouth every 6 (six) hours as needed. 10/05/22  Yes Dakermandji, Caitlin, DO  ibuprofen (ADVIL) 600 MG tablet Take 1 tablet (600 mg total) by mouth every 6 (six) hours as needed. 10/05/22  Yes Jacalyn Lefevre, MD  albuterol (PROVENTIL HFA;VENTOLIN HFA) 108 (90 BASE) MCG/ACT inhaler Inhale 2 puffs into the lungs every 6 (six) hours as needed for wheezing.    [provider]  benzonatate (TESSALON) 100 MG capsule Take 1 capsule (100 mg total) by mouth 3 (three) times daily as needed for cough. 03/01/22   Waldon Merl, PA-C  busPIRone (BUSPAR) 5 MG tablet Take 5 mg by mouth 2 (two) times daily.    Emergency, Nurse, RN  cetirizine (ZYRTEC) 10 MG tablet Take 10 mg by mouth at bedtime.    [provider]  fluticasone (FLONASE) 50 MCG/ACT nasal spray Place 2 sprays into both nostrils daily. 01/28/22   Margaretann Loveless, PA-C  hydrOXYzine (ATARAX) 10 MG tablet Take 1 tablet (10 mg total) by mouth 3 (three) times daily as needed. 11/05/21   Claiborne Rigg, NP  levothyroxine (SYNTHROID, LEVOTHROID) 75 MCG tablet Take 75 mcg by mouth every other day.     [provider]   predniSONE (DELTASONE) 20 MG tablet Take 2 tablets (40 mg total) by mouth daily with breakfast. 03/01/22   Waldon Merl, PA-C  rizatriptan (MAXALT-MLT) 10 MG disintegrating tablet Take 1 tablet (10 mg total) by mouth as needed for migraine. May repeat in 2 hours if needed 07/05/19   Lomax, Amy, NP  sertraline (ZOLOFT) 100 MG tablet Take 100 mg by mouth at bedtime.    [provider]  trimethoprim-polymyxin b (POLYTRIM) ophthalmic solution Place 1 drop into the left eye every 6 (six) hours. 03/03/22   Junie Spencer, FNP      Allergies    Patient has no known allergies.    Review of Systems   Review of Systems  Musculoskeletal:  Positive for myalgias.  All other systems reviewed and are negative.   Physical Exam Updated Vital Signs BP 126/74 (BP Location: Right Arm)   Pulse 84   Temp 98.4 F (36.9 C) (Oral)   Resp 19   Wt 67.6 kg   SpO2 100%   BMI 23.34 kg/m  Physical Exam Vitals and nursing note reviewed.  Constitutional:      Appearance: Normal appearance.  HENT:     Head: Normocephalic and atraumatic.     Right Ear: External ear normal.     Left Ear: External ear normal.     Nose: Nose normal.  Mouth/Throat:     Mouth: Mucous membranes are moist.     Pharynx: Oropharynx is clear.  Eyes:     Extraocular Movements: Extraocular movements intact.     Conjunctiva/sclera: Conjunctivae normal.     Pupils: Pupils are equal, round, and reactive to light.  Cardiovascular:     Rate and Rhythm: Normal rate and regular rhythm.     Pulses: Normal pulses.     Heart sounds: Normal heart sounds.  Pulmonary:     Effort: Pulmonary effort is normal.     Breath sounds: Normal breath sounds.  Abdominal:     General: Abdomen is flat. Bowel sounds are normal.     Palpations: Abdomen is soft.  Musculoskeletal:        General: Normal range of motion.     Cervical back: Normal range of motion and neck supple.  Skin:    Capillary Refill: Capillary refill takes less  than 2 seconds.     Comments: Multiple insect bites  Neurological:     General: No focal deficit present.     Mental Status: She is alert and oriented to person, place, and time.  Psychiatric:        Mood and Affect: Mood normal.        Behavior: Behavior normal.     ED Results / Procedures / Treatments   Labs (all labs ordered are listed, but only abnormal results are displayed) Labs Reviewed  BASIC METABOLIC PANEL - Abnormal; Notable for the following components:      Result Value   CO2 20 (*)    All other components within normal limits  CBC WITH DIFFERENTIAL/PLATELET    EKG None  Radiology No results found.  Procedures Procedures    Medications Ordered in ED Medications  sodium chloride 0.9 % bolus 1,000 mL (0 mLs Intravenous Stopped 10/05/22 1943)  diphenhydrAMINE (BENADRYL) injection 25 mg (25 mg Intravenous Given 10/05/22 1812)  famotidine (PEPCID) IVPB 20 mg premix (0 mg Intravenous Stopped 10/05/22 1914)  ketorolac (TORADOL) 30 MG/ML injection 30 mg (30 mg Intravenous Given 10/05/22 1813)  morphine (PF) 4 MG/ML injection 4 mg (4 mg Intravenous Given 10/05/22 1916)  ondansetron (ZOFRAN) injection 4 mg (4 mg Intravenous Given 10/05/22 1915)    ED Course/ Medical Decision Making/ A&P                                 Medical Decision Making Amount and/or Complexity of Data Reviewed Labs: ordered.  Risk Prescription drug management.   This patient presents to the ED for concern of insect bites, this involves an extensive number of treatment options, and is a complaint that carries with it a high risk of complications and morbidity.  The differential diagnosis includes anaphylaxis, allergic rxn   Co morbidities that complicate the patient evaluation  asthma, depression, and hypothyroidism   Additional history obtained:  Additional history obtained from epic chart review External records from outside source obtained and reviewed including husband   Lab  Tests:  I Ordered, and personally interpreted labs.  The pertinent results include:  cbc nl, bmp nl   Cardiac Monitoring:  The patient was maintained on a cardiac monitor.  I personally viewed and interpreted the cardiac monitored which showed an underlying rhythm of: nsr   Medicines ordered and prescription drug management:  I ordered medication including ivfs/benadryl/pepcid/toradol  for sx  Reevaluation of the patient after these medicines showed that  the patient improved I have reviewed the patients home medicines and have made adjustments as needed   Critical Interventions:  Pain control   Problem List / ED Course:  Multiple hornet stings:  pt is feeling much better after sx.  She is stable for d/c.  Return if worse.  F/u with pcp.   Reevaluation:  After the interventions noted above, I reevaluated the patient and found that they have :improved   Social Determinants of Health:  Lives at home   Dispostion:  After consideration of the diagnostic results and the patients response to treatment, I feel that the patent would benefit from discharge with outpatient f/u.          Final Clinical Impression(s) / ED Diagnoses Final diagnoses:  Insect bite, unspecified site, initial encounter    Rx / DC Orders ED Discharge Orders          Ordered    ibuprofen (ADVIL) 600 MG tablet  Every 6 hours PRN        10/05/22 1947    HYDROcodone-acetaminophen (NORCO/VICODIN) 5-325 MG tablet  Every 6 hours PRN        10/05/22 1949              Jacalyn Lefevre, MD 10/05/22 1950

## 2022-10-11 ENCOUNTER — Ambulatory Visit (HOSPITAL_COMMUNITY)
Admission: RE | Admit: 2022-10-11 | Discharge: 2022-10-11 | Disposition: A | Discharge status: Home / Self Care | Payer: 59 | Source: Ambulatory Visit | Attending: Family Medicine | Admitting: Family Medicine

## 2022-10-11 DIAGNOSIS — R1011 Right upper quadrant pain: Secondary | ICD-10-CM | POA: Insufficient documentation

## 2022-10-11 MED ORDER — TECHNETIUM TC 99M MEBROFENIN IV KIT
5.0000 | PACK | Freq: Once | INTRAVENOUS | Status: AC | PRN
  Administered 2022-10-11: 5 via INTRAVENOUS

## 2022-11-07 ENCOUNTER — Telehealth: Payer: 59 | Admitting: Physician Assistant

## 2022-11-07 DIAGNOSIS — R3989 Other symptoms and signs involving the genitourinary system: Secondary | ICD-10-CM | POA: Diagnosis not present

## 2022-11-07 MED ORDER — FLUCONAZOLE 150 MG PO TABS: 150.0000 mg | ORAL_TABLET | Freq: Once | ORAL | 0 refills | Status: AC

## 2022-11-07 MED ORDER — NITROFURANTOIN MONOHYD MACRO 100 MG PO CAPS
100.0000 mg | ORAL_CAPSULE | Freq: Two times a day (BID) | ORAL | 0 refills | Status: DC
Start: 2022-11-07 — End: 2022-12-27

## 2022-11-07 NOTE — Progress Notes (Signed)

## 2022-11-07 NOTE — Addendum Note (Signed)
Addended by: Waldon Merl on: 11/07/2022 01:04 PM   Modules accepted: Orders

## 2022-12-27 ENCOUNTER — Ambulatory Visit: Payer: 59

## 2022-12-27 ENCOUNTER — Telehealth: Payer: Self-pay

## 2022-12-27 ENCOUNTER — Ambulatory Visit
Admission: RE | Admit: 2022-12-27 | Discharge: 2022-12-27 | Disposition: A | Discharge status: Home / Self Care | Payer: 59 | Source: Ambulatory Visit

## 2022-12-27 VITALS — BP 109/69 | HR 81 | Temp 99.0°F | Resp 18 | Ht 67.0 in | Wt 150.0 lb

## 2022-12-27 DIAGNOSIS — R059 Cough, unspecified: Secondary | ICD-10-CM | POA: Diagnosis not present

## 2022-12-27 DIAGNOSIS — J45901 Unspecified asthma with (acute) exacerbation: Secondary | ICD-10-CM

## 2022-12-27 DIAGNOSIS — R300 Dysuria: Secondary | ICD-10-CM | POA: Insufficient documentation

## 2022-12-27 MED ORDER — AZITHROMYCIN 250 MG PO TABS: 250.0000 mg | ORAL_TABLET | Freq: Every day | ORAL | 0 refills | Status: DC

## 2022-12-27 MED ORDER — AMOXICILLIN 500 MG PO CAPS: 500.0000 mg | ORAL_CAPSULE | Freq: Three times a day (TID) | ORAL | 0 refills | Status: DC

## 2022-12-27 MED ORDER — PREDNISONE 20 MG PO TABS: 40.0000 mg | ORAL_TABLET | Freq: Every day | ORAL | 0 refills | Status: AC

## 2022-12-27 NOTE — Telephone Encounter (Signed)
Patient calling back to let us know only "2 Rx's" were at the pharmacy "not the steroid".   Message referred to Provider for review and to be sent if advised.  B. Roten CMA

## 2022-12-27 NOTE — ED Triage Notes (Signed)
"  About 3 days ago started feeling bad, dizzy, light headed, a lot of pain in my back and my lungs feel tight, I have asthma and have had this episode before". Some runny nose, congestion "as of this morning". No "Chest pain". No fever. "My son has Pneumonia". "My cough is gone and was really bad at night recently but now these symptoms".

## 2023-01-06 ENCOUNTER — Encounter: Payer: Self-pay | Admitting: Gastroenterology

## 2023-01-25 NOTE — ED Provider Notes (Signed)
[facility]    CSN: [id number] Arrival date & time: 12/27/22  1450      History   Chief Complaint Chief Complaint  Patient presents with   Dizziness    Right/mid back pain, lung tightness, aching all over - Entered by patient    HPI Megan Lara is a 41 y.o. female.   Patient here today for evaluation of not feeling well, dizziness, lightheaded and some pain in her back with associated tightness in her lungs.  She states she has had asthma and this is similar to her previous exacerbations.  She has had some runny nose and congestion.  She denies any chest pain has not had fever.  She notes her son has pneumonia.  She notes that cough is worse at nighttime.  She has taken over-the-counter medication and her inhaler without resolution.  The history is provided by the patient.  Dizziness Associated symptoms: shortness of breath   Associated symptoms: no chest pain, no diarrhea, no nausea and no vomiting     Past Medical History:  Diagnosis Date   Adjustment disorder with mixed anxiety and depressed mood 12/17/2016   Anemia    Anemia affecting pregnancy, antepartum 12/17/2016   Asthma    Chorioamnionitis, delivered, current hospitalization 12/17/2016   Depression    Hx of cellulitis of skin with lymphangitis    from last CS   Hypothyroidism 12/17/2016   Thyroid dysfunction in pregnancy in third trimester     Patient Active Problem List   Diagnosis Date Noted   Dysuria 12/27/2022   Complicated UTI (urinary tract infection) 07/01/2019   Fever 06/26/2019   Leukocytosis 06/26/2019   Sepsis (HCC) 06/26/2019   Meningitis 03/23/2019   Hematoma of rectus sheath 05/28/2018   Status post repeat low transverse cesarean section and BTL 05/25/2018   Postpartum care following cesarean delivery (3/23) 05/25/2018   Asthma 07/20/2017   Anxiety 07/15/2017   Depressive disorder 07/15/2017   Carpal tunnel syndrome, left upper limb 01/13/2017   Carpal tunnel  syndrome, right upper limb 01/13/2017   Hypothyroidism 12/17/2016   Adjustment disorder with mixed anxiety and depressed mood 12/17/2016   Foreign body (FB) in soft tissue 10/16/2015    Past Surgical History:  Procedure Laterality Date   CESAREAN SECTION N/A 12/16/2016   Procedure: CESAREAN SECTION;  Surgeon: Shea Evans, MD;  Location: [facility] BIRTHING SUITES;  Service: Obstetrics;  Laterality: N/A;   CESAREAN SECTION WITH BILATERAL TUBAL LIGATION Bilateral 05/25/2018   Procedure: Repeat CESAREAN SECTION WITH BILATERAL TUBAL LIGATION;  Surgeon: Shea Evans, MD;  Location: [facility] LD ORS;  Service: Obstetrics;  Laterality: Bilateral;  EDD: 05/22/18   EXTRACORPOREAL SHOCK WAVE LITHOTRIPSY Right 06/24/2019   Procedure: EXTRACORPOREAL SHOCK WAVE LITHOTRIPSY (ESWL);  Surgeon: Ihor Gully, MD;  Location: [facility];  Service: Urology;  Laterality: Right;  75 MINS   GANGLION CYST EXCISION Left 4/15   3 rd toe   HERNIA REPAIR Right 1991   IR ANGIOGRAM PELVIS SELECTIVE OR SUPRASELECTIVE  05/28/2018   IR ANGIOGRAM SELECTIVE EACH ADDITIONAL VESSEL  05/28/2018   IR ANGIOGRAM SELECTIVE EACH ADDITIONAL VESSEL  05/28/2018   IR ANGIOGRAM SELECTIVE EACH ADDITIONAL VESSEL  05/28/2018   IR EMBO ART  VEN HEMORR LYMPH EXTRAV  INC GUIDE ROADMAPPING  05/28/2018   IR US GUIDE VASC ACCESS RIGHT  05/28/2018   TEE WITHOUT CARDIOVERSION N/A 06/30/2019   Procedure: TRANSESOPHAGEAL ECHOCARDIOGRAM (TEE);  Surgeon: Chrystie Nose, MD;  Location: [facility] ENDOSCOPY;  Service: Cardiovascular;  Laterality: N/A;   TONSILLECTOMY  1997    OB History     Gravida  2   Para  2   Term  2   Preterm      AB      Living  1      SAB      IAB      Ectopic      Multiple  0   Live Births  1            Home Medications    Prior to Admission medications   Medication Sig Start Date End Date Taking? Authorizing Provider  ALPRAZolam Prudy Feeler) 0.5 MG tablet Take 0.5 mg by mouth as directed. 02/06/14  Yes  [provider]  amoxicillin (AMOXIL) 500 MG capsule Take 1 capsule (500 mg total) by mouth 3 (three) times daily. 12/27/22  Yes Tomi Bamberger, PA-C  azithromycin (ZITHROMAX) 250 MG tablet Take 1 tablet (250 mg total) by mouth daily. Take first 2 tablets together, then 1 every day until finished. 12/27/22  Yes Tomi Bamberger, PA-C  buPROPion (WELLBUTRIN) 100 MG tablet Take 100 mg by mouth 2 (two) times daily. 10/16/15  Yes [provider]  busPIRone (BUSPAR) 10 MG tablet Take 10 mg by mouth 2 (two) times daily.   Yes [provider]  cetirizine (ZYRTEC) 10 MG tablet Take 10 mg by mouth daily. 06/12/09  Yes [provider]  dicyclomine (BENTYL) 10 MG capsule Take 10 mg by mouth as directed. 10/15/22  Yes [provider]  escitalopram (LEXAPRO) 20 MG tablet Take 20 mg by mouth daily. 10/16/15  Yes [provider]  fluconazole (DIFLUCAN) 150 MG tablet Take 150 mg by mouth once. 11/07/22  Yes [provider]  levothyroxine (SYNTHROID, LEVOTHROID) 75 MCG tablet Take 75 mcg by mouth every other day. Last taken: .   Yes [provider]  sertraline (ZOLOFT) 100 MG tablet Take 100 mg by mouth at bedtime.   Yes [provider]  albuterol (PROVENTIL HFA;VENTOLIN HFA) 108 (90 BASE) MCG/ACT inhaler Inhale 2 puffs into the lungs every 6 (six) hours as needed for wheezing.    [provider]  cetirizine (ZYRTEC) 10 MG tablet Take 10 mg by mouth at bedtime.    [provider]  Cetirizine HCl (ZYRTEC ALLERGY) 10 MG CAPS Take 10 mg by mouth daily at 6 (six) AM.    [provider]  Ferrous Sulfate (IRON) 325 (65 Fe) MG TABS 1 tablet Orally Once a day    [provider]  ferrous sulfate 325 (65 FE) MG EC tablet Take 325 mg by mouth daily with breakfast.    [provider]  FLUoxetine (PROZAC) 40 MG capsule Take 40 mg by mouth daily.    [provider]  rizatriptan (MAXALT-MLT) 10 MG  disintegrating tablet Take 1 tablet (10 mg total) by mouth as needed for migraine. May repeat in 2 hours if needed 07/05/19   Lomax, Amy, NP    Family History Family History  Adopted: Yes  Problem Relation Age of Onset   Uterine cancer Maternal Grandmother     Social History Social History   Tobacco Use   Smoking status: Former    Current packs/day: 0.00    Average packs/day: 0.5 packs/day for 7.0 years (3.5 ttl pk-yrs)    Types: Cigarettes    Start date: 03/04/2006    Quit date: 03/04/2013    Years since quitting: 9.9   Smokeless tobacco: Never  Tobacco comments:    does some vape  Vaping Use   Vaping status: Never Used  Substance Use Topics   Alcohol use: Not Currently    Comment: Weekly.   Drug use: No     Allergies   Nitrofurantoin and Keflex [cephalexin]   Review of Systems Review of Systems  Constitutional:  Negative for chills and fever.  HENT:  Positive for congestion. Negative for ear pain and sore throat.   Eyes:  Negative for discharge and redness.  Respiratory:  Positive for cough, shortness of breath and wheezing.   Cardiovascular:  Negative for chest pain.  Gastrointestinal:  Negative for abdominal pain, diarrhea, nausea and vomiting.  Musculoskeletal:  Positive for myalgias.  Neurological:  Positive for dizziness.     Physical Exam Triage Vital Signs ED Triage Vitals  Encounter Vitals Group     BP 12/27/22 1502 109/69     Systolic BP Percentile --      Diastolic BP Percentile --      Pulse Rate 12/27/22 1502 81     Resp 12/27/22 1502 18     Temp 12/27/22 1502 99 F (37.2 C)     Temp Source 12/27/22 1502 Oral     SpO2 12/27/22 1502 99 %     Weight 12/27/22 1459 150 lb (68 kg)     Height 12/27/22 1459 5\' 7"  (1.702 m)     Head Circumference --      Peak Flow --      Pain Score 12/27/22 1456 0     Pain Loc --      Pain Education --      Exclude from Growth Chart --    No data found.  Updated Vital Signs BP 109/69 (BP Location: Left Arm)    Pulse 81   Temp 99 F (37.2 C) (Oral)   Resp 18   Ht 5\' 7"  (1.702 m)   Wt 150 lb (68 kg)   LMP 12/23/2022 (Exact Date)   SpO2 99%   BMI 23.49 kg/m   Visual Acuity Right Eye Distance:   Left Eye Distance:   Bilateral Distance:    Right Eye Near:   Left Eye Near:    Bilateral Near:     Physical Exam Vitals and nursing note reviewed.  Constitutional:      General: She is not in acute distress.    Appearance: Normal appearance. She is not ill-appearing.  HENT:     Head: Normocephalic and atraumatic.     Nose: Congestion present.     Mouth/Throat:     Mouth: Mucous membranes are moist.     Pharynx: No oropharyngeal exudate or posterior oropharyngeal erythema.  Eyes:     Conjunctiva/sclera: Conjunctivae normal.  Cardiovascular:     Rate and Rhythm: Normal rate and regular rhythm.     Heart sounds: Normal heart sounds. No murmur heard. Pulmonary:     Effort: Pulmonary effort is normal. No respiratory distress.     Breath sounds: Wheezing (rare, scattered) present. No rhonchi or rales.  Skin:    General: Skin is warm and dry.  Neurological:     Mental Status: She is alert.  Psychiatric:        Mood and Affect: Mood normal.        Thought Content: Thought content normal.      UC Treatments / Results  Labs (all labs ordered are listed, but only abnormal results are displayed) Labs Reviewed - No data to display  EKG   Radiology No results found.  Procedures Procedures (including critical care time)  Medications Ordered in UC Medications - No data to display  Initial Impression / Assessment and Plan / UC Course  I have reviewed the triage vital signs and the nursing notes.  Pertinent labs & imaging results that were available during my care of the patient were reviewed by me and considered in my medical decision making (see chart for details).    Chest x-ray without acute findings.  Suspect likely asthma exacerbation but given pneumonia exposure will  also treat with amoxicillin as well as Z-Pak and steroid burst.  Recommended follow-up if no gradual improvement or with any worsening symptoms.  Patient expresses understanding.  Final Clinical Impressions(s) / UC Diagnoses   Final diagnoses:  Asthma with acute exacerbation, unspecified asthma severity, unspecified whether persistent   Discharge Instructions   None    ED Prescriptions     Medication Sig Dispense Auth. Provider   amoxicillin (AMOXIL) 500 MG capsule Take 1 capsule (500 mg total) by mouth 3 (three) times daily. 21 capsule Erma Pinto F, PA-C   azithromycin (ZITHROMAX) 250 MG tablet Take 1 tablet (250 mg total) by mouth daily. Take first 2 tablets together, then 1 every day until finished. 6 tablet Erma Pinto F, PA-C   predniSONE (DELTASONE) 20 MG tablet Take 2 tablets (40 mg total) by mouth daily with breakfast for 5 days. 10 tablet Tomi Bamberger, PA-C      PDMP not reviewed this encounter.   Tomi Bamberger, PA-C 01/25/23 [phone]

## 2023-03-09 NOTE — Progress Notes (Signed)
 Chief Complaint: RUQ abdominal pain Primary GI Doctor: Dr. Legrand  HPI: 43 year old female patient that presents as a new patient with past medical history of asthma, depression, and hypothyroidism who is referred to me by Dr. Reena Duck on 10/16/2022 for a complaint of right upper quadrant abdominal pain.  Patient saw PCP on 09/17/2022 with main complaint of severe abdominal pain that has lasted for 1 year in right upper quadrant with radiation to flank. HIDA scan ordered which was normal.  Interval History:    Patient presents with main complaint of RUQ pain that started back in August 2023 causing her to go to ED for evaluation. At that time they ordered CT scan which was negative for acute process. Trop normal. She was referred to General Surgery who at that time did not recommend surgery. She states that there is always a dull ache with periods of stabbing sharp pain that radiate to her back and shoulder. Pain occurs intermittently, dull ache in between episodes which can occur every few weeks to a month apart. She will have pain with and without eating, no particular foods. Denies GERD or dysphagia. She does have episodes of noncardiac chest pain with anxiety. She has tried heating pad with no improvement. She has tried OTC antiacid without improvement. She was prescribed dicyclomine which made her drowsy and did not help the symptoms so she discontinued. Patient also tried taking OTC NSAID's which helps minimally. No blood in stool. No tarry stools. Occasional nausea with discomfort. Denies weight loss.  Patient has one bowel movement daily with BSS score of 2-3.     History of iron deficiency, chronic, and taking oral iron off and on. She has history of heavy menstrual cycle that lasts up to 5 days with heavy flow. She has not had recent follow-up with GYN/OB to address current issues.   On average she drinks 3 glasses of wine per day. Former smoker, quit 6 years ago. She has a lot of stress  at home with ill family members and 2 young children. 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 Patient is adopted, maternal grandmother had uterine CA. Wt Readings from Last 3 Encounters:  03/10/23 153 lb (69.4 kg)  12/27/22 150 lb (68 kg)  10/05/22 149 lb (67.6 kg)    Past Medical History:  Diagnosis Date   Adjustment disorder with mixed anxiety and depressed mood 12/17/2016   Anemia    Anemia affecting pregnancy, antepartum 12/17/2016   Asthma    Chorioamnionitis, delivered, current hospitalization 12/17/2016   Depression    Hx of cellulitis of skin with lymphangitis    from last CS   Hypothyroidism 12/17/2016   Thyroid  dysfunction in pregnancy in third trimester    Past Surgical History:  Procedure Laterality Date   CESAREAN SECTION N/A 12/16/2016   Procedure: CESAREAN SECTION;  Surgeon: Barbette Knock, MD;  Location: [facility] BIRTHING SUITES;  Service: Obstetrics;  Laterality: N/A;   CESAREAN SECTION WITH BILATERAL TUBAL LIGATION Bilateral 05/25/2018   Procedure: Repeat CESAREAN SECTION WITH BILATERAL TUBAL LIGATION;  Surgeon: Barbette Knock, MD;  Location: [facility] LD ORS;  Service: Obstetrics;  Laterality: Bilateral;  EDD: 05/22/18   EXTRACORPOREAL SHOCK WAVE LITHOTRIPSY Right 06/24/2019   Procedure: EXTRACORPOREAL SHOCK WAVE LITHOTRIPSY (ESWL);  Surgeon: Ottelin, Mark, MD;  Location: [facility];  Service: Urology;  Laterality: Right;  75 MINS   GANGLION CYST EXCISION Left 4/15   3 rd toe   HERNIA REPAIR Right 1991   IR  ANGIOGRAM PELVIS SELECTIVE OR SUPRASELECTIVE  05/28/2018   IR ANGIOGRAM SELECTIVE EACH ADDITIONAL VESSEL  05/28/2018   IR ANGIOGRAM SELECTIVE EACH ADDITIONAL VESSEL  05/28/2018   IR ANGIOGRAM SELECTIVE EACH ADDITIONAL VESSEL  05/28/2018   IR EMBO ART  VEN HEMORR LYMPH EXTRAV  INC GUIDE ROADMAPPING  05/28/2018   IR US  GUIDE VASC ACCESS RIGHT  05/28/2018   TEE WITHOUT CARDIOVERSION N/A 06/30/2019   Procedure: TRANSESOPHAGEAL ECHOCARDIOGRAM (TEE);  Surgeon:  Mona Vinie BROCKS, MD;  Location: [facility] ENDOSCOPY;  Service: Cardiovascular;  Laterality: N/A;   TONSILLECTOMY  1997   Current Outpatient Medications  Medication Sig Dispense Refill   albuterol  (PROVENTIL  HFA;VENTOLIN  HFA) 108 (90 BASE) MCG/ACT inhaler Inhale 2 puffs into the lungs every 6 (six) hours as needed for wheezing.     buPROPion (WELLBUTRIN) 100 MG tablet Take 100 mg by mouth 2 (two) times daily.     busPIRone  (BUSPAR ) 10 MG tablet Take 10 mg by mouth 2 (two) times daily.     cetirizine  (ZYRTEC ) 10 MG tablet Take 10 mg by mouth at bedtime.     ferrous sulfate 325 (65 FE) MG EC tablet Take 325 mg by mouth daily with breakfast.     levothyroxine  (SYNTHROID , LEVOTHROID) 75 MCG tablet Take 75 mcg by mouth every other day. Last taken: 75mcg.     sertraline  (ZOLOFT ) 100 MG tablet Take 100 mg by mouth at bedtime.     No current facility-administered medications for this visit.   Allergies as of 03/10/2023 - Review Complete 03/10/2023  Allergen Reaction Noted   Nitrofurantoin   12/27/2022   Keflex  [cephalexin ] Nausea And Vomiting 11/07/2022   Family History  Adopted: Yes  Problem Relation Age of Onset   Uterine cancer Maternal Grandmother    Review of Systems:    Constitutional: No weight loss, fever, chills, weakness or fatigue HEENT: Eyes: No change in vision               Ears, Nose, Throat:  No change in hearing or congestion Skin: No rash or itching Cardiovascular: No chest pain, chest pressure or palpitations   Respiratory: No SOB or cough Gastrointestinal: See HPI and otherwise negative Genitourinary: No dysuria or change in urinary frequency Neurological: No headache, dizziness or syncope Musculoskeletal: No new muscle or joint pain Hematologic: No bleeding or bruising Psychiatric: No history of depression or anxiety   Physical Exam:  Vital signs: BP 116/80   Pulse 93   Ht 5' 7 (1.702 m)   Wt 153 lb (69.4 kg)   BMI 23.96 kg/m   Constitutional: Pleasant Caucasian  female appears to be in NAD, Well developed, Well nourished, alert and cooperative Neck:  Supple Throat: Oral cavity and pharynx without inflammation, swelling or lesion.  Respiratory: Respirations even and unlabored. Lungs clear to auscultation bilaterally.   No wheezes, crackles, or rhonchi.  Cardiovascular: Normal S1, S2. Regular rate and rhythm. No peripheral edema, cyanosis or pallor.  Gastrointestinal:  Soft, nondistended, tenderness generalized with palpation. No rebound or guarding. Normal bowel sounds. No appreciable masses or hepatomegaly. Rectal:  Not performed.  Skin:   Dry and intact without significant lesions or rashes. Psychiatric: Oriented to person, place and time. Demonstrates good judgement and reason without abnormal affect or behaviors.  RELEVANT LABS AND IMAGING: CBC    Latest Ref Rng & Units 10/05/2022    4:57 Lara 10/12/2021    9:03 Lara 06/30/2019    5:34 AM  CBC  WBC 4.0 - 10.5 K/uL 8.2  7.0  8.4   Hemoglobin 12.0 - 15.0 g/dL 86.4  87.2  89.8   Hematocrit 36.0 - 46.0 % 42.0  38.8  32.3   Platelets 150 - 400 K/uL 351  358  353     09/17/2022 labs show: Lipase 17, TSH 4.06, BUN 14/creatinine 0.64, normal LFTs, WBC 6.8, hemoglobin 12.8, platelets 277  CMP     Latest Ref Rng & Units 10/05/2022    4:57 Lara 10/12/2021    9:03 Lara 06/06/2021    4:02 Lara  CMP  Glucose 70 - 99 mg/dL 89  88  94   BUN 6 - 20 mg/dL 13  15  9    Creatinine 0.44 - 1.00 mg/dL 9.21  9.28  9.29   Sodium 135 - 145 mmol/L 137  138  138   Potassium 3.5 - 5.1 mmol/L 3.7  3.9  3.6   Chloride 98 - 111 mmol/L 103  103  107   CO2 22 - 32 mmol/L 20  23  24    Calcium 8.9 - 10.3 mg/dL 9.6  9.6  9.1   Total Protein 6.5 - 8.1 g/dL  7.6    Total Bilirubin 0.3 - 1.2 mg/dL  1.0    Alkaline Phos 38 - 126 U/L  38    AST 15 - 41 U/L  20    ALT 0 - 44 U/L  15      Lab Results  Component Value Date   LIPASE <10 (L) 10/12/2021    06/30/2019 echo-Left ventricular ejection fraction, by estimation, is 60 to 65%.    10/11/2022 NM Hepato w/ef IMPRESSION: 1. No CBD or cystic duct obstruction. 2. Gallbladder ejection fraction was normal.  11/14/2021 abdominal ultrasound limited right upper quadrant IMPRESSION: No cholelithiasis or sonographic evidence for acute cholecystitis.  10/13/2021 CT abdomen pelvis with contrast  IMPRESSION: No acute abnormality in the abdomen and pelvis.  Assessment: Encounter Diagnoses  Name Primary?   Abdominal pain, right upper quadrant Yes   Chronic constipation    Atypical chest pain   43 year old female patient that presents with chronic RUQ abdominal pain/chest pain with negative imaging (CT scan, Abd U/S, and HIDA) and unremarkable lab work not relieved with OTC antiacid therapy or antispasmodics. Discussed with Dr. Legrand and will go ahead and proceed with upper GI endoscopy to rule out gastritis or PUD. If workup negative I suspect it Megan Lara be functional in nature. She does also have some mild constipation and we discussed increasing daily fiber intake and fluid intake.   Plan: -Start Citrucel 2tsp po daily -Recommend GERD diet, no late meals -Recommend increase fluid intake, incorporate more fiber - Recommend she see OB/GYN for follow-up on those issues discussed -Megan Lara consider anticholinergic hyoscyamine  if workup negative. -Schedule EGD in [facility] with Dr. Legrand Megan Lara, Megan Lara Megan Lara, Megan Lara  Cc: Megan Mems, MD

## 2023-03-10 ENCOUNTER — Encounter: Payer: Self-pay | Admitting: Gastroenterology

## 2023-03-10 ENCOUNTER — Ambulatory Visit: Payer: 59 | Admitting: Gastroenterology

## 2023-03-10 VITALS — BP 116/80 | HR 93 | Ht 67.0 in | Wt 153.0 lb

## 2023-03-10 DIAGNOSIS — K5909 Other constipation: Secondary | ICD-10-CM

## 2023-03-10 DIAGNOSIS — R0789 Other chest pain: Secondary | ICD-10-CM | POA: Diagnosis not present

## 2023-03-10 DIAGNOSIS — R1011 Right upper quadrant pain: Secondary | ICD-10-CM

## 2023-03-10 NOTE — Patient Instructions (Addendum)
 Start Citrucel 2 teaspoons in 8 ounces of liquid daily.  Increase fluid in take.  Start a high fiber diet.   You have been scheduled for an endoscopy. Please follow written instructions given to you at your visit today.  If you use inhalers (even only as needed), please bring them with you on the day of your procedure.  If you take any of the following medications, they will need to be adjusted prior to your procedure:   DO NOT TAKE 7 DAYS PRIOR TO TEST- Trulicity (dulaglutide) Ozempic, Wegovy (semaglutide) Mounjaro (tirzepatide) Bydureon Bcise (exanatide extended release)  DO NOT TAKE 1 DAY PRIOR TO YOUR TEST Rybelsus (semaglutide) Adlyxin (lixisenatide) Victoza (liraglutide) Byetta (exanatide) __________________________________________________________________  _______________________________________________________  If your blood pressure at your visit was 140/90 or greater, please contact your primary care physician to follow up on this.  _______________________________________________________  If you are age 50 or older, your body mass index should be between 23-30. Your Body mass index is 23.96 kg/m. If this is out of the aforementioned range listed, please consider follow up with your Primary Care Provider.  If you are age 63 or younger, your body mass index should be between 19-25. Your Body mass index is 23.96 kg/m. If this is out of the aformentioned range listed, please consider follow up with your Primary Care Provider.   ________________________________________________________  The [facility] providers would like to encourage you to use MYCHART to communicate with providers for non-urgent requests or questions.  Due to long hold times on the telephone, sending your provider a message by [facility] may be a faster and more efficient way to get a response.  Please allow 48 business hours for a response.  Please remember that this is for non-urgent requests.   _______________________________________________________

## 2023-03-11 ENCOUNTER — Encounter: Payer: Self-pay | Admitting: Gastroenterology

## 2023-03-11 NOTE — Progress Notes (Signed)
 ____________________________________________________________  Attending physician addendum:  Thank you for sending this case to me. I have reviewed the entire note and agree with the plan.  While there are elements that sound like biliary colic, cholecystectomy would be of uncertain benefit with the negative testing as outlined.  Victory Brand, MD  ____________________________________________________________

## 2023-03-13 ENCOUNTER — Encounter: Payer: Self-pay | Admitting: Certified Registered Nurse Anesthetist

## 2023-03-14 ENCOUNTER — Ambulatory Visit (AMBULATORY_SURGERY_CENTER): Payer: 59 | Admitting: Gastroenterology

## 2023-03-14 ENCOUNTER — Other Ambulatory Visit: Payer: Self-pay

## 2023-03-14 ENCOUNTER — Other Ambulatory Visit (INDEPENDENT_AMBULATORY_CARE_PROVIDER_SITE_OTHER): Payer: 59

## 2023-03-14 ENCOUNTER — Other Ambulatory Visit: Payer: Self-pay | Admitting: Gastroenterology

## 2023-03-14 ENCOUNTER — Encounter: Payer: Self-pay | Admitting: Gastroenterology

## 2023-03-14 VITALS — BP 130/87 | HR 73 | Temp 97.3°F | Resp 11 | Ht 67.0 in | Wt 153.0 lb

## 2023-03-14 DIAGNOSIS — R0789 Other chest pain: Secondary | ICD-10-CM | POA: Diagnosis not present

## 2023-03-14 DIAGNOSIS — R1011 Right upper quadrant pain: Secondary | ICD-10-CM

## 2023-03-14 LAB — HEPATIC FUNCTION PANEL
ALT: 12 U/L (ref 0–35)
AST: 14 U/L (ref 0–37)
Albumin: 4.6 g/dL (ref 3.5–5.2)
Alkaline Phosphatase: 42 U/L (ref 39–117)
Bilirubin, Direct: 0.2 mg/dL (ref 0.0–0.3)
Total Bilirubin: 1.4 mg/dL — ABNORMAL HIGH (ref 0.2–1.2)
Total Protein: 7.2 g/dL (ref 6.0–8.3)

## 2023-03-14 MED ORDER — HYOSCYAMINE SULFATE 0.125 MG SL SUBL: 0.1250 mg | SUBLINGUAL_TABLET | Freq: Four times a day (QID) | SUBLINGUAL | 2 refills | Status: DC | PRN

## 2023-03-14 MED ORDER — SODIUM CHLORIDE 0.9 % IV SOLN: 500.0000 mL | INTRAVENOUS | Status: DC

## 2023-03-14 NOTE — Progress Notes (Signed)
 Called to room to assist during endoscopic procedure.  Patient ID and intended procedure confirmed with present staff. Received instructions for my participation in the procedure from the performing physician.

## 2023-03-14 NOTE — Progress Notes (Signed)
 No significant changes to clinical history since GI office visit on 03/10/23.  The patient is appropriate for an endoscopic procedure in the ambulatory setting.  - Amada Jupiter, MD

## 2023-03-14 NOTE — Op Note (Signed)
 [facility] Patient Name: Megan Lara Procedure Date: 03/14/2023 9:53 AM MRN: [id number] Endoscopist: Victory L. Legrand , MD, [id number] Age: 43 Referring MD:  Date of Birth: 12/17/1980 Gender: Female Account #: [id number] Procedure:                Upper GI endoscopy Indications:              Abdominal pain in the right upper quadrant Medicines:                Monitored Anesthesia Care Procedure:                Pre-Anesthesia Assessment:                           - Prior to the procedure, a History and Physical                            was performed, and patient medications and                            allergies were reviewed. The patient's tolerance of                            previous anesthesia was also reviewed. The risks                            and benefits of the procedure and the sedation                            options and risks were discussed with the patient.                            All questions were answered, and informed consent                            was obtained. Prior Anticoagulants: The patient has                            taken no anticoagulant or antiplatelet agents. ASA                            Grade Assessment: II - A patient with mild systemic                            disease. After reviewing the risks and benefits,                            the patient was deemed in satisfactory condition to                            undergo the procedure.                           After obtaining informed consent, the endoscope was  passed under direct vision. Throughout the                            procedure, the patient's blood pressure, pulse, and                            oxygen saturations were monitored continuously. The                            Olympus Scope [id number] was introduced through the                            mouth, and advanced to the second part of duodenum.                             The upper GI endoscopy was accomplished without                            difficulty. The patient tolerated the procedure                            well. Scope In: Scope Out: Findings:                 The esophagus was normal.                           Normal mucosa was found in the entire examined                            stomach. Biopsies were taken with a cold forceps                            for histology. (antrum and body in one jar to r/o                            Hy pylori)                           The cardia and gastric fundus were normal on                            retroflexion.                           The examined duodenum was normal. Complications:            No immediate complications. Estimated Blood Loss:     Estimated blood loss was minimal. Impression:               - Normal esophagus.                           - Normal mucosa was found in the entire stomach.                            Biopsied.                           -  Normal examined duodenum. Recommendation:           - Patient has a contact number available for                            emergencies. The signs and symptoms of potential                            delayed complications were discussed with the                            patient. Return to normal activities tomorrow.                            Written discharge instructions were provided to the                            patient.                           - Resume previous diet.                           - Continue present medications.                           - Await pathology results.                           Symptoms had been occurring for about 18 months and                            are escalating in recent months. Her description of                            right upper quadrant pain typically radiating into                            the back and upper chest sounds typical for biliary                            cause.                            September 2023 CT abdomen and pelvis and right                            upper quadrant ultrasound with no gallstones. LFTs                            normal at that time (no repeat since then). Did not                            tolerate trial of antispasmodic therapy.  Recent CCK?"HIDA scan normal.Saw surgical consultant                            2023 prior to ultrasound.                           Even if these gastric biopsies are positive for H.                            pylori (which will be treated), it would seem a                            doubtful because of the intense pain she is                            describing.                           I recommend LFTs today and a repeat right upper                            quadrant ultrasound in the near future as well as a                            reevaluation by the surgeon [employer]). Jonise Weightman L. Legrand, MD 03/14/2023 10:19:54 AM This report has been signed electronically.

## 2023-03-14 NOTE — Progress Notes (Signed)
   Established Patient Office Visit  Subjective   Patient ID: Megan Lara, female    DOB: 1980/06/15  Age: 43 y.o. MRN: [id number]  Chief Complaint  Patient presents with   EGD    R10.11 RUQ ABD pain Verena, MD    HPI    ROS    Objective:     BP (!) 143/94   Pulse 82   Temp (!) 97.3 F (36.3 C) (Temporal)   Resp 19   Ht 5' 7 (1.702 m)   Wt 153 lb (69.4 kg)   SpO2 100%   BMI 23.96 kg/m    Physical Exam   No results found for any visits on 03/14/23.    The ASCVD Risk score (Arnett DK, et al., 2019) failed to calculate for the following reasons:   Cannot find a previous HDL lab   Cannot find a previous total cholesterol lab    Assessment & Plan:   Problem List Items Addressed This Visit   None Visit Diagnoses       Abdominal pain, right upper quadrant    -  Primary   Relevant Medications   0.9 %  sodium chloride  infusion   Other Relevant Orders   Diet NPO   Hypoglycemia/Hyperglycemia protocol   Emergency Medications   ECG and pulse monitoring    Monitor NIBP   Notify physician   Monitor O2 SATs   NPO status   Vital signs   Monitor NIBP, ECG and PSA02   Notify physician   Discharge home with responsible adult.   Discharge instructions   Esophageal dilation: verify informed consent   Esophagogastroduodenoscopy: verify informed consent       No follow-ups on file.    Josette Ranger, RN

## 2023-03-14 NOTE — Progress Notes (Signed)
 Report given to PACU, vss

## 2023-03-14 NOTE — Progress Notes (Signed)
 Pt's states no medical or surgical changes since previsit or office visit.

## 2023-03-14 NOTE — Progress Notes (Signed)
0955 Robinul 0.1 mg IV given due large amount of secretions upon assessment.  MD made aware, vss

## 2023-03-14 NOTE — Patient Instructions (Signed)
       Await biopsy results   Continue previous diet & medications    YOU HAD AN ENDOSCOPIC PROCEDURE TODAY AT THE [facility]:   Refer to the procedure report that was given to you for any specific questions about what was found during the examination.  If the procedure report does not answer your questions, please call your gastroenterologist to clarify.  If you requested that your care partner not be given the details of your procedure findings, then the procedure report has been included in a sealed envelope for you to review at your convenience later.  YOU SHOULD EXPECT: Some feelings of bloating in the abdomen. Passage of more gas than usual.  Walking can help get rid of the air that was put into your GI tract during the procedure and reduce the bloating. If you had a lower endoscopy (such as a colonoscopy or flexible sigmoidoscopy) you may notice spotting of blood in your stool or on the toilet paper. If you underwent a bowel prep for your procedure, you may not have a normal bowel movement for a few days.  Please Note:  You might notice some irritation and congestion in your nose or some drainage.  This is from the oxygen used during your procedure.  There is no need for concern and it should clear up in a day or so.  SYMPTOMS TO REPORT IMMEDIATELY:   Following upper endoscopy (EGD)  Vomiting of blood or coffee ground material  New chest pain or pain under the shoulder blades  Painful or persistently difficult swallowing  New shortness of breath  Fever of 100F or higher  Black, tarry-looking stools  For urgent or emergent issues, a gastroenterologist can be reached at any hour by calling [phone]. Do not use MyChart messaging for urgent concerns.    DIET:  We do recommend a small meal at first, but then you may proceed to your regular diet.  Drink plenty of fluids but you should avoid alcoholic beverages for 24 hours.  ACTIVITY:  You should plan to take  it easy for the rest of today and you should NOT DRIVE or use heavy machinery until tomorrow (because of the sedation medicines used during the test).    FOLLOW UP: Our staff will call the number listed on your records the next business day following your procedure.  We will call around 7:15- 8:00 am to check on you and address any questions or concerns that you may have regarding the information given to you following your procedure. If we do not reach you, we will leave a message.     If any biopsies were taken you will be contacted by phone or by letter within the next 1-3 weeks.  Please call us  at [phone] if you have not heard about the biopsies in 3 weeks.    SIGNATURES/CONFIDENTIALITY: You and/or your care partner have signed paperwork which will be entered into your electronic medical record.  These signatures attest to the fact that that the information above on your After Visit Summary has been reviewed and is understood.  Full responsibility of the confidentiality of this discharge information lies with you and/or your care-partner.

## 2023-03-17 ENCOUNTER — Telehealth: Payer: Self-pay | Admitting: *Deleted

## 2023-03-17 NOTE — Telephone Encounter (Signed)
  Follow up Call-     03/14/2023    9:34 AM  Call back number  Post procedure Call Back phone  # [phone]  Permission to leave phone message Yes   [facility]

## 2023-03-18 ENCOUNTER — Emergency Department (HOSPITAL_COMMUNITY): Payer: 59

## 2023-03-18 ENCOUNTER — Other Ambulatory Visit: Payer: Self-pay

## 2023-03-18 ENCOUNTER — Encounter (HOSPITAL_COMMUNITY): Payer: Self-pay

## 2023-03-18 ENCOUNTER — Emergency Department (HOSPITAL_COMMUNITY)
Admission: EM | Admit: 2023-03-18 | Discharge: 2023-03-19 | Disposition: A | Discharge status: Home / Self Care | Payer: 59 | Attending: Emergency Medicine | Admitting: Emergency Medicine

## 2023-03-18 DIAGNOSIS — R112 Nausea with vomiting, unspecified: Secondary | ICD-10-CM | POA: Diagnosis not present

## 2023-03-18 DIAGNOSIS — R1011 Right upper quadrant pain: Secondary | ICD-10-CM | POA: Insufficient documentation

## 2023-03-18 LAB — URINALYSIS, W/ REFLEX TO CULTURE (INFECTION SUSPECTED)
Bacteria, UA: NONE SEEN
Bilirubin Urine: NEGATIVE
Glucose, UA: NEGATIVE mg/dL
Ketones, ur: NEGATIVE mg/dL
Leukocytes,Ua: NEGATIVE
Nitrite: NEGATIVE
Protein, ur: NEGATIVE mg/dL
RBC / HPF: >50 RBC/hpf (ref 0–5)
Specific Gravity, Urine: 1.008 (ref 1.005–1.030)
pH: 7 (ref 5.0–8.0)

## 2023-03-18 LAB — CBC WITH DIFFERENTIAL/PLATELET
Abs Immature Granulocytes: 0.02 10*3/uL (ref 0.00–0.07)
Basophils Absolute: 0 10*3/uL (ref 0.0–0.1)
Basophils Relative: 1 %
Eosinophils Absolute: 0.2 10*3/uL (ref 0.0–0.5)
Eosinophils Relative: 2 %
HCT: 40.6 % (ref 36.0–46.0)
Hemoglobin: 13 g/dL (ref 12.0–15.0)
Immature Granulocytes: 0 %
Lymphocytes Relative: 25 %
Lymphs Abs: 2.2 10*3/uL (ref 0.7–4.0)
MCH: 30.8 pg (ref 26.0–34.0)
MCHC: 32 g/dL (ref 30.0–36.0)
MCV: 96.2 fL (ref 80.0–100.0)
Monocytes Absolute: 0.8 10*3/uL (ref 0.1–1.0)
Monocytes Relative: 9 %
Neutro Abs: 5.5 10*3/uL (ref 1.7–7.7)
Neutrophils Relative %: 63 %
Platelets: 304 10*3/uL (ref 150–400)
RBC: 4.22 MIL/uL (ref 3.87–5.11)
RDW: 12.4 % (ref 11.5–15.5)
WBC: 8.7 10*3/uL (ref 4.0–10.5)
nRBC: 0 % (ref 0.0–0.2)

## 2023-03-18 LAB — SURGICAL PATHOLOGY

## 2023-03-18 MED ORDER — ONDANSETRON 4 MG PO TBDP
4.0000 mg | ORAL_TABLET | Freq: Once | ORAL | Status: AC
Start: 2023-03-18 — End: 2023-03-19
  Administered 2023-03-19: 4 mg via ORAL
  Filled 2023-03-18: qty 1

## 2023-03-18 MED ORDER — HYDROCODONE-ACETAMINOPHEN 5-325 MG PO TABS
1.0000 | ORAL_TABLET | Freq: Once | ORAL | Status: AC
  Administered 2023-03-19: 1 via ORAL
  Filled 2023-03-18: qty 1

## 2023-03-18 NOTE — ED Triage Notes (Signed)
 Intermittent episodes of RUQ abdominal pain ~18 months.   Tonight ~630PM pain suddenly intensified and became unbearable.   Says she has been seen for gallbladder related issues recently. She was supposed to have US  completed this past Thursday but was unable to make it.

## 2023-03-18 NOTE — ED Provider Triage Note (Signed)
 Emergency Medicine Provider Triage Evaluation Note  Megan Lara , a 43 y.o. female  was evaluated in triage.  Pt complains of here for abdominal pain.  Has been ongoing over 18 months, intermittent in nature.  She is followed with [facility].  Had a HIDA scan which was normal.  States she has had CT scans and ultrasound have not had any significant abnormality.  She has also had endoscopies, most recent last week.  No bloody stool.  Pain now is going into the bottom portion of her chest.  Abdominal pain over the last 18 months has been primarily to the right upper quadrant, epigastric given to her right shoulder blade.  No shortness of breath, cough, fever.  She has had some nausea that vomiting.  No numbness or weakness.  No back pain.  No chronic NSAID use, EtOH use  Review of Systems  Positive: Abd pain, nausea Negative:   Physical Exam  BP 119/66   Pulse 77   Temp 98.4 F (36.9 C) (Oral)   Resp 16   Ht 5' 7 (1.702 m)   Wt 69.4 kg   SpO2 99%   BMI 23.96 kg/m  Gen:   Awake, no distress   Resp:  Normal effort  MSK:   Moves extremities without difficulty  Other:    Medical Decision Making  Medically screening exam initiated at 10:47 PM.  Appropriate orders placed.  Luster Nat Loge was informed that the remainder of the evaluation will be completed by another provider, this initial triage assessment does not replace that evaluation, and the importance of remaining in the ED until their evaluation is complete.  Abd pain   Korynn Kenedy A, PA-C 03/18/23 2248

## 2023-03-19 ENCOUNTER — Encounter: Payer: Self-pay | Admitting: Gastroenterology

## 2023-03-19 ENCOUNTER — Emergency Department (HOSPITAL_COMMUNITY): Payer: 59

## 2023-03-19 ENCOUNTER — Telehealth: Payer: Self-pay

## 2023-03-19 LAB — COMPREHENSIVE METABOLIC PANEL
ALT: 18 U/L (ref 0–44)
AST: 18 U/L (ref 15–41)
Albumin: 4.5 g/dL (ref 3.5–5.0)
Alkaline Phosphatase: 40 U/L (ref 38–126)
Anion gap: 9 (ref 5–15)
BUN: 10 mg/dL (ref 6–20)
CO2: 22 mmol/L (ref 22–32)
Calcium: 9.3 mg/dL (ref 8.9–10.3)
Chloride: 105 mmol/L (ref 98–111)
Creatinine, Ser: 0.66 mg/dL (ref 0.44–1.00)
GFR, Estimated: >60 mL/min (ref >60)
Glucose, Bld: 95 mg/dL (ref 70–99)
Potassium: 4.1 mmol/L (ref 3.5–5.1)
Sodium: 136 mmol/L (ref 135–145)
Total Bilirubin: 0.9 mg/dL (ref 0.0–1.2)
Total Protein: 7.7 g/dL (ref 6.5–8.1)

## 2023-03-19 LAB — HCG, SERUM, QUALITATIVE: Preg, Serum: NEGATIVE

## 2023-03-19 LAB — TROPONIN I (HIGH SENSITIVITY)
Troponin I (High Sensitivity): <2 ng/L (ref <18)
Troponin I (High Sensitivity): <2 ng/L (ref <18)

## 2023-03-19 LAB — LIPASE, BLOOD: Lipase: 31 U/L (ref 11–51)

## 2023-03-19 MED ORDER — MORPHINE SULFATE (PF) 4 MG/ML IV SOLN
4.0000 mg | Freq: Once | INTRAVENOUS | Status: AC
  Administered 2023-03-19: 4 mg via INTRAVENOUS
  Filled 2023-03-19: qty 1

## 2023-03-19 MED ORDER — ONDANSETRON HCL 4 MG/2ML IJ SOLN
4.0000 mg | Freq: Once | INTRAMUSCULAR | Status: AC
  Administered 2023-03-19: 4 mg via INTRAVENOUS
  Filled 2023-03-19: qty 2

## 2023-03-19 MED ORDER — POLYETHYLENE GLYCOL 3350 17 GM/SCOOP PO POWD: 17.0000 g | Freq: Every day | ORAL | 0 refills | Status: DC

## 2023-03-19 NOTE — Telephone Encounter (Signed)
 Dr. Dominic Friendly, we ordered RUQ US  with liver doppler for patient. Looks like she went to ED yesterday for RUQ pain and they performed US  with doppler today. Will d/c our order. Just wanted to let you know. Thanks

## 2023-03-19 NOTE — ED Provider Notes (Signed)
 [place] EMERGENCY DEPARTMENT AT [facility] Provider Note   CSN: [id number] Arrival date & time: 03/18/23  2217     History  Chief Complaint  Patient presents with   Abdominal Pain    Megan Lara is a 43 y.o. female here for evaluation of right upper quadrant abdominal pain into her right scapula.  This has been ongoing for over a year.  She is followed by [facility].  She states she has had ultrasounds, CTs, HIDA scans as well as endoscopies without significant findings.  Yesterday evening pain became more severe which led her here to the emergency department.  Had some nausea that vomiting.  No fever.  Last week had upper endoscopy performed.  She denies any history of GERD.  Symptoms not worse with exertion or food intake.  She can go weeks to months between episodes.  Has tried antacid without relief as well as heat.  No pain or swelling to lower legs.  Pain today feels similar to the pain that she typically has with her flares.  HPI     Home Medications Prior to Admission medications   Medication Sig Start Date End Date Taking? Authorizing Provider  albuterol  (PROVENTIL  HFA;VENTOLIN  HFA) 108 (90 BASE) MCG/ACT inhaler Inhale 2 puffs into the lungs every 6 (six) hours as needed for wheezing.    [provider]  buPROPion (WELLBUTRIN) 100 MG tablet Take 100 mg by mouth 2 (two) times daily. Patient not taking: Reported on 03/14/2023 10/16/15   [provider]  busPIRone  (BUSPAR ) 10 MG tablet Take 10 mg by mouth 2 (two) times daily.    [provider]  cetirizine  (ZYRTEC ) 10 MG tablet Take 10 mg by mouth at bedtime.    [provider]  ferrous sulfate 325 (65 FE) MG EC tablet Take 325 mg by mouth daily with breakfast.    [provider]  hyoscyamine  (LEVSIN SL) 0.125 MG SL tablet Place 1 tablet (0.125 mg total) under the tongue every 6 (six) hours as needed. 03/14/23   Albertina Hugger, MD  levothyroxine  (SYNTHROID ,  LEVOTHROID) 75 MCG tablet Take 75 mcg by mouth every other day. Last taken: 75mcg.    [provider]  sertraline  (ZOLOFT ) 100 MG tablet Take 100 mg by mouth at bedtime.    [provider]      Allergies    Keflex  [cephalexin ] and Nitrofurantoin     Review of Systems   Review of Systems  Constitutional: Negative.   HENT: Negative.    Respiratory: Negative.    Cardiovascular: Negative.   Gastrointestinal:  Positive for abdominal pain and nausea. Negative for abdominal distention, anal bleeding, blood in stool, constipation, diarrhea, rectal pain and vomiting.  Genitourinary: Negative.   Musculoskeletal: Negative.   Skin: Negative.   Neurological: Negative.   All other systems reviewed and are negative.  Physical Exam Updated Vital Signs BP 105/61 (BP Location: Right Arm)   Pulse (!) 55   Temp 98.1 F (36.7 C) (Oral)   Resp 18   Ht 5\' 7"  (1.702 m)   Wt 69.4 kg   LMP 03/18/2023   SpO2 98%   BMI 23.96 kg/m  Physical Exam Vitals and nursing note reviewed.  Constitutional:      General: She is not in acute distress.    Appearance: She is well-developed. She is not ill-appearing.  HENT:     Head: Atraumatic.  Eyes:     Pupils: Pupils are equal, round, and reactive to  light.  Cardiovascular:     Rate and Rhythm: Normal rate.     Heart sounds: Normal heart sounds.  Pulmonary:     Effort: Pulmonary effort is normal. No respiratory distress.     Breath sounds: Normal breath sounds.  Abdominal:     General: Bowel sounds are normal. There is no distension.     Palpations: Abdomen is soft.     Tenderness: There is abdominal tenderness in the right upper quadrant and epigastric area. There is no right CVA tenderness, left CVA tenderness, guarding or rebound. Negative signs include Murphy's sign and McBurney's sign.  Musculoskeletal:        General: Normal range of motion.     Cervical back: Normal range of motion.  Skin:    General: Skin is warm and dry.   Neurological:     General: No focal deficit present.     Mental Status: She is alert.  Psychiatric:        Mood and Affect: Mood normal.    ED Results / Procedures / Treatments   Labs (all labs ordered are listed, but only abnormal results are displayed) Labs Reviewed  URINALYSIS, W/ REFLEX TO CULTURE (INFECTION SUSPECTED) - Abnormal; Notable for the following components:      Result Value   Color, Urine STRAW (*)    Hgb urine dipstick LARGE (*)    All other components within normal limits  LIPASE, BLOOD  COMPREHENSIVE METABOLIC PANEL  HCG, SERUM, QUALITATIVE  CBC WITH DIFFERENTIAL/PLATELET  TROPONIN I (HIGH SENSITIVITY)  TROPONIN I (HIGH SENSITIVITY)    EKG None  Radiology DG Abdomen Acute W/Chest Result Date: 03/18/2023 CLINICAL DATA:  Abdominal pain.  Chest pain.  Recent endoscopy. EXAM: DG ABDOMEN ACUTE WITH 1 VIEW CHEST COMPARISON:  CT 10/13/2021 FINDINGS: The cardiomediastinal contours are normal. The lungs are clear. No evidence of pneumomediastinum. There is no free intra-abdominal air. No dilated bowel loops to suggest obstruction. Moderate volume of stool throughout the colon. No radiopaque calculi. Coils in the left abdomen. No acute osseous abnormalities are seen. IMPRESSION: 1. No free intra-abdominal air or pneumomediastinum. 2. Moderate colonic stool burden. Electronically Signed   By: Chadwick Colonel M.D.   On: 03/18/2023 23:28    Procedures Procedures    Medications Ordered in ED Medications  HYDROcodone -acetaminophen  (NORCO/VICODIN) 5-325 MG per tablet 1 tablet (1 tablet Oral Given 03/19/23 0010)  ondansetron  (ZOFRAN -ODT) disintegrating tablet 4 mg (4 mg Oral Given 03/19/23 0010)  morphine  (PF) 4 MG/ML injection 4 mg (4 mg Intravenous Given 03/19/23 0559)  ondansetron  (ZOFRAN ) injection 4 mg (4 mg Intravenous Given 03/19/23 0558)   ED Course/ Medical Decision Making/ A&P   43 year old here for evaluation of right upper quadrant pain into her right  scapula.  She has had intermittent ongoing pain over the last 18 months or so.  She is followed with [facility].  Has had ultrasounds, CT scans as well as HIDA scans and most recently 5 days ago and endoscopy which did not show any significant abnormality.  He is afebrile, nonseptic, not ill-appearing.  Does occasionally get some epigastric pain into her lower chest however denies any shortness of breath.  She is PERC negative, Wells criteria low risk.  No blood in her stool.  Occasionally drinks alcohol.  No NSAIDs.  She states she is post to have repeat right upper quadrant ultrasound liver Dopplers in 2 days.  Her pain became more severe today leading her here today for evaluation.  Will plan on  labs, imaging, will get ultrasound while she is here.  Labs and imaging personally viewed and interpreted:  CBC without leukocytosis Metabolic panel without significant abnormality Lipase 31 Troponin less than 2 UA negative for infection X-ray chest abdomen with no free air-does show moderate stool burden  Patient reassessed.  Will give additional medication.  It looks like GI wanted right upper quadrant ultrasound with liver Doppler.  Will order while she is here.  Will give IV pain management  No cough, shortness of breath to suggest pneumonia, pneumothorax as cause of right upper quadrant symptoms.  She is PERC negative, low suspicion for PE.  She has no urinary symptoms, flank pain to suggest stone, pyelonephritis.  Care transferred to Tran, PA-C who will FU on imaging and disposition.                                 Medical Decision Making Amount and/or Complexity of Data Reviewed Independent Historian: friend External Data Reviewed: labs, radiology, ECG and notes. Labs: ordered. Decision-making details documented in ED Course. Radiology: ordered and independent interpretation performed. Decision-making details documented in ED Course. ECG/medicine tests: ordered and independent  interpretation performed. Decision-making details documented in ED Course.  Risk OTC drugs. Prescription drug management. Parenteral controlled substances. Decision regarding hospitalization. Diagnosis or treatment significantly limited by social determinants of health.         Final Clinical Impression(s) / ED Diagnoses Final diagnoses:  RUQ abdominal pain    Rx / DC Orders ED Discharge Orders     None         Mandolin Falwell A, PA-C 03/19/23 1610    Kelsey Patricia, MD 03/19/23 [phone]

## 2023-03-19 NOTE — ED Provider Notes (Signed)
 Received signout from previous provider, please see her note for complete H&P.  This is a 43 year old female presenting with complaints of pain to her right upper abdomen which radiates to her right shoulder.  This is an ongoing issue for more than a year and she does follow-up with [facility].  She has had extensive workup in the past including CT scans, ultrasound, and HIDA scan as well as upper endoscopy without definitive finding.  She is here with worsening of her symptoms.  Workup overall reassuring, normal troponin, urinalysis without signs of UTI, normal lipase, electrolyte panels are reassuring, pregnancy test is negative, white count is normal.  Limited abdominal ultrasound with liver Doppler was obtained independent viewed interpreted by me and overall without any acute finding.  An acute abdominal x-ray was also obtained showing no evidence of free air but patient does have moderate colonic stool burden.  I discussed finding with patient.  I recommend increasing fiber intake and follow-up closely with her GI specialist for further outpatient evaluation.  Please note patient has had fairly soft blood pressure during this visit.  Her current blood pressure is 97/55 but she is not tachycardic and she states baseline blood pressure is usually low 100s systolic.  She does not appear to be symptomatic from the standpoint.  She can tolerate p.o.  Encouraged increased p.o. intake.  Otherwise patient stable for discharge.  BP (!) 97/55 (BP Location: Right Arm)   Pulse 72   Temp 97.6 F (36.4 C) (Oral)   Resp 18   Ht 5\' 7"  (1.702 m)   Wt 69.4 kg   LMP 03/18/2023   SpO2 100%   BMI 23.96 kg/m   Results for orders placed or performed during the hospital encounter of 03/18/23  Lipase, blood   Collection Time: 03/18/23 11:09 PM  Result Value Ref Range   Lipase 31 11 - 51 U/L  Comprehensive metabolic panel   Collection Time: 03/18/23 11:09 PM  Result Value Ref Range   Sodium 136  135 - 145 mmol/L   Potassium 4.1 3.5 - 5.1 mmol/L   Chloride 105 98 - 111 mmol/L   CO2 22 22 - 32 mmol/L   Glucose, Bld 95 70 - 99 mg/dL   BUN 10 6 - 20 mg/dL   Creatinine, Ser 8.41 0.44 - 1.00 mg/dL   Calcium 9.3 8.9 - 32.4 mg/dL   Total Protein 7.7 6.5 - 8.1 g/dL   Albumin 4.5 3.5 - 5.0 g/dL   AST 18 15 - 41 U/L   ALT 18 0 - 44 U/L   Alkaline Phosphatase 40 38 - 126 U/L   Total Bilirubin 0.9 0.0 - 1.2 mg/dL   GFR, Estimated >40 >10 mL/min   Anion gap 9 5 - 15  hCG, serum, qualitative   Collection Time: 03/18/23 11:09 PM  Result Value Ref Range   Preg, Serum NEGATIVE NEGATIVE  CBC with Differential   Collection Time: 03/18/23 11:09 PM  Result Value Ref Range   WBC 8.7 4.0 - 10.5 K/uL   RBC 4.22 3.87 - 5.11 MIL/uL   Hemoglobin 13.0 12.0 - 15.0 g/dL   HCT 27.2 53.6 - 64.4 %   MCV 96.2 80.0 - 100.0 fL   MCH 30.8 26.0 - 34.0 pg   MCHC 32.0 30.0 - 36.0 g/dL   RDW 03.4 74.2 - 59.5 %   Platelets 304 150 - 400 K/uL   nRBC 0.0 0.0 - 0.2 %   Neutrophils Relative % 63 %  Neutro Abs 5.5 1.7 - 7.7 K/uL   Lymphocytes Relative 25 %   Lymphs Abs 2.2 0.7 - 4.0 K/uL   Monocytes Relative 9 %   Monocytes Absolute 0.8 0.1 - 1.0 K/uL   Eosinophils Relative 2 %   Eosinophils Absolute 0.2 0.0 - 0.5 K/uL   Basophils Relative 1 %   Basophils Absolute 0.0 0.0 - 0.1 K/uL   Immature Granulocytes 0 %   Abs Immature Granulocytes 0.02 0.00 - 0.07 K/uL  Troponin I (High Sensitivity)   Collection Time: 03/18/23 11:09 PM  Result Value Ref Range   Troponin I (High Sensitivity) <2 <18 ng/L  Urinalysis, w/ Reflex to Culture (Infection Suspected) -Urine, Clean Catch   Collection Time: 03/18/23 11:10 PM  Result Value Ref Range   Specimen Source URINE, CLEAN CATCH    Color, Urine STRAW (A) YELLOW   APPearance CLEAR CLEAR   Specific Gravity, Urine 1.008 1.005 - 1.030   pH 7.0 5.0 - 8.0   Glucose, UA NEGATIVE NEGATIVE mg/dL   Hgb urine dipstick LARGE (A) NEGATIVE   Bilirubin Urine NEGATIVE NEGATIVE    Ketones, ur NEGATIVE NEGATIVE mg/dL   Protein, ur NEGATIVE NEGATIVE mg/dL   Nitrite NEGATIVE NEGATIVE   Leukocytes,Ua NEGATIVE NEGATIVE   RBC / HPF >50 0 - 5 RBC/hpf   WBC, UA 6-10 0 - 5 WBC/hpf   Bacteria, UA NONE SEEN NONE SEEN   Squamous Epithelial / HPF 0-5 0 - 5 /HPF  Troponin I (High Sensitivity)   Collection Time: 03/19/23  6:00 AM  Result Value Ref Range   Troponin I (High Sensitivity) <2 <18 ng/L   US  ABDOMEN LIMITED WITH LIVER DOPPLER Result Date: 03/19/2023 CLINICAL DATA:  [id number] RUQ abdominal pain [id number] EXAM: ULTRASOUND ABDOMEN LIMITED RIGHT UPPER QUADRANT DUPLEX ULTRASOUND OF LIVER TECHNIQUE: Color and duplex Doppler ultrasound was performed to evaluate the hepatic in-flow and out-flow vessels. COMPARISON:  None Available. FINDINGS: Liver: Normal parenchymal echogenicity. Normal hepatic contour without nodularity. No focal lesion, mass or intrahepatic biliary ductal dilatation. Gallbladder: No gallstones or wall thickening visualized. No sonographic Murphy sign noted by sonographer. Main Portal Vein size: 1.3 cm cm Portal Vein Velocities Main Prox:  18 cm/sec Main Mid: 16 cm/sec Main Dist:  13 cm/sec Right: 12 cm/sec Left: 12 cm/sec Hepatic Vein Velocities Right:  24 cm/sec Middle:  38 cm/sec Left:  31 cm/sec IVC: Present and patent with normal respiratory phasicity. Hepatic Artery Velocity:  103 cm/sec Splenic Vein Velocity:  24 cm/sec Spleen: 11.6 x 3.6 x 3.8 cm (volume = 83 cm^3) (411 cm^3 is upper limit normal) Portal Vein Occlusion/Thrombus: No Splenic Vein Occlusion/Thrombus: No Ascites: None Varices: None IMPRESSION: 1. No acute sonographic within the RIGHT upper quadrant. 2. Normal Doppler evaluation of the liver Art Largo, MD [facility] Radiology Electronically Signed   By: Art Largo M.D.   On: 03/19/2023 09:03   DG Abdomen Acute W/Chest Result Date: 03/18/2023 CLINICAL DATA:  Abdominal pain.  Chest pain.  Recent  endoscopy. EXAM: DG ABDOMEN ACUTE WITH 1 VIEW CHEST COMPARISON:  CT 10/13/2021 FINDINGS: The cardiomediastinal contours are normal. The lungs are clear. No evidence of pneumomediastinum. There is no free intra-abdominal air. No dilated bowel loops to suggest obstruction. Moderate volume of stool throughout the colon. No radiopaque calculi. Coils in the left abdomen. No acute osseous abnormalities are seen. IMPRESSION: 1. No free intra-abdominal air or pneumomediastinum. 2. Moderate colonic stool burden. Electronically Signed   By: Alvina Axon.D.  On: 03/18/2023 23:28      Debbra Fairy, PA-C 03/19/23 1478    Carin Charleston, MD 03/19/23 [phone]

## 2023-03-19 NOTE — Telephone Encounter (Signed)
 MyChart message sent to patient with recommendations.

## 2023-03-19 NOTE — Telephone Encounter (Signed)
 Thank you for the update, and I reviewed the ED records. I agree that we should discontinue our order for ultrasound since it was done in the ED.  We had a lengthy discussion after her recent EGD.  She has had an extensive workup over about the last 18 months without visible explanation for this pain.  We had a lengthy discussion after her recent EGD and I recommended she have another visit with Dr. Dorrie Gaudier at [facility] surgery.  I agree with Rennie that we do not know if a gallbladder removal will relieve her pain since her gallbladder testing is all been negative so far.  That said, perhaps at least a diagnostic laparoscopy should be considered. As much as I may like to help, there is no identified cause that I am able to treat.  Memory Staggers, MD

## 2023-03-19 NOTE — Discharge Instructions (Addendum)
 You have been evaluated for your symptoms.  Fortunately ultrasound as well as labs today did not show any concerning finding.  Please take MiraLAX  to increase fiber which helps with regulating your bowel movement. Follow-up closely with your GI specialist for outpatient evaluation of your symptoms.

## 2023-03-20 ENCOUNTER — Encounter (HOSPITAL_COMMUNITY): Payer: Self-pay

## 2023-03-20 ENCOUNTER — Ambulatory Visit (HOSPITAL_COMMUNITY): Payer: 59

## 2023-04-03 ENCOUNTER — Ambulatory Visit: Payer: 59 | Admitting: Gastroenterology

## 2023-04-18 ENCOUNTER — Ambulatory Visit
Admission: RE | Admit: 2023-04-18 | Discharge: 2023-04-18 | Disposition: A | Discharge status: Home / Self Care | Payer: 59 | Source: Ambulatory Visit | Attending: Internal Medicine | Admitting: Internal Medicine

## 2023-04-18 VITALS — BP 107/63 | HR 97 | Temp 98.0°F | Resp 17

## 2023-04-18 DIAGNOSIS — J45909 Unspecified asthma, uncomplicated: Secondary | ICD-10-CM | POA: Insufficient documentation

## 2023-04-18 DIAGNOSIS — J029 Acute pharyngitis, unspecified: Secondary | ICD-10-CM | POA: Diagnosis present

## 2023-04-18 DIAGNOSIS — J069 Acute upper respiratory infection, unspecified: Secondary | ICD-10-CM | POA: Diagnosis not present

## 2023-04-18 DIAGNOSIS — R519 Headache, unspecified: Secondary | ICD-10-CM | POA: Diagnosis present

## 2023-04-18 DIAGNOSIS — R509 Fever, unspecified: Secondary | ICD-10-CM | POA: Diagnosis present

## 2023-04-18 LAB — POC COVID19/FLU A&B COMBO
Covid Antigen, POC: NEGATIVE
Influenza A Antigen, POC: NEGATIVE
Influenza B Antigen, POC: NEGATIVE

## 2023-04-18 LAB — POCT RAPID STREP A (OFFICE): Rapid Strep A Screen: NEGATIVE

## 2023-04-18 MED ORDER — PROMETHAZINE-DM 6.25-15 MG/5ML PO SYRP: 5.0000 mL | ORAL_SOLUTION | Freq: Every evening | ORAL | 0 refills | Status: DC | PRN

## 2023-04-18 NOTE — ED Provider Notes (Signed)
[name]    CSN: [id number] Arrival date & time: 04/18/23  1049      History   Chief Complaint Chief Complaint  Patient presents with   Sore Throat    Fever, headache, body aches, congestion - Entered by patient    HPI Megan Lara is a 43 y.o. female.   Megan Lara is a 43 y.o. female presenting for chief complaint of sore throat, fever, chills, body aches, and cough that started yesterday. Max temp at home 100.1. Her daughter is sick with similar symptoms. Cough is minimally productive. Sore throat is worsened by swallowing, coughing. History of asthma that is usually well controlled with as needed use of albuterol inhaler. She has not needed to use inhaler during this illness. Denies CP, SOB, N/V/D, abdominal pain, rash, and recent antibiotic/steroid use. Former smoker. No recent antibiotic/steroid use. Taking OTC medicines with some relief of fever.    Sore Throat    Past Medical History:  Diagnosis Date   Adjustment disorder with mixed anxiety and depressed mood 12/17/2016   Anemia    Anemia affecting pregnancy, antepartum 12/17/2016   Asthma    Chorioamnionitis, delivered, current hospitalization 12/17/2016   Depression    Hx of cellulitis of skin with lymphangitis    from last CS   Hypothyroidism 12/17/2016   Thyroid dysfunction in pregnancy in third trimester     Patient Active Problem List   Diagnosis Date Noted   Dysuria 12/27/2022   Complicated UTI (urinary tract infection) 07/01/2019   Fever 06/26/2019   Leukocytosis 06/26/2019   Sepsis (HCC) 06/26/2019   Meningitis 03/23/2019   Hematoma of rectus sheath 05/28/2018   Status post repeat low transverse cesarean section and BTL 05/25/2018   Postpartum care following cesarean delivery (3/23) 05/25/2018   Asthma 07/20/2017   Anxiety 07/15/2017   Depressive disorder 07/15/2017   Carpal tunnel syndrome, left upper limb 01/13/2017   Carpal tunnel syndrome, right upper  limb 01/13/2017   Hypothyroidism 12/17/2016   Adjustment disorder with mixed anxiety and depressed mood 12/17/2016   Foreign body (FB) in soft tissue 10/16/2015    Past Surgical History:  Procedure Laterality Date   CESAREAN SECTION N/A 12/16/2016   Procedure: CESAREAN SECTION;  Surgeon: Shea Evans, MD;  Location: [facility];  Service: Obstetrics;  Laterality: N/A;   CESAREAN SECTION WITH BILATERAL TUBAL LIGATION Bilateral 05/25/2018   Procedure: Repeat CESAREAN SECTION WITH BILATERAL TUBAL LIGATION;  Surgeon: Shea Evans, MD;  Location: [facility];  Service: Obstetrics;  Laterality: Bilateral;  EDD: 05/22/18   EXTRACORPOREAL SHOCK WAVE LITHOTRIPSY Right 06/24/2019   Procedure: EXTRACORPOREAL SHOCK WAVE LITHOTRIPSY (ESWL);  Surgeon: Ihor Gully, MD;  Location: [facility];  Service: Urology;  Laterality: Right;  75 MINS   GANGLION CYST EXCISION Left 4/15   3 rd toe   HERNIA REPAIR Right 1991   IR ANGIOGRAM PELVIS SELECTIVE OR SUPRASELECTIVE  05/28/2018   IR ANGIOGRAM SELECTIVE EACH ADDITIONAL VESSEL  05/28/2018   IR ANGIOGRAM SELECTIVE EACH ADDITIONAL VESSEL  05/28/2018   IR ANGIOGRAM SELECTIVE EACH ADDITIONAL VESSEL  05/28/2018   IR EMBO ART  VEN HEMORR LYMPH EXTRAV  INC GUIDE ROADMAPPING  05/28/2018   IR US GUIDE VASC ACCESS RIGHT  05/28/2018   TEE WITHOUT CARDIOVERSION N/A 06/30/2019   Procedure: TRANSESOPHAGEAL ECHOCARDIOGRAM (TEE);  Surgeon: Chrystie Nose, MD;  Location: [facility] ENDOSCOPY;  Service: Cardiovascular;  Laterality: N/A;   TONSILLECTOMY  1997    OB History  Gravida  2   Para  2   Term  2   Preterm      AB      Living  1      SAB      IAB      Ectopic      Multiple  0   Live Births  1            Home Medications    Prior to Admission medications   Medication Sig Start Date End Date Taking? Authorizing Provider  promethazine-dextromethorphan (PROMETHAZINE-DM) 6.25-15 MG/5ML syrup Take 5 mLs by mouth at bedtime as  needed for cough. 04/18/23  Yes Carlisle Beers, FNP  albuterol (PROVENTIL HFA;VENTOLIN HFA) 108 (90 BASE) MCG/ACT inhaler Inhale 2 puffs into the lungs every 6 (six) hours as needed for wheezing.    [provider]  buPROPion (WELLBUTRIN) 100 MG tablet Take 100 mg by mouth 2 (two) times daily. Patient not taking: Reported on 03/14/2023 10/16/15   [provider]  busPIRone (BUSPAR) 10 MG tablet Take 10 mg by mouth 2 (two) times daily. Patient not taking: Reported on 04/18/2023    [provider]  cetirizine (ZYRTEC) 10 MG tablet Take 10 mg by mouth at bedtime.    [provider]  ferrous sulfate 325 (65 FE) MG EC tablet Take 325 mg by mouth daily with breakfast.    [provider]  hyoscyamine (LEVSIN SL) 0.125 MG SL tablet Place 1 tablet (0.125 mg total) under the tongue every 6 (six) hours as needed. 03/14/23   Sherrilyn Rist, MD  levothyroxine (SYNTHROID, LEVOTHROID) 75 MCG tablet Take 75 mcg by mouth every other day. Last taken: .    [provider]  polyethylene glycol powder (GLYCOLAX/MIRALAX) 17 GM/SCOOP powder Take 17 g by mouth daily. 03/19/23   Fayrene Helper, PA-C  sertraline (ZOLOFT) 100 MG tablet Take 100 mg by mouth at bedtime.    [provider]    Family History Family History  Adopted: Yes  Problem Relation Age of Onset   Uterine cancer Maternal Grandmother     Social History Social History   Tobacco Use   Smoking status: Former    Current packs/day: 0.00    Average packs/day: 0.5 packs/day for 7.0 years (3.5 ttl pk-yrs)    Types: Cigarettes    Start date: 03/04/2006    Quit date: 03/04/2013    Years since quitting: 10.1   Smokeless tobacco: Never   Tobacco comments:    does some vape  Vaping Use   Vaping status: Never Used  Substance Use Topics   Alcohol use: Not Currently    Comment: Weekly.   Drug use: No     Allergies   Keflex [cephalexin] and Nitrofurantoin   Review of  Systems Review of Systems Per HPI  Physical Exam Triage Vital Signs ED Triage Vitals  Encounter Vitals Group     BP 04/18/23 1059 107/63     Systolic BP Percentile --      Diastolic BP Percentile --      Pulse Rate 04/18/23 1059 97     Resp 04/18/23 1059 17     Temp 04/18/23 1059 98 F (36.7 C)     Temp Source 04/18/23 1059 Oral     SpO2 04/18/23 1059 97 %     Weight --      Height --      Head Circumference --      Peak Flow --  Pain Score 04/18/23 1057 4     Pain Loc --      Pain Education --      Exclude from Growth Chart --    No data found.  Updated Vital Signs BP 107/63 (BP Location: Right Arm)   Pulse 97   Temp 98 F (36.7 C) (Oral)   Resp 17   LMP 03/17/2023 (Exact Date)   SpO2 97%   Visual Acuity Right Eye Distance:   Left Eye Distance:   Bilateral Distance:    Right Eye Near:   Left Eye Near:    Bilateral Near:     Physical Exam Vitals and nursing note reviewed.  Constitutional:      Appearance: She is not ill-appearing or toxic-appearing.  HENT:     Head: Normocephalic and atraumatic.     Right Ear: Hearing, tympanic membrane, ear canal and external ear normal.     Left Ear: Hearing, tympanic membrane, ear canal and external ear normal.     Nose: Congestion present.     Mouth/Throat:     Lips: Pink.     Mouth: Mucous membranes are moist. No injury or oral lesions.     Dentition: Normal dentition.     Tongue: No lesions.     Pharynx: Oropharynx is clear. Uvula midline. Posterior oropharyngeal erythema present. No pharyngeal swelling, oropharyngeal exudate, uvula swelling or postnasal drip.     Tonsils: No tonsillar exudate. 1+ on the right. 1+ on the left.     Comments: No trismus, phonation normal, maintaining secretions without difficulty.   Eyes:     General: Lids are normal. Vision grossly intact. Gaze aligned appropriately.     Extraocular Movements: Extraocular movements intact.     Conjunctiva/sclera: Conjunctivae normal.  Neck:      Trachea: Trachea and phonation normal.  Cardiovascular:     Rate and Rhythm: Normal rate and regular rhythm.     Heart sounds: Normal heart sounds, S1 normal and S2 normal.  Pulmonary:     Effort: Pulmonary effort is normal. No respiratory distress.     Breath sounds: Normal breath sounds and air entry. No wheezing, rhonchi or rales.  Chest:     Chest wall: No tenderness.  Musculoskeletal:     Cervical back: Neck supple.  Lymphadenopathy:     Cervical: No cervical adenopathy.  Skin:    General: Skin is warm and dry.     Capillary Refill: Capillary refill takes less than 2 seconds.     Findings: No rash.  Neurological:     General: No focal deficit present.     Mental Status: She is alert and oriented to person, place, and time. Mental status is at baseline.     Cranial Nerves: No dysarthria or facial asymmetry.  Psychiatric:        Mood and Affect: Mood normal.        Speech: Speech normal.        Behavior: Behavior normal.        Thought Content: Thought content normal.        Judgment: Judgment normal.      UC Treatments / Results  Labs (all labs ordered are listed, but only abnormal results are displayed) Labs Reviewed  SARS CORONAVIRUS 2 (TAT 6-24 HRS)  CULTURE, GROUP A STREP ([facility])  POC COVID19/FLU A&B COMBO  POCT RAPID STREP A (OFFICE)    EKG   Radiology No results found.  Procedures Procedures (including critical care time)  Medications Ordered  in UC Medications - No data to display  Initial Impression / Assessment and Plan / UC Course  I have reviewed the triage vital signs and the nursing notes.  Pertinent labs & imaging results that were available during my care of the patient were reviewed by me and considered in my medical decision making (see chart for details).   1. Viral URI with cough Suspect viral URI, viral syndrome.  Strep/viral testing: POC Flu, COVID, and strep testing negative. Throat culture pending. PCR COVID testing pending.   She is a candidate for antiviral if positive.  Child has strep throat.  Physical exam findings reassuring, vital signs hemodynamically stable, and lungs clear, therefore deferred imaging of the chest.  Advised supportive care/prescriptions for symptomatic relief as outlined in AVS.    Counseled patient on potential for adverse effects with medications prescribed/recommended today, strict ER and return-to-clinic precautions discussed, patient verbalized understanding.    Final Clinical Impressions(s) / UC Diagnoses   Final diagnoses:  Viral URI with cough     Discharge Instructions      You have a viral illness which will improve on its own with rest, fluids, and medications to help with your symptoms.  Tylenol, guaifenesin (plain mucinex), and saline nasal sprays may help relieve symptoms.  Promethazine DM at bedtime as needed for cough.  Two teaspoons of honey in 1 cup of warm water every 4-6 hours may help with throat pains.  Humidifier in room at nighttime may help soothe cough (clean well daily).   For chest pain, shortness of breath, inability to keep food or fluids down without vomiting, fever that does not respond to tylenol or motrin, or any other severe symptoms, please go to the ER for further evaluation. Return to urgent care as needed, otherwise follow-up with PCP.     ED Prescriptions     Medication Sig Dispense Auth. Provider   promethazine-dextromethorphan (PROMETHAZINE-DM) 6.25-15 MG/5ML syrup Take 5 mLs by mouth at bedtime as needed for cough. 118 mL Carlisle Beers, FNP      PDMP not reviewed this encounter.   Carlisle Beers, [place] 04/18/23 1156

## 2023-04-18 NOTE — Discharge Instructions (Addendum)
You have a viral illness which will improve on its own with rest, fluids, and medications to help with your symptoms.  Tylenol, guaifenesin (plain mucinex), and saline nasal sprays may help relieve symptoms.  Promethazine DM at bedtime as needed for cough.  Two teaspoons of honey in 1 cup of warm water every 4-6 hours may help with throat pains.  Humidifier in room at nighttime may help soothe cough (clean well daily).   For chest pain, shortness of breath, inability to keep food or fluids down without vomiting, fever that does not respond to tylenol or motrin, or any other severe symptoms, please go to the ER for further evaluation. Return to urgent care as needed, otherwise follow-up with PCP.

## 2023-04-18 NOTE — ED Triage Notes (Signed)
Pt presents with sore throat, body aches, fever, and headache since last night

## 2023-04-19 LAB — SARS CORONAVIRUS 2 (TAT 6-24 HRS): SARS Coronavirus 2: NEGATIVE

## 2023-04-21 ENCOUNTER — Telehealth: Payer: 59 | Admitting: Physician Assistant

## 2023-04-21 DIAGNOSIS — H6502 Acute serous otitis media, left ear: Secondary | ICD-10-CM | POA: Diagnosis not present

## 2023-04-21 LAB — CULTURE, GROUP A STREP (THRC)

## 2023-04-21 MED ORDER — AMOXICILLIN-POT CLAVULANATE 875-125 MG PO TABS: 1.0000 | ORAL_TABLET | Freq: Two times a day (BID) | ORAL | 0 refills | Status: DC

## 2023-04-21 NOTE — Progress Notes (Signed)

## 2023-04-24 ENCOUNTER — Ambulatory Visit: Payer: 59 | Admitting: Pediatrics

## 2023-05-02 ENCOUNTER — Other Ambulatory Visit: Payer: Self-pay | Admitting: General Surgery

## 2023-05-06 LAB — SURGICAL PATHOLOGY

## 2023-06-16 ENCOUNTER — Telehealth: Admitting: Nurse Practitioner

## 2023-06-16 DIAGNOSIS — J40 Bronchitis, not specified as acute or chronic: Secondary | ICD-10-CM | POA: Diagnosis not present

## 2023-06-16 MED ORDER — ALBUTEROL SULFATE HFA 108 (90 BASE) MCG/ACT IN AERS: 2.0000 | INHALATION_SPRAY | Freq: Four times a day (QID) | RESPIRATORY_TRACT | 0 refills | Status: AC | PRN

## 2023-06-16 MED ORDER — PREDNISONE 20 MG PO TABS: 20.0000 mg | ORAL_TABLET | Freq: Every day | ORAL | 0 refills | Status: AC

## 2023-06-16 MED ORDER — DOXYCYCLINE HYCLATE 100 MG PO TABS: 100.0000 mg | ORAL_TABLET | Freq: Two times a day (BID) | ORAL | 0 refills | Status: AC

## 2023-06-16 NOTE — Progress Notes (Signed)
 E-Visit for Cough   We are sorry that you are not feeling well.  Here is how we plan to help!  Based on your presentation I believe you most likely have A cough due to bacteria.  When patients have a fever and a productive cough with a change in color or increased sputum production, we are concerned about bacterial bronchitis.  If left untreated it can progress to pneumonia.  If your symptoms do not improve with your treatment plan it is important that you contact your provider.   I have prescribed Doxycycline 100 mg twice a day for 7 days     In addition you may use A non-prescription cough medication called Mucinex DM: take 2 tablets every 12 hours.  Prednisone 20 mg daily for 5 days  We will also refill your inhaler for use as needed  Meds ordered this encounter  Medications   doxycycline (VIBRA-TABS) 100 MG tablet    Sig: Take 1 tablet (100 mg total) by mouth 2 (two) times daily for 7 days.    Dispense:  14 tablet    Refill:  0   predniSONE (DELTASONE) 20 MG tablet    Sig: Take 1 tablet (20 mg total) by mouth daily with breakfast for 5 days.    Dispense:  5 tablet    Refill:  0   albuterol (VENTOLIN HFA) 108 (90 Base) MCG/ACT inhaler    Sig: Inhale 2 puffs into the lungs every 6 (six) hours as needed for wheezing or shortness of breath.    Dispense:  8 g    Refill:  0      From your responses in the eVisit questionnaire you describe inflammation in the upper respiratory tract which is causing a significant cough.  This is commonly called Bronchitis and has four common causes:   Allergies Viral Infections Acid Reflux Bacterial Infection Allergies, viruses and acid reflux are treated by controlling symptoms or eliminating the cause. An example might be a cough caused by taking certain blood pressure medications. You stop the cough by changing the medication. Another example might be a cough caused by acid reflux. Controlling the reflux helps control the cough.  USE OF  BRONCHODILATOR ("RESCUE") INHALERS: There is a risk from using your bronchodilator too frequently.  The risk is that over-reliance on a medication which only relaxes the muscles surrounding the breathing tubes can reduce the effectiveness of medications prescribed to reduce swelling and congestion of the tubes themselves.  Although you feel brief relief from the bronchodilator inhaler, your asthma may actually be worsening with the tubes becoming more swollen and filled with mucus.  This can delay other crucial treatments, such as oral steroid medications. If you need to use a bronchodilator inhaler daily, several times per day, you should discuss this with your provider.  There are probably better treatments that could be used to keep your asthma under control.     HOME CARE Only take medications as instructed by your medical team. Complete the entire course of an antibiotic. Drink plenty of fluids and get plenty of rest. Avoid close contacts especially the very young and the elderly Cover your mouth if you cough or cough into your sleeve. Always remember to wash your hands A steam or ultrasonic humidifier can help congestion.   GET HELP RIGHT AWAY IF: You develop worsening fever. You become short of breath You cough up blood. Your symptoms persist after you have completed your treatment plan MAKE SURE YOU  Understand these  instructions. Will watch your condition. Will get help right away if you are not doing well or get worse.    Thank you for choosing an e-visit.  Your e-visit answers were reviewed by a board certified advanced clinical practitioner to complete your personal care plan. Depending upon the condition, your plan could have included both over the counter or prescription medications.  Please review your pharmacy choice. Make sure the pharmacy is open so you can pick up prescription now. If there is a problem, you may contact your provider through [employer] and have  the prescription routed to another pharmacy.  Your safety is important to us . If you have drug allergies check your prescription carefully.   For the next 24 hours you can use MyChart to ask questions about today's visit, request a non-urgent call back, or ask for a work or school excuse. You will get an email in the next two days asking about your experience. I hope that your e-visit has been valuable and will speed your recovery.  I spent approximately 5 minutes reviewing the patient's history, current symptoms and coordinating their care today.

## 2023-07-13 ENCOUNTER — Other Ambulatory Visit: Payer: Self-pay | Admitting: Nurse Practitioner

## 2023-07-13 DIAGNOSIS — J40 Bronchitis, not specified as acute or chronic: Secondary | ICD-10-CM

## 2023-07-18 ENCOUNTER — Telehealth: Admitting: Nurse Practitioner

## 2023-07-18 DIAGNOSIS — J329 Chronic sinusitis, unspecified: Secondary | ICD-10-CM

## 2023-07-18 DIAGNOSIS — B9789 Other viral agents as the cause of diseases classified elsewhere: Secondary | ICD-10-CM

## 2023-07-18 MED ORDER — IPRATROPIUM BROMIDE 0.03 % NA SOLN: 2.0000 | Freq: Two times a day (BID) | NASAL | 12 refills | Status: DC

## 2023-07-18 NOTE — Progress Notes (Signed)
 E-Visit for Sinus Problems  We are sorry that you are not feeling well.  Here is how we plan to help!  You have been on a lot of antibiotics in the past year, we encourage you to be consistent with over the counter management of your symptoms and daily allergy medicine in an attempt to avoid the use of antibiotics.   Providers prescribe antibiotics to treat infections caused by bacteria. Antibiotics are very powerful in treating bacterial infections when they are used properly. To maintain their effectiveness, they should be used only when necessary. Overuse of antibiotics has resulted in the development of superbugs that are resistant to treatment!    After careful review of your answers, I would not recommend an antibiotic for your condition.  Antibiotics are not effective against viruses and therefore should not be used to treat them. Common examples of infections caused by viruses include colds and flu  Based on what you have shared with me it looks like you have sinusitis.  Sinusitis is inflammation and infection in the sinus cavities of the head.  Based on your presentation I believe you most likely have Acute Viral Sinusitis.This is an infection most likely caused by a virus. There is not specific treatment for viral sinusitis other than to help you with the symptoms until the infection runs its course.  You may use an oral decongestant such as Mucinex  D or if you have glaucoma or high blood pressure use plain Mucinex . Saline nasal spray help and can safely be used as often as needed for congestion, I have prescribed: Ipratropium Bromide  nasal spray 0.03% 2 sprays in eah nostril 2-3 times a day  Some authorities believe that zinc sprays or the use of Echinacea may shorten the course of your symptoms.  Sinus infections are not as easily transmitted as other respiratory infection, however we still recommend that you avoid close contact with loved ones, especially the very young and elderly.   Remember to wash your hands thoroughly throughout the day as this is the number one way to prevent the spread of infection!  Home Care: Only take medications as instructed by your medical team. Do not take these medications with alcohol. A steam or ultrasonic humidifier can help congestion.  You can place a towel over your head and breathe in the steam from hot water coming from a faucet. Avoid close contacts especially the very young and the elderly. Cover your mouth when you cough or sneeze. Always remember to wash your hands.  Get Help Right Away If: You develop worsening fever or sinus pain. You develop a severe head ache or visual changes. Your symptoms persist after you have completed your treatment plan.  Make sure you Understand these instructions. Will watch your condition. Will get help right away if you are not doing well or get worse.   Thank you for choosing an e-visit.  Your e-visit answers were reviewed by a board certified advanced clinical practitioner to complete your personal care plan. Depending upon the condition, your plan could have included both over the counter or prescription medications.  Please review your pharmacy choice. Make sure the pharmacy is open so you can pick up prescription now. If there is a problem, you may contact your provider through [employer] and have the prescription routed to another pharmacy.  Your safety is important to us . If you have drug allergies check your prescription carefully.   For the next 24 hours you can use MyChart to ask questions  about today's visit, request a non-urgent call back, or ask for a work or school excuse. You will get an email in the next two days asking about your experience. I hope that your e-visit has been valuable and will speed your recovery.    I spent approximately 5 minutes reviewing the patient's history, current symptoms and coordinating their care today.

## 2023-10-25 ENCOUNTER — Ambulatory Visit
Admission: RE | Admit: 2023-10-25 | Discharge: 2023-10-25 | Disposition: A | Discharge status: Home / Self Care | Attending: Physician Assistant

## 2023-10-25 ENCOUNTER — Other Ambulatory Visit: Payer: Self-pay

## 2023-10-25 VITALS — BP 105/69 | HR 61 | Temp 98.3°F | Resp 17 | Wt 148.0 lb

## 2023-10-25 DIAGNOSIS — R0989 Other specified symptoms and signs involving the circulatory and respiratory systems: Secondary | ICD-10-CM | POA: Diagnosis not present

## 2023-10-25 LAB — POC SOFIA SARS ANTIGEN FIA: SARS Coronavirus 2 Ag: NEGATIVE

## 2023-10-25 NOTE — ED Triage Notes (Addendum)
 Pt presents with complaints of sore throat, headaches, fatigue, low grade fevers, generalized body aches, and tightness in chest. Symptoms have been present for approximately two days. Family member in same household tested COVID+ yesterday. Currently rates overall pain a 4/10. Hx of asthma. Albuterol  inhaler used this morning with little improvement in breathing/pressure in chest. OTC Ibuprofen  + Cold & Flu (with Tylenol ) also taken with little relief in symptoms.

## 2023-10-25 NOTE — ED Provider Notes (Signed)
 [facility]    CSN: [id number] Arrival date & time: 10/25/23  9051      History   Chief Complaint Chief Complaint  Patient presents with   Generalized Body Aches    Congestion, low grade fever - Entered by patient    HPI Megan Lara is a 43 y.o. female.   HPI  Pt is here for generalized malaise, and upper respiratory symptoms She reports body aches started about 2 days ago and yesterday she started to feel worse She reports having sinus pressure, nasal congestion, sore throat, intermittent dry coughing, chest tightness, mild nausea  She denies n/v/d, SOB, wheezing  She reports previous hx of asthma  She tried using her inhaler to help with chest tightness this AM - she reports some improvement with this  She reports her son recently tested positive or COVID and her mother was sick with similar symptoms prior to others getting ill. Her husband came with her for separate apt with congestion, headaches and mild coughing   Interventions: Ibuprofen  and multisymptom cold medication started last night and taken this AM    Past Medical History:  Diagnosis Date   Adjustment disorder with mixed anxiety and depressed mood 12/17/2016   Anemia    Anemia affecting pregnancy, antepartum 12/17/2016   Asthma    Chorioamnionitis, delivered, current hospitalization 12/17/2016   Depression    Hx of cellulitis of skin with lymphangitis    from last CS   Hypothyroidism 12/17/2016   Thyroid  dysfunction in pregnancy in third trimester     Patient Active Problem List   Diagnosis Date Noted   Dysuria 12/27/2022   Complicated UTI (urinary tract infection) 07/01/2019   Fever 06/26/2019   Leukocytosis 06/26/2019   Sepsis (HCC) 06/26/2019   Meningitis 03/23/2019   Hematoma of rectus sheath 05/28/2018   Status post repeat low transverse cesarean section and BTL 05/25/2018   Postpartum care following cesarean delivery (3/23) 05/25/2018   Asthma 07/20/2017   Anxiety  07/15/2017   Depressive disorder 07/15/2017   Carpal tunnel syndrome, left upper limb 01/13/2017   Carpal tunnel syndrome, right upper limb 01/13/2017   Hypothyroidism 12/17/2016   Adjustment disorder with mixed anxiety and depressed mood 12/17/2016   Foreign body (FB) in soft tissue 10/16/2015    Past Surgical History:  Procedure Laterality Date   CESAREAN SECTION N/A 12/16/2016   Procedure: CESAREAN SECTION;  Surgeon: Barbette Knock, MD;  Location: [facility] BIR[facility]ING SU[facility];  Service: Obstetrics;  Laterality: N/A;   CESAREAN SECTION WITH BILATERAL TUBAL LIGATION Bilateral 05/25/2018   Procedure: Repeat CESAREAN SECTION WITH BILATERAL TUBAL LIGATION;  Surgeon: Barbette Knock, MD;  Location: [facility] LD ORS;  Service: Obstetrics;  Laterality: Bilateral;  EDD: 05/22/18   EXTRACORPOREAL SHOCK WAVE LITHOTRIPSY Right 06/24/2019   Procedure: EXTRACORPOREAL SHOCK WAVE LITHOTRIPSY (ESWL);  Surgeon: Ottelin, Mark, MD;  Location: [facility];  Service: Urology;  Laterality: Right;  75 MINS   GANGLION CYST EXCISION Left 4/15   3 rd toe   HERNIA REPAIR Right 1991   IR ANGIOGRAM PELVIS SELECTIVE OR SUPRASELECTIVE  05/28/2018   IR ANGIOGRAM SELECTIVE EACH ADDITIONAL VESSEL  05/28/2018   IR ANGIOGRAM SELECTIVE EACH ADDITIONAL VESSEL  05/28/2018   IR ANGIOGRAM SELECTIVE EACH ADDITIONAL VESSEL  05/28/2018   IR EMBO ART  VEN HEMORR LYMPH EXTRAV  INC GUIDE ROADMAPPING  05/28/2018   IR US  GUIDE VASC ACCESS RIGHT  05/28/2018   TEE WITHOUT CARDIOVERSION N/A 06/30/2019   Procedure: TRANSESOPHAGEAL ECHOCARDIOGRAM (TEE);  Surgeon: Mona Vinie BROCKS, MD;  Location: [facility] ENDOSCOPY;  Service: Cardiovascular;  Laterality: N/A;   TONSILLECTOMY  1997    OB History     Gravida  2   Para  2   Term  2   Preterm      AB      Living  1      SAB      IAB      Ectopic      Multiple  0   Live Births  1            Home Medications    Prior to Admission medications   Medication Sig Start Date End Date  Taking? Authorizing Provider  albuterol  (PROVENTIL  HFA;VENTOLIN  HFA) 108 (90 BASE) MCG/ACT inhaler Inhale 2 puffs into the lungs every 6 (six) hours as needed for wheezing.    [provider]  albuterol  (VENTOLIN  HFA) 108 (90 Base) MCG/ACT inhaler Inhale 2 puffs into the lungs every 6 (six) hours as needed for wheezing or shortness of breath. 06/16/23   Kennyth Domino, FNP  amoxicillin -clavulanate (AUGMENTIN ) 875-125 MG tablet Take 1 tablet by mouth 2 (two) times daily. 04/21/23   Vivienne Delon HERO, PA-C  buPROPion (WELLBUTRIN) 100 MG tablet Take 100 mg by mouth 2 (two) times daily. Patient not taking: Reported on 03/14/2023 10/16/15   [provider]  busPIRone  (BUSPAR ) 10 MG tablet Take 10 mg by mouth 2 (two) times daily. Patient not taking: Reported on 04/18/2023    [provider]  cetirizine  (ZYRTEC ) 10 MG tablet Take 10 mg by mouth at bedtime.    [provider]  ferrous sulfate 325 (65 FE) MG EC tablet Take 325 mg by mouth daily with breakfast.    [provider]  hyoscyamine  (LEVSIN  SL) 0.125 MG SL tablet Place 1 tablet (0.125 mg total) under the tongue every 6 (six) hours as needed. 03/14/23   Legrand Victory CROME III, MD  ipratropium (ATROVENT ) 0.03 % nasal spray Place 2 sprays into both nostrils every 12 (twelve) hours. 07/18/23   Kennyth Domino, FNP  levothyroxine  (SYNTHROID , LEVOTHROID) 75 MCG tablet Take 75 mcg by mouth every other day. Last taken: 75mcg.    [provider]  promethazine -dextromethorphan (PROMETHAZINE -DM) 6.25-15 MG/5ML syrup Take 5 mLs by mouth at bedtime as needed for cough. 04/18/23   Enedelia Dorna HERO, FNP  sertraline  (ZOLOFT ) 100 MG tablet Take 100 mg by mouth at bedtime.    [provider]    Family History Family History  Adopted: Yes  Problem Relation Age of Onset   Uterine cancer Maternal Grandmother     Social History Social History   Tobacco Use   Smoking status: Former    Current packs/day:  0.00    Average packs/day: 0.5 packs/day for 7.0 years (3.5 ttl pk-yrs)    Types: Cigarettes    Start date: 03/04/2006    Quit date: 03/04/2013    Years since quitting: 10.6   Smokeless tobacco: Never   Tobacco comments:    does some vape  Vaping Use   Vaping status: Never Used  Substance Use Topics   Alcohol use: Not Currently    Comment: Weekly.   Drug use: No     Allergies   Keflex  [cephalexin ] and Nitrofurantoin    Review of Systems Review of Systems  Constitutional:  Positive for chills, fatigue and fever (subjective fever last night).  HENT:  Positive for rhinorrhea, sinus pressure and sore throat. Negative for congestion and ear  pain.   Respiratory:  Positive for cough and chest tightness. Negative for shortness of breath and wheezing.   Gastrointestinal:  Positive for nausea. Negative for diarrhea and vomiting.  Musculoskeletal:  Positive for myalgias.     Physical Exam Triage Vital Signs ED Triage Vitals  Encounter Vitals Group     BP 10/25/23 0953 105/69     Girls Systolic BP Percentile --      Girls Diastolic BP Percentile --      Boys Systolic BP Percentile --      Boys Diastolic BP Percentile --      Pulse Rate 10/25/23 0953 61     Resp 10/25/23 0953 17     Temp 10/25/23 0953 98.3 F (36.8 C)     Temp Source 10/25/23 0953 Oral     SpO2 10/25/23 0953 98 %     Weight 10/25/23 0953 148 lb (67.1 kg)     Height --      Head Circumference --      Peak Flow --      Pain Score 10/25/23 1009 4     Pain Loc --      Pain Education --      Exclude from Growth Chart --    No data found.  Updated Vital Signs BP 105/69 (BP Location: Right Arm)   Pulse 61   Temp 98.3 F (36.8 C) (Oral)   Resp 17   Wt 148 lb (67.1 kg)   SpO2 98%   BMI 23.18 kg/m   Visual Acuity Right Eye Distance:   Left Eye Distance:   Bilateral Distance:    Right Eye Near:   Left Eye Near:    Bilateral Near:     Physical Exam Vitals reviewed.  Constitutional:      General: She  is awake.     Appearance: Normal appearance. She is well-developed and well-groomed.  HENT:     Head: Normocephalic and atraumatic.     Right Ear: Hearing, tympanic membrane and ear canal normal.     Left Ear: Hearing, tympanic membrane and ear canal normal.     Mouth/Throat:     Lips: Pink.     Mouth: Mucous membranes are moist.     Pharynx: Oropharynx is clear. Uvula midline. No pharyngeal swelling, oropharyngeal exudate, posterior oropharyngeal erythema, uvula swelling or postnasal drip.  Cardiovascular:     Rate and Rhythm: Normal rate and regular rhythm.     Pulses: Normal pulses.          Radial pulses are 2+ on the right side and 2+ on the left side.     Heart sounds: Normal heart sounds. No murmur heard.    No friction rub. No gallop.  Pulmonary:     Effort: Pulmonary effort is normal.     Breath sounds: Normal breath sounds. No decreased air movement. No decreased breath sounds, wheezing, rhonchi or rales.  Musculoskeletal:     Cervical back: Normal range of motion and neck supple.  Lymphadenopathy:     Head:     Right side of head: No submental, submandibular or preauricular adenopathy.     Left side of head: No submental, submandibular or preauricular adenopathy.     Cervical:     Right cervical: No superficial cervical adenopathy.    Left cervical: No superficial cervical adenopathy.     Upper Body:     Right upper body: No supraclavicular adenopathy.     Left upper body: No supraclavicular  adenopathy.  Neurological:     Mental Status: She is alert.  Psychiatric:        Mood and Affect: Mood normal.        Behavior: Behavior normal. Behavior is cooperative.        Thought Content: Thought content normal.        Judgment: Judgment normal.      UC Treatments / Results  Labs (all labs ordered are listed, but only abnormal results are displayed) Labs Reviewed  POC SOFIA SARS ANTIGEN FIA    EKG   Radiology No results found.  Procedures Procedures  (including critical care time)  Medications Ordered in UC Medications - No data to display  Initial Impression / Assessment and Plan / UC Course  I have reviewed the triage vital signs and the nursing notes.  Pertinent labs & imaging results that were available during my care of the patient were reviewed by me and considered in my medical decision making (see chart for details).     Pt declines breathing treatment and antiemetic medication script today   Final Clinical Impressions(s) / UC Diagnoses   Final diagnoses:  Symptoms of upper respiratory infection (URI)   Visit with patient indicates symptoms comprised of intermittent dry coughing, rhinorrhea, body aches, nausea for the past 2 days congruent with acute URI that is likely viral in nature  Patient has tested negative for COVID in clinic today.  Reviewed with patient that it may be too early to be testing for viral illness based on timeline of symptoms and she should recheck over the next 1 to 2 days for more definitive rule out.  Physical exam and vitals are reassuring today. Offered breathing treatment and script for zofran  but patient declined these. Due to nature and duration of symptoms recommended treatment regimen is symptomatic relief and follow up if needed Discussed with patient the various viral and bacterial etiologies of current illness and appropriate course of treatment Discussed OTC medication options for multisymptom relief such as Dayquil/Nyquil, Theraflu, AlkaSeltzer, etc. Discussed return precautions if symptoms are not improving or worsen over next 5-7 days.      Discharge Instructions      Your testing was negative for COVID today.  Based on your described symptoms and the duration of symptoms it is likely that you have a viral upper respiratory infection (often called a cold)  Symptoms can last for 3-10 days with lingering cough and intermittent symptoms potentially  lasting several  weeks after  that.  The goal of treatment at this time is to reduce your symptoms and discomfort   You can use the following medications and measures to help yourself feel better until your body fights this off: DayQuil/NyQuil, TheraFlu, Alka-Seltzer  (these medications typically have the same active ingredients in them so you can choose whichever one you prefer and take consistently during the day and night according to the manufactures instructions.) Flonase  A daily antihistamine such as Zyrtec , Claritin , Allegra per your preference.  Please choose 1 and take consistently. Increased fluids.  It is recommended that you take in at least 64 ounces of water per day when you are not sick so it is important to increase this when you are sick and your body may be running fever. Rest Cough drops Chloraseptic throat spray to help with sore throat Nasal saline spray or nasal flushes to help with congestion and runny nose  If your symptoms seem like they are getting worse over the next 5 to  7 days or not improving you can always follow-up here in urgent care or go to your primary care provider for further management. Go to the ER if you begin to have more serious symptoms such as shortness of breath, trouble breathing, loss of consciousness, swelling around the eyes, high fever, severe lasting headaches, vision changes or neck pain/stiffness.       ED Prescriptions   None    PDMP not reviewed this encounter.   Marylene Rocky BRAVO, PA-C 10/25/23 1128

## 2023-10-25 NOTE — Discharge Instructions (Signed)
 Your testing was negative for COVID today.  Based on your described symptoms and the duration of symptoms it is likely that you have a viral upper respiratory infection (often called a cold)  Symptoms can last for 3-10 days with lingering cough and intermittent symptoms potentially  lasting several  weeks after that.  The goal of treatment at this time is to reduce your symptoms and discomfort   You can use the following medications and measures to help yourself feel better until your body fights this off: DayQuil/NyQuil, TheraFlu, Alka-Seltzer  (these medications typically have the same active ingredients in them so you can choose whichever one you prefer and take consistently during the day and night according to the manufactures instructions.) Flonase  A daily antihistamine such as Zyrtec , Claritin , Allegra per your preference.  Please choose 1 and take consistently. Increased fluids.  It is recommended that you take in at least 64 ounces of water per day when you are not sick so it is important to increase this when you are sick and your body may be running fever. Rest Cough drops Chloraseptic throat spray to help with sore throat Nasal saline spray or nasal flushes to help with congestion and runny nose  If your symptoms seem like they are getting worse over the next 5 to 7 days or not improving you can always follow-up here in urgent care or go to your primary care provider for further management. Go to the ER if you begin to have more serious symptoms such as shortness of breath, trouble breathing, loss of consciousness, swelling around the eyes, high fever, severe lasting headaches, vision changes or neck pain/stiffness.

## 2023-12-02 ENCOUNTER — Telehealth: Admitting: Physician Assistant

## 2023-12-02 DIAGNOSIS — R062 Wheezing: Secondary | ICD-10-CM | POA: Diagnosis not present

## 2023-12-02 DIAGNOSIS — J208 Acute bronchitis due to other specified organisms: Secondary | ICD-10-CM

## 2023-12-02 DIAGNOSIS — B9689 Other specified bacterial agents as the cause of diseases classified elsewhere: Secondary | ICD-10-CM | POA: Diagnosis not present

## 2023-12-02 MED ORDER — AZITHROMYCIN 250 MG PO TABS: ORAL_TABLET | ORAL | 0 refills | Status: AC

## 2023-12-02 MED ORDER — PREDNISONE 20 MG PO TABS: 40.0000 mg | ORAL_TABLET | Freq: Every day | ORAL | 0 refills | Status: AC

## 2023-12-02 MED ORDER — BENZONATATE 100 MG PO CAPS: 100.0000 mg | ORAL_CAPSULE | Freq: Three times a day (TID) | ORAL | 0 refills | Status: AC | PRN

## 2023-12-02 NOTE — Progress Notes (Signed)
 I have spent 5 minutes in review of e-visit questionnaire, review and updating patient chart, medical decision making and response to patient.   Elsie Velma Lunger, PA-C

## 2023-12-02 NOTE — Progress Notes (Signed)
 E-Visit for Cough   We are sorry that you are not feeling well.  Here is how we plan to help!  Based on your presentation I believe you most likely have A cough due to bacteria.  When patients have a fever and a productive cough with a change in color or increased sputum production, we are concerned about bacterial bronchitis.  If left untreated it can progress to pneumonia.  If your symptoms do not improve with your treatment plan it is important that you contact your provider.   I have prescribed Azithromyin 250 mg: two tablets now and then one tablet daily for 4 additonal days    In addition you may use A prescription cough medication called Tessalon  Perles 100mg . You may take 1-2 capsules every 8 hours as needed for your cough.  I have also sent in a short course of prednisone  due to wheezing. Continue use of your albuterol  inhaler.   From your responses in the eVisit questionnaire you describe inflammation in the upper respiratory tract which is causing a significant cough.  This is commonly called Bronchitis and has four common causes:   Allergies Viral Infections Acid Reflux Bacterial Infection Allergies, viruses and acid reflux are treated by controlling symptoms or eliminating the cause. An example might be a cough caused by taking certain blood pressure medications. You stop the cough by changing the medication. Another example might be a cough caused by acid reflux. Controlling the reflux helps control the cough.  USE OF BRONCHODILATOR (RESCUE) INHALERS: There is a risk from using your bronchodilator too frequently.  The risk is that over-reliance on a medication which only relaxes the muscles surrounding the breathing tubes can reduce the effectiveness of medications prescribed to reduce swelling and congestion of the tubes themselves.  Although you feel brief relief from the bronchodilator inhaler, your asthma may actually be worsening with the tubes becoming more swollen and filled  with mucus.  This can delay other crucial treatments, such as oral steroid medications. If you need to use a bronchodilator inhaler daily, several times per day, you should discuss this with your provider.  There are probably better treatments that could be used to keep your asthma under control.     HOME CARE Only take medications as instructed by your medical team. Complete the entire course of an antibiotic. Drink plenty of fluids and get plenty of rest. Avoid close contacts especially the very young and the elderly Cover your mouth if you cough or cough into your sleeve. Always remember to wash your hands A steam or ultrasonic humidifier can help congestion.   GET HELP RIGHT AWAY IF: You develop worsening fever. You become short of breath You cough up blood. Your symptoms persist after you have completed your treatment plan MAKE SURE YOU  Understand these instructions. Will watch your condition. Will get help right away if you are not doing well or get worse.    Thank you for choosing an e-visit.  Your e-visit answers were reviewed by a board certified advanced clinical practitioner to complete your personal care plan. Depending upon the condition, your plan could have included both over the counter or prescription medications.  Please review your pharmacy choice. Make sure the pharmacy is open so you can pick up prescription now. If there is a problem, you may contact your provider through [employer] and have the prescription routed to another pharmacy.  Your safety is important to us . If you have drug allergies check your prescription  carefully.   For the next 24 hours you can use MyChart to ask questions about today's visit, request a non-urgent call back, or ask for a work or school excuse. You will get an email in the next two days asking about your experience. I hope that your e-visit has been valuable and will speed your recovery.

## 2024-03-03 ENCOUNTER — Other Ambulatory Visit: Payer: Self-pay

## 2024-03-03 ENCOUNTER — Ambulatory Visit
Admission: RE | Admit: 2024-03-03 | Discharge: 2024-03-03 | Disposition: A | Discharge status: Home / Self Care | Attending: Physician Assistant | Admitting: Physician Assistant

## 2024-03-03 VITALS — BP 120/66 | HR 100 | Temp 99.1°F | Resp 18 | Ht 67.0 in | Wt 147.9 lb

## 2024-03-03 DIAGNOSIS — J029 Acute pharyngitis, unspecified: Secondary | ICD-10-CM | POA: Diagnosis not present

## 2024-03-03 DIAGNOSIS — R0989 Other specified symptoms and signs involving the circulatory and respiratory systems: Secondary | ICD-10-CM

## 2024-03-03 LAB — POCT INFLUENZA A/B
Influenza A, POC: NEGATIVE
Influenza B, POC: NEGATIVE

## 2024-03-03 LAB — POC SOFIA SARS ANTIGEN FIA: SARS Coronavirus 2 Ag: NEGATIVE

## 2024-03-03 LAB — POCT RAPID STREP A (OFFICE): Rapid Strep A Screen: NEGATIVE

## 2024-03-03 MED ORDER — LIDOCAINE VISCOUS HCL 2 % MT SOLN: 15.0000 mL | OROMUCOSAL | 0 refills | Status: AC | PRN

## 2024-03-03 NOTE — ED Provider Notes (Signed)
 " [facility]    CSN: [id number] Arrival date & time: 03/03/24  1403      History   Chief Complaint Chief Complaint  Patient presents with   Sore Throat    Sudden onset of severe sore throat 1 day ago. Sore neck under jaw line on both sides and pulsating. Low grade fever and body aches. - Entered by patient    HPI Megan Lara is a 43 y.o. female.   HPI  Pt is here today with concerns for sore throat, difficulty swallowing, body aches, nasal congestion. She reports sore throat started about 2 days ago and this AM she woke up with nasal congestion and body aches. She reports abdominal cramping but denies nausea, vomiting or diarrhea.  She reports her children had a viral illness but did not have sore throat.   Interventions: Tylenol  and ibuprofen , Dayquil and Nyquil. She also tried using throat spray to help with pain    Past Medical History:  Diagnosis Date   Adjustment disorder with mixed anxiety and depressed mood 12/17/2016   Anemia    Anemia affecting pregnancy, antepartum 12/17/2016   Asthma    Chorioamnionitis, delivered, current hospitalization 12/17/2016   Depression    Hx of cellulitis of skin with lymphangitis    from last CS   Hypothyroidism 12/17/2016   Thyroid  dysfunction in pregnancy in third trimester     Patient Active Problem List   Diagnosis Date Noted   Dysuria 12/27/2022   Complicated UTI (urinary tract infection) 07/01/2019   Fever 06/26/2019   Leukocytosis 06/26/2019   Sepsis (HCC) 06/26/2019   Meningitis 03/23/2019   Hematoma of rectus sheath 05/28/2018   Status post repeat low transverse cesarean section and BTL 05/25/2018   Postpartum care following cesarean delivery (3/23) 05/25/2018   Asthma 07/20/2017   Anxiety 07/15/2017   Depressive disorder 07/15/2017   Carpal tunnel syndrome, left upper limb 01/13/2017   Carpal tunnel syndrome, right upper limb 01/13/2017   Hypothyroidism 12/17/2016   Adjustment disorder  with mixed anxiety and depressed mood 12/17/2016   Foreign body (FB) in soft tissue 10/16/2015    Past Surgical History:  Procedure Laterality Date   CESAREAN SECTION N/A 12/16/2016   Procedure: CESAREAN SECTION;  Surgeon: Barbette Knock, MD;  Location: [facility] BIRTHING SUITES;  Service: Obstetrics;  Laterality: N/A;   CESAREAN SECTION WITH BILATERAL TUBAL LIGATION Bilateral 05/25/2018   Procedure: Repeat CESAREAN SECTION WITH BILATERAL TUBAL LIGATION;  Surgeon: Barbette Knock, MD;  Location: [facility] LD ORS;  Service: Obstetrics;  Laterality: Bilateral;  EDD: 05/22/18   EXTRACORPOREAL SHOCK WAVE LITHOTRIPSY Right 06/24/2019   Procedure: EXTRACORPOREAL SHOCK WAVE LITHOTRIPSY (ESWL);  Surgeon: Ottelin, Mark, MD;  Location: [facility];  Service: Urology;  Laterality: Right;  75 MINS   GANGLION CYST EXCISION Left 4/15   3 rd toe   HERNIA REPAIR Right 1991   IR ANGIOGRAM PELVIS SELECTIVE OR SUPRASELECTIVE  05/28/2018   IR ANGIOGRAM SELECTIVE EACH ADDITIONAL VESSEL  05/28/2018   IR ANGIOGRAM SELECTIVE EACH ADDITIONAL VESSEL  05/28/2018   IR ANGIOGRAM SELECTIVE EACH ADDITIONAL VESSEL  05/28/2018   IR EMBO ART  VEN HEMORR LYMPH EXTRAV  INC GUIDE ROADMAPPING  05/28/2018   IR US  GUIDE VASC ACCESS RIGHT  05/28/2018   TEE WITHOUT CARDIOVERSION N/A 06/30/2019   Procedure: TRANSESOPHAGEAL ECHOCARDIOGRAM (TEE);  Surgeon: Mona Vinie BROCKS, MD;  Location: [facility] ENDOSCOPY;  Service: Cardiovascular;  Laterality: N/A;   TONSILLECTOMY  1997    OB History  Gravida  2   Para  2   Term  2   Preterm      AB      Living  1      SAB      IAB      Ectopic      Multiple  0   Live Births  1            Home Medications    Prior to Admission medications  Medication Sig Start Date End Date Taking? Authorizing Provider  lidocaine  (XYLOCAINE ) 2 % solution Use as directed 15 mLs in the mouth or throat as needed. 03/03/24  Yes Francella Barnett E, PA-C  albuterol  (VENTOLIN  HFA) 108 (90 Base) MCG/ACT  inhaler Inhale 2 puffs into the lungs every 6 (six) hours as needed for wheezing or shortness of breath. 06/16/23   Kennyth Domino, FNP  benzonatate  (TESSALON ) 100 MG capsule Take 1 capsule (100 mg total) by mouth 3 (three) times daily as needed for cough. 12/02/23   Gladis Elsie BROCKS, PA-C  buPROPion (WELLBUTRIN) 100 MG tablet Take 100 mg by mouth 2 (two) times daily. Patient not taking: Reported on 03/14/2023 10/16/15   [provider]  busPIRone  (BUSPAR ) 10 MG tablet Take 10 mg by mouth 2 (two) times daily. Patient not taking: Reported on 04/18/2023    [provider]  cetirizine  (ZYRTEC ) 10 MG tablet Take 10 mg by mouth at bedtime.    [provider]  ferrous sulfate 325 (65 FE) MG EC tablet Take 325 mg by mouth daily with breakfast.    [provider]  levothyroxine  (SYNTHROID , LEVOTHROID) 75 MCG tablet Take 75 mcg by mouth every other day. Last taken: 75mcg.    [provider]  predniSONE  (DELTASONE ) 20 MG tablet Take 2 tablets (40 mg total) by mouth daily with breakfast. 12/02/23   Gladis Elsie BROCKS, PA-C  sertraline  (ZOLOFT ) 100 MG tablet Take 100 mg by mouth at bedtime.    [provider]    Family History Family History  Adopted: Yes  Problem Relation Age of Onset   Uterine cancer Maternal Grandmother     Social History Social History[1]   Allergies   Keflex  [cephalexin ] and Nitrofurantoin    Review of Systems Review of Systems  Constitutional:  Positive for chills and fever.  HENT:  Positive for congestion, ear pain, postnasal drip, sore throat and trouble swallowing.   Respiratory:  Negative for cough, shortness of breath and wheezing.   Gastrointestinal:  Positive for abdominal pain (cramping). Negative for diarrhea, nausea and vomiting.  Musculoskeletal:  Positive for myalgias.     Physical Exam Triage Vital Signs ED Triage Vitals  Encounter Vitals Group     BP 03/03/24 1526 120/66     Girls Systolic BP Percentile  --      Girls Diastolic BP Percentile --      Boys Systolic BP Percentile --      Boys Diastolic BP Percentile --      Pulse Rate 03/03/24 1526 100     Resp 03/03/24 1526 18     Temp 03/03/24 1526 99.1 F (37.3 C)     Temp Source 03/03/24 1526 Oral     SpO2 03/03/24 1526 100 %     Weight 03/03/24 1528 147 lb 14.9 oz (67.1 kg)     Height 03/03/24 1528 5' 7 (1.702 m)     Head Circumference --      Peak Flow --  Pain Score 03/03/24 1528 7     Pain Loc --      Pain Education --      Exclude from Growth Chart --    No data found.  Updated Vital Signs BP 120/66 (BP Location: Right Arm)   Pulse 100   Temp 99.1 F (37.3 C) (Oral)   Resp 18   Ht 5' 7 (1.702 m)   Wt 147 lb 14.9 oz (67.1 kg)   SpO2 100%   BMI 23.17 kg/m   Visual Acuity Right Eye Distance:   Left Eye Distance:   Bilateral Distance:    Right Eye Near:   Left Eye Near:    Bilateral Near:     Physical Exam Vitals reviewed.  Constitutional:      General: She is awake. She is not in acute distress.    Appearance: Normal appearance. She is well-developed and well-groomed. She is ill-appearing. She is not toxic-appearing or diaphoretic.  HENT:     Head: Normocephalic and atraumatic.     Right Ear: Hearing, tympanic membrane and ear canal normal.     Left Ear: Hearing, tympanic membrane and ear canal normal.     Mouth/Throat:     Lips: Pink.     Mouth: Mucous membranes are moist.     Pharynx: Oropharynx is clear. Uvula midline. Posterior oropharyngeal erythema and postnasal drip present. No pharyngeal swelling, oropharyngeal exudate or uvula swelling.     Tonsils: No tonsillar exudate or tonsillar abscesses. 0 on the right. 0 on the left.  Cardiovascular:     Rate and Rhythm: Normal rate and regular rhythm.     Pulses: Normal pulses.          Radial pulses are 2+ on the right side and 2+ on the left side.     Heart sounds: Normal heart sounds. No murmur heard.    No friction rub. No gallop.  Pulmonary:      Effort: Pulmonary effort is normal.     Breath sounds: Normal breath sounds. No decreased air movement. No decreased breath sounds, wheezing, rhonchi or rales.  Musculoskeletal:     Cervical back: Normal range of motion and neck supple.  Lymphadenopathy:     Head:     Right side of head: No submental, submandibular or preauricular adenopathy.     Left side of head: No submental, submandibular or preauricular adenopathy.     Cervical:     Right cervical: No superficial cervical adenopathy.    Left cervical: No superficial cervical adenopathy.     Upper Body:     Right upper body: No supraclavicular adenopathy.     Left upper body: No supraclavicular adenopathy.  Neurological:     General: No focal deficit present.     Mental Status: She is alert and oriented to person, place, and time.  Psychiatric:        Mood and Affect: Mood normal.        Behavior: Behavior normal. Behavior is cooperative.      UC Treatments / Results  Labs (all labs ordered are listed, but only abnormal results are displayed) Labs Reviewed  POCT INFLUENZA A/B  POC SOFIA SARS ANTIGEN FIA  POCT RAPID STREP A (OFFICE)    EKG   Radiology No results found.  Procedures Procedures (including critical care time)  Medications Ordered in UC Medications - No data to display  Initial Impression / Assessment and Plan / UC Course  I have reviewed the triage vital  signs and the nursing notes.  Pertinent labs & imaging results that were available during my care of the patient were reviewed by me and considered in my medical decision making (see chart for details).      Final Clinical Impressions(s) / UC Diagnoses   Final diagnoses:  Sore throat  Symptoms of upper respiratory infection (URI)   Patient presents today with concerns of sore throat, postnasal drainage, nasal congestion, ear pain, fever and chills that have been ongoing for about 2 days.  Rapid testing was negative for COVID, flu, strep.   Results discussed with patient during appointment.  Physical exam is notable for posterior oropharyngeal erythema and evidence of postnasal drip without signs of exudates or significant swelling.  Vitals show mildly elevated temperature of 99.1.  At this time I suspect likely viral etiology given the fact that her children were also sick recently.  Recommend optimizing OTC medications for symptomatic relief.  Will send viscous lidocaine  to assist with sore throat.  ED return precautions reviewed and provided in AVS.  Follow-up as needed.    Discharge Instructions      Your testing was negative for COVID, flu, strep.  Your physical exam is largely reassuring and there does not appear to be any airway compromise of your throat or severe swelling or white patches suggestive of strep.  Based on your described symptoms and the duration of symptoms it is likely that you have a viral upper respiratory infection (often called a cold)  Symptoms can last for 3-10 days with lingering cough and intermittent symptoms potentially  lasting several  weeks after that.  The goal of treatment at this time is to reduce your symptoms and discomfort   You can use the following medications and measures to help yourself feel better until your body fights this off: DayQuil/NyQuil, TheraFlu, Alka-Seltzer  (these medications typically have the same active ingredients in them so you can choose whichever one you prefer and take consistently during the day and night according to the manufactures instructions.) Flonase  A daily antihistamine such as Zyrtec , Claritin , Allegra per your preference.  Please choose 1 and take consistently. Increased fluids.  It is recommended that you take in at least 64 ounces of water per day when you are not sick so it is important to increase this when you are sick and your body may be running fever. Rest Cough drops Chloraseptic throat spray to help with sore throat Nasal saline spray or  nasal flushes to help with congestion and runny nose  To help with your symptoms I am sending in a prescription for a medication called viscous lidocaine .  This should provide some topical relief to your sore throat.  In addition to the viscous lidocaine  you can also use salt water gargles, alternate Tylenol  and ibuprofen , Chloraseptic throat spray.  If your symptoms seem like they are getting worse over the next 5 to 7 days or not improving you can always follow-up here in urgent care or go to your primary care provider for further management. Go to the ER if you begin to have more serious symptoms such as shortness of breath, trouble breathing, loss of consciousness, swelling around the eyes, high fever, severe lasting headaches, vision changes or neck pain/stiffness.       ED Prescriptions     Medication Sig Dispense Auth. Provider   lidocaine  (XYLOCAINE ) 2 % solution Use as directed 15 mLs in the mouth or throat as needed. 100 mL Reuben Knoblock E, PA-C  PDMP not reviewed this encounter.     [1]  Social History Tobacco Use   Smoking status: Former    Current packs/day: 0.00    Average packs/day: 0.5 packs/day for 7.0 years (3.5 ttl pk-yrs)    Types: Cigarettes    Start date: 03/04/2006    Quit date: 03/04/2013    Years since quitting: 11.0   Smokeless tobacco: Never   Tobacco comments:    does some vape  Vaping Use   Vaping status: Never Used  Substance Use Topics   Alcohol use: Not Currently    Comment: Weekly.   Drug use: No     Karen Kinnard, Rocky BRAVO, PA-C 03/03/24 1714  "

## 2024-03-03 NOTE — ED Triage Notes (Signed)
 Pt presents with a chief complaint of sore throat. Symptoms began about two days ago. Sore throat is accompanied with congestion and body aches. Currently rates overall pain a 7/10. Pointing to neck area, describes the pain in throat/neck area as pulsating. OTC Tylenol  taken at home with no improvement/relief. Child has been sick recently.

## 2024-03-03 NOTE — Discharge Instructions (Addendum)
 Your testing was negative for COVID, flu, strep.  Your physical exam is largely reassuring and there does not appear to be any airway compromise of your throat or severe swelling or white patches suggestive of strep.  Based on your described symptoms and the duration of symptoms it is likely that you have a viral upper respiratory infection (often called a cold)  Symptoms can last for 3-10 days with lingering cough and intermittent symptoms potentially  lasting several  weeks after that.  The goal of treatment at this time is to reduce your symptoms and discomfort   You can use the following medications and measures to help yourself feel better until your body fights this off: DayQuil/NyQuil, TheraFlu, Alka-Seltzer  (these medications typically have the same active ingredients in them so you can choose whichever one you prefer and take consistently during the day and night according to the manufactures instructions.) Flonase  A daily antihistamine such as Zyrtec , Claritin , Allegra per your preference.  Please choose 1 and take consistently. Increased fluids.  It is recommended that you take in at least 64 ounces of water per day when you are not sick so it is important to increase this when you are sick and your body may be running fever. Rest Cough drops Chloraseptic throat spray to help with sore throat Nasal saline spray or nasal flushes to help with congestion and runny nose  To help with your symptoms I am sending in a prescription for a medication called viscous lidocaine .  This should provide some topical relief to your sore throat.  In addition to the viscous lidocaine  you can also use salt water gargles, alternate Tylenol  and ibuprofen , Chloraseptic throat spray.  If your symptoms seem like they are getting worse over the next 5 to 7 days or not improving you can always follow-up here in urgent care or go to your primary care provider for further management. Go to the ER if you begin  to have more serious symptoms such as shortness of breath, trouble breathing, loss of consciousness, swelling around the eyes, high fever, severe lasting headaches, vision changes or neck pain/stiffness.
